# Patient Record
Sex: Female | Born: 1945
Health system: Southern US, Community
[De-identification: ages and names within clinical notes are randomized; demographics above are authoritative.]

## PROBLEM LIST (undated history)

## (undated) DIAGNOSIS — R350 Frequency of micturition: Secondary | ICD-10-CM

## (undated) DIAGNOSIS — R300 Dysuria: Secondary | ICD-10-CM

## (undated) DIAGNOSIS — R42 Dizziness and giddiness: Secondary | ICD-10-CM

## (undated) DIAGNOSIS — K219 Gastro-esophageal reflux disease without esophagitis: Secondary | ICD-10-CM

## (undated) DIAGNOSIS — K5909 Other constipation: Secondary | ICD-10-CM

## (undated) DIAGNOSIS — I889 Nonspecific lymphadenitis, unspecified: Principal | ICD-10-CM

## (undated) DIAGNOSIS — J069 Acute upper respiratory infection, unspecified: Secondary | ICD-10-CM

## (undated) DIAGNOSIS — M5136 Other intervertebral disc degeneration, lumbar region: Secondary | ICD-10-CM

## (undated) DIAGNOSIS — A318 Other mycobacterial infections: Secondary | ICD-10-CM

## (undated) DIAGNOSIS — Z973 Presence of spectacles and contact lenses: Secondary | ICD-10-CM

## (undated) DIAGNOSIS — M6748 Ganglion, other site: Secondary | ICD-10-CM

## (undated) DIAGNOSIS — R102 Pelvic and perineal pain unspecified side: Secondary | ICD-10-CM

## (undated) DIAGNOSIS — I712 Thoracic aortic aneurysm, without rupture, unspecified: Secondary | ICD-10-CM

## (undated) DIAGNOSIS — T8579XA Infection and inflammatory reaction due to other internal prosthetic devices, implants and grafts, initial encounter: Secondary | ICD-10-CM

## (undated) DIAGNOSIS — Z974 Presence of external hearing-aid: Secondary | ICD-10-CM

## (undated) DIAGNOSIS — F419 Anxiety disorder, unspecified: Secondary | ICD-10-CM

## (undated) DIAGNOSIS — M51369 Other intervertebral disc degeneration, lumbar region without mention of lumbar back pain or lower extremity pain: Secondary | ICD-10-CM

## (undated) DIAGNOSIS — J439 Emphysema, unspecified: Secondary | ICD-10-CM

## (undated) DIAGNOSIS — J410 Simple chronic bronchitis: Secondary | ICD-10-CM

## (undated) DIAGNOSIS — I10 Essential (primary) hypertension: Secondary | ICD-10-CM

## (undated) DIAGNOSIS — T792XXA Traumatic secondary and recurrent hemorrhage and seroma, initial encounter: Secondary | ICD-10-CM

## (undated) DIAGNOSIS — R3915 Urgency of urination: Secondary | ICD-10-CM

## (undated) DIAGNOSIS — T4145XA Adverse effect of unspecified anesthetic, initial encounter: Secondary | ICD-10-CM

## (undated) DIAGNOSIS — G43909 Migraine, unspecified, not intractable, without status migrainosus: Secondary | ICD-10-CM

## (undated) DIAGNOSIS — J449 Chronic obstructive pulmonary disease, unspecified: Secondary | ICD-10-CM

## (undated) DIAGNOSIS — I719 Aortic aneurysm of unspecified site, without rupture: Secondary | ICD-10-CM

## (undated) DIAGNOSIS — T8859XA Other complications of anesthesia, initial encounter: Secondary | ICD-10-CM

## (undated) DIAGNOSIS — M8580 Other specified disorders of bone density and structure, unspecified site: Secondary | ICD-10-CM

## (undated) DIAGNOSIS — M1990 Unspecified osteoarthritis, unspecified site: Secondary | ICD-10-CM

## (undated) HISTORY — DX: Nonspecific lymphadenitis, unspecified: I88.9

## (undated) HISTORY — DX: Acute upper respiratory infection, unspecified: J06.9

## (undated) HISTORY — DX: Other specified disorders of bone density and structure, unspecified site: M85.80

## (undated) HISTORY — PX: HERNIA REPAIR: SHX51

## (undated) HISTORY — DX: Chronic obstructive pulmonary disease, unspecified: J44.9

## (undated) HISTORY — DX: Anxiety disorder, unspecified: F41.9

## (undated) HISTORY — DX: Other intervertebral disc degeneration, lumbar region without mention of lumbar back pain or lower extremity pain: M51.369

## (undated) HISTORY — DX: Other intervertebral disc degeneration, lumbar region: M51.36

## (undated) HISTORY — PX: BREAST BIOPSY: SHX20

## (undated) HISTORY — PX: EYE SURGERY: SHX253

## (undated) HISTORY — PX: COLONOSCOPY: SHX174

## (undated) HISTORY — PX: OTHER SURGICAL HISTORY: SHX169

## (undated) HISTORY — DX: Ganglion, other site: M67.48

## (undated) HISTORY — DX: Unspecified osteoarthritis, unspecified site: M19.90

## (undated) HISTORY — DX: Gastro-esophageal reflux disease without esophagitis: K21.9

## (undated) HISTORY — DX: Essential (primary) hypertension: I10

## (undated) HISTORY — DX: Aortic aneurysm of unspecified site, without rupture: I71.9

## (undated) HISTORY — DX: Other mycobacterial infections: A31.8

---
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
## 1980-08-04 HISTORY — PX: ABDOMINAL HYSTERECTOMY: SHX81

## 1982-08-04 HISTORY — PX: OTHER SURGICAL HISTORY: SHX169

## 1985-08-04 HISTORY — PX: CHOLECYSTECTOMY OPEN: SUR202

## 1991-02-18 DIAGNOSIS — L570 Actinic keratosis: Secondary | ICD-10-CM

## 1991-02-18 HISTORY — DX: Actinic keratosis: L57.0

## 1995-04-07 DIAGNOSIS — C4491 Basal cell carcinoma of skin, unspecified: Secondary | ICD-10-CM

## 1995-04-07 HISTORY — DX: Basal cell carcinoma of skin, unspecified: C44.91

## 2000-05-26 ENCOUNTER — Encounter: Payer: Self-pay | Admitting: Family Medicine

## 2000-05-26 ENCOUNTER — Encounter: Admission: RE | Admit: 2000-05-26 | Discharge: 2000-05-26 | Payer: Self-pay | Admitting: Family Medicine

## 2000-10-07 ENCOUNTER — Encounter: Admission: RE | Admit: 2000-10-07 | Discharge: 2000-10-28 | Payer: Self-pay | Admitting: Internal Medicine

## 2000-10-16 ENCOUNTER — Ambulatory Visit (HOSPITAL_COMMUNITY): Admission: RE | Admit: 2000-10-16 | Discharge: 2000-10-16 | Payer: Self-pay | Admitting: Gastroenterology

## 2001-05-28 ENCOUNTER — Encounter: Payer: Self-pay | Admitting: Internal Medicine

## 2001-05-28 ENCOUNTER — Encounter: Admission: RE | Admit: 2001-05-28 | Discharge: 2001-05-28 | Payer: Self-pay | Admitting: Internal Medicine

## 2001-08-04 HISTORY — PX: ROTATOR CUFF REPAIR: SHX139

## 2002-05-13 ENCOUNTER — Encounter: Admission: RE | Admit: 2002-05-13 | Discharge: 2002-05-13 | Payer: Self-pay | Admitting: Internal Medicine

## 2002-05-13 ENCOUNTER — Encounter: Payer: Self-pay | Admitting: Internal Medicine

## 2002-06-16 ENCOUNTER — Encounter: Payer: Self-pay | Admitting: Internal Medicine

## 2002-06-16 ENCOUNTER — Encounter: Admission: RE | Admit: 2002-06-16 | Discharge: 2002-06-16 | Payer: Self-pay | Admitting: Internal Medicine

## 2003-04-24 ENCOUNTER — Encounter: Payer: Self-pay | Admitting: Internal Medicine

## 2003-04-24 ENCOUNTER — Encounter: Admission: RE | Admit: 2003-04-24 | Discharge: 2003-04-24 | Payer: Self-pay | Admitting: Internal Medicine

## 2003-08-29 ENCOUNTER — Encounter: Admission: RE | Admit: 2003-08-29 | Discharge: 2003-08-29 | Payer: Self-pay | Admitting: Internal Medicine

## 2003-12-14 ENCOUNTER — Encounter: Admission: RE | Admit: 2003-12-14 | Discharge: 2003-12-14 | Payer: Self-pay | Admitting: Gastroenterology

## 2004-09-26 ENCOUNTER — Encounter: Admission: RE | Admit: 2004-09-26 | Discharge: 2004-09-26 | Payer: Self-pay | Admitting: Internal Medicine

## 2005-10-30 ENCOUNTER — Encounter: Admission: RE | Admit: 2005-10-30 | Discharge: 2005-10-30 | Payer: Self-pay | Admitting: Emergency Medicine

## 2006-12-10 ENCOUNTER — Encounter: Admission: RE | Admit: 2006-12-10 | Discharge: 2006-12-10 | Payer: Self-pay | Admitting: Emergency Medicine

## 2007-12-13 ENCOUNTER — Encounter: Admission: RE | Admit: 2007-12-13 | Discharge: 2007-12-13 | Payer: Self-pay | Admitting: Emergency Medicine

## 2009-11-12 ENCOUNTER — Encounter: Admission: RE | Admit: 2009-11-12 | Discharge: 2009-11-12 | Payer: Self-pay | Admitting: Family Medicine

## 2009-12-04 DIAGNOSIS — D239 Other benign neoplasm of skin, unspecified: Secondary | ICD-10-CM

## 2009-12-04 HISTORY — DX: Other benign neoplasm of skin, unspecified: D23.9

## 2010-08-08 ENCOUNTER — Encounter
Admission: RE | Admit: 2010-08-08 | Discharge: 2010-08-08 | Payer: Self-pay | Source: Home / Self Care | Attending: Family Medicine | Admitting: Family Medicine

## 2010-08-26 ENCOUNTER — Encounter: Payer: Self-pay | Admitting: Family Medicine

## 2010-12-20 NOTE — Procedures (Signed)
[facility]  Patient:    CHIDERA, DEARCOS                       MRN: [id number] Proc. Date: 10/16/00 Adm. Date:  [id number] Attending:  Rich Brave CC:         Lilyan Punt. Sydnee Levans, M.D.   Procedure Report  PROCEDURE:  Colonoscopy.  INDICATIONS FOR PROCEDURE:  Screening for colon cancer in a 65 year old female.  FINDINGS:  Scattered left sided diverticulosis. Otherwise normal exam.  DESCRIPTION OF PROCEDURE:  The nature, purpose and risk of the procedure had been reviewed with the patient and she provided written consent. The Olympus adult video colonoscope was advanced with some difficulty to the cecum, primarily because of the formation of a loop about the time we were in the mid transverse colonic region. Initial attempts to get the loop out were unsuccessful but once we accomplished that, the advancement was quite easily accomplished to the cecum as identified by clear visualization of the appendiceal orifice. Pullback was then performed. There was a little bit of adherent stool film in the proximal colon but it is not felt that this would have obscured any major lesions.  There was no evidence of polyps, cancer, or colitis or vascular malformations on this exam but there was some left sided diverticulosis. Retroflexion of the rectum was normal. No biopsies were obtained. The patient tolerated the procedure well and there were no apparent complications.  IMPRESSION:  Left sided diverticulosis, otherwise normal screening colonoscopy.  PLAN:  Flexible sigmoidoscopy for screening purposes in five years, followed perhaps by a full colonoscopy five years thereafter. DD:  10/16/00 TD:  10/16/00 Job: [id number]

## 2011-01-16 ENCOUNTER — Other Ambulatory Visit: Payer: Self-pay | Admitting: Family Medicine

## 2011-01-16 DIAGNOSIS — Z1231 Encounter for screening mammogram for malignant neoplasm of breast: Secondary | ICD-10-CM

## 2011-01-23 ENCOUNTER — Ambulatory Visit
Admission: RE | Admit: 2011-01-23 | Discharge: 2011-01-23 | Disposition: A | Discharge status: Home / Self Care | Payer: Medicare Other | Source: Ambulatory Visit | Attending: Family Medicine | Admitting: Family Medicine

## 2011-01-23 DIAGNOSIS — Z1231 Encounter for screening mammogram for malignant neoplasm of breast: Secondary | ICD-10-CM

## 2011-02-17 ENCOUNTER — Other Ambulatory Visit: Payer: Self-pay | Admitting: Family Medicine

## 2011-02-17 DIAGNOSIS — R748 Abnormal levels of other serum enzymes: Secondary | ICD-10-CM

## 2011-02-21 ENCOUNTER — Ambulatory Visit
Admission: RE | Admit: 2011-02-21 | Discharge: 2011-02-21 | Disposition: A | Discharge status: Home / Self Care | Payer: Medicare Other | Source: Ambulatory Visit | Attending: Family Medicine | Admitting: Family Medicine

## 2011-02-21 DIAGNOSIS — R748 Abnormal levels of other serum enzymes: Secondary | ICD-10-CM

## 2011-03-25 ENCOUNTER — Ambulatory Visit: Payer: Medicare Other

## 2011-03-31 ENCOUNTER — Ambulatory Visit: Payer: Medicare Other | Attending: Family Medicine

## 2011-03-31 DIAGNOSIS — M6281 Muscle weakness (generalized): Secondary | ICD-10-CM | POA: Insufficient documentation

## 2011-03-31 DIAGNOSIS — R5381 Other malaise: Secondary | ICD-10-CM | POA: Insufficient documentation

## 2011-03-31 DIAGNOSIS — M545 Low back pain, unspecified: Secondary | ICD-10-CM | POA: Insufficient documentation

## 2011-03-31 DIAGNOSIS — IMO0001 Reserved for inherently not codable concepts without codable children: Secondary | ICD-10-CM | POA: Insufficient documentation

## 2011-04-02 ENCOUNTER — Encounter: Payer: Medicare Other | Admitting: Physical Therapy

## 2011-04-08 ENCOUNTER — Encounter: Payer: Medicare Other | Admitting: Physical Therapy

## 2011-04-10 ENCOUNTER — Ambulatory Visit: Payer: Medicare Other | Attending: Family Medicine

## 2011-04-10 DIAGNOSIS — M545 Low back pain, unspecified: Secondary | ICD-10-CM | POA: Insufficient documentation

## 2011-04-10 DIAGNOSIS — R5381 Other malaise: Secondary | ICD-10-CM | POA: Insufficient documentation

## 2011-04-10 DIAGNOSIS — M6281 Muscle weakness (generalized): Secondary | ICD-10-CM | POA: Insufficient documentation

## 2011-04-10 DIAGNOSIS — IMO0001 Reserved for inherently not codable concepts without codable children: Secondary | ICD-10-CM | POA: Insufficient documentation

## 2011-04-15 ENCOUNTER — Ambulatory Visit: Payer: Medicare Other

## 2011-04-17 ENCOUNTER — Ambulatory Visit: Payer: Medicare Other

## 2011-08-28 DIAGNOSIS — IMO0002 Reserved for concepts with insufficient information to code with codable children: Secondary | ICD-10-CM | POA: Diagnosis not present

## 2011-10-03 DIAGNOSIS — Z1211 Encounter for screening for malignant neoplasm of colon: Secondary | ICD-10-CM | POA: Diagnosis not present

## 2011-10-03 DIAGNOSIS — K219 Gastro-esophageal reflux disease without esophagitis: Secondary | ICD-10-CM | POA: Diagnosis not present

## 2011-10-03 DIAGNOSIS — R141 Gas pain: Secondary | ICD-10-CM | POA: Diagnosis not present

## 2011-10-03 DIAGNOSIS — K5901 Slow transit constipation: Secondary | ICD-10-CM | POA: Diagnosis not present

## 2011-10-03 DIAGNOSIS — K625 Hemorrhage of anus and rectum: Secondary | ICD-10-CM | POA: Diagnosis not present

## 2011-10-09 DIAGNOSIS — F411 Generalized anxiety disorder: Secondary | ICD-10-CM | POA: Diagnosis not present

## 2011-10-09 DIAGNOSIS — J209 Acute bronchitis, unspecified: Secondary | ICD-10-CM | POA: Diagnosis not present

## 2011-11-19 DIAGNOSIS — J029 Acute pharyngitis, unspecified: Secondary | ICD-10-CM | POA: Diagnosis not present

## 2011-12-10 DIAGNOSIS — K5730 Diverticulosis of large intestine without perforation or abscess without bleeding: Secondary | ICD-10-CM | POA: Diagnosis not present

## 2011-12-10 DIAGNOSIS — D126 Benign neoplasm of colon, unspecified: Secondary | ICD-10-CM | POA: Diagnosis not present

## 2011-12-10 DIAGNOSIS — K625 Hemorrhage of anus and rectum: Secondary | ICD-10-CM | POA: Diagnosis not present

## 2011-12-19 DIAGNOSIS — H6120 Impacted cerumen, unspecified ear: Secondary | ICD-10-CM | POA: Diagnosis not present

## 2011-12-19 DIAGNOSIS — R42 Dizziness and giddiness: Secondary | ICD-10-CM | POA: Diagnosis not present

## 2011-12-22 ENCOUNTER — Other Ambulatory Visit: Payer: Self-pay | Admitting: Family Medicine

## 2011-12-22 DIAGNOSIS — Z1231 Encounter for screening mammogram for malignant neoplasm of breast: Secondary | ICD-10-CM

## 2012-01-12 DIAGNOSIS — M549 Dorsalgia, unspecified: Secondary | ICD-10-CM | POA: Diagnosis not present

## 2012-01-12 DIAGNOSIS — M5137 Other intervertebral disc degeneration, lumbosacral region: Secondary | ICD-10-CM | POA: Diagnosis not present

## 2012-01-26 ENCOUNTER — Ambulatory Visit: Payer: Medicare Other

## 2012-01-29 ENCOUNTER — Other Ambulatory Visit: Payer: Self-pay | Admitting: Family Medicine

## 2012-01-29 ENCOUNTER — Ambulatory Visit
Admission: RE | Admit: 2012-01-29 | Discharge: 2012-01-29 | Disposition: A | Discharge status: Home / Self Care | Payer: Medicare Other | Source: Ambulatory Visit | Attending: Family Medicine | Admitting: Family Medicine

## 2012-01-29 DIAGNOSIS — Z1231 Encounter for screening mammogram for malignant neoplasm of breast: Secondary | ICD-10-CM

## 2012-01-29 DIAGNOSIS — N63 Unspecified lump in unspecified breast: Secondary | ICD-10-CM

## 2012-02-09 ENCOUNTER — Ambulatory Visit
Admission: RE | Admit: 2012-02-09 | Discharge: 2012-02-09 | Disposition: A | Discharge status: Home / Self Care | Payer: Medicare Other | Source: Ambulatory Visit | Attending: Family Medicine | Admitting: Family Medicine

## 2012-02-09 DIAGNOSIS — N63 Unspecified lump in unspecified breast: Secondary | ICD-10-CM

## 2012-02-09 DIAGNOSIS — N644 Mastodynia: Secondary | ICD-10-CM | POA: Diagnosis not present

## 2012-02-16 DIAGNOSIS — M25549 Pain in joints of unspecified hand: Secondary | ICD-10-CM | POA: Diagnosis not present

## 2012-02-16 DIAGNOSIS — M25579 Pain in unspecified ankle and joints of unspecified foot: Secondary | ICD-10-CM | POA: Diagnosis not present

## 2012-03-02 DIAGNOSIS — Z0000 Encounter for general adult medical examination without abnormal findings: Secondary | ICD-10-CM | POA: Diagnosis not present

## 2012-03-02 DIAGNOSIS — Z79899 Other long term (current) drug therapy: Secondary | ICD-10-CM | POA: Diagnosis not present

## 2012-03-02 DIAGNOSIS — J309 Allergic rhinitis, unspecified: Secondary | ICD-10-CM | POA: Diagnosis not present

## 2012-03-02 DIAGNOSIS — I10 Essential (primary) hypertension: Secondary | ICD-10-CM | POA: Diagnosis not present

## 2012-03-02 DIAGNOSIS — M949 Disorder of cartilage, unspecified: Secondary | ICD-10-CM | POA: Diagnosis not present

## 2012-03-02 DIAGNOSIS — M899 Disorder of bone, unspecified: Secondary | ICD-10-CM | POA: Diagnosis not present

## 2012-03-02 DIAGNOSIS — J45901 Unspecified asthma with (acute) exacerbation: Secondary | ICD-10-CM | POA: Diagnosis not present

## 2012-03-02 DIAGNOSIS — E559 Vitamin D deficiency, unspecified: Secondary | ICD-10-CM | POA: Diagnosis not present

## 2012-03-05 DIAGNOSIS — M12279 Villonodular synovitis (pigmented), unspecified ankle and foot: Secondary | ICD-10-CM | POA: Diagnosis not present

## 2012-03-05 DIAGNOSIS — M65979 Unspecified synovitis and tenosynovitis, unspecified ankle and foot: Secondary | ICD-10-CM | POA: Diagnosis not present

## 2012-03-05 DIAGNOSIS — M659 Synovitis and tenosynovitis, unspecified: Secondary | ICD-10-CM | POA: Diagnosis not present

## 2012-03-12 DIAGNOSIS — M12279 Villonodular synovitis (pigmented), unspecified ankle and foot: Secondary | ICD-10-CM | POA: Diagnosis not present

## 2012-03-12 DIAGNOSIS — M65979 Unspecified synovitis and tenosynovitis, unspecified ankle and foot: Secondary | ICD-10-CM | POA: Diagnosis not present

## 2012-03-12 DIAGNOSIS — M659 Synovitis and tenosynovitis, unspecified: Secondary | ICD-10-CM | POA: Diagnosis not present

## 2012-03-30 DIAGNOSIS — R079 Chest pain, unspecified: Secondary | ICD-10-CM | POA: Diagnosis not present

## 2012-04-20 DIAGNOSIS — Z23 Encounter for immunization: Secondary | ICD-10-CM | POA: Diagnosis not present

## 2012-05-04 DIAGNOSIS — K219 Gastro-esophageal reflux disease without esophagitis: Secondary | ICD-10-CM | POA: Diagnosis not present

## 2012-05-04 DIAGNOSIS — R072 Precordial pain: Secondary | ICD-10-CM | POA: Diagnosis not present

## 2012-05-04 DIAGNOSIS — Z86010 Personal history of colonic polyps: Secondary | ICD-10-CM | POA: Diagnosis not present

## 2012-06-17 DIAGNOSIS — K219 Gastro-esophageal reflux disease without esophagitis: Secondary | ICD-10-CM | POA: Diagnosis not present

## 2012-06-17 DIAGNOSIS — J329 Chronic sinusitis, unspecified: Secondary | ICD-10-CM | POA: Diagnosis not present

## 2012-06-17 DIAGNOSIS — G479 Sleep disorder, unspecified: Secondary | ICD-10-CM | POA: Diagnosis not present

## 2012-07-07 DIAGNOSIS — M25549 Pain in joints of unspecified hand: Secondary | ICD-10-CM | POA: Diagnosis not present

## 2012-07-07 DIAGNOSIS — M25519 Pain in unspecified shoulder: Secondary | ICD-10-CM | POA: Diagnosis not present

## 2012-07-10 DIAGNOSIS — J069 Acute upper respiratory infection, unspecified: Secondary | ICD-10-CM | POA: Diagnosis not present

## 2012-08-02 DIAGNOSIS — M545 Low back pain, unspecified: Secondary | ICD-10-CM | POA: Diagnosis not present

## 2012-08-13 ENCOUNTER — Other Ambulatory Visit: Payer: Self-pay | Admitting: Family Medicine

## 2012-08-13 DIAGNOSIS — M47817 Spondylosis without myelopathy or radiculopathy, lumbosacral region: Secondary | ICD-10-CM | POA: Diagnosis not present

## 2012-08-13 DIAGNOSIS — N951 Menopausal and female climacteric states: Secondary | ICD-10-CM

## 2012-08-20 ENCOUNTER — Ambulatory Visit
Admission: RE | Admit: 2012-08-20 | Discharge: 2012-08-20 | Disposition: A | Discharge status: Home / Self Care | Payer: Medicare Other | Source: Ambulatory Visit | Attending: Family Medicine | Admitting: Family Medicine

## 2012-08-20 DIAGNOSIS — M949 Disorder of cartilage, unspecified: Secondary | ICD-10-CM | POA: Diagnosis not present

## 2012-08-20 DIAGNOSIS — M899 Disorder of bone, unspecified: Secondary | ICD-10-CM | POA: Diagnosis not present

## 2012-08-20 DIAGNOSIS — N951 Menopausal and female climacteric states: Secondary | ICD-10-CM

## 2012-08-23 DIAGNOSIS — M545 Low back pain, unspecified: Secondary | ICD-10-CM | POA: Diagnosis not present

## 2012-08-30 DIAGNOSIS — H6120 Impacted cerumen, unspecified ear: Secondary | ICD-10-CM | POA: Diagnosis not present

## 2012-08-30 DIAGNOSIS — F17200 Nicotine dependence, unspecified, uncomplicated: Secondary | ICD-10-CM | POA: Diagnosis not present

## 2012-08-30 DIAGNOSIS — E559 Vitamin D deficiency, unspecified: Secondary | ICD-10-CM | POA: Diagnosis not present

## 2012-08-30 DIAGNOSIS — K219 Gastro-esophageal reflux disease without esophagitis: Secondary | ICD-10-CM | POA: Diagnosis not present

## 2012-08-30 DIAGNOSIS — J449 Chronic obstructive pulmonary disease, unspecified: Secondary | ICD-10-CM | POA: Diagnosis not present

## 2012-08-30 DIAGNOSIS — J309 Allergic rhinitis, unspecified: Secondary | ICD-10-CM | POA: Diagnosis not present

## 2012-09-22 DIAGNOSIS — R6889 Other general symptoms and signs: Secondary | ICD-10-CM | POA: Diagnosis not present

## 2012-10-06 DIAGNOSIS — J40 Bronchitis, not specified as acute or chronic: Secondary | ICD-10-CM | POA: Diagnosis not present

## 2012-10-27 DIAGNOSIS — G479 Sleep disorder, unspecified: Secondary | ICD-10-CM | POA: Diagnosis not present

## 2012-10-27 DIAGNOSIS — F411 Generalized anxiety disorder: Secondary | ICD-10-CM | POA: Diagnosis not present

## 2012-10-27 DIAGNOSIS — F17200 Nicotine dependence, unspecified, uncomplicated: Secondary | ICD-10-CM | POA: Diagnosis not present

## 2012-10-27 DIAGNOSIS — J209 Acute bronchitis, unspecified: Secondary | ICD-10-CM | POA: Diagnosis not present

## 2012-11-04 DIAGNOSIS — M7750 Other enthesopathy of unspecified foot: Secondary | ICD-10-CM | POA: Diagnosis not present

## 2012-11-04 DIAGNOSIS — M79609 Pain in unspecified limb: Secondary | ICD-10-CM | POA: Diagnosis not present

## 2012-11-20 DIAGNOSIS — L259 Unspecified contact dermatitis, unspecified cause: Secondary | ICD-10-CM | POA: Diagnosis not present

## 2012-12-03 DIAGNOSIS — L739 Follicular disorder, unspecified: Secondary | ICD-10-CM | POA: Diagnosis not present

## 2012-12-03 DIAGNOSIS — D485 Neoplasm of uncertain behavior of skin: Secondary | ICD-10-CM | POA: Diagnosis not present

## 2012-12-03 DIAGNOSIS — D239 Other benign neoplasm of skin, unspecified: Secondary | ICD-10-CM | POA: Diagnosis not present

## 2012-12-03 DIAGNOSIS — L820 Inflamed seborrheic keratosis: Secondary | ICD-10-CM | POA: Diagnosis not present

## 2012-12-06 DIAGNOSIS — M25579 Pain in unspecified ankle and joints of unspecified foot: Secondary | ICD-10-CM | POA: Diagnosis not present

## 2012-12-09 DIAGNOSIS — M25579 Pain in unspecified ankle and joints of unspecified foot: Secondary | ICD-10-CM | POA: Diagnosis not present

## 2012-12-16 DIAGNOSIS — M25579 Pain in unspecified ankle and joints of unspecified foot: Secondary | ICD-10-CM | POA: Diagnosis not present

## 2012-12-16 DIAGNOSIS — M545 Low back pain, unspecified: Secondary | ICD-10-CM | POA: Diagnosis not present

## 2012-12-23 DIAGNOSIS — M545 Low back pain, unspecified: Secondary | ICD-10-CM | POA: Diagnosis not present

## 2012-12-23 DIAGNOSIS — M25579 Pain in unspecified ankle and joints of unspecified foot: Secondary | ICD-10-CM | POA: Diagnosis not present

## 2012-12-30 DIAGNOSIS — J441 Chronic obstructive pulmonary disease with (acute) exacerbation: Secondary | ICD-10-CM | POA: Diagnosis not present

## 2012-12-30 DIAGNOSIS — J45909 Unspecified asthma, uncomplicated: Secondary | ICD-10-CM | POA: Diagnosis not present

## 2013-01-06 ENCOUNTER — Other Ambulatory Visit: Payer: Self-pay

## 2013-01-06 DIAGNOSIS — Z1231 Encounter for screening mammogram for malignant neoplasm of breast: Secondary | ICD-10-CM

## 2013-01-06 DIAGNOSIS — M25579 Pain in unspecified ankle and joints of unspecified foot: Secondary | ICD-10-CM | POA: Diagnosis not present

## 2013-01-07 DIAGNOSIS — G479 Sleep disorder, unspecified: Secondary | ICD-10-CM | POA: Diagnosis not present

## 2013-01-07 DIAGNOSIS — F17200 Nicotine dependence, unspecified, uncomplicated: Secondary | ICD-10-CM | POA: Diagnosis not present

## 2013-01-07 DIAGNOSIS — J449 Chronic obstructive pulmonary disease, unspecified: Secondary | ICD-10-CM | POA: Diagnosis not present

## 2013-01-07 DIAGNOSIS — F411 Generalized anxiety disorder: Secondary | ICD-10-CM | POA: Diagnosis not present

## 2013-02-10 ENCOUNTER — Ambulatory Visit: Payer: Medicare Other

## 2013-02-11 DIAGNOSIS — N390 Urinary tract infection, site not specified: Secondary | ICD-10-CM | POA: Diagnosis not present

## 2013-02-11 DIAGNOSIS — R350 Frequency of micturition: Secondary | ICD-10-CM | POA: Diagnosis not present

## 2013-02-18 DIAGNOSIS — R300 Dysuria: Secondary | ICD-10-CM | POA: Diagnosis not present

## 2013-02-23 DIAGNOSIS — N3020 Other chronic cystitis without hematuria: Secondary | ICD-10-CM | POA: Diagnosis not present

## 2013-02-23 DIAGNOSIS — R300 Dysuria: Secondary | ICD-10-CM | POA: Diagnosis not present

## 2013-02-23 DIAGNOSIS — N949 Unspecified condition associated with female genital organs and menstrual cycle: Secondary | ICD-10-CM | POA: Diagnosis not present

## 2013-02-24 ENCOUNTER — Ambulatory Visit
Admission: RE | Admit: 2013-02-24 | Discharge: 2013-02-24 | Disposition: A | Discharge status: Home / Self Care | Payer: Medicare Other | Source: Ambulatory Visit

## 2013-02-24 DIAGNOSIS — Z1231 Encounter for screening mammogram for malignant neoplasm of breast: Secondary | ICD-10-CM | POA: Diagnosis not present

## 2013-03-06 DIAGNOSIS — R05 Cough: Secondary | ICD-10-CM | POA: Diagnosis not present

## 2013-03-06 DIAGNOSIS — R059 Cough, unspecified: Secondary | ICD-10-CM | POA: Diagnosis not present

## 2013-03-14 DIAGNOSIS — Z0000 Encounter for general adult medical examination without abnormal findings: Secondary | ICD-10-CM | POA: Diagnosis not present

## 2013-03-14 DIAGNOSIS — N760 Acute vaginitis: Secondary | ICD-10-CM | POA: Diagnosis not present

## 2013-03-14 DIAGNOSIS — E559 Vitamin D deficiency, unspecified: Secondary | ICD-10-CM | POA: Diagnosis not present

## 2013-03-14 DIAGNOSIS — I10 Essential (primary) hypertension: Secondary | ICD-10-CM | POA: Diagnosis not present

## 2013-03-14 DIAGNOSIS — Z79899 Other long term (current) drug therapy: Secondary | ICD-10-CM | POA: Diagnosis not present

## 2013-04-13 DIAGNOSIS — M5030 Other cervical disc degeneration, unspecified cervical region: Secondary | ICD-10-CM | POA: Diagnosis not present

## 2013-04-13 DIAGNOSIS — M542 Cervicalgia: Secondary | ICD-10-CM | POA: Diagnosis not present

## 2013-04-13 DIAGNOSIS — M9981 Other biomechanical lesions of cervical region: Secondary | ICD-10-CM | POA: Diagnosis not present

## 2013-04-13 DIAGNOSIS — M5020 Other cervical disc displacement, unspecified cervical region: Secondary | ICD-10-CM | POA: Diagnosis not present

## 2013-04-14 DIAGNOSIS — M9981 Other biomechanical lesions of cervical region: Secondary | ICD-10-CM | POA: Diagnosis not present

## 2013-04-14 DIAGNOSIS — M542 Cervicalgia: Secondary | ICD-10-CM | POA: Diagnosis not present

## 2013-04-14 DIAGNOSIS — M5030 Other cervical disc degeneration, unspecified cervical region: Secondary | ICD-10-CM | POA: Diagnosis not present

## 2013-04-14 DIAGNOSIS — M5020 Other cervical disc displacement, unspecified cervical region: Secondary | ICD-10-CM | POA: Diagnosis not present

## 2013-04-15 DIAGNOSIS — M542 Cervicalgia: Secondary | ICD-10-CM | POA: Diagnosis not present

## 2013-04-15 DIAGNOSIS — M5020 Other cervical disc displacement, unspecified cervical region: Secondary | ICD-10-CM | POA: Diagnosis not present

## 2013-04-15 DIAGNOSIS — M5030 Other cervical disc degeneration, unspecified cervical region: Secondary | ICD-10-CM | POA: Diagnosis not present

## 2013-04-15 DIAGNOSIS — M9981 Other biomechanical lesions of cervical region: Secondary | ICD-10-CM | POA: Diagnosis not present

## 2013-04-18 DIAGNOSIS — M5020 Other cervical disc displacement, unspecified cervical region: Secondary | ICD-10-CM | POA: Diagnosis not present

## 2013-04-18 DIAGNOSIS — M9981 Other biomechanical lesions of cervical region: Secondary | ICD-10-CM | POA: Diagnosis not present

## 2013-04-18 DIAGNOSIS — M542 Cervicalgia: Secondary | ICD-10-CM | POA: Diagnosis not present

## 2013-04-18 DIAGNOSIS — M5030 Other cervical disc degeneration, unspecified cervical region: Secondary | ICD-10-CM | POA: Diagnosis not present

## 2013-04-25 DIAGNOSIS — M542 Cervicalgia: Secondary | ICD-10-CM | POA: Diagnosis not present

## 2013-04-25 DIAGNOSIS — M5030 Other cervical disc degeneration, unspecified cervical region: Secondary | ICD-10-CM | POA: Diagnosis not present

## 2013-04-25 DIAGNOSIS — M9981 Other biomechanical lesions of cervical region: Secondary | ICD-10-CM | POA: Diagnosis not present

## 2013-04-25 DIAGNOSIS — M5020 Other cervical disc displacement, unspecified cervical region: Secondary | ICD-10-CM | POA: Diagnosis not present

## 2013-04-28 DIAGNOSIS — M5030 Other cervical disc degeneration, unspecified cervical region: Secondary | ICD-10-CM | POA: Diagnosis not present

## 2013-04-28 DIAGNOSIS — M5020 Other cervical disc displacement, unspecified cervical region: Secondary | ICD-10-CM | POA: Diagnosis not present

## 2013-04-28 DIAGNOSIS — M542 Cervicalgia: Secondary | ICD-10-CM | POA: Diagnosis not present

## 2013-04-28 DIAGNOSIS — M9981 Other biomechanical lesions of cervical region: Secondary | ICD-10-CM | POA: Diagnosis not present

## 2013-05-11 DIAGNOSIS — J309 Allergic rhinitis, unspecified: Secondary | ICD-10-CM | POA: Diagnosis not present

## 2013-05-11 DIAGNOSIS — J45901 Unspecified asthma with (acute) exacerbation: Secondary | ICD-10-CM | POA: Diagnosis not present

## 2013-05-13 DIAGNOSIS — Z23 Encounter for immunization: Secondary | ICD-10-CM | POA: Diagnosis not present

## 2013-05-19 DIAGNOSIS — C4441 Basal cell carcinoma of skin of scalp and neck: Secondary | ICD-10-CM | POA: Diagnosis not present

## 2013-06-01 ENCOUNTER — Ambulatory Visit: Payer: Self-pay | Admitting: Podiatry

## 2013-06-13 DIAGNOSIS — M999 Biomechanical lesion, unspecified: Secondary | ICD-10-CM | POA: Diagnosis not present

## 2013-06-13 DIAGNOSIS — M9981 Other biomechanical lesions of cervical region: Secondary | ICD-10-CM | POA: Diagnosis not present

## 2013-06-13 DIAGNOSIS — M5030 Other cervical disc degeneration, unspecified cervical region: Secondary | ICD-10-CM | POA: Diagnosis not present

## 2013-06-13 DIAGNOSIS — M47814 Spondylosis without myelopathy or radiculopathy, thoracic region: Secondary | ICD-10-CM | POA: Diagnosis not present

## 2013-06-13 DIAGNOSIS — M5126 Other intervertebral disc displacement, lumbar region: Secondary | ICD-10-CM | POA: Diagnosis not present

## 2013-06-14 DIAGNOSIS — K5901 Slow transit constipation: Secondary | ICD-10-CM | POA: Diagnosis not present

## 2013-06-14 DIAGNOSIS — R198 Other specified symptoms and signs involving the digestive system and abdomen: Secondary | ICD-10-CM | POA: Diagnosis not present

## 2013-06-15 DIAGNOSIS — M5030 Other cervical disc degeneration, unspecified cervical region: Secondary | ICD-10-CM | POA: Diagnosis not present

## 2013-06-15 DIAGNOSIS — M9981 Other biomechanical lesions of cervical region: Secondary | ICD-10-CM | POA: Diagnosis not present

## 2013-06-15 DIAGNOSIS — M999 Biomechanical lesion, unspecified: Secondary | ICD-10-CM | POA: Diagnosis not present

## 2013-06-15 DIAGNOSIS — M47814 Spondylosis without myelopathy or radiculopathy, thoracic region: Secondary | ICD-10-CM | POA: Diagnosis not present

## 2013-06-15 DIAGNOSIS — M5126 Other intervertebral disc displacement, lumbar region: Secondary | ICD-10-CM | POA: Diagnosis not present

## 2013-06-17 DIAGNOSIS — M47814 Spondylosis without myelopathy or radiculopathy, thoracic region: Secondary | ICD-10-CM | POA: Diagnosis not present

## 2013-06-17 DIAGNOSIS — M5030 Other cervical disc degeneration, unspecified cervical region: Secondary | ICD-10-CM | POA: Diagnosis not present

## 2013-06-17 DIAGNOSIS — M5126 Other intervertebral disc displacement, lumbar region: Secondary | ICD-10-CM | POA: Diagnosis not present

## 2013-06-17 DIAGNOSIS — M9981 Other biomechanical lesions of cervical region: Secondary | ICD-10-CM | POA: Diagnosis not present

## 2013-06-17 DIAGNOSIS — I10 Essential (primary) hypertension: Secondary | ICD-10-CM | POA: Diagnosis not present

## 2013-06-17 DIAGNOSIS — M999 Biomechanical lesion, unspecified: Secondary | ICD-10-CM | POA: Diagnosis not present

## 2013-06-20 DIAGNOSIS — M999 Biomechanical lesion, unspecified: Secondary | ICD-10-CM | POA: Diagnosis not present

## 2013-06-20 DIAGNOSIS — M5126 Other intervertebral disc displacement, lumbar region: Secondary | ICD-10-CM | POA: Diagnosis not present

## 2013-06-20 DIAGNOSIS — M9981 Other biomechanical lesions of cervical region: Secondary | ICD-10-CM | POA: Diagnosis not present

## 2013-06-20 DIAGNOSIS — M5030 Other cervical disc degeneration, unspecified cervical region: Secondary | ICD-10-CM | POA: Diagnosis not present

## 2013-06-20 DIAGNOSIS — M47814 Spondylosis without myelopathy or radiculopathy, thoracic region: Secondary | ICD-10-CM | POA: Diagnosis not present

## 2013-06-23 DIAGNOSIS — M9981 Other biomechanical lesions of cervical region: Secondary | ICD-10-CM | POA: Diagnosis not present

## 2013-06-23 DIAGNOSIS — M47814 Spondylosis without myelopathy or radiculopathy, thoracic region: Secondary | ICD-10-CM | POA: Diagnosis not present

## 2013-06-23 DIAGNOSIS — M5126 Other intervertebral disc displacement, lumbar region: Secondary | ICD-10-CM | POA: Diagnosis not present

## 2013-06-23 DIAGNOSIS — M999 Biomechanical lesion, unspecified: Secondary | ICD-10-CM | POA: Diagnosis not present

## 2013-06-23 DIAGNOSIS — M5030 Other cervical disc degeneration, unspecified cervical region: Secondary | ICD-10-CM | POA: Diagnosis not present

## 2013-06-27 DIAGNOSIS — M999 Biomechanical lesion, unspecified: Secondary | ICD-10-CM | POA: Diagnosis not present

## 2013-06-27 DIAGNOSIS — M9981 Other biomechanical lesions of cervical region: Secondary | ICD-10-CM | POA: Diagnosis not present

## 2013-06-27 DIAGNOSIS — M5030 Other cervical disc degeneration, unspecified cervical region: Secondary | ICD-10-CM | POA: Diagnosis not present

## 2013-06-27 DIAGNOSIS — M5126 Other intervertebral disc displacement, lumbar region: Secondary | ICD-10-CM | POA: Diagnosis not present

## 2013-06-27 DIAGNOSIS — M47814 Spondylosis without myelopathy or radiculopathy, thoracic region: Secondary | ICD-10-CM | POA: Diagnosis not present

## 2013-07-01 DIAGNOSIS — M9981 Other biomechanical lesions of cervical region: Secondary | ICD-10-CM | POA: Diagnosis not present

## 2013-07-01 DIAGNOSIS — M5126 Other intervertebral disc displacement, lumbar region: Secondary | ICD-10-CM | POA: Diagnosis not present

## 2013-07-01 DIAGNOSIS — M47814 Spondylosis without myelopathy or radiculopathy, thoracic region: Secondary | ICD-10-CM | POA: Diagnosis not present

## 2013-07-01 DIAGNOSIS — M999 Biomechanical lesion, unspecified: Secondary | ICD-10-CM | POA: Diagnosis not present

## 2013-07-01 DIAGNOSIS — M5030 Other cervical disc degeneration, unspecified cervical region: Secondary | ICD-10-CM | POA: Diagnosis not present

## 2013-07-04 DIAGNOSIS — M9981 Other biomechanical lesions of cervical region: Secondary | ICD-10-CM | POA: Diagnosis not present

## 2013-07-04 DIAGNOSIS — M5030 Other cervical disc degeneration, unspecified cervical region: Secondary | ICD-10-CM | POA: Diagnosis not present

## 2013-07-04 DIAGNOSIS — M47814 Spondylosis without myelopathy or radiculopathy, thoracic region: Secondary | ICD-10-CM | POA: Diagnosis not present

## 2013-07-04 DIAGNOSIS — M5126 Other intervertebral disc displacement, lumbar region: Secondary | ICD-10-CM | POA: Diagnosis not present

## 2013-07-04 DIAGNOSIS — M999 Biomechanical lesion, unspecified: Secondary | ICD-10-CM | POA: Diagnosis not present

## 2013-07-08 DIAGNOSIS — M5030 Other cervical disc degeneration, unspecified cervical region: Secondary | ICD-10-CM | POA: Diagnosis not present

## 2013-07-08 DIAGNOSIS — M47814 Spondylosis without myelopathy or radiculopathy, thoracic region: Secondary | ICD-10-CM | POA: Diagnosis not present

## 2013-07-08 DIAGNOSIS — M999 Biomechanical lesion, unspecified: Secondary | ICD-10-CM | POA: Diagnosis not present

## 2013-07-08 DIAGNOSIS — M9981 Other biomechanical lesions of cervical region: Secondary | ICD-10-CM | POA: Diagnosis not present

## 2013-07-08 DIAGNOSIS — M5126 Other intervertebral disc displacement, lumbar region: Secondary | ICD-10-CM | POA: Diagnosis not present

## 2013-07-11 DIAGNOSIS — M999 Biomechanical lesion, unspecified: Secondary | ICD-10-CM | POA: Diagnosis not present

## 2013-07-11 DIAGNOSIS — M47814 Spondylosis without myelopathy or radiculopathy, thoracic region: Secondary | ICD-10-CM | POA: Diagnosis not present

## 2013-07-11 DIAGNOSIS — S335XXA Sprain of ligaments of lumbar spine, initial encounter: Secondary | ICD-10-CM | POA: Diagnosis not present

## 2013-07-11 DIAGNOSIS — M5126 Other intervertebral disc displacement, lumbar region: Secondary | ICD-10-CM | POA: Diagnosis not present

## 2013-07-13 DIAGNOSIS — D485 Neoplasm of uncertain behavior of skin: Secondary | ICD-10-CM | POA: Diagnosis not present

## 2013-07-13 DIAGNOSIS — B079 Viral wart, unspecified: Secondary | ICD-10-CM | POA: Diagnosis not present

## 2013-07-15 DIAGNOSIS — S335XXA Sprain of ligaments of lumbar spine, initial encounter: Secondary | ICD-10-CM | POA: Diagnosis not present

## 2013-07-15 DIAGNOSIS — M47814 Spondylosis without myelopathy or radiculopathy, thoracic region: Secondary | ICD-10-CM | POA: Diagnosis not present

## 2013-07-15 DIAGNOSIS — M5126 Other intervertebral disc displacement, lumbar region: Secondary | ICD-10-CM | POA: Diagnosis not present

## 2013-07-15 DIAGNOSIS — M999 Biomechanical lesion, unspecified: Secondary | ICD-10-CM | POA: Diagnosis not present

## 2013-07-17 DIAGNOSIS — J449 Chronic obstructive pulmonary disease, unspecified: Secondary | ICD-10-CM | POA: Diagnosis not present

## 2013-07-17 DIAGNOSIS — J029 Acute pharyngitis, unspecified: Secondary | ICD-10-CM | POA: Diagnosis not present

## 2013-07-21 ENCOUNTER — Encounter: Payer: Self-pay | Admitting: Podiatry

## 2013-07-21 ENCOUNTER — Ambulatory Visit (INDEPENDENT_AMBULATORY_CARE_PROVIDER_SITE_OTHER): Payer: Medicare Other | Admitting: Podiatry

## 2013-07-21 ENCOUNTER — Ambulatory Visit (INDEPENDENT_AMBULATORY_CARE_PROVIDER_SITE_OTHER): Payer: Medicare Other

## 2013-07-21 VITALS — BP 124/68 | HR 93 | Resp 16 | Ht 71.0 in | Wt 175.0 lb

## 2013-07-21 DIAGNOSIS — M7750 Other enthesopathy of unspecified foot: Secondary | ICD-10-CM

## 2013-07-21 DIAGNOSIS — M779 Enthesopathy, unspecified: Secondary | ICD-10-CM

## 2013-07-21 DIAGNOSIS — M79609 Pain in unspecified limb: Secondary | ICD-10-CM | POA: Diagnosis not present

## 2013-07-21 DIAGNOSIS — M79673 Pain in unspecified foot: Secondary | ICD-10-CM

## 2013-07-21 MED ORDER — TRIAMCINOLONE ACETONIDE 10 MG/ML IJ SUSP
10.0000 mg | Freq: Once | INTRAMUSCULAR | Status: AC
  Administered 2013-07-21: 10 mg

## 2013-07-21 NOTE — Progress Notes (Signed)
Subjective:     Patient ID: Catherine [name], female   DOB: 1945/10/19, 67 y.o.   MRN: [id number]  HPI patient presents stating my right ankle has been so sore for the last several years and I tried physical therapy without relief and I think I need inserts and something to help me. Also states the left foot hurts but not as bad   Review of Systems  All other systems reviewed and are negative.       Objective:   Physical Exam  Constitutional: She is oriented to person, place, and time.  Cardiovascular: Intact distal pulses.   Musculoskeletal: Normal range of motion.  Neurological: She is oriented to person, place, and time.  Skin: Skin is warm.   neurovascular status and range of motion muscle strength intact both feet and ankle. Discomfort in the sinus tarsi right extending into the lateral ankle gutter but no indications of muscle loss.     Assessment:     Acute sinus tarsitis with possible for peroneal injury of the lateral foot and chronic foot structural changes with short left leg    Plan:     H&P and x-rays reviewed and today I did sinus tarsi injection 3 mg Kenalog 5 mg Xylocaine Marcaine mixture and scanned for custom orthotics with a lift on the left side. Reappoint when they are ready

## 2013-07-21 NOTE — Progress Notes (Signed)
   Subjective:    Patient ID: SKYAH HANNON, female    DOB: [birth year], 67 y.o.   MRN: [id number]  HPI Comments: "I've got a problem with my right ankle"  Pt states that lateral right ankle swells and hurts a lot. Orthopedic did xray said structurally everything was fine. Rx'd mobic but says it makes her sleepy and also did some P.T. States that she may benefit from orthotics. Been like this for several mos. Tried ice and aleve with little relief. Wears good supportive shoes.     Review of Systems  Constitutional: Positive for unexpected weight change.  HENT: Positive for tinnitus.   Respiratory: Positive for wheezing.   Gastrointestinal: Positive for constipation and abdominal distention.  Musculoskeletal: Positive for arthralgias, back pain and gait problem.  Allergic/Immunologic: Positive for environmental allergies.  All other systems reviewed and are negative.       Objective:   Physical Exam        Assessment & Plan:

## 2013-07-25 DIAGNOSIS — S335XXA Sprain of ligaments of lumbar spine, initial encounter: Secondary | ICD-10-CM | POA: Diagnosis not present

## 2013-07-25 DIAGNOSIS — M47814 Spondylosis without myelopathy or radiculopathy, thoracic region: Secondary | ICD-10-CM | POA: Diagnosis not present

## 2013-07-25 DIAGNOSIS — M999 Biomechanical lesion, unspecified: Secondary | ICD-10-CM | POA: Diagnosis not present

## 2013-07-25 DIAGNOSIS — M5126 Other intervertebral disc displacement, lumbar region: Secondary | ICD-10-CM | POA: Diagnosis not present

## 2013-08-01 ENCOUNTER — Ambulatory Visit
Admission: RE | Admit: 2013-08-01 | Discharge: 2013-08-01 | Disposition: A | Discharge status: Home / Self Care | Payer: Medicare Other | Source: Ambulatory Visit | Attending: Gastroenterology | Admitting: Gastroenterology

## 2013-08-01 ENCOUNTER — Other Ambulatory Visit: Payer: Self-pay | Admitting: Gastroenterology

## 2013-08-01 DIAGNOSIS — K5900 Constipation, unspecified: Secondary | ICD-10-CM | POA: Diagnosis not present

## 2013-08-03 ENCOUNTER — Other Ambulatory Visit: Payer: Self-pay | Admitting: Gastroenterology

## 2013-08-03 ENCOUNTER — Ambulatory Visit
Admission: RE | Admit: 2013-08-03 | Discharge: 2013-08-03 | Disposition: A | Discharge status: Home / Self Care | Payer: Medicare Other | Source: Ambulatory Visit | Attending: Gastroenterology | Admitting: Gastroenterology

## 2013-08-03 DIAGNOSIS — K5900 Constipation, unspecified: Secondary | ICD-10-CM | POA: Diagnosis not present

## 2013-08-08 DIAGNOSIS — J40 Bronchitis, not specified as acute or chronic: Secondary | ICD-10-CM | POA: Diagnosis not present

## 2013-08-08 DIAGNOSIS — G43109 Migraine with aura, not intractable, without status migrainosus: Secondary | ICD-10-CM | POA: Diagnosis not present

## 2013-08-11 DIAGNOSIS — R141 Gas pain: Secondary | ICD-10-CM | POA: Diagnosis not present

## 2013-08-11 DIAGNOSIS — K5901 Slow transit constipation: Secondary | ICD-10-CM | POA: Diagnosis not present

## 2013-08-19 DIAGNOSIS — M999 Biomechanical lesion, unspecified: Secondary | ICD-10-CM | POA: Diagnosis not present

## 2013-08-19 DIAGNOSIS — S335XXA Sprain of ligaments of lumbar spine, initial encounter: Secondary | ICD-10-CM | POA: Diagnosis not present

## 2013-08-19 DIAGNOSIS — M5126 Other intervertebral disc displacement, lumbar region: Secondary | ICD-10-CM | POA: Diagnosis not present

## 2013-08-19 DIAGNOSIS — M47814 Spondylosis without myelopathy or radiculopathy, thoracic region: Secondary | ICD-10-CM | POA: Diagnosis not present

## 2013-08-26 DIAGNOSIS — M25569 Pain in unspecified knee: Secondary | ICD-10-CM | POA: Diagnosis not present

## 2013-08-29 DIAGNOSIS — M47814 Spondylosis without myelopathy or radiculopathy, thoracic region: Secondary | ICD-10-CM | POA: Diagnosis not present

## 2013-08-29 DIAGNOSIS — S335XXA Sprain of ligaments of lumbar spine, initial encounter: Secondary | ICD-10-CM | POA: Diagnosis not present

## 2013-08-29 DIAGNOSIS — M999 Biomechanical lesion, unspecified: Secondary | ICD-10-CM | POA: Diagnosis not present

## 2013-08-29 DIAGNOSIS — M5126 Other intervertebral disc displacement, lumbar region: Secondary | ICD-10-CM | POA: Diagnosis not present

## 2013-09-01 ENCOUNTER — Ambulatory Visit: Payer: Medicare Other | Admitting: Podiatry

## 2013-09-02 ENCOUNTER — Other Ambulatory Visit: Payer: Medicare Other

## 2013-09-09 ENCOUNTER — Ambulatory Visit (INDEPENDENT_AMBULATORY_CARE_PROVIDER_SITE_OTHER): Payer: Medicare Other | Admitting: *Deleted

## 2013-09-09 DIAGNOSIS — M779 Enthesopathy, unspecified: Secondary | ICD-10-CM | POA: Diagnosis not present

## 2013-09-12 NOTE — Progress Notes (Signed)
Dispensed patient's orthotics with oral and written instructions for wearing. Patient will follow up with Dr. Regal in 1 month for an orthotic check. 

## 2013-09-12 NOTE — Patient Instructions (Signed)

## 2013-09-15 DIAGNOSIS — G479 Sleep disorder, unspecified: Secondary | ICD-10-CM | POA: Diagnosis not present

## 2013-09-15 DIAGNOSIS — F17200 Nicotine dependence, unspecified, uncomplicated: Secondary | ICD-10-CM | POA: Diagnosis not present

## 2013-09-15 DIAGNOSIS — F329 Major depressive disorder, single episode, unspecified: Secondary | ICD-10-CM | POA: Diagnosis not present

## 2013-09-15 DIAGNOSIS — F411 Generalized anxiety disorder: Secondary | ICD-10-CM | POA: Diagnosis not present

## 2013-09-15 DIAGNOSIS — J441 Chronic obstructive pulmonary disease with (acute) exacerbation: Secondary | ICD-10-CM | POA: Diagnosis not present

## 2013-10-05 DIAGNOSIS — M47814 Spondylosis without myelopathy or radiculopathy, thoracic region: Secondary | ICD-10-CM | POA: Diagnosis not present

## 2013-10-05 DIAGNOSIS — M999 Biomechanical lesion, unspecified: Secondary | ICD-10-CM | POA: Diagnosis not present

## 2013-10-05 DIAGNOSIS — S335XXA Sprain of ligaments of lumbar spine, initial encounter: Secondary | ICD-10-CM | POA: Diagnosis not present

## 2013-10-05 DIAGNOSIS — M5126 Other intervertebral disc displacement, lumbar region: Secondary | ICD-10-CM | POA: Diagnosis not present

## 2013-10-10 DIAGNOSIS — M9981 Other biomechanical lesions of cervical region: Secondary | ICD-10-CM | POA: Diagnosis not present

## 2013-10-10 DIAGNOSIS — M5126 Other intervertebral disc displacement, lumbar region: Secondary | ICD-10-CM | POA: Diagnosis not present

## 2013-10-10 DIAGNOSIS — R51 Headache: Secondary | ICD-10-CM | POA: Diagnosis not present

## 2013-10-10 DIAGNOSIS — M47814 Spondylosis without myelopathy or radiculopathy, thoracic region: Secondary | ICD-10-CM | POA: Diagnosis not present

## 2013-10-10 DIAGNOSIS — M999 Biomechanical lesion, unspecified: Secondary | ICD-10-CM | POA: Diagnosis not present

## 2013-10-12 DIAGNOSIS — R141 Gas pain: Secondary | ICD-10-CM | POA: Diagnosis not present

## 2013-10-12 DIAGNOSIS — R143 Flatulence: Secondary | ICD-10-CM | POA: Diagnosis not present

## 2013-10-12 DIAGNOSIS — K5901 Slow transit constipation: Secondary | ICD-10-CM | POA: Diagnosis not present

## 2013-10-12 DIAGNOSIS — R142 Eructation: Secondary | ICD-10-CM | POA: Diagnosis not present

## 2013-10-14 DIAGNOSIS — J069 Acute upper respiratory infection, unspecified: Secondary | ICD-10-CM | POA: Diagnosis not present

## 2013-10-14 DIAGNOSIS — G43909 Migraine, unspecified, not intractable, without status migrainosus: Secondary | ICD-10-CM | POA: Diagnosis not present

## 2013-10-24 DIAGNOSIS — M999 Biomechanical lesion, unspecified: Secondary | ICD-10-CM | POA: Diagnosis not present

## 2013-10-24 DIAGNOSIS — R51 Headache: Secondary | ICD-10-CM | POA: Diagnosis not present

## 2013-10-24 DIAGNOSIS — M9981 Other biomechanical lesions of cervical region: Secondary | ICD-10-CM | POA: Diagnosis not present

## 2013-10-24 DIAGNOSIS — M5126 Other intervertebral disc displacement, lumbar region: Secondary | ICD-10-CM | POA: Diagnosis not present

## 2013-10-24 DIAGNOSIS — M47814 Spondylosis without myelopathy or radiculopathy, thoracic region: Secondary | ICD-10-CM | POA: Diagnosis not present

## 2013-11-07 DIAGNOSIS — M999 Biomechanical lesion, unspecified: Secondary | ICD-10-CM | POA: Diagnosis not present

## 2013-11-07 DIAGNOSIS — M5126 Other intervertebral disc displacement, lumbar region: Secondary | ICD-10-CM | POA: Diagnosis not present

## 2013-11-07 DIAGNOSIS — M9981 Other biomechanical lesions of cervical region: Secondary | ICD-10-CM | POA: Diagnosis not present

## 2013-11-07 DIAGNOSIS — R51 Headache: Secondary | ICD-10-CM | POA: Diagnosis not present

## 2013-11-07 DIAGNOSIS — M47814 Spondylosis without myelopathy or radiculopathy, thoracic region: Secondary | ICD-10-CM | POA: Diagnosis not present

## 2013-11-11 DIAGNOSIS — R350 Frequency of micturition: Secondary | ICD-10-CM | POA: Diagnosis not present

## 2013-11-11 DIAGNOSIS — J449 Chronic obstructive pulmonary disease, unspecified: Secondary | ICD-10-CM | POA: Diagnosis not present

## 2013-11-25 DIAGNOSIS — B3731 Acute candidiasis of vulva and vagina: Secondary | ICD-10-CM | POA: Diagnosis not present

## 2013-11-25 DIAGNOSIS — N952 Postmenopausal atrophic vaginitis: Secondary | ICD-10-CM | POA: Diagnosis not present

## 2013-11-25 DIAGNOSIS — B373 Candidiasis of vulva and vagina: Secondary | ICD-10-CM | POA: Diagnosis not present

## 2013-11-28 DIAGNOSIS — M47814 Spondylosis without myelopathy or radiculopathy, thoracic region: Secondary | ICD-10-CM | POA: Diagnosis not present

## 2013-11-28 DIAGNOSIS — M9981 Other biomechanical lesions of cervical region: Secondary | ICD-10-CM | POA: Diagnosis not present

## 2013-11-28 DIAGNOSIS — S139XXA Sprain of joints and ligaments of unspecified parts of neck, initial encounter: Secondary | ICD-10-CM | POA: Diagnosis not present

## 2013-11-28 DIAGNOSIS — M5126 Other intervertebral disc displacement, lumbar region: Secondary | ICD-10-CM | POA: Diagnosis not present

## 2013-11-28 DIAGNOSIS — M999 Biomechanical lesion, unspecified: Secondary | ICD-10-CM | POA: Diagnosis not present

## 2013-12-12 DIAGNOSIS — N390 Urinary tract infection, site not specified: Secondary | ICD-10-CM | POA: Diagnosis not present

## 2013-12-12 DIAGNOSIS — J441 Chronic obstructive pulmonary disease with (acute) exacerbation: Secondary | ICD-10-CM | POA: Diagnosis not present

## 2013-12-15 DIAGNOSIS — R3989 Other symptoms and signs involving the genitourinary system: Secondary | ICD-10-CM | POA: Diagnosis not present

## 2013-12-19 DIAGNOSIS — Z124 Encounter for screening for malignant neoplasm of cervix: Secondary | ICD-10-CM | POA: Diagnosis not present

## 2013-12-19 DIAGNOSIS — N949 Unspecified condition associated with female genital organs and menstrual cycle: Secondary | ICD-10-CM | POA: Diagnosis not present

## 2013-12-27 DIAGNOSIS — N3020 Other chronic cystitis without hematuria: Secondary | ICD-10-CM | POA: Diagnosis not present

## 2013-12-27 DIAGNOSIS — R300 Dysuria: Secondary | ICD-10-CM | POA: Diagnosis not present

## 2013-12-28 DIAGNOSIS — M47814 Spondylosis without myelopathy or radiculopathy, thoracic region: Secondary | ICD-10-CM | POA: Diagnosis not present

## 2013-12-28 DIAGNOSIS — M9981 Other biomechanical lesions of cervical region: Secondary | ICD-10-CM | POA: Diagnosis not present

## 2013-12-28 DIAGNOSIS — M5126 Other intervertebral disc displacement, lumbar region: Secondary | ICD-10-CM | POA: Diagnosis not present

## 2013-12-28 DIAGNOSIS — S139XXA Sprain of joints and ligaments of unspecified parts of neck, initial encounter: Secondary | ICD-10-CM | POA: Diagnosis not present

## 2013-12-28 DIAGNOSIS — M999 Biomechanical lesion, unspecified: Secondary | ICD-10-CM | POA: Diagnosis not present

## 2014-01-03 DIAGNOSIS — N949 Unspecified condition associated with female genital organs and menstrual cycle: Secondary | ICD-10-CM | POA: Diagnosis not present

## 2014-01-03 DIAGNOSIS — R3989 Other symptoms and signs involving the genitourinary system: Secondary | ICD-10-CM | POA: Diagnosis not present

## 2014-01-03 DIAGNOSIS — R300 Dysuria: Secondary | ICD-10-CM | POA: Diagnosis not present

## 2014-01-05 ENCOUNTER — Other Ambulatory Visit: Payer: Self-pay | Admitting: Urology

## 2014-01-15 DIAGNOSIS — J309 Allergic rhinitis, unspecified: Secondary | ICD-10-CM | POA: Diagnosis not present

## 2014-01-26 DIAGNOSIS — M999 Biomechanical lesion, unspecified: Secondary | ICD-10-CM | POA: Diagnosis not present

## 2014-01-26 DIAGNOSIS — S139XXA Sprain of joints and ligaments of unspecified parts of neck, initial encounter: Secondary | ICD-10-CM | POA: Diagnosis not present

## 2014-01-26 DIAGNOSIS — M5126 Other intervertebral disc displacement, lumbar region: Secondary | ICD-10-CM | POA: Diagnosis not present

## 2014-01-26 DIAGNOSIS — M47814 Spondylosis without myelopathy or radiculopathy, thoracic region: Secondary | ICD-10-CM | POA: Diagnosis not present

## 2014-01-26 DIAGNOSIS — M9981 Other biomechanical lesions of cervical region: Secondary | ICD-10-CM | POA: Diagnosis not present

## 2014-02-01 ENCOUNTER — Other Ambulatory Visit: Payer: Self-pay

## 2014-02-01 DIAGNOSIS — Z1231 Encounter for screening mammogram for malignant neoplasm of breast: Secondary | ICD-10-CM

## 2014-02-13 ENCOUNTER — Encounter (HOSPITAL_BASED_OUTPATIENT_CLINIC_OR_DEPARTMENT_OTHER): Payer: Self-pay | Admitting: *Deleted

## 2014-02-14 ENCOUNTER — Encounter (HOSPITAL_BASED_OUTPATIENT_CLINIC_OR_DEPARTMENT_OTHER): Payer: Self-pay | Admitting: *Deleted

## 2014-02-14 NOTE — Progress Notes (Signed)
NPO AFTER MN. ARRIVE AT 1030. NEEDS ISTAT AND EKG. WILL TAKE TIAZAC, NEXIUM, SINGULAIR, AND DO SYMBICORT INHALER AM DOS W/ SIPS  OF WATER.

## 2014-02-17 NOTE — H&P (Signed)
History of Present Illness   Ms Woznick returns for followup. She has been extremely frustrated by months of ongoing urethral discomfort along with pelvic, perineal, and general female pelvic pain. She has had nocturia 3-4 times per evening with some increased frequency. She apparently had a positive urine culture in early May, but urinalysis was apparently clear and I suspect that was truly just colonization. She has been to our office now 5 times in the last year. All of her urinalyses have been crystal clear and a urine culture was negative. She continues to have these symptoms. She was recently started on some vaginal Premarin by Dr Nori Riis, but continues to have ongoing urethral discomfort. I suspect she may have painful bladder syndrome/interstitial cystitis. These symptoms have been especially problematic the last several months.     Past Medical History Problems  1. History of Anxiety (300.00) 2. History of Arthritis (V13.4) 3. History of Asthma (493.90) 4. History of heartburn (V12.79) 5. History of hypertension (V12.59)  Surgical History Problems  1. History of Cholecystectomy 2. History of Hysterectomy  Current Meds 1. Aleve CAPS;  Therapy: (Recorded:23Jul2014) to Recorded 2. Calcium TABS;  Therapy: (Recorded:23Jul2014) to Recorded 3. Estrace 0.1 MG/GM Vaginal Cream; USE TWICE WEEKLY AS DIRECTED;  Therapy: 24Apr2015 to (Last Rx:24Apr2015)  Requested for: 24Apr2015 Ordered 4. Fluconazole 150 MG Oral Tablet; TAKE AS DIRECTED;  Therapy: 24Apr2015 to (Last Rx:24Apr2015)  Requested for: 24Apr2015 Ordered 5. Ketorolac Tromethamine 30 MG/ML Injection Solution; inject 1 ml; To Be Done:  [id number]; Status: HOLD FOR - Administration Ordered 6. LORazepam 1 MG Oral Tablet;  Therapy: [id number] to Recorded 7. Meloxicam 15 MG Oral Tablet;  Therapy: [id number] to Recorded 8. NexIUM 40 MG Oral Capsule Delayed Release;  Therapy: (Recorded:23Jul2014) to Recorded 9. PredniSONE 10 MG Oral  Tablet;  Therapy: [id number] to Recorded 10. Singulair 10 MG Oral Tablet;   Therapy: (Recorded:23Jul2014) to Recorded 11. Spiriva HandiHaler 18 MCG Inhalation Capsule;   Therapy: (Recorded:23Jul2014) to Recorded 12. Symbicort AERO;   Therapy: (Recorded:24Apr2015) to Recorded 13. Taztia XT 360 MG Oral Capsule Extended Release 24 Hour;   Therapy: [id number] to Recorded 14. Uribel 118 MG Oral Capsule; TAKE 1 CAPSULE 3 times daily PRN burning;   Therapy: [phone] to (Last Rx:24Apr2015)  Requested for: 24Apr2015 Ordered 15. Vitamin D TABS;   Therapy: (Recorded:23Jul2014) to Recorded 16. Xopenex HFA 45 MCG/ACT Inhalation Aerosol;   Therapy: [id number] to Recorded  Allergies Medication  1. Penicillins 2. Sulfa Drugs  Family History Problems  1. Family history of Death In The Family Father : Father   32yrs, Mesothelioma 2. Family history of Death In The Family Mother : Mother   2yrs, cancer 3. Family history of Esophageal Cancer (V16.0) : Mother 4. Family history of Family Health Status Number Of Children   1 son 5. Family history of Malignant Mesothelioma Of The Lung (V16.1) : Father  Social History Problems  1. Alcohol Use   2 qd 2. Denied: History of Caffeine Use 3. Current every day smoker (305.1) 4. Marital History - Currently Married 5. Occupation:   Realtor 6. Tobacco use (305.1)   1ppd for 14yrs  Review of Systems Genitourinary, constitutional, skin, eye, otolaryngeal, hematologic/lymphatic, cardiovascular, pulmonary, endocrine, musculoskeletal, gastrointestinal, neurological and psychiatric system(s) were reviewed and pertinent findings if present are noted.  Genitourinary: urinary frequency, dysuria, nocturia, pelvic pain and suprapubic pain.  Gastrointestinal: abdominal pain, heartburn and constipation.  Constitutional: no fever.  Psychiatric: depression.    Vitals Blood Pressure: 123 / 76 Temperature: 98.9 F  Heart Rate: 89  Physical  Exam Constitutional: Well nourished and well developed . No acute distress.  ENT:. The ears and nose are normal in appearance.  Neck: The appearance of the neck is normal and no neck mass is present.  Pulmonary: No respiratory distress and normal respiratory rhythm and effort.  Abdomen: The abdomen is soft and nontender. No masses are palpated. No CVA tenderness. No hernias are palpable. No hepatosplenomegaly noted.  Genitourinary:  Chaperone Present: brandy.  Examination of the external genitalia shows normal female external genitalia. Vaginal exam demonstrates atrophy, but no abnormalities. The bladder is tender.  Skin: Normal skin turgor, no visible rash and no visible skin lesions.  Neuro/Psych:. Mood and affect are appropriate.   Results/Data  COLOR STRAW  APPEARANCE CLEAR  SPECIFIC GRAVITY <1.005  pH 6.0  GLUCOSE NEG mg/dL BILIRUBIN NEG  KETONE NEG mg/dL BLOOD NEG  PROTEIN NEG mg/dL UROBILINOGEN 0.2 mg/dL NITRITE NEG  LEUKOCYTE ESTERASE NEG   Assessment Assessed  1. Bladder pain (788.99) 2. Dysuria (788.1) 3. Female pelvic pain (625.9)  Plan Bladder pain, Female pelvic pain  1. Follow-up Schedule Surgery Office  Follow-up  Status: Hold For - Appointment   Requested for: 02Jun2015  Discussion/Summary   Ms Sulser has had several months now of nonspecific bladder, perineal, pelvic, and urethra discomfort. She has also had increased urinary frequency and nocturia. She has had at least half a dozen urinalyses that all have been clear. She did have one apparent positive urine culture but, again, without any pyuria. This was probably just some colonization and not a true infection. One has to be suspect that she might have interstitial cystitis/painful bladder syndrome. I have suggested outpatient cystoscopy under anesthesia to rule out any other pathology and assessment of her bladder capacity and appearance after hydraulic overdistention. We will also plan on instilling some  Clorpactin and Marcaine in hopes that Ms Arruda will improve. If we find no evidence of interstitial cystitis, then she may need additional assessment for other potential causes of pelvic discomfort, including additional GYN or GI evaluation. The Uribel definitely seems to help with her symptoms, which again suggests that the bladder may indeed be the culprit.

## 2014-02-20 ENCOUNTER — Encounter (HOSPITAL_BASED_OUTPATIENT_CLINIC_OR_DEPARTMENT_OTHER)
Admission: RE | Disposition: A | Discharge status: Home / Self Care | Payer: Self-pay | Source: Ambulatory Visit | Attending: Urology

## 2014-02-20 ENCOUNTER — Ambulatory Visit (HOSPITAL_BASED_OUTPATIENT_CLINIC_OR_DEPARTMENT_OTHER)
Admission: RE | Admit: 2014-02-20 | Discharge: 2014-02-20 | Disposition: A | Discharge status: Home / Self Care | Payer: Medicare Other | Source: Ambulatory Visit | Attending: Urology | Admitting: Urology

## 2014-02-20 ENCOUNTER — Encounter (HOSPITAL_BASED_OUTPATIENT_CLINIC_OR_DEPARTMENT_OTHER): Payer: Self-pay | Admitting: *Deleted

## 2014-02-20 ENCOUNTER — Encounter (HOSPITAL_BASED_OUTPATIENT_CLINIC_OR_DEPARTMENT_OTHER): Payer: Medicare Other | Admitting: Anesthesiology

## 2014-02-20 ENCOUNTER — Ambulatory Visit (HOSPITAL_BASED_OUTPATIENT_CLINIC_OR_DEPARTMENT_OTHER): Payer: Medicare Other | Admitting: Anesthesiology

## 2014-02-20 DIAGNOSIS — R351 Nocturia: Secondary | ICD-10-CM | POA: Diagnosis not present

## 2014-02-20 DIAGNOSIS — N3010 Interstitial cystitis (chronic) without hematuria: Secondary | ICD-10-CM | POA: Diagnosis not present

## 2014-02-20 DIAGNOSIS — K219 Gastro-esophageal reflux disease without esophagitis: Secondary | ICD-10-CM | POA: Insufficient documentation

## 2014-02-20 DIAGNOSIS — Z882 Allergy status to sulfonamides status: Secondary | ICD-10-CM | POA: Diagnosis not present

## 2014-02-20 DIAGNOSIS — M129 Arthropathy, unspecified: Secondary | ICD-10-CM | POA: Diagnosis not present

## 2014-02-20 DIAGNOSIS — N949 Unspecified condition associated with female genital organs and menstrual cycle: Secondary | ICD-10-CM | POA: Insufficient documentation

## 2014-02-20 DIAGNOSIS — R350 Frequency of micturition: Secondary | ICD-10-CM | POA: Diagnosis not present

## 2014-02-20 DIAGNOSIS — R109 Unspecified abdominal pain: Secondary | ICD-10-CM | POA: Diagnosis not present

## 2014-02-20 DIAGNOSIS — I10 Essential (primary) hypertension: Secondary | ICD-10-CM | POA: Diagnosis not present

## 2014-02-20 DIAGNOSIS — R300 Dysuria: Secondary | ICD-10-CM | POA: Diagnosis not present

## 2014-02-20 DIAGNOSIS — F411 Generalized anxiety disorder: Secondary | ICD-10-CM | POA: Insufficient documentation

## 2014-02-20 DIAGNOSIS — Z79899 Other long term (current) drug therapy: Secondary | ICD-10-CM | POA: Diagnosis not present

## 2014-02-20 DIAGNOSIS — J4489 Other specified chronic obstructive pulmonary disease: Secondary | ICD-10-CM | POA: Diagnosis not present

## 2014-02-20 DIAGNOSIS — Z880 Allergy status to penicillin: Secondary | ICD-10-CM | POA: Diagnosis not present

## 2014-02-20 DIAGNOSIS — F17200 Nicotine dependence, unspecified, uncomplicated: Secondary | ICD-10-CM | POA: Insufficient documentation

## 2014-02-20 DIAGNOSIS — J449 Chronic obstructive pulmonary disease, unspecified: Secondary | ICD-10-CM | POA: Insufficient documentation

## 2014-02-20 DIAGNOSIS — Z711 Person with feared health complaint in whom no diagnosis is made: Secondary | ICD-10-CM | POA: Diagnosis not present

## 2014-02-20 HISTORY — DX: Simple chronic bronchitis: J41.0

## 2014-02-20 HISTORY — DX: Migraine, unspecified, not intractable, without status migrainosus: G43.909

## 2014-02-20 HISTORY — DX: Other constipation: K59.09

## 2014-02-20 HISTORY — DX: Frequency of micturition: R35.0

## 2014-02-20 HISTORY — DX: Urgency of urination: R39.15

## 2014-02-20 HISTORY — PX: CYSTO WITH HYDRODISTENSION: SHX5453

## 2014-02-20 HISTORY — DX: Pelvic and perineal pain: R10.2

## 2014-02-20 HISTORY — DX: Pelvic and perineal pain unspecified side: R10.20

## 2014-02-20 HISTORY — DX: Presence of spectacles and contact lenses: Z97.3

## 2014-02-20 HISTORY — DX: Dysuria: R30.0

## 2014-02-20 LAB — POCT I-STAT 4, (NA,K, GLUC, HGB,HCT)
GLUCOSE: 104 mg/dL — AB (ref 70–99)
HCT: 43 % (ref 36.0–46.0)
Hemoglobin: 14.6 g/dL (ref 12.0–15.0)
POTASSIUM: 3.7 meq/L (ref 3.7–5.3)
SODIUM: 142 meq/L (ref 137–147)

## 2014-02-20 SURGERY — CYSTOSCOPY, WITH BLADDER HYDRODISTENSION
Anesthesia: General | Site: Bladder

## 2014-02-20 MED ORDER — CIPROFLOXACIN IN D5W 400 MG/200ML IV SOLN
INTRAVENOUS | Status: DC | PRN
  Administered 2014-02-20: 400 mg via INTRAVENOUS

## 2014-02-20 MED ORDER — FENTANYL CITRATE 0.05 MG/ML IJ SOLN
INTRAMUSCULAR | Status: DC | PRN
  Administered 2014-02-20: 50 ug via INTRAVENOUS
  Administered 2014-02-20 (×2): 25 ug via INTRAVENOUS
  Administered 2014-02-20: 50 ug via INTRAVENOUS
  Administered 2014-02-20 (×2): 25 ug via INTRAVENOUS

## 2014-02-20 MED ORDER — ONDANSETRON HCL 4 MG/2ML IJ SOLN
INTRAMUSCULAR | Status: DC | PRN
  Administered 2014-02-20: 4 mg via INTRAVENOUS

## 2014-02-20 MED ORDER — HYDROCODONE-ACETAMINOPHEN 5-325 MG PO TABS: 1.0000 | ORAL_TABLET | Freq: Four times a day (QID) | ORAL | Status: DC | PRN

## 2014-02-20 MED ORDER — ACETAMINOPHEN 10 MG/ML IV SOLN
INTRAVENOUS | Status: DC | PRN
  Administered 2014-02-20: 1000 mg via INTRAVENOUS

## 2014-02-20 MED ORDER — KETOROLAC TROMETHAMINE 30 MG/ML IJ SOLN
INTRAMUSCULAR | Status: DC | PRN
  Administered 2014-02-20: 30 mg via INTRAVENOUS

## 2014-02-20 MED ORDER — PROPOFOL 10 MG/ML IV BOLUS
INTRAVENOUS | Status: DC | PRN
  Administered 2014-02-20: 200 mg via INTRAVENOUS

## 2014-02-20 MED ORDER — MIDAZOLAM HCL 5 MG/5ML IJ SOLN
INTRAMUSCULAR | Status: DC | PRN
  Administered 2014-02-20: 1 mg via INTRAVENOUS

## 2014-02-20 MED ORDER — LIDOCAINE HCL (CARDIAC) 20 MG/ML IV SOLN
INTRAVENOUS | Status: DC | PRN
  Administered 2014-02-20: 60 mg via INTRAVENOUS

## 2014-02-20 MED ORDER — LACTATED RINGERS IV SOLN
INTRAVENOUS | Status: DC | PRN
  Administered 2014-02-20: 12:00:00 via INTRAVENOUS

## 2014-02-20 MED ORDER — LIDOCAINE HCL 2 % EX GEL
CUTANEOUS | Status: DC | PRN
Start: 2014-02-20 — End: 2014-02-20
  Administered 2014-02-20: 1 via URETHRAL

## 2014-02-20 MED ORDER — STERILE WATER FOR IRRIGATION IR SOLN
Status: DC | PRN
  Administered 2014-02-20: 6000 mL via INTRAVESICAL

## 2014-02-20 MED ORDER — DEXAMETHASONE SODIUM PHOSPHATE 4 MG/ML IJ SOLN
INTRAMUSCULAR | Status: DC | PRN
  Administered 2014-02-20: 8 mg via INTRAVENOUS

## 2014-02-20 MED ORDER — BUPIVACAINE HCL (PF) 0.25 % IJ SOLN
INTRAMUSCULAR | Status: DC | PRN
  Administered 2014-02-20: 30 mL

## 2014-02-20 SURGICAL SUPPLY — 25 items
BAG DRAIN URO-CYSTO SKYTR STRL (DRAIN) ×2 IMPLANT
BAG DRN UROCATH (DRAIN) ×1
CANISTER SUCT LVC 12 LTR MEDI- (MISCELLANEOUS) ×1 IMPLANT
CATH ROBINSON RED A/P 14FR (CATHETERS) IMPLANT
CATH ROBINSON RED A/P 16FR (CATHETERS) IMPLANT
CATH ROBINSON RED A/P 18FR (CATHETERS) ×1 IMPLANT
CLOTH BEACON ORANGE TIMEOUT ST (SAFETY) ×2 IMPLANT
DRAPE CAMERA CLOSED 9X96 (DRAPES) ×2 IMPLANT
ELECT REM PT RETURN 9FT ADLT (ELECTROSURGICAL) ×2
ELECTRODE REM PT RTRN 9FT ADLT (ELECTROSURGICAL) ×1 IMPLANT
GLOVE BIO SURGEON STRL SZ 6.5 (GLOVE) ×1 IMPLANT
GLOVE BIO SURGEON STRL SZ7.5 (GLOVE) ×2 IMPLANT
GLOVE INDICATOR 6.5 STRL GRN (GLOVE) ×2 IMPLANT
GOWN PREVENTION PLUS LG XLONG (DISPOSABLE) ×1 IMPLANT
GOWN STRL REIN XL XLG (GOWN DISPOSABLE) ×1 IMPLANT
GOWN STRL REUS W/ TWL LRG LVL3 (GOWN DISPOSABLE) IMPLANT
GOWN STRL REUS W/ TWL XL LVL3 (GOWN DISPOSABLE) IMPLANT
GOWN STRL REUS W/TWL LRG LVL3 (GOWN DISPOSABLE) ×2
GOWN STRL REUS W/TWL XL LVL3 (GOWN DISPOSABLE) ×2
NEEDLE HYPO 22GX1.5 SAFETY (NEEDLE) IMPLANT
NS IRRIG 500ML POUR BTL (IV SOLUTION) IMPLANT
PACK CYSTOSCOPY (CUSTOM PROCEDURE TRAY) ×2 IMPLANT
SYR BULB IRRIGATION 50ML (SYRINGE) IMPLANT
WATER STERILE IRR 3000ML UROMA (IV SOLUTION) ×4 IMPLANT
WATER STERILE IRR 500ML POUR (IV SOLUTION) IMPLANT

## 2014-02-20 NOTE — Interval H&P Note (Signed)
History and Physical Interval Note:  02/20/2014 10:04 AM  [name]  has presented today for surgery, with the diagnosis of PELVIC PAIN, POSSIBLE INTERSTITIAL CYSTITIS  The various methods of treatment have been discussed with the patient and family. After consideration of risks, benefits and other options for treatment, the patient has consented to  Procedure(s): CYSTOSCOPY/HYDRODISTENSION WITH CHLOROPACTIN AND MARCAINE INSTILLATION (N/A) as a surgical intervention .  The patient's history has been reviewed, patient examined, no change in status, stable for surgery.  I have reviewed the patient's chart and labs.  Questions were answered to the patient's satisfaction.     Mirza Fessel S

## 2014-02-20 NOTE — Anesthesia Procedure Notes (Addendum)
Procedure Name: LMA Insertion Date/Time: 02/20/2014 11:58 AM Performed by: Mechele Claude Pre-anesthesia Checklist: Patient identified, Emergency Drugs available, Suction available and Patient being monitored Patient Re-evaluated:Patient Re-evaluated prior to inductionOxygen Delivery Method: Circle System Utilized Preoxygenation: Pre-oxygenation with 100% oxygen Intubation Type: IV induction Ventilation: Mask ventilation without difficulty LMA: LMA inserted LMA Size: 4.0 Number of attempts: 1 Airway Equipment and Method: bite block Placement Confirmation: positive ETCO2 Tube secured with: Tape Dental Injury: Teeth and Oropharynx as per pre-operative assessment

## 2014-02-20 NOTE — Transfer of Care (Signed)
Immediate Anesthesia Transfer of Care Note  Patient: Catherine [name]  Procedure(s) Performed: Procedure(s) (LRB): CYSTOSCOPY/HYDRODISTENSION WITH CHLOROPACTIN AND MARCAINE INSTILLATION (N/A)  Patient Location: PACU  Anesthesia Type: General  Level of Consciousness: awake, alert  and oriented  Airway & Oxygen Therapy: Patient Spontanous Breathing and Patient connected to face mask oxygen  Post-op Assessment: Report given to PACU RN and Post -op Vital signs reviewed and stable  Post vital signs: Reviewed and stable  Complications: No apparent anesthesia complications

## 2014-02-20 NOTE — Anesthesia Postprocedure Evaluation (Signed)
  Anesthesia Post-op Note  Patient: Catherine [name]  Procedure(s) Performed: Procedure(s) (LRB): CYSTOSCOPY/HYDRODISTENSION WITH CHLOROPACTIN AND MARCAINE INSTILLATION (N/A)  Patient Location: PACU  Anesthesia Type: General  Level of Consciousness: awake and alert   Airway and Oxygen Therapy: Patient Spontanous Breathing  Post-op Pain: mild  Post-op Assessment: Post-op Vital signs reviewed, Patient's Cardiovascular Status Stable, Respiratory Function Stable, Patent Airway and No signs of Nausea or vomiting  Last Vitals:  Filed Vitals:   02/20/14 1300  BP: 133/86  Pulse: 74  Temp:   Resp: 14    Post-op Vital Signs: stable   Complications: No apparent anesthesia complications

## 2014-02-20 NOTE — Op Note (Signed)
Preoperative diagnosis: Female pelvic pain Postoperative diagnosis: Same  Procedure: Cystoscopy with hydraulic overdistention of the bladder   Surgeon: Bernestine Amass M.D.  Anesthesia: Gen.  Indications: Patient is 68 years of age. She is complaining of months of bladder pelvic and urethral discomfort. She is typically had significant nocturia and frequency. Urinalyses have been clear. Despite these ongoing symptoms a specific etiology has not been determined. She presents now for cystoscopy to rule out other bladder pathology and to evaluate her for the potential of interstitial cystitis/painful bladder syndrome.     Technique and findings: Patient was brought the operating room where she had successful induction general anesthesia. She was placed in lithotomy position prepped and draped in usual manner. Appropriate surgical timeout was performed. Bladder was carefully panendoscoped. No significant urethral or bladder pathology was appreciated. She underwent hydraulic overdistention of the bladder to 80 cm water pressure. Capacity was close to 2500 cc. There is no evidence of any glomerular hemorrhaging. Nothing to suggest interstitial cystitis. Marcaine was instilled in her bladder at the end of the procedure. She was brought to recovery room stable condition having had no obvious complications or problems.

## 2014-02-20 NOTE — Discharge Instructions (Signed)

## 2014-02-20 NOTE — Anesthesia Preprocedure Evaluation (Signed)
Anesthesia Evaluation  Patient identified by MRN, date of birth, ID band Patient awake    Reviewed: Allergy & Precautions, H&P , NPO status , Patient's Chart, lab work & pertinent test results  Airway Mallampati: II TM Distance: >3 FB Neck ROM: Full    Dental no notable dental hx.    Pulmonary COPD COPD inhaler, Current Smoker,  breath sounds clear to auscultation  Pulmonary exam normal       Cardiovascular hypertension, Pt. on medications Rhythm:Regular Rate:Normal     Neuro/Psych negative neurological ROS  negative psych ROS   GI/Hepatic Neg liver ROS, GERD-  Medicated,  Endo/Other  negative endocrine ROS  Renal/GU negative Renal ROS  negative genitourinary   Musculoskeletal negative musculoskeletal ROS (+)   Abdominal   Peds negative pediatric ROS (+)  Hematology negative hematology ROS (+)   Anesthesia Other Findings   Reproductive/Obstetrics negative OB ROS                           Anesthesia Physical Anesthesia Plan  ASA: III  Anesthesia Plan: General   Post-op Pain Management:    Induction: Intravenous  Airway Management Planned: LMA  Additional Equipment:   Intra-op Plan:   Post-operative Plan: Extubation in OR  Informed Consent: I have reviewed the patients History and Physical, chart, labs and discussed the procedure including the risks, benefits and alternatives for the proposed anesthesia with the patient or authorized representative who has indicated his/her understanding and acceptance.   Dental advisory given  Plan Discussed with: CRNA and Surgeon  Anesthesia Plan Comments:         Anesthesia Quick Evaluation

## 2014-02-21 ENCOUNTER — Encounter (HOSPITAL_BASED_OUTPATIENT_CLINIC_OR_DEPARTMENT_OTHER): Payer: Self-pay | Admitting: Urology

## 2014-03-01 DIAGNOSIS — M47814 Spondylosis without myelopathy or radiculopathy, thoracic region: Secondary | ICD-10-CM | POA: Diagnosis not present

## 2014-03-01 DIAGNOSIS — M9981 Other biomechanical lesions of cervical region: Secondary | ICD-10-CM | POA: Diagnosis not present

## 2014-03-01 DIAGNOSIS — M999 Biomechanical lesion, unspecified: Secondary | ICD-10-CM | POA: Diagnosis not present

## 2014-03-01 DIAGNOSIS — M5126 Other intervertebral disc displacement, lumbar region: Secondary | ICD-10-CM | POA: Diagnosis not present

## 2014-03-01 DIAGNOSIS — S139XXA Sprain of joints and ligaments of unspecified parts of neck, initial encounter: Secondary | ICD-10-CM | POA: Diagnosis not present

## 2014-03-08 DIAGNOSIS — M9981 Other biomechanical lesions of cervical region: Secondary | ICD-10-CM | POA: Diagnosis not present

## 2014-03-08 DIAGNOSIS — M47814 Spondylosis without myelopathy or radiculopathy, thoracic region: Secondary | ICD-10-CM | POA: Diagnosis not present

## 2014-03-08 DIAGNOSIS — M999 Biomechanical lesion, unspecified: Secondary | ICD-10-CM | POA: Diagnosis not present

## 2014-03-08 DIAGNOSIS — M5126 Other intervertebral disc displacement, lumbar region: Secondary | ICD-10-CM | POA: Diagnosis not present

## 2014-03-08 DIAGNOSIS — S139XXA Sprain of joints and ligaments of unspecified parts of neck, initial encounter: Secondary | ICD-10-CM | POA: Diagnosis not present

## 2014-03-14 DIAGNOSIS — N3020 Other chronic cystitis without hematuria: Secondary | ICD-10-CM | POA: Diagnosis not present

## 2014-03-14 DIAGNOSIS — R3989 Other symptoms and signs involving the genitourinary system: Secondary | ICD-10-CM | POA: Diagnosis not present

## 2014-03-14 DIAGNOSIS — N949 Unspecified condition associated with female genital organs and menstrual cycle: Secondary | ICD-10-CM | POA: Diagnosis not present

## 2014-03-15 ENCOUNTER — Ambulatory Visit: Payer: Medicare Other

## 2014-03-15 DIAGNOSIS — J45901 Unspecified asthma with (acute) exacerbation: Secondary | ICD-10-CM | POA: Diagnosis not present

## 2014-03-15 DIAGNOSIS — J329 Chronic sinusitis, unspecified: Secondary | ICD-10-CM | POA: Diagnosis not present

## 2014-03-24 DIAGNOSIS — M5126 Other intervertebral disc displacement, lumbar region: Secondary | ICD-10-CM | POA: Diagnosis not present

## 2014-03-24 DIAGNOSIS — M999 Biomechanical lesion, unspecified: Secondary | ICD-10-CM | POA: Diagnosis not present

## 2014-03-24 DIAGNOSIS — S139XXA Sprain of joints and ligaments of unspecified parts of neck, initial encounter: Secondary | ICD-10-CM | POA: Diagnosis not present

## 2014-03-24 DIAGNOSIS — M47814 Spondylosis without myelopathy or radiculopathy, thoracic region: Secondary | ICD-10-CM | POA: Diagnosis not present

## 2014-03-24 DIAGNOSIS — M9981 Other biomechanical lesions of cervical region: Secondary | ICD-10-CM | POA: Diagnosis not present

## 2014-03-28 ENCOUNTER — Ambulatory Visit: Payer: Medicare Other

## 2014-03-29 DIAGNOSIS — M999 Biomechanical lesion, unspecified: Secondary | ICD-10-CM | POA: Diagnosis not present

## 2014-03-29 DIAGNOSIS — S139XXA Sprain of joints and ligaments of unspecified parts of neck, initial encounter: Secondary | ICD-10-CM | POA: Diagnosis not present

## 2014-03-29 DIAGNOSIS — M5126 Other intervertebral disc displacement, lumbar region: Secondary | ICD-10-CM | POA: Diagnosis not present

## 2014-03-29 DIAGNOSIS — M9981 Other biomechanical lesions of cervical region: Secondary | ICD-10-CM | POA: Diagnosis not present

## 2014-03-29 DIAGNOSIS — M47814 Spondylosis without myelopathy or radiculopathy, thoracic region: Secondary | ICD-10-CM | POA: Diagnosis not present

## 2014-03-31 ENCOUNTER — Encounter (INDEPENDENT_AMBULATORY_CARE_PROVIDER_SITE_OTHER): Payer: Self-pay

## 2014-03-31 ENCOUNTER — Ambulatory Visit
Admission: RE | Admit: 2014-03-31 | Discharge: 2014-03-31 | Disposition: A | Discharge status: Home / Self Care | Payer: Medicare Other | Source: Ambulatory Visit

## 2014-03-31 DIAGNOSIS — Z1231 Encounter for screening mammogram for malignant neoplasm of breast: Secondary | ICD-10-CM

## 2014-04-04 DIAGNOSIS — K219 Gastro-esophageal reflux disease without esophagitis: Secondary | ICD-10-CM | POA: Diagnosis not present

## 2014-04-04 DIAGNOSIS — M899 Disorder of bone, unspecified: Secondary | ICD-10-CM | POA: Diagnosis not present

## 2014-04-04 DIAGNOSIS — E559 Vitamin D deficiency, unspecified: Secondary | ICD-10-CM | POA: Diagnosis not present

## 2014-04-04 DIAGNOSIS — I10 Essential (primary) hypertension: Secondary | ICD-10-CM | POA: Diagnosis not present

## 2014-04-04 DIAGNOSIS — Z79899 Other long term (current) drug therapy: Secondary | ICD-10-CM | POA: Diagnosis not present

## 2014-04-04 DIAGNOSIS — M949 Disorder of cartilage, unspecified: Secondary | ICD-10-CM | POA: Diagnosis not present

## 2014-04-04 DIAGNOSIS — F411 Generalized anxiety disorder: Secondary | ICD-10-CM | POA: Diagnosis not present

## 2014-04-04 DIAGNOSIS — Z0000 Encounter for general adult medical examination without abnormal findings: Secondary | ICD-10-CM | POA: Diagnosis not present

## 2014-04-04 DIAGNOSIS — Z23 Encounter for immunization: Secondary | ICD-10-CM | POA: Diagnosis not present

## 2014-04-04 DIAGNOSIS — J449 Chronic obstructive pulmonary disease, unspecified: Secondary | ICD-10-CM | POA: Diagnosis not present

## 2014-04-04 DIAGNOSIS — Z86010 Personal history of colonic polyps: Secondary | ICD-10-CM | POA: Diagnosis not present

## 2014-04-14 DIAGNOSIS — R079 Chest pain, unspecified: Secondary | ICD-10-CM | POA: Diagnosis not present

## 2014-04-14 DIAGNOSIS — N3000 Acute cystitis without hematuria: Secondary | ICD-10-CM | POA: Diagnosis not present

## 2014-04-27 DIAGNOSIS — Z23 Encounter for immunization: Secondary | ICD-10-CM | POA: Diagnosis not present

## 2014-04-27 DIAGNOSIS — R072 Precordial pain: Secondary | ICD-10-CM | POA: Diagnosis not present

## 2014-04-27 DIAGNOSIS — K219 Gastro-esophageal reflux disease without esophagitis: Secondary | ICD-10-CM | POA: Diagnosis not present

## 2014-05-08 ENCOUNTER — Encounter: Payer: Self-pay | Admitting: Cardiovascular Disease

## 2014-05-08 ENCOUNTER — Ambulatory Visit (INDEPENDENT_AMBULATORY_CARE_PROVIDER_SITE_OTHER): Payer: Medicare Other | Admitting: Cardiovascular Disease

## 2014-05-08 VITALS — BP 120/74 | HR 73 | Wt 150.8 lb

## 2014-05-08 DIAGNOSIS — R0789 Other chest pain: Secondary | ICD-10-CM | POA: Diagnosis not present

## 2014-05-08 NOTE — Progress Notes (Signed)
Catherine [name] Date of Birth  04-Nov-1945       [facility]    [employer] [address], [place], [place]  [address]   [place], [place]  [address] [place]   Fax  [phone]     Fax [phone]  Problem List: 1. Chest pain 2. dizziness  History of Present Illness:  Catherine [name] is a 68 yo who is here today as a self referral for evaluation of CP and dizziness.  1 month ago - felt like she was having lots of CP - upper left  Chest.  Several days a week.  Would last all day - would send her to bed. No related to any exercise, not related to eating or drinking or taking a deep breath.    Would get anxious - would take Ativan. She now has noticed that she has these symptoms after lunch every day - now feels more like a GI issue.  Would resolve after taking TUMS .   Has lost 20 lbs since January.     She has seen Dr. Kalman Shan. - has an EGD scheduled for Oct. 23. Does not exercise regularly.  Active. Able to go up and down stairs without problems.       Current Outpatient Prescriptions on File Prior to Visit  Medication Sig Dispense Refill  . acetaminophen (TYLENOL) 500 MG tablet Take 1,000 mg by mouth every 6 (six) hours as needed.      . Budesonide-Formoterol Fumarate (SYMBICORT IN) Inhale 2 puffs into the lungs 2 (two) times daily.       Marland Kitchen levalbuterol (XOPENEX HFA) 45 MCG/ACT inhaler Inhale into the lungs every 4 (four) hours as needed for wheezing.      Marland Kitchen LORazepam (ATIVAN) 1 MG tablet Take 1 mg by mouth at bedtime as needed.       . montelukast (SINGULAIR) 10 MG tablet Take 10 mg by mouth every morning.      . Probiotic Product (ALIGN) 4 MG CAPS Take 1 capsule by mouth daily.      . SUMAtriptan (IMITREX) 25 MG tablet Take 25 mg by mouth every 2 (two) hours as needed for migraine or headache. May repeat in 2 hours if headache persists or recurs.       No current facility-administered medications on file  prior to visit.    Allergies  Allergen Reactions  . Penicillins Rash  . Sulfa Antibiotics Rash    Past Medical History  Diagnosis Date  . Hypertension   . GERD (gastroesophageal reflux disease)   . Pelvic pain in female   . COPD (chronic obstructive pulmonary disease)   . Smokers' cough   . Migraine   . Chronic constipation   . Frequency of urination   . Urgency of urination   . Dysuria   . Arthritis     HANDS  . Wears glasses     Past Surgical History  Procedure Laterality Date  . Cholecystectomy open  [place]  . Laparoscopic unilateral salpingoophorectomy  1984  . Colonoscopy  2002  &  12-2012  . Abdominal hysterectomy  1982    W/  UNILATERAL SALPINGOOPHORECTOMY  . Cysto with hydrodistension N/A 02/20/2014    Procedure: [place] AND MARCAINE INSTILLATION;  Surgeon: Bernestine Amass, MD;  Location: [facility];  Service: Urology;  Laterality: N/A;    History  Smoking status  . Current Every Day Smoker -- 0.75 packs/day for 48 years  . Types: Cigarettes  Smokeless tobacco  . Never Used    History  Alcohol Use No    No family history on file.  Reviw of Systems:  Reviewed in the HPI.  All other systems are negative.  Physical Exam: Blood pressure 120/74, pulse 73, weight 150 lb 12.8 oz (68.402 kg). Wt Readings from Last 3 Encounters:  05/08/14 150 lb 12.8 oz (68.402 kg)  02/20/14 151 lb 8 oz (68.72 kg)  02/20/14 151 lb 8 oz (68.72 kg)     General: Well developed, well nourished, in no acute distress.  Head: Normocephalic, atraumatic, sclera non-icteric, mucus membranes are moist,   Neck: Supple. Carotids are 2 + without bruits. No JVD   Lungs: Clear   Heart: RR , normal S1S2  Abdomen: Soft, non-tender, non-distended with normal bowel sounds.  Msk:  Strength and tone are normal   Extremities: No clubbing or cyanosis. No edema.  Distal pedal pulses are 2+ and equal    Neuro:  CN II - XII intact.  Alert and oriented X 3.   Psych:  Normal   ECG: 05/08/2014 :  NSR at 73.  No ST or T wave changes.   Assessment / Plan:

## 2014-05-08 NOTE — Patient Instructions (Signed)
Your physician recommends that you schedule a follow-up appointment in: as needed with Dr. Acie Fredrickson.

## 2014-05-08 NOTE — Assessment & Plan Note (Signed)
Catherine [name] presents today with atypical chest pain. Her symptoms that sound more like a gastric ulcer. The pains occur 2-3 times a week and last all day long.  The pains are exacerbated by eating or not brought on by exercise, or taking a deep breath.  Her EKG is normal.   At this point I think her likelihood of having significant coronary artery disease is very low. She will keep her appointment with Dr. Cristina Gong.    I  will see her on an as needed basis.

## 2014-05-10 DIAGNOSIS — M9903 Segmental and somatic dysfunction of lumbar region: Secondary | ICD-10-CM | POA: Diagnosis not present

## 2014-05-10 DIAGNOSIS — M9902 Segmental and somatic dysfunction of thoracic region: Secondary | ICD-10-CM | POA: Diagnosis not present

## 2014-05-10 DIAGNOSIS — S134XXA Sprain of ligaments of cervical spine, initial encounter: Secondary | ICD-10-CM | POA: Diagnosis not present

## 2014-05-10 DIAGNOSIS — M47814 Spondylosis without myelopathy or radiculopathy, thoracic region: Secondary | ICD-10-CM | POA: Diagnosis not present

## 2014-05-10 DIAGNOSIS — M5126 Other intervertebral disc displacement, lumbar region: Secondary | ICD-10-CM | POA: Diagnosis not present

## 2014-05-10 DIAGNOSIS — M9901 Segmental and somatic dysfunction of cervical region: Secondary | ICD-10-CM | POA: Diagnosis not present

## 2014-05-17 DIAGNOSIS — M5126 Other intervertebral disc displacement, lumbar region: Secondary | ICD-10-CM | POA: Diagnosis not present

## 2014-05-17 DIAGNOSIS — M9903 Segmental and somatic dysfunction of lumbar region: Secondary | ICD-10-CM | POA: Diagnosis not present

## 2014-05-17 DIAGNOSIS — S134XXA Sprain of ligaments of cervical spine, initial encounter: Secondary | ICD-10-CM | POA: Diagnosis not present

## 2014-05-17 DIAGNOSIS — M9901 Segmental and somatic dysfunction of cervical region: Secondary | ICD-10-CM | POA: Diagnosis not present

## 2014-05-17 DIAGNOSIS — M47814 Spondylosis without myelopathy or radiculopathy, thoracic region: Secondary | ICD-10-CM | POA: Diagnosis not present

## 2014-05-17 DIAGNOSIS — M9902 Segmental and somatic dysfunction of thoracic region: Secondary | ICD-10-CM | POA: Diagnosis not present

## 2014-05-24 DIAGNOSIS — K30 Functional dyspepsia: Secondary | ICD-10-CM | POA: Diagnosis not present

## 2014-05-24 DIAGNOSIS — K3189 Other diseases of stomach and duodenum: Secondary | ICD-10-CM | POA: Diagnosis not present

## 2014-05-24 DIAGNOSIS — K219 Gastro-esophageal reflux disease without esophagitis: Secondary | ICD-10-CM | POA: Diagnosis not present

## 2014-05-31 DIAGNOSIS — M9901 Segmental and somatic dysfunction of cervical region: Secondary | ICD-10-CM | POA: Diagnosis not present

## 2014-05-31 DIAGNOSIS — M5126 Other intervertebral disc displacement, lumbar region: Secondary | ICD-10-CM | POA: Diagnosis not present

## 2014-05-31 DIAGNOSIS — S134XXA Sprain of ligaments of cervical spine, initial encounter: Secondary | ICD-10-CM | POA: Diagnosis not present

## 2014-05-31 DIAGNOSIS — M9902 Segmental and somatic dysfunction of thoracic region: Secondary | ICD-10-CM | POA: Diagnosis not present

## 2014-05-31 DIAGNOSIS — M47814 Spondylosis without myelopathy or radiculopathy, thoracic region: Secondary | ICD-10-CM | POA: Diagnosis not present

## 2014-05-31 DIAGNOSIS — M9903 Segmental and somatic dysfunction of lumbar region: Secondary | ICD-10-CM | POA: Diagnosis not present

## 2014-06-20 DIAGNOSIS — J029 Acute pharyngitis, unspecified: Secondary | ICD-10-CM | POA: Diagnosis not present

## 2014-06-20 DIAGNOSIS — J309 Allergic rhinitis, unspecified: Secondary | ICD-10-CM | POA: Diagnosis not present

## 2014-06-21 DIAGNOSIS — M9903 Segmental and somatic dysfunction of lumbar region: Secondary | ICD-10-CM | POA: Diagnosis not present

## 2014-06-21 DIAGNOSIS — M5126 Other intervertebral disc displacement, lumbar region: Secondary | ICD-10-CM | POA: Diagnosis not present

## 2014-06-21 DIAGNOSIS — M9901 Segmental and somatic dysfunction of cervical region: Secondary | ICD-10-CM | POA: Diagnosis not present

## 2014-06-21 DIAGNOSIS — M9902 Segmental and somatic dysfunction of thoracic region: Secondary | ICD-10-CM | POA: Diagnosis not present

## 2014-06-21 DIAGNOSIS — S134XXA Sprain of ligaments of cervical spine, initial encounter: Secondary | ICD-10-CM | POA: Diagnosis not present

## 2014-06-21 DIAGNOSIS — M47814 Spondylosis without myelopathy or radiculopathy, thoracic region: Secondary | ICD-10-CM | POA: Diagnosis not present

## 2014-06-23 DIAGNOSIS — R143 Flatulence: Secondary | ICD-10-CM | POA: Diagnosis not present

## 2014-06-23 DIAGNOSIS — R1013 Epigastric pain: Secondary | ICD-10-CM | POA: Diagnosis not present

## 2014-06-23 DIAGNOSIS — R634 Abnormal weight loss: Secondary | ICD-10-CM | POA: Diagnosis not present

## 2014-06-23 DIAGNOSIS — K2960 Other gastritis without bleeding: Secondary | ICD-10-CM | POA: Diagnosis not present

## 2014-07-19 DIAGNOSIS — M9901 Segmental and somatic dysfunction of cervical region: Secondary | ICD-10-CM | POA: Diagnosis not present

## 2014-07-19 DIAGNOSIS — M5032 Other cervical disc degeneration, mid-cervical region: Secondary | ICD-10-CM | POA: Diagnosis not present

## 2014-07-19 DIAGNOSIS — M5126 Other intervertebral disc displacement, lumbar region: Secondary | ICD-10-CM | POA: Diagnosis not present

## 2014-07-19 DIAGNOSIS — M9902 Segmental and somatic dysfunction of thoracic region: Secondary | ICD-10-CM | POA: Diagnosis not present

## 2014-07-19 DIAGNOSIS — M9903 Segmental and somatic dysfunction of lumbar region: Secondary | ICD-10-CM | POA: Diagnosis not present

## 2014-07-19 DIAGNOSIS — M47814 Spondylosis without myelopathy or radiculopathy, thoracic region: Secondary | ICD-10-CM | POA: Diagnosis not present

## 2014-07-24 DIAGNOSIS — J441 Chronic obstructive pulmonary disease with (acute) exacerbation: Secondary | ICD-10-CM | POA: Diagnosis not present

## 2014-08-11 DIAGNOSIS — L821 Other seborrheic keratosis: Secondary | ICD-10-CM | POA: Diagnosis not present

## 2014-08-11 DIAGNOSIS — B079 Viral wart, unspecified: Secondary | ICD-10-CM | POA: Diagnosis not present

## 2015-01-26 ENCOUNTER — Other Ambulatory Visit: Payer: Self-pay | Admitting: Physician Assistant

## 2015-01-26 DIAGNOSIS — K5900 Constipation, unspecified: Secondary | ICD-10-CM

## 2015-01-26 DIAGNOSIS — R1032 Left lower quadrant pain: Secondary | ICD-10-CM

## 2015-01-31 ENCOUNTER — Ambulatory Visit
Admission: RE | Admit: 2015-01-31 | Discharge: 2015-01-31 | Disposition: A | Discharge status: Home / Self Care | Payer: PPO | Source: Ambulatory Visit | Attending: Physician Assistant | Admitting: Physician Assistant

## 2015-01-31 DIAGNOSIS — K5900 Constipation, unspecified: Secondary | ICD-10-CM

## 2015-01-31 DIAGNOSIS — R1032 Left lower quadrant pain: Secondary | ICD-10-CM

## 2015-01-31 MED ORDER — IOPAMIDOL (ISOVUE-300) INJECTION 61%
100.0000 mL | Freq: Once | INTRAVENOUS | Status: AC | PRN
  Administered 2015-01-31: 100 mL via INTRAVENOUS

## 2015-02-27 ENCOUNTER — Other Ambulatory Visit: Payer: Self-pay | Admitting: Family Medicine

## 2015-02-27 ENCOUNTER — Other Ambulatory Visit: Payer: Self-pay

## 2015-02-27 DIAGNOSIS — E2839 Other primary ovarian failure: Secondary | ICD-10-CM

## 2015-02-27 DIAGNOSIS — Z1231 Encounter for screening mammogram for malignant neoplasm of breast: Secondary | ICD-10-CM

## 2015-04-10 ENCOUNTER — Ambulatory Visit: Payer: PPO

## 2015-04-11 ENCOUNTER — Ambulatory Visit: Payer: PPO

## 2015-04-11 ENCOUNTER — Other Ambulatory Visit: Payer: PPO

## 2015-04-13 ENCOUNTER — Ambulatory Visit
Admission: RE | Admit: 2015-04-13 | Discharge: 2015-04-13 | Disposition: A | Discharge status: Home / Self Care | Payer: PPO | Source: Ambulatory Visit

## 2015-04-13 ENCOUNTER — Other Ambulatory Visit: Payer: PPO

## 2015-04-13 DIAGNOSIS — Z1231 Encounter for screening mammogram for malignant neoplasm of breast: Secondary | ICD-10-CM

## 2015-06-12 ENCOUNTER — Other Ambulatory Visit: Payer: PPO

## 2015-07-19 ENCOUNTER — Ambulatory Visit
Admission: RE | Admit: 2015-07-19 | Discharge: 2015-07-19 | Disposition: A | Discharge status: Home / Self Care | Payer: PPO | Source: Ambulatory Visit | Attending: Family Medicine | Admitting: Family Medicine

## 2015-07-19 DIAGNOSIS — E2839 Other primary ovarian failure: Secondary | ICD-10-CM

## 2015-08-09 DIAGNOSIS — J0100 Acute maxillary sinusitis, unspecified: Secondary | ICD-10-CM | POA: Diagnosis not present

## 2015-10-01 DIAGNOSIS — F322 Major depressive disorder, single episode, severe without psychotic features: Secondary | ICD-10-CM | POA: Diagnosis not present

## 2015-10-01 DIAGNOSIS — J449 Chronic obstructive pulmonary disease, unspecified: Secondary | ICD-10-CM | POA: Diagnosis not present

## 2015-10-01 DIAGNOSIS — M8580 Other specified disorders of bone density and structure, unspecified site: Secondary | ICD-10-CM | POA: Diagnosis not present

## 2015-10-01 DIAGNOSIS — R6889 Other general symptoms and signs: Secondary | ICD-10-CM | POA: Diagnosis not present

## 2015-10-17 DIAGNOSIS — D239 Other benign neoplasm of skin, unspecified: Secondary | ICD-10-CM | POA: Diagnosis not present

## 2015-10-17 DIAGNOSIS — L820 Inflamed seborrheic keratosis: Secondary | ICD-10-CM | POA: Diagnosis not present

## 2015-10-30 DIAGNOSIS — H9113 Presbycusis, bilateral: Secondary | ICD-10-CM | POA: Insufficient documentation

## 2015-10-30 DIAGNOSIS — H903 Sensorineural hearing loss, bilateral: Secondary | ICD-10-CM | POA: Insufficient documentation

## 2015-10-30 DIAGNOSIS — J449 Chronic obstructive pulmonary disease, unspecified: Secondary | ICD-10-CM | POA: Diagnosis not present

## 2015-10-30 DIAGNOSIS — J302 Other seasonal allergic rhinitis: Secondary | ICD-10-CM | POA: Diagnosis not present

## 2015-10-30 DIAGNOSIS — F17210 Nicotine dependence, cigarettes, uncomplicated: Secondary | ICD-10-CM | POA: Diagnosis not present

## 2015-10-30 DIAGNOSIS — H6981 Other specified disorders of Eustachian tube, right ear: Secondary | ICD-10-CM | POA: Insufficient documentation

## 2015-10-30 DIAGNOSIS — H9313 Tinnitus, bilateral: Secondary | ICD-10-CM | POA: Insufficient documentation

## 2015-11-05 DIAGNOSIS — R1909 Other intra-abdominal and pelvic swelling, mass and lump: Secondary | ICD-10-CM | POA: Diagnosis not present

## 2015-11-05 DIAGNOSIS — R1031 Right lower quadrant pain: Secondary | ICD-10-CM | POA: Diagnosis not present

## 2015-11-05 DIAGNOSIS — M542 Cervicalgia: Secondary | ICD-10-CM | POA: Diagnosis not present

## 2015-11-08 ENCOUNTER — Other Ambulatory Visit: Payer: Self-pay | Admitting: Family Medicine

## 2015-11-08 DIAGNOSIS — R1909 Other intra-abdominal and pelvic swelling, mass and lump: Secondary | ICD-10-CM

## 2015-11-12 ENCOUNTER — Ambulatory Visit
Admission: RE | Admit: 2015-11-12 | Discharge: 2015-11-12 | Disposition: A | Discharge status: Home / Self Care | Payer: PPO | Source: Ambulatory Visit | Attending: Family Medicine | Admitting: Family Medicine

## 2015-11-12 DIAGNOSIS — R1909 Other intra-abdominal and pelvic swelling, mass and lump: Secondary | ICD-10-CM

## 2015-11-20 DIAGNOSIS — J449 Chronic obstructive pulmonary disease, unspecified: Secondary | ICD-10-CM | POA: Diagnosis not present

## 2015-11-20 DIAGNOSIS — J309 Allergic rhinitis, unspecified: Secondary | ICD-10-CM | POA: Diagnosis not present

## 2015-11-20 DIAGNOSIS — R1909 Other intra-abdominal and pelvic swelling, mass and lump: Secondary | ICD-10-CM | POA: Diagnosis not present

## 2015-12-04 DIAGNOSIS — R599 Enlarged lymph nodes, unspecified: Secondary | ICD-10-CM | POA: Diagnosis not present

## 2015-12-04 DIAGNOSIS — K5909 Other constipation: Secondary | ICD-10-CM | POA: Diagnosis not present

## 2015-12-04 DIAGNOSIS — K644 Residual hemorrhoidal skin tags: Secondary | ICD-10-CM | POA: Diagnosis not present

## 2015-12-19 DIAGNOSIS — J45909 Unspecified asthma, uncomplicated: Secondary | ICD-10-CM | POA: Diagnosis not present

## 2016-01-07 ENCOUNTER — Ambulatory Visit: Payer: Self-pay | Admitting: Surgery

## 2016-01-07 DIAGNOSIS — Z720 Tobacco use: Secondary | ICD-10-CM | POA: Diagnosis not present

## 2016-01-07 DIAGNOSIS — R599 Enlarged lymph nodes, unspecified: Secondary | ICD-10-CM | POA: Diagnosis not present

## 2016-01-07 DIAGNOSIS — K5909 Other constipation: Secondary | ICD-10-CM | POA: Diagnosis not present

## 2016-01-07 NOTE — H&P (Signed)
Catherine [name]. [place] 01/07/2016 11:00 AM Location: [facility] Patient #: [id number] DOB: 20-Dec-1945 Married / Language: English / Race: White Female  History of Present Illness [name] MD; 01/07/2016 11:27 AM) The patient is a 70 year old female who presents with lymphadenopathy. Note for "Lymphadenopathy": The patient is a 70 year old female who presents with lymphadenopathy.  Note for "Lymphadenopathy": Patient sent for surgical consultation at the request of her primary care physician, Dr. Jonathon Jordan. Concern for right groin mass. Probable enlarged lymph node.  Pleasant active retired female. Notice some swelling in her right groin a few months ago. Was painful. Red. Uncomfortable. Does not recall any scratch or trauma. She does not guard. Pats. No history of swelling or problems in the past. It worsened. Concerning. Saw primary care physician. Placed on ciprofloxacin for 10 days. Initially about 10 days ago. The swelling has gone down. Its much less uncomfortable. Just mildly noticeable if he bends or sits for too long. Was concerned. Surgical consultation requested.   Patient has no fevers chills or sweats. No night sweats. Her activity levels been fine. Her weight has been stable. She does smoke about two thirds of a pack of cigarettes a day. She had hysterectomy and removal our ovaries with 2 surgeries in her 62s for endometriosis and assist. Open cholecystectomy a long time ago. No issues. No history of cancer. Had colonoscopy for colon polyps in the past. Last one last year by Dr. Cristina Gong showed no polyps. No problems with urination or defecation. Apparently has a slightly larger bladder with some urgency and frequency. Has seen a urologist. Dr. Risa Grill. Workup negative for interstitial cystitis or other problems. She wonders if she's had hemorrhoids or anal fissure issues in the past. Did have some discomfort with bowel movements. Nothing  too severe.  No personal nor family history of GI/colon cancer, inflammatory bowel disease, irritable bowel syndrome, allergy such as Celiac Sprue, dietary/dairy problems, colitis, ulcers nor gastritis. No recent sick contacts/gastroenteritis. No travel outside the country. No changes in diet. No dysphagia to solids or liquids. No significant heartburn or reflux. No hematochezia, hematemesis, coffee ground emesis. No evidence of prior gastric/peptic ulceration.  I saw her 4 weeks ago. It seemed to be getting smaller and she was feeling better. I recommended observation to make sure goes away. She notes since gotten larger. Not severely painful but bothersome. It concerns her. No new lumps or bumps anywhere else. She still smokes but is less than a 1/2 pack a day. No fevers or chills. No night sweats.   Problem List/Past Medical [name], MD; 01/07/2016 11:12 AM) ENLARGED LYMPH NODE (R59.9) CHRONIC CONSTIPATION (K59.09) EXTERNAL HEMORRHOIDS WITH COMPLICATION (0000000)  Allergies [name], MD; 01/07/2016 11:12 AM) Sulfabenzamide *CHEMICALS* Penicillin G Benzathine & Proc *PENICILLINS*  Medication History Lars Mage Spillers, CMA; 01/07/2016 11:01 AM) Medications Reconciled    Vitals (Alisha Spillers CMA; 01/07/2016 11:00 AM) 01/07/2016 11:00 AM Weight: 148 lb Height: 70.5in Body Surface Area: 1.85 m Body Mass Index: 20.94 kg/m  Pulse: 80 (Regular)  BP: 142/78 (Sitting, Left Arm, Standard)      Physical Exam [name] MD; 01/07/2016 11:24 AM)  General Mental Status-Alert. General Appearance-Not in acute distress, Not Sickly. Orientation-Oriented X3. Hydration-Well hydrated. Voice-Normal.  Integumentary Global Assessment Normal Exam - Axillae: non-tender, no inflammation or ulceration, no drainage. and Distribution of scalp and body hair is normal. General Characteristics Temperature - normal warmth is noted.  Head and  Neck Head-normocephalic, atraumatic with  no lesions or palpable masses. Face Global Assessment - atraumatic, no absence of expression. Neck Global Assessment - no abnormal movements, no bruit auscultated on the right, no bruit auscultated on the left, no decreased range of motion, non-tender. Trachea-midline. Thyroid Gland Characteristics - non-tender.  Eye Eyeball - Left-Extraocular movements intact, No Nystagmus. Eyeball - Right-Extraocular movements intact, No Nystagmus. Cornea - Left-No Hazy. Cornea - Right-No Hazy. Sclera/Conjunctiva - Left-No scleral icterus, No Discharge. Sclera/Conjunctiva - Right-No scleral icterus, No Discharge. Pupil - Left-Direct reaction to light normal. Pupil - Right-Direct reaction to light normal.  ENMT Ears Pinna - Left - no drainage observed, no generalized tenderness observed. Right - no drainage observed, no generalized tenderness observed. Nose and Sinuses Nose - no destructive lesion observed. Nares - Left - quiet respiration. Right - quiet respiration. Mouth and Throat Lips - Upper Lip - no fissures observed, no pallor noted. Lower Lip - no fissures observed, no pallor noted. Nasopharynx - no discharge present. Oral Cavity/Oropharynx - Tongue - no dryness observed. Oral Mucosa - no cyanosis observed. Hypopharynx - no evidence of airway distress observed.  Chest and Lung Exam Inspection Movements - Normal and Symmetrical. Accessory muscles - No use of accessory muscles in breathing. Palpation Normal exam - Non-tender. Auscultation Breath sounds - Normal and Clear.  Cardiovascular Auscultation Rhythm - Regular. Murmurs & Other Heart Sounds - Normal exam - No Murmurs and No Systolic Clicks.  Abdomen Inspection Normal Exam - No Visible peristalsis and No Abnormal pulsations. Umbilicus - No Bleeding, No Urine drainage. Palpation/Percussion Normal exam - Soft, Non Tender, No Rebound tenderness, No Rigidity (guarding)  and No Cutaneous hyperesthesia. Note: Abdomen soft. Nontender, nondistended. No gurding. No hernias. Well-healed right subcostal and lower abdominal incisions. No hernias. No diastases recti.  Female Genitourinary Sexual Maturity Tanner 5 - Adult hair pattern. Note: Ellipsoid less mobile mostly smooth mass consistent with right inguinal lymph node. 3x3x3 cm. Larger. Mildly sensitive. No fluctuance. No drainage.  No vaginal bleeding nor discharge. Normal external female genitalia.  Rectal Note: Deferred today  Peripheral Vascular Upper Extremity Inspection - Left - No Cyanotic nailbeds, Not Ischemic. Right - No Cyanotic nailbeds, Not Ischemic.  Neurologic Neurologic evaluation reveals -normal attention span and ability to concentrate, able to name objects and repeat phrases. Appropriate fund of knowledge , normal sensation and normal coordination. Mental Status Affect - not angry, not paranoid. Cranial Nerves-Normal Bilaterally. Gait-Normal.  Neuropsychiatric Mental status exam performed with findings of-able to articulate well with normal speech/language, rate, volume and coherence, thought content normal with ability to perform basic computations and apply abstract reasoning and no evidence of hallucinations, delusions, obsessions or homicidal/suicidal ideation.  Musculoskeletal Global Assessment Spine, Ribs and Pelvis - no instability, subluxation or laxity. Right Upper Extremity - no instability, subluxation or laxity.  Lymphatic Head & Neck General Head & Neck Lymphatics: Bilateral - Description - No Localized lymphadenopathy. Axillary General Axillary Region: Bilateral - Description - No Localized lymphadenopathy. Femoral & Inguinal Generalized Femoral & Inguinal Lymphatics: Left: Right - Description - No Localized lymphadenopathy. Description - No Localized lymphadenopathy.    Assessment & Plan [name] MD; 01/07/2016 11:28 AM)  ENLARGED  LYMPH NODE (R59.9) Impression: Persistent right groin mass for at least 3 months, now larger.  I think this requires removal. I would do this in the operating room for assurance of hemostasis and lymphatic control. Outpatient surgery. She would prefer [facility] if possible. She is hoping to do it next week. Don't know if I  can do that soon, but we'll try not to wait too long.  Current Plans You are being scheduled for surgery - Our schedulers will call you.  You should hear from our office's scheduling department within 5 working days about the location, date, and time of surgery. We try to make accommodations for patient's preferences in scheduling surgery, but sometimes the OR schedule or the surgeon's schedule prevents [place] from making those accommodations.  If you have not heard from our office [phone]) in 5 working days, call the office and ask for your surgeon's nurse.  If you have other questions about your diagnosis, plan, or surgery, call the office and ask for your surgeon's nurse.  The pathophysiology of skin & subcutaneous masses was discussed. Natural history risks without surgery were discussed. I recommended surgery to remove the mass. I explained the technique of removal with use of local anesthesia & possible need for more aggressive sedation/anesthesia for patient comfort.  Risks such as bleeding, infection, wound breakdown, heart attack, death, and other risks were discussed. I noted a good likelihood this will help address the problem. Possibility that this will not correct all symptoms was explained. Possibility of regrowth/recurrence of the mass was discussed. We will work to minimize complications. Questions were answered. The patient expresses understanding & wishes to proceed with surgery.  CHRONIC CONSTIPATION (K59.09) Impression: He struggle for most of her life despite her high fiber diet. Uses Senokot and stool softeners.  Recommended a different fiber  supplement such as MiraLAX. She notes it gives her gassiness. Try flaxseed instead. See if that helps.  Current Plans Pt Education - [facility] Good Bowel Health ([place]) TOBACCO ABUSE (Z72.0)  Current Plans Pt Education - [facility] STOP SMOKING!  [name], M.D., F.A.C.S. Gastrointestinal and Minimally Invasive Surgery [facility] [address] [place], [place] [address] [phone] Main / Paging

## 2016-01-17 DIAGNOSIS — R1909 Other intra-abdominal and pelvic swelling, mass and lump: Secondary | ICD-10-CM | POA: Diagnosis not present

## 2016-01-17 DIAGNOSIS — D181 Lymphangioma, any site: Secondary | ICD-10-CM | POA: Diagnosis not present

## 2016-01-17 DIAGNOSIS — M799 Soft tissue disorder, unspecified: Secondary | ICD-10-CM | POA: Diagnosis not present

## 2016-01-25 DIAGNOSIS — L7634 Postprocedural seroma of skin and subcutaneous tissue following other procedure: Secondary | ICD-10-CM | POA: Diagnosis not present

## 2016-01-29 DIAGNOSIS — L7634 Postprocedural seroma of skin and subcutaneous tissue following other procedure: Secondary | ICD-10-CM | POA: Diagnosis not present

## 2016-02-06 DIAGNOSIS — L7634 Postprocedural seroma of skin and subcutaneous tissue following other procedure: Secondary | ICD-10-CM | POA: Diagnosis not present

## 2016-02-20 DIAGNOSIS — R300 Dysuria: Secondary | ICD-10-CM | POA: Diagnosis not present

## 2016-02-27 DIAGNOSIS — L7634 Postprocedural seroma of skin and subcutaneous tissue following other procedure: Secondary | ICD-10-CM | POA: Diagnosis not present

## 2016-03-17 ENCOUNTER — Other Ambulatory Visit: Payer: Self-pay | Admitting: Family Medicine

## 2016-03-17 DIAGNOSIS — Z1231 Encounter for screening mammogram for malignant neoplasm of breast: Secondary | ICD-10-CM

## 2016-03-24 ENCOUNTER — Ambulatory Visit: Payer: BLUE CROSS/BLUE SHIELD | Admitting: Podiatry

## 2016-03-27 ENCOUNTER — Ambulatory Visit: Payer: PPO | Admitting: Podiatry

## 2016-03-31 DIAGNOSIS — J329 Chronic sinusitis, unspecified: Secondary | ICD-10-CM | POA: Diagnosis not present

## 2016-03-31 DIAGNOSIS — R062 Wheezing: Secondary | ICD-10-CM | POA: Diagnosis not present

## 2016-04-14 ENCOUNTER — Ambulatory Visit
Admission: RE | Admit: 2016-04-14 | Discharge: 2016-04-14 | Disposition: A | Discharge status: Home / Self Care | Payer: PPO | Source: Ambulatory Visit | Attending: Family Medicine | Admitting: Family Medicine

## 2016-04-14 DIAGNOSIS — Z1231 Encounter for screening mammogram for malignant neoplasm of breast: Secondary | ICD-10-CM

## 2016-04-15 DIAGNOSIS — Z23 Encounter for immunization: Secondary | ICD-10-CM | POA: Diagnosis not present

## 2016-04-25 DIAGNOSIS — H2513 Age-related nuclear cataract, bilateral: Secondary | ICD-10-CM | POA: Diagnosis not present

## 2016-05-12 DIAGNOSIS — E785 Hyperlipidemia, unspecified: Secondary | ICD-10-CM | POA: Diagnosis not present

## 2016-05-12 DIAGNOSIS — E559 Vitamin D deficiency, unspecified: Secondary | ICD-10-CM | POA: Diagnosis not present

## 2016-05-12 DIAGNOSIS — J449 Chronic obstructive pulmonary disease, unspecified: Secondary | ICD-10-CM | POA: Diagnosis not present

## 2016-05-12 DIAGNOSIS — Z79899 Other long term (current) drug therapy: Secondary | ICD-10-CM | POA: Diagnosis not present

## 2016-05-12 DIAGNOSIS — Z0000 Encounter for general adult medical examination without abnormal findings: Secondary | ICD-10-CM | POA: Diagnosis not present

## 2016-05-12 DIAGNOSIS — Z1159 Encounter for screening for other viral diseases: Secondary | ICD-10-CM | POA: Diagnosis not present

## 2016-05-12 DIAGNOSIS — I10 Essential (primary) hypertension: Secondary | ICD-10-CM | POA: Diagnosis not present

## 2016-05-28 DIAGNOSIS — H2512 Age-related nuclear cataract, left eye: Secondary | ICD-10-CM | POA: Diagnosis not present

## 2016-05-28 DIAGNOSIS — H25812 Combined forms of age-related cataract, left eye: Secondary | ICD-10-CM | POA: Diagnosis not present

## 2016-06-11 DIAGNOSIS — H2511 Age-related nuclear cataract, right eye: Secondary | ICD-10-CM | POA: Diagnosis not present

## 2016-06-11 DIAGNOSIS — H25811 Combined forms of age-related cataract, right eye: Secondary | ICD-10-CM | POA: Diagnosis not present

## 2016-07-14 DIAGNOSIS — R05 Cough: Secondary | ICD-10-CM | POA: Diagnosis not present

## 2016-07-14 DIAGNOSIS — J441 Chronic obstructive pulmonary disease with (acute) exacerbation: Secondary | ICD-10-CM | POA: Diagnosis not present

## 2016-08-14 DIAGNOSIS — J329 Chronic sinusitis, unspecified: Secondary | ICD-10-CM | POA: Diagnosis not present

## 2016-08-27 DIAGNOSIS — L821 Other seborrheic keratosis: Secondary | ICD-10-CM | POA: Diagnosis not present

## 2016-08-27 DIAGNOSIS — D492 Neoplasm of unspecified behavior of bone, soft tissue, and skin: Secondary | ICD-10-CM | POA: Diagnosis not present

## 2016-08-27 DIAGNOSIS — D229 Melanocytic nevi, unspecified: Secondary | ICD-10-CM | POA: Diagnosis not present

## 2016-08-27 DIAGNOSIS — L820 Inflamed seborrheic keratosis: Secondary | ICD-10-CM | POA: Diagnosis not present

## 2016-08-27 DIAGNOSIS — L570 Actinic keratosis: Secondary | ICD-10-CM | POA: Diagnosis not present

## 2016-09-01 DIAGNOSIS — G8929 Other chronic pain: Secondary | ICD-10-CM | POA: Diagnosis not present

## 2016-09-01 DIAGNOSIS — Z720 Tobacco use: Secondary | ICD-10-CM | POA: Diagnosis not present

## 2016-09-01 DIAGNOSIS — R1031 Right lower quadrant pain: Secondary | ICD-10-CM | POA: Diagnosis not present

## 2016-10-02 DIAGNOSIS — R1031 Right lower quadrant pain: Secondary | ICD-10-CM | POA: Diagnosis not present

## 2016-10-02 DIAGNOSIS — G8929 Other chronic pain: Secondary | ICD-10-CM | POA: Diagnosis not present

## 2016-10-07 DIAGNOSIS — J441 Chronic obstructive pulmonary disease with (acute) exacerbation: Secondary | ICD-10-CM | POA: Diagnosis not present

## 2016-10-08 ENCOUNTER — Other Ambulatory Visit: Payer: Self-pay | Admitting: General Surgery

## 2016-10-08 DIAGNOSIS — G8929 Other chronic pain: Secondary | ICD-10-CM

## 2016-10-08 DIAGNOSIS — R1031 Right lower quadrant pain: Principal | ICD-10-CM

## 2016-10-20 ENCOUNTER — Ambulatory Visit
Admission: RE | Admit: 2016-10-20 | Discharge: 2016-10-20 | Disposition: A | Discharge status: Home / Self Care | Payer: PPO | Source: Ambulatory Visit | Attending: General Surgery | Admitting: General Surgery

## 2016-10-20 DIAGNOSIS — G8929 Other chronic pain: Secondary | ICD-10-CM

## 2016-10-20 DIAGNOSIS — R1031 Right lower quadrant pain: Principal | ICD-10-CM

## 2016-10-20 DIAGNOSIS — R1030 Lower abdominal pain, unspecified: Secondary | ICD-10-CM | POA: Diagnosis not present

## 2016-10-20 MED ORDER — IOPAMIDOL (ISOVUE-300) INJECTION 61%: 100.0000 mL | Freq: Once | INTRAVENOUS | Status: DC | PRN

## 2016-10-22 ENCOUNTER — Ambulatory Visit: Payer: Self-pay | Admitting: General Surgery

## 2016-11-04 ENCOUNTER — Ambulatory Visit (INDEPENDENT_AMBULATORY_CARE_PROVIDER_SITE_OTHER): Payer: PPO | Admitting: Neurology

## 2016-11-04 ENCOUNTER — Encounter: Payer: Self-pay | Admitting: Neurology

## 2016-11-04 ENCOUNTER — Encounter (INDEPENDENT_AMBULATORY_CARE_PROVIDER_SITE_OTHER): Payer: Self-pay

## 2016-11-04 DIAGNOSIS — G629 Polyneuropathy, unspecified: Secondary | ICD-10-CM | POA: Diagnosis not present

## 2016-11-04 DIAGNOSIS — R1031 Right lower quadrant pain: Secondary | ICD-10-CM

## 2016-11-04 DIAGNOSIS — R200 Anesthesia of skin: Secondary | ICD-10-CM | POA: Diagnosis not present

## 2016-11-04 DIAGNOSIS — F418 Other specified anxiety disorders: Secondary | ICD-10-CM

## 2016-11-04 MED ORDER — ESCITALOPRAM OXALATE 10 MG PO TABS: 10.0000 mg | ORAL_TABLET | Freq: Every day | ORAL | 5 refills | Status: DC

## 2016-11-04 MED ORDER — ETODOLAC 300 MG PO CAPS: 300.0000 mg | ORAL_CAPSULE | Freq: Three times a day (TID) | ORAL | 5 refills | Status: DC

## 2016-11-04 NOTE — Progress Notes (Signed)
[facility]  PATIENT: Catherine [name] DOB: 1945-10-20  REFERRING DOCTOR OR PCP:   Dr. Zella Richer (Surgery).    Dr. Stephanie Acre is PCP SOURCE: patient  _________________________________   HISTORICAL  CHIEF COMPLAINT:  Chief Complaint  Patient presents with  . Pain    Sts. she had a lymph node/cyst removed from right inguinal region 9 mos. ago.  Had some compications--seroma, etc.  C/O chronic pain that sometimes radiates into right leg.  Prolonged sitting/standing makes pain worse/fim    HISTORY OF PRESENT ILLNESS:  I had the pleasure seeing you patient, Catherine [name], at [facility] for neurologic consultation regarding her right groin pain. She is a 71 year old woman who underwent a procedure to remove a lymph node cyst from the right groin 01/17/2016. She developed a seroma and had a catheter drain placed.    Swelling persisted and pain has worsened.         Pain is usually mild if sitting or laying down.   When she is on her feet longer (like cooking a large meal) she has more pain in the groin and will radiate further down the groin and into the hip.  Wearing a seat belt increases her pain.    Pain is burning and throbbing.   Pushing on the swollen region is tender.     She was prescribe naprosyn 500 mg bid but it upsets her stomach if she takes bid so she usually just takes at lunch with food.  It takes the edge off when pain is mild but is less effective when she stands a while.  She denies any weakness in her legs.   Sometimes she has mild numbness in the feet (R>L)  but it is not painful.   She denies any bladder changes.     Gait and station are fine.  She notes mild depression and moderate anxiety. This worsened over the past year. She has not been on any antidepressants recently.  REVIEW OF SYSTEMS: Constitutional: No fevers, chills, sweats, or change in appetite Eyes: No visual changes, double vision, eye pain Ear, nose and throat: No  hearing loss, ear pain, nasal congestion, sore throat Cardiovascular: No chest pain, palpitations Respiratory: No shortness of breath at rest or with exertion.   No wheezes GastrointestinaI: No nausea, vomiting, diarrhea, abdominal pain, fecal incontinence Genitourinary: No dysuria, urinary retention or frequency.  No nocturia. Musculoskeletal: No neck pain, back pain Integumentary: No rash, pruritus, skin lesions Neurological: as above Psychiatric: Notes anxiety and  depression Endocrine: No palpitations, diaphoresis, change in appetite, change in weigh or increased thirst Hematologic/Lymphatic: No anemia, purpura, petechiae. Allergic/Immunologic: No itchy/runny eyes, nasal congestion, recent allergic reactions, rashes  ALLERGIES: Allergies  Allergen Reactions  . Penicillins Rash  . Sulfa Antibiotics Rash    HOME MEDICATIONS:  Current Outpatient Prescriptions:  .  Budesonide-Formoterol Fumarate (SYMBICORT IN), Inhale 2 puffs into the lungs 2 (two) times daily. , Disp: , Rfl:  .  cetirizine (ZYRTEC) 5 MG chewable tablet, Chew 5 mg by mouth daily., Disp: , Rfl:  .  diltiazem (TAZTIA XT) 360 MG 24 hr capsule, Take 360 mg by mouth daily., Disp: , Rfl:  .  ergocalciferol (VITAMIN D2) 50000 UNITS capsule, Take 50,000 Units by mouth once a week., Disp: , Rfl:  .  esomeprazole (NEXIUM) 40 MG capsule, Take 40 mg by mouth., Disp: , Rfl:  .  fluticasone (FLONASE) 50 MCG/ACT nasal spray, Place into the nose., Disp: , Rfl:  .  LORazepam (  ATIVAN) 1 MG tablet, Take 1 mg by mouth at bedtime as needed. , Disp: , Rfl:  .  montelukast (SINGULAIR) 10 MG tablet, Take 10 mg by mouth every morning., Disp: , Rfl:  .  naproxen (NAPROSYN) 500 MG tablet, Take by mouth., Disp: , Rfl:  .  sucralfate (CARAFATE) 1 g tablet, Take 1 g by mouth., Disp: , Rfl:  .  SUMAtriptan (IMITREX) 25 MG tablet, Take 25 mg by mouth every 2 (two) hours as needed for migraine or headache. May repeat in 2 hours if headache  persists or recurs., Disp: , Rfl:  .  tiotropium (SPIRIVA HANDIHALER) 18 MCG inhalation capsule, Place into inhaler and inhale., Disp: , Rfl:  .  escitalopram (LEXAPRO) 10 MG tablet, Take 1 tablet (10 mg total) by mouth daily., Disp: 30 tablet, Rfl: 5 .  etodolac (LODINE) 300 MG capsule, Take 1 capsule (300 mg total) by mouth every 8 (eight) hours., Disp: 60 capsule, Rfl: 5  PAST MEDICAL HISTORY: Past Medical History:  Diagnosis Date  . Arthritis    HANDS  . Chronic constipation   . COPD (chronic obstructive pulmonary disease) ([place])   . Dysuria   . Frequency of urination   . GERD (gastroesophageal reflux disease)   . Hypertension   . Migraine   . Pelvic pain in female   . Smokers' cough ([place])   . Urgency of urination   . Wears glasses     PAST SURGICAL HISTORY: Past Surgical History:  Procedure Laterality Date  . ABDOMINAL HYSTERECTOMY  1982   W/  UNILATERAL SALPINGOOPHORECTOMY  . CHOLECYSTECTOMY OPEN  1987   W/  APPENDECTOMY  . COLONOSCOPY  2002  &  12-2012  . CYSTO WITH HYDRODISTENSION N/A 02/20/2014   Procedure: [place] AND MARCAINE INSTILLATION;  Surgeon: Bernestine Amass, MD;  Location: [facility];  Service: Urology;  Laterality: N/A;  . LAPAROSCOPIC UNILATERAL SALPINGOOPHORECTOMY  1984    FAMILY HISTORY: No family history on file.  SOCIAL HISTORY:  Social History   Social History  . Marital status: Married    Spouse name: N/A  . Number of children: N/A  . Years of education: N/A   Occupational History  . Not on file.   Social History Main Topics  . Smoking status: Current Every Day Smoker    Packs/day: 0.75    Years: 48.00    Types: Cigarettes  . Smokeless tobacco: Never Used  . Alcohol use No  . Drug use: No  . Sexual activity: Not on file   Other Topics Concern  . Not on file   Social History Narrative  . No narrative on file     PHYSICAL EXAM  Vitals:   11/04/16 1406  BP: 119/77    Pulse: 78  Resp: 16  Weight: 151 lb 8 oz (68.7 kg)  Height: _0  (1.778 m)    Body mass index is 21.74 kg/m.   General: The patient is well-developed and well-nourished and in no acute distress  Eyes:  Funduscopic exam shows normal optic discs and retinal vessels.  Neck: The neck is supple, no carotid bruits are noted.  The neck is nontender.  Cardiovascular: The heart has a regular rate and rhythm with a normal S1 and S2. There were no murmurs, gallops or rubs. Lungs are clear to auscultation.  Skin: Extremities are without significant edema.  Musculoskeletal/groin:  Back is nontender.   Right groin over the femoral artery is tender to deep palpation. There  is no erythema.     Neurologic Exam  Mental status: The patient is alert and oriented x 3 at the time of the examination. The patient has apparent normal recent and remote memory, with an apparently normal attention span and concentration ability.   Speech is normal.  Cranial nerves: Extraocular movements are full. Pupils are equal, round, and reactive to light and accomodation.  Visual fields are full.  Facial symmetry is present. There is good facial sensation to soft touch bilaterally.Facial strength is normal.  Trapezius and sternocleidomastoid strength is normal. No dysarthria is noted.  The tongue is midline, and the patient has symmetric elevation of the soft palate. No obvious hearing deficits are noted.  Motor:  Muscle bulk is normal.   Tone is normal. Strength is  5 / 5 in all 4 extremities.   Sensory: Sensory testing is intact to pinprick, soft touch and vibration sensation in the arms.   She has a sensation in both feet, about 30% at the ankles and 20% at the toes compared to 100% sensation at the knees to vibration.  She also reported mild reduced sensation to pain.    Coordination: Cerebellar testing reveals good finger-nose-finger and heel-to-shin bilaterally.  Gait and station: Station is normal.   Gait is  normal. Tandem gait is normal. Romberg is negative.   Reflexes: Deep tendon reflexes are symmetric and normal in arms an knees and 1+ at ankles   Plantar responses are flexor.    DIAGNOSTIC DATA (LABS, IMAGING, TESTING) - I reviewed patient records, labs, notes, testing and imaging myself where available.  Lab Results  Component Value Date   HGB 14.6 02/20/2014   HCT 43.0 02/20/2014      Component Value Date/Time   NA 142 02/20/2014 1039   K 3.7 02/20/2014 1039   GLUCOSE 104 (H) 02/20/2014 1039       ASSESSMENT AND PLAN  Right groin pain  Neuropathy (HCC) - Plan: Vitamin B12, ANA w/Reflex, Sedimentation rate, Multiple Myeloma Panel (SPEP&IFE w/QIG)  Numbness - Plan: Vitamin B12, ANA w/Reflex, Sedimentation rate, Multiple Myeloma Panel (SPEP&IFE w/QIG)  Depression with anxiety   1.   Her right groin pain has responded somewhat to NSAIDs. However, higher dosages stomach upset made her sleepy. I will switch her to etodolac 300 mg by mouth twice a day to see if she is able to get in 2 doses every day. She has some tramadol left over from her surgery not taken any recently. On days that her pain is very severe, she may do so. 2.   She has numbness in her feet and might have a mild polyneuropathy. We will check some laboratory tests if symptoms worsen, we can do and NCV/EMG to better characterize the process. 3.    For her mild anxiety and depression, I will start Lexapro 10 mg daily. 4.    We will call her with the results of the laboratory tests. She can return as needed if she has significant worsening of her symptoms.,     Thank you for asking me to see Mrs. Petropoulos for neurologic consultation.  Please let me know if I can be of further assistance with her or other patients in the future.   Richard A. Felecia Shelling, MD, PhD 02/04/5328, 9:24 PM Certified in Neurology, Clinical Neurophysiology, Sleep Medicine, Pain Medicine and Neuroimaging  [facility] [address] [place], [place] [address] [phone]

## 2016-11-06 LAB — MULTIPLE MYELOMA PANEL, SERUM
ALBUMIN/GLOB SERPL: 1.7 (ref 0.7–1.7)
Albumin SerPl Elph-Mcnc: 3.9 g/dL (ref 2.9–4.4)
Alpha 1: 0.2 g/dL (ref 0.0–0.4)
Alpha2 Glob SerPl Elph-Mcnc: 0.6 g/dL (ref 0.4–1.0)
B-Globulin SerPl Elph-Mcnc: 0.9 g/dL (ref 0.7–1.3)
GAMMA GLOB SERPL ELPH-MCNC: 0.5 g/dL (ref 0.4–1.8)
GLOBULIN, TOTAL: 2.3 g/dL (ref 2.2–3.9)
IGA/IMMUNOGLOBULIN A, SERUM: 213 mg/dL (ref 87–352)
IGM (IMMUNOGLOBULIN M), SRM: 51 mg/dL (ref 26–217)
IgG (Immunoglobin G), Serum: 556 mg/dL — ABNORMAL LOW (ref 700–1600)
Total Protein: 6.2 g/dL (ref 6.0–8.5)

## 2016-11-06 LAB — SEDIMENTATION RATE: SED RATE: 2 mm/h (ref 0–40)

## 2016-11-06 LAB — ANA W/REFLEX: ANA: NEGATIVE

## 2016-11-06 LAB — VITAMIN B12: Vitamin B-12: 210 pg/mL — ABNORMAL LOW (ref 232–1245)

## 2016-11-07 ENCOUNTER — Telehealth: Payer: Self-pay | Admitting: *Deleted

## 2016-11-07 NOTE — Telephone Encounter (Signed)
-----   Message from Britt Bottom, MD sent at 11/06/2016  5:26 PM EDT ----- Vitamin B12 was low.   She should get 1000 micrograms subcutaneous weekly 4 weeks then monthly. If she does not think that she can give herself shots (or someone else can't), that she'll need to come in for the shots.

## 2016-11-07 NOTE — Telephone Encounter (Signed)
I have spoken with Pam today and per RAS, reviewed below lab results. She verbalized understanding of same, will see pcp for Bit. B12 inj/fim

## 2016-11-10 NOTE — Telephone Encounter (Signed)
I have spoken with Belenda Cruise at Dr. Myer Peer office and given dosing instructions for Vit. B12--1,017mcg SQ once a week for 4 weeks, then 1,060mcg SQ once per month/fim

## 2016-11-10 NOTE — Telephone Encounter (Signed)
Patient calling back stating she spoke to PCP office Dr. Jonathon Jordan regarding giving her B-12 shots but they need something stating when to give and dosage.

## 2016-11-12 DIAGNOSIS — M530 Cervicocranial syndrome: Secondary | ICD-10-CM | POA: Diagnosis not present

## 2016-11-12 DIAGNOSIS — M9901 Segmental and somatic dysfunction of cervical region: Secondary | ICD-10-CM | POA: Diagnosis not present

## 2016-11-12 DIAGNOSIS — M9903 Segmental and somatic dysfunction of lumbar region: Secondary | ICD-10-CM | POA: Diagnosis not present

## 2016-11-12 DIAGNOSIS — M4726 Other spondylosis with radiculopathy, lumbar region: Secondary | ICD-10-CM | POA: Diagnosis not present

## 2016-11-14 DIAGNOSIS — E538 Deficiency of other specified B group vitamins: Secondary | ICD-10-CM | POA: Diagnosis not present

## 2016-11-21 DIAGNOSIS — E538 Deficiency of other specified B group vitamins: Secondary | ICD-10-CM | POA: Diagnosis not present

## 2016-11-24 DIAGNOSIS — M9901 Segmental and somatic dysfunction of cervical region: Secondary | ICD-10-CM | POA: Diagnosis not present

## 2016-11-24 DIAGNOSIS — M9903 Segmental and somatic dysfunction of lumbar region: Secondary | ICD-10-CM | POA: Diagnosis not present

## 2016-11-24 DIAGNOSIS — M530 Cervicocranial syndrome: Secondary | ICD-10-CM | POA: Diagnosis not present

## 2016-11-24 DIAGNOSIS — M4726 Other spondylosis with radiculopathy, lumbar region: Secondary | ICD-10-CM | POA: Diagnosis not present

## 2016-12-10 DIAGNOSIS — M545 Low back pain: Secondary | ICD-10-CM | POA: Diagnosis not present

## 2016-12-23 DIAGNOSIS — M545 Low back pain: Secondary | ICD-10-CM | POA: Diagnosis not present

## 2016-12-23 DIAGNOSIS — M25551 Pain in right hip: Secondary | ICD-10-CM | POA: Diagnosis not present

## 2016-12-24 DIAGNOSIS — E538 Deficiency of other specified B group vitamins: Secondary | ICD-10-CM | POA: Diagnosis not present

## 2016-12-31 ENCOUNTER — Telehealth: Payer: Self-pay | Admitting: *Deleted

## 2016-12-31 DIAGNOSIS — M25551 Pain in right hip: Secondary | ICD-10-CM | POA: Diagnosis not present

## 2016-12-31 DIAGNOSIS — M545 Low back pain: Secondary | ICD-10-CM | POA: Diagnosis not present

## 2016-12-31 NOTE — Telephone Encounter (Signed)
[place].  She has an appt. with RAS on 01/30/17.  He is going to be out of the office that day.  I need to r/s appt. for her/fim

## 2017-01-01 DIAGNOSIS — H43812 Vitreous degeneration, left eye: Secondary | ICD-10-CM | POA: Diagnosis not present

## 2017-01-05 DIAGNOSIS — M25551 Pain in right hip: Secondary | ICD-10-CM | POA: Diagnosis not present

## 2017-01-05 DIAGNOSIS — M545 Low back pain: Secondary | ICD-10-CM | POA: Diagnosis not present

## 2017-01-12 DIAGNOSIS — M25551 Pain in right hip: Secondary | ICD-10-CM | POA: Diagnosis not present

## 2017-01-12 DIAGNOSIS — M545 Low back pain: Secondary | ICD-10-CM | POA: Diagnosis not present

## 2017-01-14 DIAGNOSIS — J069 Acute upper respiratory infection, unspecified: Secondary | ICD-10-CM | POA: Diagnosis not present

## 2017-01-15 ENCOUNTER — Ambulatory Visit: Payer: PPO | Admitting: Neurology

## 2017-01-16 DIAGNOSIS — H26493 Other secondary cataract, bilateral: Secondary | ICD-10-CM | POA: Diagnosis not present

## 2017-01-21 DIAGNOSIS — M25551 Pain in right hip: Secondary | ICD-10-CM | POA: Diagnosis not present

## 2017-01-21 DIAGNOSIS — M545 Low back pain: Secondary | ICD-10-CM | POA: Diagnosis not present

## 2017-01-23 DIAGNOSIS — M25551 Pain in right hip: Secondary | ICD-10-CM | POA: Diagnosis not present

## 2017-01-28 DIAGNOSIS — M545 Low back pain: Secondary | ICD-10-CM | POA: Diagnosis not present

## 2017-01-28 DIAGNOSIS — M25551 Pain in right hip: Secondary | ICD-10-CM | POA: Diagnosis not present

## 2017-01-30 ENCOUNTER — Ambulatory Visit: Payer: PPO | Admitting: Neurology

## 2017-02-17 ENCOUNTER — Telehealth: Payer: Self-pay | Admitting: Neurology

## 2017-02-17 NOTE — Telephone Encounter (Signed)
I have spoken with Pam this afternoon.  She c/o continued right groin pain, also now c/o new right hip pain radiating down right leg, with worsening numbness right foot.  Appt. given tomorrow with RAS/fim

## 2017-02-17 NOTE — Telephone Encounter (Signed)
Patient called office in reference to having hip pain radiating down back of right leg while sitting and right foot is numb all the time.  Symptoms have been going on since May has been going to PT with no help.  Continues to have groin pain.  Please call

## 2017-02-18 ENCOUNTER — Ambulatory Visit (INDEPENDENT_AMBULATORY_CARE_PROVIDER_SITE_OTHER): Payer: PPO | Admitting: Neurology

## 2017-02-18 ENCOUNTER — Encounter: Payer: Self-pay | Admitting: Neurology

## 2017-02-18 VITALS — BP 132/79 | HR 90 | Resp 16 | Ht 70.0 in | Wt 152.5 lb

## 2017-02-18 DIAGNOSIS — R200 Anesthesia of skin: Secondary | ICD-10-CM | POA: Diagnosis not present

## 2017-02-18 DIAGNOSIS — M791 Myalgia: Secondary | ICD-10-CM | POA: Diagnosis not present

## 2017-02-18 DIAGNOSIS — M7918 Myalgia, other site: Secondary | ICD-10-CM | POA: Insufficient documentation

## 2017-02-18 DIAGNOSIS — M5431 Sciatica, right side: Secondary | ICD-10-CM | POA: Diagnosis not present

## 2017-02-18 DIAGNOSIS — R1031 Right lower quadrant pain: Secondary | ICD-10-CM | POA: Diagnosis not present

## 2017-02-18 MED ORDER — TRAMADOL HCL 50 MG PO TABS: 50.0000 mg | ORAL_TABLET | Freq: Two times a day (BID) | ORAL | 3 refills | Status: DC

## 2017-02-18 MED ORDER — LAMOTRIGINE 50 MG PO TBDP: ORAL_TABLET | ORAL | 5 refills | Status: DC

## 2017-02-18 NOTE — Progress Notes (Signed)
[facility]  PATIENT: Catherine [name] DOB: July 12, 1946  REFERRING DOCTOR OR PCP:   Dr. Zella Richer (Surgery).    Dr. Stephanie Acre is PCP SOURCE: patient  _________________________________   HISTORICAL  CHIEF COMPLAINT:  Chief Complaint  Patient presents with  . Right Groin Pain    Pt. sts. she continues to have right groin pain.  Now also having new pain, right hip and right buttock, radiating down back of right leg to the knee.  Right foot is still numb, but left foot numbness resolved with getting Vit. B12 level back up to normal. /fim  . Right Hip/Leg Pain    HISTORY OF PRESENT ILLNESS:  Catherine [name] is a 71 year old woman With right groin pain since undergoing a procedure to remove a lymph node cyst from the right groin 01/17/2016. She developed a seroma and had a catheter drain placed.    Swelling persisted and pain worsened.    A CT scan shows surgical clips associated with the common fenmoral artery and vein.    Current groin pain:   She continues to have a lot of groin pain on the right    Lodine made her sleepy so she is back to Ibuprofen/Aleve and Tylenol.    She never took the tramadol.    Pain intensified more after PT.   Pain is usually mild if sitting or laying down.   When she is on her feet longer (like cooking a large meal) she has more pain in the groin and will radiate further down the groin and into the hip.  Wearing a seat belt increases her pain.    Pain is burning and throbbing.   Pushing on the swollen region is tender.     Hip pain:   She also has pain in the right hip and buttock  region that radiates into the right posterior thigh bit not below the knee.  She also has right foot numbness.    She denies any weakness in her legs.      She denies any bladder changes.     Gait and station are fine.  She notes mild depression and moderate anxiety. This worsened over the past year. She has not been on any antidepressants recently.  She chose not to  start Lexapro.  REVIEW OF SYSTEMS: Constitutional: No fevers, chills, sweats, or change in appetite Eyes: No visual changes, double vision, eye pain Ear, nose and throat: No hearing loss, ear pain, nasal congestion, sore throat Cardiovascular: No chest pain, palpitations Respiratory: No shortness of breath at rest or with exertion.   No wheezes GastrointestinaI: No nausea, vomiting, diarrhea, abdominal pain, fecal incontinence Genitourinary: No dysuria, urinary retention or frequency.  No nocturia. Musculoskeletal: No neck pain, back pain Integumentary: No rash, pruritus, skin lesions Neurological: as above Psychiatric: Notes anxiety and  depression Endocrine: No palpitations, diaphoresis, change in appetite, change in weigh or increased thirst Hematologic/Lymphatic: No anemia, purpura, petechiae. Allergic/Immunologic: No itchy/runny eyes, nasal congestion, recent allergic reactions, rashes  ALLERGIES: Allergies  Allergen Reactions  . Penicillins Rash  . Sulfa Antibiotics Rash    HOME MEDICATIONS:  Current Outpatient Prescriptions:  .  Budesonide-Formoterol Fumarate (SYMBICORT IN), Inhale 2 puffs into the lungs 2 (two) times daily. , Disp: , Rfl:  .  cetirizine (ZYRTEC) 5 MG chewable tablet, Chew 5 mg by mouth daily., Disp: , Rfl:  .  diltiazem (TAZTIA XT) 360 MG 24 hr capsule, Take 360 mg by mouth daily., Disp: , Rfl:  .  ergocalciferol (VITAMIN D2) 50000 UNITS capsule, Take 50,000 Units by mouth once a week., Disp: , Rfl:  .  esomeprazole (NEXIUM) 40 MG capsule, Take 40 mg by mouth., Disp: , Rfl:  .  fluticasone (FLONASE) 50 MCG/ACT nasal spray, Place into the nose., Disp: , Rfl:  .  LORazepam (ATIVAN) 1 MG tablet, Take 1 mg by mouth at bedtime as needed. , Disp: , Rfl:  .  montelukast (SINGULAIR) 10 MG tablet, Take 10 mg by mouth every morning., Disp: , Rfl:  .  SUMAtriptan (IMITREX) 25 MG tablet, Take 25 mg by mouth every 2 (two) hours as needed for migraine or headache.  May repeat in 2 hours if headache persists or recurs., Disp: , Rfl:  .  tiotropium (SPIRIVA HANDIHALER) 18 MCG inhalation capsule, Place into inhaler and inhale., Disp: , Rfl:  .  vitamin B-12 (CYANOCOBALAMIN) 1000 MCG tablet, Take 5,000 mcg by mouth daily., Disp: , Rfl:  .  LamoTRIgine 50 MG TBDP, Take 1/2 pill a day x 5 days, then 1/2 pill bid x 5 days, then 1/2 pill po tid x 5 days, then one pill po bid, Disp: 60 tablet, Rfl: 5 .  traMADol (ULTRAM) 50 MG tablet, Take 1 tablet (50 mg total) by mouth 2 (two) times daily., Disp: 60 tablet, Rfl: 3  PAST MEDICAL HISTORY: Past Medical History:  Diagnosis Date  . Arthritis    HANDS  . Chronic constipation   . COPD (chronic obstructive pulmonary disease) ([place])   . Dysuria   . Frequency of urination   . GERD (gastroesophageal reflux disease)   . Hypertension   . Migraine   . Pelvic pain in female   . Smokers' cough ([place])   . Urgency of urination   . Wears glasses     PAST SURGICAL HISTORY: Past Surgical History:  Procedure Laterality Date  . ABDOMINAL HYSTERECTOMY  1982   W/  UNILATERAL SALPINGOOPHORECTOMY  . CHOLECYSTECTOMY OPEN  1987   W/  APPENDECTOMY  . COLONOSCOPY  2002  &  12-2012  . CYSTO WITH HYDRODISTENSION N/A 02/20/2014   Procedure: [place] AND MARCAINE INSTILLATION;  Surgeon: Bernestine Amass, MD;  Location: [facility];  Service: Urology;  Laterality: N/A;  . LAPAROSCOPIC UNILATERAL SALPINGOOPHORECTOMY  1984    FAMILY HISTORY: No family history on file.  SOCIAL HISTORY:  Social History   Social History  . Marital status: Married    Spouse name: N/A  . Number of children: N/A  . Years of education: N/A   Occupational History  . Not on file.   Social History Main Topics  . Smoking status: Current Every Day Smoker    Packs/day: 0.75    Years: 48.00    Types: Cigarettes  . Smokeless tobacco: Never Used  . Alcohol use No  . Drug use: No  . Sexual activity:  Not on file   Other Topics Concern  . Not on file   Social History Narrative  . No narrative on file     PHYSICAL EXAM  Vitals:   02/18/17 1418  BP: 132/79  Pulse: 90  Resp: 16  Weight: 152 lb 8 oz (69.2 kg)  Height: 5\' 10"  (1.778 m)    Body mass index is 21.88 kg/m.   General: The patient is well-developed and well-nourished and in no acute distress  Eyes:  Funduscopic exam shows normal optic discs and retinal vessels.  Neck: The neck is supple, no carotid bruits are noted.  The  neck is nontender.  Cardiovascular: The heart has a regular rate and rhythm with a normal S1 and S2. There were no murmurs, gallops or rubs. Lungs are clear to auscultation.  Skin: Extremities are without significant edema.  Musculoskeletal/groin:  Back is nontender.   Right groin over the femoral artery is tender to deep palpation. There is no erythema.     Neurologic Exam  Mental status: The patient is alert and oriented x 3 at the time of the examination. The patient has apparent normal recent and remote memory, with an apparently normal attention span and concentration ability.   Speech is normal.  Cranial nerves: Extraocular movements are full. Pupils are equal, round, and reactive to light and accomodation.  Visual fields are full.  Facial symmetry is present. There is good facial sensation to soft touch bilaterally.Facial strength is normal.  Trapezius and sternocleidomastoid strength is normal. No dysarthria is noted.  The tongue is midline, and the patient has symmetric elevation of the soft palate. No obvious hearing deficits are noted.  Motor:  Muscle bulk is normal.   Tone is normal. Strength is  5 / 5 in all 4 extremities.   Sensory: Sensory testing is intact to pinprick, soft touch and vibration sensation in the arms.   She has a sensation in both feet, about 30% at the ankles and 20% at the toes compared to 100% sensation at the knees to vibration.  She also reported mild reduced  sensation to pain.    Coordination: Cerebellar testing reveals good finger-nose-finger and heel-to-shin bilaterally.  Gait and station: Station is normal.   Gait is normal. Tandem gait is normal. Romberg is negative.   Reflexes: Deep tendon reflexes are symmetric and normal in arms an knees and 1+ at ankles   Plantar responses are flexor.    DIAGNOSTIC DATA (LABS, IMAGING, TESTING) - I reviewed patient records, labs, notes, testing and imaging myself where available.  Lab Results  Component Value Date   HGB 14.6 02/20/2014   HCT 43.0 02/20/2014      Component Value Date/Time   NA 142 02/20/2014 1039   K 3.7 02/20/2014 1039   GLUCOSE 104 (H) 02/20/2014 1039   PROT 6.2 11/04/2016 1500       ASSESSMENT AND PLAN  Right groin pain  Numbness  Right sided sciatica  Piriformis muscle pain     1.   For pain that is likely neuropathic in the groin I will have her start lamotrigine and titrated up to 50 mg by mouth twice a day. She will continue the NSAIDs. Additionally I wrote her a prescription for tramadol to take 50 mg as needed up to twice a day.   2.   The foot numbness improved making neuropathy less likely. 3.   Trigger point injection of the right piriformis muscle with 80 mg Depo-Medrol in 3 mL Marcaine using sterile technique. She tolerated the procedure well with no complications. 4.   rtc 4-5 months or sooner if she has significant worsening of her symptoms.,       Nadelyn Enriques A. Felecia Shelling, MD, PhD 6/73/4193, 7:90 PM Certified in Neurology, Clinical Neurophysiology, Sleep Medicine, Pain Medicine and Neuroimaging  [facility] [address], [place], [place] [address] [phone]

## 2017-02-18 NOTE — Patient Instructions (Signed)
The pharmacy has the prescription for lamotrigine 50 mg tablets. For 5 days, just take one half pill a day. For the next 5 days, take one half pill twice a day. For the next 5 days, take one half pill 3 times a day Then start taking one pill twice a day from this point on.    In the future, we may increase the dose further.  If you get a rash, need to stop the medication and not take it again.

## 2017-02-24 DIAGNOSIS — M9902 Segmental and somatic dysfunction of thoracic region: Secondary | ICD-10-CM | POA: Diagnosis not present

## 2017-02-24 DIAGNOSIS — M9903 Segmental and somatic dysfunction of lumbar region: Secondary | ICD-10-CM | POA: Diagnosis not present

## 2017-02-24 DIAGNOSIS — M47814 Spondylosis without myelopathy or radiculopathy, thoracic region: Secondary | ICD-10-CM | POA: Diagnosis not present

## 2017-02-24 DIAGNOSIS — S39012A Strain of muscle, fascia and tendon of lower back, initial encounter: Secondary | ICD-10-CM | POA: Diagnosis not present

## 2017-02-24 DIAGNOSIS — M9905 Segmental and somatic dysfunction of pelvic region: Secondary | ICD-10-CM | POA: Diagnosis not present

## 2017-02-24 DIAGNOSIS — M5126 Other intervertebral disc displacement, lumbar region: Secondary | ICD-10-CM | POA: Diagnosis not present

## 2017-02-26 DIAGNOSIS — M5126 Other intervertebral disc displacement, lumbar region: Secondary | ICD-10-CM | POA: Diagnosis not present

## 2017-02-26 DIAGNOSIS — M47814 Spondylosis without myelopathy or radiculopathy, thoracic region: Secondary | ICD-10-CM | POA: Diagnosis not present

## 2017-02-26 DIAGNOSIS — S39012A Strain of muscle, fascia and tendon of lower back, initial encounter: Secondary | ICD-10-CM | POA: Diagnosis not present

## 2017-02-26 DIAGNOSIS — M9903 Segmental and somatic dysfunction of lumbar region: Secondary | ICD-10-CM | POA: Diagnosis not present

## 2017-02-26 DIAGNOSIS — M9902 Segmental and somatic dysfunction of thoracic region: Secondary | ICD-10-CM | POA: Diagnosis not present

## 2017-02-26 DIAGNOSIS — M9905 Segmental and somatic dysfunction of pelvic region: Secondary | ICD-10-CM | POA: Diagnosis not present

## 2017-03-02 DIAGNOSIS — S39012A Strain of muscle, fascia and tendon of lower back, initial encounter: Secondary | ICD-10-CM | POA: Diagnosis not present

## 2017-03-02 DIAGNOSIS — M47814 Spondylosis without myelopathy or radiculopathy, thoracic region: Secondary | ICD-10-CM | POA: Diagnosis not present

## 2017-03-02 DIAGNOSIS — M5126 Other intervertebral disc displacement, lumbar region: Secondary | ICD-10-CM | POA: Diagnosis not present

## 2017-03-02 DIAGNOSIS — M9903 Segmental and somatic dysfunction of lumbar region: Secondary | ICD-10-CM | POA: Diagnosis not present

## 2017-03-02 DIAGNOSIS — M9905 Segmental and somatic dysfunction of pelvic region: Secondary | ICD-10-CM | POA: Diagnosis not present

## 2017-03-02 DIAGNOSIS — M9902 Segmental and somatic dysfunction of thoracic region: Secondary | ICD-10-CM | POA: Diagnosis not present

## 2017-03-05 DIAGNOSIS — M9905 Segmental and somatic dysfunction of pelvic region: Secondary | ICD-10-CM | POA: Diagnosis not present

## 2017-03-05 DIAGNOSIS — M9903 Segmental and somatic dysfunction of lumbar region: Secondary | ICD-10-CM | POA: Diagnosis not present

## 2017-03-05 DIAGNOSIS — M5126 Other intervertebral disc displacement, lumbar region: Secondary | ICD-10-CM | POA: Diagnosis not present

## 2017-03-05 DIAGNOSIS — M9902 Segmental and somatic dysfunction of thoracic region: Secondary | ICD-10-CM | POA: Diagnosis not present

## 2017-03-05 DIAGNOSIS — M47814 Spondylosis without myelopathy or radiculopathy, thoracic region: Secondary | ICD-10-CM | POA: Diagnosis not present

## 2017-03-05 DIAGNOSIS — S39012A Strain of muscle, fascia and tendon of lower back, initial encounter: Secondary | ICD-10-CM | POA: Diagnosis not present

## 2017-03-10 DIAGNOSIS — M9905 Segmental and somatic dysfunction of pelvic region: Secondary | ICD-10-CM | POA: Diagnosis not present

## 2017-03-10 DIAGNOSIS — S39012A Strain of muscle, fascia and tendon of lower back, initial encounter: Secondary | ICD-10-CM | POA: Diagnosis not present

## 2017-03-10 DIAGNOSIS — M47814 Spondylosis without myelopathy or radiculopathy, thoracic region: Secondary | ICD-10-CM | POA: Diagnosis not present

## 2017-03-10 DIAGNOSIS — M5126 Other intervertebral disc displacement, lumbar region: Secondary | ICD-10-CM | POA: Diagnosis not present

## 2017-03-10 DIAGNOSIS — M9903 Segmental and somatic dysfunction of lumbar region: Secondary | ICD-10-CM | POA: Diagnosis not present

## 2017-03-10 DIAGNOSIS — M9902 Segmental and somatic dysfunction of thoracic region: Secondary | ICD-10-CM | POA: Diagnosis not present

## 2017-03-13 DIAGNOSIS — M9903 Segmental and somatic dysfunction of lumbar region: Secondary | ICD-10-CM | POA: Diagnosis not present

## 2017-03-13 DIAGNOSIS — M9902 Segmental and somatic dysfunction of thoracic region: Secondary | ICD-10-CM | POA: Diagnosis not present

## 2017-03-13 DIAGNOSIS — M5126 Other intervertebral disc displacement, lumbar region: Secondary | ICD-10-CM | POA: Diagnosis not present

## 2017-03-13 DIAGNOSIS — M9905 Segmental and somatic dysfunction of pelvic region: Secondary | ICD-10-CM | POA: Diagnosis not present

## 2017-03-13 DIAGNOSIS — M47814 Spondylosis without myelopathy or radiculopathy, thoracic region: Secondary | ICD-10-CM | POA: Diagnosis not present

## 2017-03-13 DIAGNOSIS — S39012A Strain of muscle, fascia and tendon of lower back, initial encounter: Secondary | ICD-10-CM | POA: Diagnosis not present

## 2017-03-17 DIAGNOSIS — M9903 Segmental and somatic dysfunction of lumbar region: Secondary | ICD-10-CM | POA: Diagnosis not present

## 2017-03-17 DIAGNOSIS — M9905 Segmental and somatic dysfunction of pelvic region: Secondary | ICD-10-CM | POA: Diagnosis not present

## 2017-03-17 DIAGNOSIS — M9902 Segmental and somatic dysfunction of thoracic region: Secondary | ICD-10-CM | POA: Diagnosis not present

## 2017-03-17 DIAGNOSIS — M5126 Other intervertebral disc displacement, lumbar region: Secondary | ICD-10-CM | POA: Diagnosis not present

## 2017-03-17 DIAGNOSIS — S39012A Strain of muscle, fascia and tendon of lower back, initial encounter: Secondary | ICD-10-CM | POA: Diagnosis not present

## 2017-03-17 DIAGNOSIS — M47814 Spondylosis without myelopathy or radiculopathy, thoracic region: Secondary | ICD-10-CM | POA: Diagnosis not present

## 2017-03-25 ENCOUNTER — Ambulatory Visit: Payer: PPO | Admitting: Neurology

## 2017-03-25 DIAGNOSIS — M9902 Segmental and somatic dysfunction of thoracic region: Secondary | ICD-10-CM | POA: Diagnosis not present

## 2017-03-25 DIAGNOSIS — M9903 Segmental and somatic dysfunction of lumbar region: Secondary | ICD-10-CM | POA: Diagnosis not present

## 2017-03-25 DIAGNOSIS — S39012A Strain of muscle, fascia and tendon of lower back, initial encounter: Secondary | ICD-10-CM | POA: Diagnosis not present

## 2017-03-25 DIAGNOSIS — M5126 Other intervertebral disc displacement, lumbar region: Secondary | ICD-10-CM | POA: Diagnosis not present

## 2017-03-25 DIAGNOSIS — M47814 Spondylosis without myelopathy or radiculopathy, thoracic region: Secondary | ICD-10-CM | POA: Diagnosis not present

## 2017-03-25 DIAGNOSIS — M9905 Segmental and somatic dysfunction of pelvic region: Secondary | ICD-10-CM | POA: Diagnosis not present

## 2017-03-27 DIAGNOSIS — Z23 Encounter for immunization: Secondary | ICD-10-CM | POA: Diagnosis not present

## 2017-03-27 DIAGNOSIS — R1030 Lower abdominal pain, unspecified: Secondary | ICD-10-CM | POA: Diagnosis not present

## 2017-04-01 DIAGNOSIS — H26492 Other secondary cataract, left eye: Secondary | ICD-10-CM | POA: Diagnosis not present

## 2017-04-03 DIAGNOSIS — M9902 Segmental and somatic dysfunction of thoracic region: Secondary | ICD-10-CM | POA: Diagnosis not present

## 2017-04-03 DIAGNOSIS — S39012A Strain of muscle, fascia and tendon of lower back, initial encounter: Secondary | ICD-10-CM | POA: Diagnosis not present

## 2017-04-03 DIAGNOSIS — M47814 Spondylosis without myelopathy or radiculopathy, thoracic region: Secondary | ICD-10-CM | POA: Diagnosis not present

## 2017-04-03 DIAGNOSIS — M9905 Segmental and somatic dysfunction of pelvic region: Secondary | ICD-10-CM | POA: Diagnosis not present

## 2017-04-03 DIAGNOSIS — M5126 Other intervertebral disc displacement, lumbar region: Secondary | ICD-10-CM | POA: Diagnosis not present

## 2017-04-03 DIAGNOSIS — M9903 Segmental and somatic dysfunction of lumbar region: Secondary | ICD-10-CM | POA: Diagnosis not present

## 2017-04-08 DIAGNOSIS — M9902 Segmental and somatic dysfunction of thoracic region: Secondary | ICD-10-CM | POA: Diagnosis not present

## 2017-04-08 DIAGNOSIS — M9905 Segmental and somatic dysfunction of pelvic region: Secondary | ICD-10-CM | POA: Diagnosis not present

## 2017-04-08 DIAGNOSIS — S39012A Strain of muscle, fascia and tendon of lower back, initial encounter: Secondary | ICD-10-CM | POA: Diagnosis not present

## 2017-04-08 DIAGNOSIS — M47814 Spondylosis without myelopathy or radiculopathy, thoracic region: Secondary | ICD-10-CM | POA: Diagnosis not present

## 2017-04-08 DIAGNOSIS — M5126 Other intervertebral disc displacement, lumbar region: Secondary | ICD-10-CM | POA: Diagnosis not present

## 2017-04-08 DIAGNOSIS — M9903 Segmental and somatic dysfunction of lumbar region: Secondary | ICD-10-CM | POA: Diagnosis not present

## 2017-04-15 DIAGNOSIS — S39012A Strain of muscle, fascia and tendon of lower back, initial encounter: Secondary | ICD-10-CM | POA: Diagnosis not present

## 2017-04-15 DIAGNOSIS — M9902 Segmental and somatic dysfunction of thoracic region: Secondary | ICD-10-CM | POA: Diagnosis not present

## 2017-04-15 DIAGNOSIS — M47814 Spondylosis without myelopathy or radiculopathy, thoracic region: Secondary | ICD-10-CM | POA: Diagnosis not present

## 2017-04-15 DIAGNOSIS — M5126 Other intervertebral disc displacement, lumbar region: Secondary | ICD-10-CM | POA: Diagnosis not present

## 2017-04-15 DIAGNOSIS — M9903 Segmental and somatic dysfunction of lumbar region: Secondary | ICD-10-CM | POA: Diagnosis not present

## 2017-04-15 DIAGNOSIS — M9905 Segmental and somatic dysfunction of pelvic region: Secondary | ICD-10-CM | POA: Diagnosis not present

## 2017-04-22 DIAGNOSIS — M9905 Segmental and somatic dysfunction of pelvic region: Secondary | ICD-10-CM | POA: Diagnosis not present

## 2017-04-22 DIAGNOSIS — M9902 Segmental and somatic dysfunction of thoracic region: Secondary | ICD-10-CM | POA: Diagnosis not present

## 2017-04-22 DIAGNOSIS — M9903 Segmental and somatic dysfunction of lumbar region: Secondary | ICD-10-CM | POA: Diagnosis not present

## 2017-04-22 DIAGNOSIS — M5126 Other intervertebral disc displacement, lumbar region: Secondary | ICD-10-CM | POA: Diagnosis not present

## 2017-04-22 DIAGNOSIS — M47814 Spondylosis without myelopathy or radiculopathy, thoracic region: Secondary | ICD-10-CM | POA: Diagnosis not present

## 2017-04-22 DIAGNOSIS — S39012A Strain of muscle, fascia and tendon of lower back, initial encounter: Secondary | ICD-10-CM | POA: Diagnosis not present

## 2017-05-01 ENCOUNTER — Other Ambulatory Visit: Payer: Self-pay | Admitting: Family Medicine

## 2017-05-01 DIAGNOSIS — E2839 Other primary ovarian failure: Secondary | ICD-10-CM

## 2017-05-01 DIAGNOSIS — Z1231 Encounter for screening mammogram for malignant neoplasm of breast: Secondary | ICD-10-CM

## 2017-05-08 ENCOUNTER — Ambulatory Visit
Admission: RE | Admit: 2017-05-08 | Discharge: 2017-05-08 | Disposition: A | Discharge status: Home / Self Care | Payer: PPO | Source: Ambulatory Visit | Attending: Family Medicine | Admitting: Family Medicine

## 2017-05-08 DIAGNOSIS — Z1231 Encounter for screening mammogram for malignant neoplasm of breast: Secondary | ICD-10-CM

## 2017-05-12 DIAGNOSIS — J0100 Acute maxillary sinusitis, unspecified: Secondary | ICD-10-CM | POA: Diagnosis not present

## 2017-05-13 DIAGNOSIS — M9902 Segmental and somatic dysfunction of thoracic region: Secondary | ICD-10-CM | POA: Diagnosis not present

## 2017-05-13 DIAGNOSIS — M47814 Spondylosis without myelopathy or radiculopathy, thoracic region: Secondary | ICD-10-CM | POA: Diagnosis not present

## 2017-05-13 DIAGNOSIS — M5126 Other intervertebral disc displacement, lumbar region: Secondary | ICD-10-CM | POA: Diagnosis not present

## 2017-05-13 DIAGNOSIS — M9905 Segmental and somatic dysfunction of pelvic region: Secondary | ICD-10-CM | POA: Diagnosis not present

## 2017-05-13 DIAGNOSIS — S39012A Strain of muscle, fascia and tendon of lower back, initial encounter: Secondary | ICD-10-CM | POA: Diagnosis not present

## 2017-05-13 DIAGNOSIS — M9903 Segmental and somatic dysfunction of lumbar region: Secondary | ICD-10-CM | POA: Diagnosis not present

## 2017-05-15 DIAGNOSIS — M899 Disorder of bone, unspecified: Secondary | ICD-10-CM | POA: Diagnosis not present

## 2017-05-15 DIAGNOSIS — F419 Anxiety disorder, unspecified: Secondary | ICD-10-CM | POA: Diagnosis not present

## 2017-05-15 DIAGNOSIS — J449 Chronic obstructive pulmonary disease, unspecified: Secondary | ICD-10-CM | POA: Diagnosis not present

## 2017-05-15 DIAGNOSIS — F322 Major depressive disorder, single episode, severe without psychotic features: Secondary | ICD-10-CM | POA: Diagnosis not present

## 2017-05-27 DIAGNOSIS — G8929 Other chronic pain: Secondary | ICD-10-CM | POA: Diagnosis not present

## 2017-05-27 DIAGNOSIS — Z720 Tobacco use: Secondary | ICD-10-CM | POA: Diagnosis not present

## 2017-05-27 DIAGNOSIS — R1031 Right lower quadrant pain: Secondary | ICD-10-CM | POA: Diagnosis not present

## 2017-06-01 ENCOUNTER — Other Ambulatory Visit: Payer: Self-pay | Admitting: Surgery

## 2017-06-01 DIAGNOSIS — M47814 Spondylosis without myelopathy or radiculopathy, thoracic region: Secondary | ICD-10-CM | POA: Diagnosis not present

## 2017-06-01 DIAGNOSIS — M9905 Segmental and somatic dysfunction of pelvic region: Secondary | ICD-10-CM | POA: Diagnosis not present

## 2017-06-01 DIAGNOSIS — S39012A Strain of muscle, fascia and tendon of lower back, initial encounter: Secondary | ICD-10-CM | POA: Diagnosis not present

## 2017-06-01 DIAGNOSIS — M9903 Segmental and somatic dysfunction of lumbar region: Secondary | ICD-10-CM | POA: Diagnosis not present

## 2017-06-01 DIAGNOSIS — M5126 Other intervertebral disc displacement, lumbar region: Secondary | ICD-10-CM | POA: Diagnosis not present

## 2017-06-01 DIAGNOSIS — R1031 Right lower quadrant pain: Principal | ICD-10-CM

## 2017-06-01 DIAGNOSIS — M9902 Segmental and somatic dysfunction of thoracic region: Secondary | ICD-10-CM | POA: Diagnosis not present

## 2017-06-01 DIAGNOSIS — G8929 Other chronic pain: Secondary | ICD-10-CM

## 2017-06-02 ENCOUNTER — Ambulatory Visit
Admission: RE | Admit: 2017-06-02 | Discharge: 2017-06-02 | Disposition: A | Discharge status: Home / Self Care | Payer: PPO | Source: Ambulatory Visit | Attending: Surgery | Admitting: Surgery

## 2017-06-02 DIAGNOSIS — G8929 Other chronic pain: Secondary | ICD-10-CM

## 2017-06-02 DIAGNOSIS — R1030 Lower abdominal pain, unspecified: Secondary | ICD-10-CM | POA: Diagnosis not present

## 2017-06-02 DIAGNOSIS — R1031 Right lower quadrant pain: Principal | ICD-10-CM

## 2017-06-09 ENCOUNTER — Other Ambulatory Visit: Payer: Self-pay | Admitting: Surgery

## 2017-06-09 DIAGNOSIS — G8929 Other chronic pain: Secondary | ICD-10-CM

## 2017-06-09 DIAGNOSIS — R1031 Right lower quadrant pain: Principal | ICD-10-CM

## 2017-06-12 ENCOUNTER — Ambulatory Visit
Admission: RE | Admit: 2017-06-12 | Discharge: 2017-06-12 | Disposition: A | Discharge status: Home / Self Care | Payer: PPO | Source: Ambulatory Visit | Attending: Surgery | Admitting: Surgery

## 2017-06-12 DIAGNOSIS — R1031 Right lower quadrant pain: Principal | ICD-10-CM

## 2017-06-12 DIAGNOSIS — K5730 Diverticulosis of large intestine without perforation or abscess without bleeding: Secondary | ICD-10-CM | POA: Diagnosis not present

## 2017-06-12 DIAGNOSIS — G8929 Other chronic pain: Secondary | ICD-10-CM

## 2017-06-12 MED ORDER — IOPAMIDOL (ISOVUE-300) INJECTION 61%
100.0000 mL | Freq: Once | INTRAVENOUS | Status: AC | PRN
  Administered 2017-06-12: 100 mL via INTRAVENOUS

## 2017-06-19 DIAGNOSIS — M9902 Segmental and somatic dysfunction of thoracic region: Secondary | ICD-10-CM | POA: Diagnosis not present

## 2017-06-19 DIAGNOSIS — M9905 Segmental and somatic dysfunction of pelvic region: Secondary | ICD-10-CM | POA: Diagnosis not present

## 2017-06-19 DIAGNOSIS — M47814 Spondylosis without myelopathy or radiculopathy, thoracic region: Secondary | ICD-10-CM | POA: Diagnosis not present

## 2017-06-19 DIAGNOSIS — S39012A Strain of muscle, fascia and tendon of lower back, initial encounter: Secondary | ICD-10-CM | POA: Diagnosis not present

## 2017-06-19 DIAGNOSIS — M5126 Other intervertebral disc displacement, lumbar region: Secondary | ICD-10-CM | POA: Diagnosis not present

## 2017-06-19 DIAGNOSIS — M9903 Segmental and somatic dysfunction of lumbar region: Secondary | ICD-10-CM | POA: Diagnosis not present

## 2017-07-06 DIAGNOSIS — R1032 Left lower quadrant pain: Secondary | ICD-10-CM | POA: Diagnosis not present

## 2017-07-06 DIAGNOSIS — K5901 Slow transit constipation: Secondary | ICD-10-CM | POA: Diagnosis not present

## 2017-07-06 DIAGNOSIS — R143 Flatulence: Secondary | ICD-10-CM | POA: Diagnosis not present

## 2017-07-08 DIAGNOSIS — M47814 Spondylosis without myelopathy or radiculopathy, thoracic region: Secondary | ICD-10-CM | POA: Diagnosis not present

## 2017-07-08 DIAGNOSIS — S39012A Strain of muscle, fascia and tendon of lower back, initial encounter: Secondary | ICD-10-CM | POA: Diagnosis not present

## 2017-07-08 DIAGNOSIS — M9902 Segmental and somatic dysfunction of thoracic region: Secondary | ICD-10-CM | POA: Diagnosis not present

## 2017-07-08 DIAGNOSIS — M9903 Segmental and somatic dysfunction of lumbar region: Secondary | ICD-10-CM | POA: Diagnosis not present

## 2017-07-08 DIAGNOSIS — M9905 Segmental and somatic dysfunction of pelvic region: Secondary | ICD-10-CM | POA: Diagnosis not present

## 2017-07-08 DIAGNOSIS — M5126 Other intervertebral disc displacement, lumbar region: Secondary | ICD-10-CM | POA: Diagnosis not present

## 2017-07-20 ENCOUNTER — Other Ambulatory Visit: Payer: PPO

## 2017-07-20 ENCOUNTER — Inpatient Hospital Stay: Admission: RE | Admit: 2017-07-20 | Payer: PPO | Source: Ambulatory Visit

## 2017-07-21 ENCOUNTER — Ambulatory Visit: Payer: PPO | Admitting: Neurology

## 2017-07-31 DIAGNOSIS — M9902 Segmental and somatic dysfunction of thoracic region: Secondary | ICD-10-CM | POA: Diagnosis not present

## 2017-07-31 DIAGNOSIS — M5126 Other intervertebral disc displacement, lumbar region: Secondary | ICD-10-CM | POA: Diagnosis not present

## 2017-07-31 DIAGNOSIS — S39012A Strain of muscle, fascia and tendon of lower back, initial encounter: Secondary | ICD-10-CM | POA: Diagnosis not present

## 2017-07-31 DIAGNOSIS — M9905 Segmental and somatic dysfunction of pelvic region: Secondary | ICD-10-CM | POA: Diagnosis not present

## 2017-07-31 DIAGNOSIS — M47814 Spondylosis without myelopathy or radiculopathy, thoracic region: Secondary | ICD-10-CM | POA: Diagnosis not present

## 2017-07-31 DIAGNOSIS — M9903 Segmental and somatic dysfunction of lumbar region: Secondary | ICD-10-CM | POA: Diagnosis not present

## 2017-08-04 DIAGNOSIS — M8580 Other specified disorders of bone density and structure, unspecified site: Secondary | ICD-10-CM

## 2017-08-04 HISTORY — DX: Other specified disorders of bone density and structure, unspecified site: M85.80

## 2017-08-06 ENCOUNTER — Ambulatory Visit: Payer: Self-pay | Admitting: Surgery

## 2017-08-18 ENCOUNTER — Ambulatory Visit
Admission: RE | Admit: 2017-08-18 | Discharge: 2017-08-18 | Disposition: A | Discharge status: Home / Self Care | Payer: PPO | Source: Ambulatory Visit | Attending: Family Medicine | Admitting: Family Medicine

## 2017-08-18 DIAGNOSIS — M810 Age-related osteoporosis without current pathological fracture: Secondary | ICD-10-CM | POA: Diagnosis not present

## 2017-08-18 DIAGNOSIS — Z780 Asymptomatic menopausal state: Secondary | ICD-10-CM | POA: Diagnosis not present

## 2017-08-18 DIAGNOSIS — E2839 Other primary ovarian failure: Secondary | ICD-10-CM

## 2017-08-21 DIAGNOSIS — M9902 Segmental and somatic dysfunction of thoracic region: Secondary | ICD-10-CM | POA: Diagnosis not present

## 2017-08-21 DIAGNOSIS — M5126 Other intervertebral disc displacement, lumbar region: Secondary | ICD-10-CM | POA: Diagnosis not present

## 2017-08-21 DIAGNOSIS — M9905 Segmental and somatic dysfunction of pelvic region: Secondary | ICD-10-CM | POA: Diagnosis not present

## 2017-08-21 DIAGNOSIS — M47814 Spondylosis without myelopathy or radiculopathy, thoracic region: Secondary | ICD-10-CM | POA: Diagnosis not present

## 2017-08-21 DIAGNOSIS — S39012A Strain of muscle, fascia and tendon of lower back, initial encounter: Secondary | ICD-10-CM | POA: Diagnosis not present

## 2017-08-21 DIAGNOSIS — M9903 Segmental and somatic dysfunction of lumbar region: Secondary | ICD-10-CM | POA: Diagnosis not present

## 2017-08-25 DIAGNOSIS — J029 Acute pharyngitis, unspecified: Secondary | ICD-10-CM | POA: Diagnosis not present

## 2017-08-25 DIAGNOSIS — R0989 Other specified symptoms and signs involving the circulatory and respiratory systems: Secondary | ICD-10-CM | POA: Diagnosis not present

## 2017-09-03 DIAGNOSIS — G43109 Migraine with aura, not intractable, without status migrainosus: Secondary | ICD-10-CM | POA: Diagnosis not present

## 2017-09-03 DIAGNOSIS — Z0000 Encounter for general adult medical examination without abnormal findings: Secondary | ICD-10-CM | POA: Diagnosis not present

## 2017-09-03 DIAGNOSIS — G479 Sleep disorder, unspecified: Secondary | ICD-10-CM | POA: Diagnosis not present

## 2017-09-03 DIAGNOSIS — E559 Vitamin D deficiency, unspecified: Secondary | ICD-10-CM | POA: Diagnosis not present

## 2017-09-03 DIAGNOSIS — E538 Deficiency of other specified B group vitamins: Secondary | ICD-10-CM | POA: Diagnosis not present

## 2017-09-03 DIAGNOSIS — J029 Acute pharyngitis, unspecified: Secondary | ICD-10-CM | POA: Diagnosis not present

## 2017-09-03 DIAGNOSIS — Z79899 Other long term (current) drug therapy: Secondary | ICD-10-CM | POA: Diagnosis not present

## 2017-09-03 DIAGNOSIS — F17210 Nicotine dependence, cigarettes, uncomplicated: Secondary | ICD-10-CM | POA: Diagnosis not present

## 2017-09-03 DIAGNOSIS — F322 Major depressive disorder, single episode, severe without psychotic features: Secondary | ICD-10-CM | POA: Diagnosis not present

## 2017-09-03 DIAGNOSIS — E785 Hyperlipidemia, unspecified: Secondary | ICD-10-CM | POA: Diagnosis not present

## 2017-09-03 DIAGNOSIS — D692 Other nonthrombocytopenic purpura: Secondary | ICD-10-CM | POA: Diagnosis not present

## 2017-09-14 DIAGNOSIS — M5126 Other intervertebral disc displacement, lumbar region: Secondary | ICD-10-CM | POA: Diagnosis not present

## 2017-09-14 DIAGNOSIS — M47814 Spondylosis without myelopathy or radiculopathy, thoracic region: Secondary | ICD-10-CM | POA: Diagnosis not present

## 2017-09-14 DIAGNOSIS — M9903 Segmental and somatic dysfunction of lumbar region: Secondary | ICD-10-CM | POA: Diagnosis not present

## 2017-09-14 DIAGNOSIS — S39012A Strain of muscle, fascia and tendon of lower back, initial encounter: Secondary | ICD-10-CM | POA: Diagnosis not present

## 2017-09-14 DIAGNOSIS — M9905 Segmental and somatic dysfunction of pelvic region: Secondary | ICD-10-CM | POA: Diagnosis not present

## 2017-09-14 DIAGNOSIS — M9902 Segmental and somatic dysfunction of thoracic region: Secondary | ICD-10-CM | POA: Diagnosis not present

## 2017-09-15 ENCOUNTER — Other Ambulatory Visit: Payer: Self-pay | Admitting: Family Medicine

## 2017-09-15 DIAGNOSIS — F17219 Nicotine dependence, cigarettes, with unspecified nicotine-induced disorders: Secondary | ICD-10-CM

## 2017-10-05 ENCOUNTER — Other Ambulatory Visit: Payer: PPO

## 2017-10-09 DIAGNOSIS — R05 Cough: Secondary | ICD-10-CM | POA: Diagnosis not present

## 2017-10-09 DIAGNOSIS — J209 Acute bronchitis, unspecified: Secondary | ICD-10-CM | POA: Diagnosis not present

## 2017-10-09 DIAGNOSIS — J441 Chronic obstructive pulmonary disease with (acute) exacerbation: Secondary | ICD-10-CM | POA: Diagnosis not present

## 2017-10-28 DIAGNOSIS — M9902 Segmental and somatic dysfunction of thoracic region: Secondary | ICD-10-CM | POA: Diagnosis not present

## 2017-10-28 DIAGNOSIS — M47814 Spondylosis without myelopathy or radiculopathy, thoracic region: Secondary | ICD-10-CM | POA: Diagnosis not present

## 2017-10-28 DIAGNOSIS — M9905 Segmental and somatic dysfunction of pelvic region: Secondary | ICD-10-CM | POA: Diagnosis not present

## 2017-10-28 DIAGNOSIS — S39012A Strain of muscle, fascia and tendon of lower back, initial encounter: Secondary | ICD-10-CM | POA: Diagnosis not present

## 2017-10-28 DIAGNOSIS — M9903 Segmental and somatic dysfunction of lumbar region: Secondary | ICD-10-CM | POA: Diagnosis not present

## 2017-10-28 DIAGNOSIS — M5126 Other intervertebral disc displacement, lumbar region: Secondary | ICD-10-CM | POA: Diagnosis not present

## 2017-10-29 ENCOUNTER — Ambulatory Visit
Admission: RE | Admit: 2017-10-29 | Discharge: 2017-10-29 | Disposition: A | Discharge status: Home / Self Care | Payer: PPO | Source: Ambulatory Visit | Attending: Family Medicine | Admitting: Family Medicine

## 2017-10-29 DIAGNOSIS — F17219 Nicotine dependence, cigarettes, with unspecified nicotine-induced disorders: Secondary | ICD-10-CM

## 2017-10-29 DIAGNOSIS — R918 Other nonspecific abnormal finding of lung field: Secondary | ICD-10-CM | POA: Diagnosis not present

## 2017-11-05 ENCOUNTER — Other Ambulatory Visit: Payer: Self-pay | Admitting: Family Medicine

## 2017-11-05 DIAGNOSIS — D492 Neoplasm of unspecified behavior of bone, soft tissue, and skin: Secondary | ICD-10-CM

## 2017-11-10 ENCOUNTER — Other Ambulatory Visit: Payer: Self-pay | Admitting: Family Medicine

## 2017-11-10 DIAGNOSIS — D492 Neoplasm of unspecified behavior of bone, soft tissue, and skin: Secondary | ICD-10-CM

## 2017-11-13 DIAGNOSIS — S39012A Strain of muscle, fascia and tendon of lower back, initial encounter: Secondary | ICD-10-CM | POA: Diagnosis not present

## 2017-11-13 DIAGNOSIS — M47814 Spondylosis without myelopathy or radiculopathy, thoracic region: Secondary | ICD-10-CM | POA: Diagnosis not present

## 2017-11-13 DIAGNOSIS — M9905 Segmental and somatic dysfunction of pelvic region: Secondary | ICD-10-CM | POA: Diagnosis not present

## 2017-11-13 DIAGNOSIS — M5126 Other intervertebral disc displacement, lumbar region: Secondary | ICD-10-CM | POA: Diagnosis not present

## 2017-11-13 DIAGNOSIS — M9903 Segmental and somatic dysfunction of lumbar region: Secondary | ICD-10-CM | POA: Diagnosis not present

## 2017-11-13 DIAGNOSIS — M9902 Segmental and somatic dysfunction of thoracic region: Secondary | ICD-10-CM | POA: Diagnosis not present

## 2017-11-14 ENCOUNTER — Other Ambulatory Visit: Payer: PPO

## 2017-11-14 ENCOUNTER — Ambulatory Visit
Admission: RE | Admit: 2017-11-14 | Discharge: 2017-11-14 | Disposition: A | Discharge status: Home / Self Care | Payer: PPO | Source: Ambulatory Visit | Attending: Family Medicine | Admitting: Family Medicine

## 2017-11-14 DIAGNOSIS — M5124 Other intervertebral disc displacement, thoracic region: Secondary | ICD-10-CM | POA: Diagnosis not present

## 2017-11-14 DIAGNOSIS — D492 Neoplasm of unspecified behavior of bone, soft tissue, and skin: Secondary | ICD-10-CM

## 2017-11-14 DIAGNOSIS — M48061 Spinal stenosis, lumbar region without neurogenic claudication: Secondary | ICD-10-CM | POA: Diagnosis not present

## 2017-11-14 MED ORDER — GADOBENATE DIMEGLUMINE 529 MG/ML IV SOLN
14.0000 mL | Freq: Once | INTRAVENOUS | Status: AC | PRN
  Administered 2017-11-14: 14 mL via INTRAVENOUS

## 2017-11-18 ENCOUNTER — Other Ambulatory Visit: Payer: PPO

## 2017-11-30 ENCOUNTER — Other Ambulatory Visit: Payer: Self-pay | Admitting: Family Medicine

## 2017-11-30 DIAGNOSIS — N289 Disorder of kidney and ureter, unspecified: Secondary | ICD-10-CM

## 2017-12-02 DIAGNOSIS — M9902 Segmental and somatic dysfunction of thoracic region: Secondary | ICD-10-CM | POA: Diagnosis not present

## 2017-12-02 DIAGNOSIS — M4804 Spinal stenosis, thoracic region: Secondary | ICD-10-CM | POA: Diagnosis not present

## 2017-12-02 DIAGNOSIS — M9905 Segmental and somatic dysfunction of pelvic region: Secondary | ICD-10-CM | POA: Diagnosis not present

## 2017-12-02 DIAGNOSIS — M5126 Other intervertebral disc displacement, lumbar region: Secondary | ICD-10-CM | POA: Diagnosis not present

## 2017-12-02 DIAGNOSIS — M47814 Spondylosis without myelopathy or radiculopathy, thoracic region: Secondary | ICD-10-CM | POA: Diagnosis not present

## 2017-12-02 DIAGNOSIS — M9903 Segmental and somatic dysfunction of lumbar region: Secondary | ICD-10-CM | POA: Diagnosis not present

## 2017-12-02 DIAGNOSIS — S39012A Strain of muscle, fascia and tendon of lower back, initial encounter: Secondary | ICD-10-CM | POA: Diagnosis not present

## 2017-12-04 ENCOUNTER — Ambulatory Visit
Admission: RE | Admit: 2017-12-04 | Discharge: 2017-12-04 | Disposition: A | Discharge status: Home / Self Care | Payer: PPO | Source: Ambulatory Visit | Attending: Family Medicine | Admitting: Family Medicine

## 2017-12-04 DIAGNOSIS — N289 Disorder of kidney and ureter, unspecified: Secondary | ICD-10-CM | POA: Diagnosis not present

## 2017-12-07 ENCOUNTER — Ambulatory Visit: Payer: Self-pay | Admitting: Surgery

## 2017-12-07 DIAGNOSIS — G8929 Other chronic pain: Secondary | ICD-10-CM | POA: Diagnosis not present

## 2017-12-07 DIAGNOSIS — R1031 Right lower quadrant pain: Secondary | ICD-10-CM | POA: Diagnosis not present

## 2017-12-07 DIAGNOSIS — K4090 Unilateral inguinal hernia, without obstruction or gangrene, not specified as recurrent: Secondary | ICD-10-CM | POA: Diagnosis not present

## 2017-12-07 DIAGNOSIS — Z01818 Encounter for other preprocedural examination: Secondary | ICD-10-CM | POA: Diagnosis not present

## 2017-12-07 NOTE — H&P (Signed)
Catherine [name] Documented: 12/07/2017 11:49 AM Location: [facility] Patient #: [id number] DOB: 1946/05/14 Married / Language: Catherine [name] / Race: White Female  History of Present Illness Catherine Hector MD; 12/07/2017 1:12 PM) The patient is a 72 year old female who presents with inguinal pain. Note for "Inguinal pain": ` ` `  The patient is a 72 year old female who presents with inguinal pain.  Please see history below. Patient returns. CT scan done several months ago with suspicious for inguinal or possible femoral hernia. Persistent small cystic lesion such as a lymph node versus recurrent lymphocele. I offered surgical repair. She was interested in it. I postdilated January. She comes in 3 months later. She is now ready to consider surgery. She has quit smoking! "Don't I get any credit for that ?!" She's been having some back and other issues. In seeing other specialists including workup for MRI of the spine. Some paraspinal masses looked benign and underwhelming. Concern of a left kidney mass seen on a noncontrasted CT but not on prior contrasted CT. Ultrasound notice its persistence. MRI recommended. She is waiting to decide on that. She does note that the right groin does still bother her. Worse after a coughing episode that convinced her to finally quit smoking. She has been trying to do some exercises and stretching that it is too uncomfortable to persist. She is hoping to get the surgery done to address the hernia and possible seal so she can focus on exercising & TaiChi   Allergies Catherine [name], [name]; 12/07/2017 11:49 AM) Penicillin G Benzathine & Proc *PENICILLINS* Rash Sulfabenzamide *CHEMICALS* rash Allergies Reconciled  Medication History (Catherine [name], [name]; 12/07/2017 11:50 AM) Calcium Carbonate (Oral) Specific strength unknown - Active. Ibuprofen (Oral) Specific strength unknown - Active. TraMADol HCl (50MG  Tablet, Oral as needed)  Active. LORazepam (1MG  Tablet, Oral) Active. DiltiaZEM HCl ER Beads (360MG  Capsule ER 24HR, Oral) Active. Symbicort (160-4.5MCG/ACT Aerosol, Inhalation) Active. Albuterol Sulfate ((2.5 MG/3ML)0.083% Nebulized Soln, Inhalation) Active. Sucralfate (1GM Tablet, Oral) Active. NexIUM (40MG  Capsule DR, Oral) Active. Vitamin D (Cholecalciferol) (1000UNIT Capsule, Oral) Active. Vitamin B-12 (5000MCG Tablet Disint, Oral) Active. Cetirizine HCl (5MG  Tablet, Oral) Active. Flonase (50MCG/ACT Suspension, Nasal) Active. Medications Reconciled    Vitals (Catherine [name]; 12/07/2017 11:50 AM) 12/07/2017 11:50 AM Weight: 157.38 lb Height: 70in Body Surface Area: 1.89 m Body Mass Index: 22.58 kg/m  Temp.: 99.9F(Oral)  Pulse: 106 (Regular)  BP: 130/82 (Sitting, Right Arm, Standard)      Physical Exam Catherine Hector MD; 12/07/2017 1:12 PM)  General Mental Status-Alert. General Appearance-Not in acute distress. Voice-Normal. Note: Restless. Frustrated. Not tearful. Interrupts often  Integumentary Global Assessment Normal Exam - Distribution of scalp and body hair is normal. General Characteristics Overall Skin Surface - no rashes and no suspicious lesions.  Head and Neck Head-normocephalic, atraumatic with no lesions or palpable masses. Face Global Assessment - atraumatic, no absence of expression. Neck Global Assessment - no abnormal movements, no decreased range of motion. Trachea-midline. Thyroid Gland Characteristics - non-tender.  Eye Eyeball - Left-Extraocular movements intact, No Nystagmus. Eyeball - Right-Extraocular movements intact, No Nystagmus. Upper Eyelid - Left-No Cyanotic. Upper Eyelid - Right-No Cyanotic. Note: Wears glasses. Vision corrected  Chest and Lung Exam Inspection Accessory muscles - No use of accessory muscles in breathing.  Female Genitourinary Note: Right groin incision with normal healing  ridge. Areas soft and flat. No lump at old drain site. Mildly enlarged right groin lymph nodes but certainly no recurrent seroma or lymphangioma.  Subtle impulse on right consistent with small inguinal hernia. No major guarding. Nothing obvious on the left side.  Peripheral Vascular Upper Extremity Inspection - Left - Not Gangrenous, No Petechiae. Right - Not Gangrenous, No Petechiae.  Neurologic Neurologic evaluation reveals -normal attention span and ability to concentrate, able to name objects and repeat phrases. Appropriate fund of knowledge and normal coordination.  Neuropsychiatric Mental status exam performed with findings of-able to articulate well with normal speech/language, rate, volume and coherence and no evidence of hallucinations, delusions, obsessions or homicidal/suicidal ideation. Orientation-oriented X3.  Musculoskeletal Global Assessment Gait and Station - normal gait and station. Note: She was concerned about some swelling in her right lower extremity. No definite clinically significant anemia to my mind.  Lymphatic General Lymphatics Description - No Generalized lymphadenopathy.    Assessment & Plan Catherine Hector MD; 12/07/2017 1:14 PM)  RIGHT INGUINAL HERNIA (K40.90) Impression: History and physical and CT scan suspicious for right inguinal hernia and woman who had had a lymphocele excised by me. Small little lymph node versus cyst nearby as well.  She is ready to consider surgery now. Laparoscopic preperitoneal exploration. Repair of any hernias found. If there is a cystic mass or lymph node on the iliacs would remove that as well. This allowed to rule out femoral hernia or contralateral hernia. I would consider doing a triple neurectomy as well given her chronic nerve pain & other issues.  She quit smoking. I am glad that that has happened.  She is getting workup for a left kidney mass. Hopefully benign. CT scan and an ultrasound done. Recommend  MRI. I strongly recommend she complete the workup to rule out a kidney tumor or at least reassured that this is mass. I did explain that MRIs of kidney masses are sometimes needed unequivocal lesions. She is frustrated with getting another MRI, but most likely will go ahead and proceed. There are a few cuts of the kidney on her MRI spine films, but I don't know if they can really see that lesion and usually radiology tries before recommending a separate MRI   GROIN PAIN, CHRONIC, RIGHT (R10.31) Impression: Resolution of groin pain. Still very anxious about the clips. Explained to her that this commonly used for lymph node dissections. She is worried about them causing injury if she needed an MRI. I noted that is not likely. They are well incorporated. She wanted to show the pronounced of the pelvic films and wondered if one and removed. I explained to her that that was highly unlikely. I offered to go over the CAT scan with her in person and review the pictures to show the positions of the clips. She declined since she discussed with her hips surgeon, Dr. Rhona Raider.  I do worry that she could have a legitimate femoral hernia. Perhaps some inguinal laxity as well. Her pain seems to be very positional activity related. The challenge in her as I think there is component of depression and anxiety that [place] ability to cope with all this. Her tobacco addiction also makes pain more likely as well. However, if she does have a true hernia, it will only worsen.  I recommended an ultrasound of the region to make sure she has no evidence of any vascular or lymphatic abnormalities. I doubt a pseudoaneurysm. I really don't feel any new groin mass. Perhaps they can reproduce a possible hernia.  Should that be negative for any arterial/lymphatic/venous problem and/or show a hernia, offer laparoscopic preperitoneal exploration with hernia repair. Perhaps  treated like a sports repair. Look on the other left lateral  side to make sure that she does not have a contralateral hernia. Try to leave it alone since she is not having any left groin symptoms. Yet. I cautioned her that her pain may worsen with another operation down there. However, her story and exam are more suspicious for a new hernia problem and surgery gives a good chance to help resolve a hernia problem. I worry with her risk factors that she will struggle with pain and coping no matter what is done. The fact that her old groin pain eventually went away in a year does give me guarded hope t  Current Plans Pt Education - [facility] Pelvic Floor Exercises (Kegels) and Dysfunction HCI (Soyla Bainter)  PREOP - ING HERNIA - ENCOUNTER FOR PREOPERATIVE EXAMINATION FOR GENERAL SURGICAL PROCEDURE (Z01.818)  Current Plans You are being scheduled for surgery- Our schedulers will call you.  You should hear from our office's scheduling department within 5 working days about the location, date, and time of surgery. We try to make accommodations for patient's preferences in scheduling surgery, but sometimes the OR schedule or the surgeon's schedule prevents [place] from making those accommodations.  If you have not heard from our office [phone]) in 5 working days, call the office and ask for your surgeon's nurse.  If you have other questions about your diagnosis, plan, or surgery, call the office and ask for your surgeon's nurse.  Written instructions provided The anatomy & physiology of the abdominal wall and pelvic floor was discussed. The pathophysiology of hernias in the inguinal and pelvic region was discussed. Natural history risks such as progressive enlargement, pain, incarceration, and strangulation was discussed. Contributors to complications such as smoking, obesity, diabetes, prior surgery, etc were discussed.  I feel the risks of no intervention will lead to serious problems that outweigh the operative risks; therefore, I recommended surgery to reduce and  repair the hernia. I explained laparoscopic techniques with possible need for an open approach. I noted usual use of mesh to patch and/or buttress hernia repair  Risks such as bleeding, infection, abscess, need for further treatment, heart attack, death, and other risks were discussed. I noted a good likelihood this will help address the problem. Goals of post-operative recovery were discussed as well. Possibility that this will not correct all symptoms was explained. I stressed the importance of low-impact activity, aggressive pain control, avoiding constipation, & not pushing through pain to minimize risk of post-operative chronic pain or injury. Possibility of reherniation was discussed. We will work to minimize complications.  An educational handout further explaining the pathology & treatment options was given as well. Questions were answered. The patient expresses understanding & wishes to proceed with surgery.  Pt Education - Pamphlet Given - Laparoscopic Hernia Repair: discussed with patient and provided information. Pt Education - [facility] Pain Control (Jacon Whetzel) Pt Education - [facility] Hernia Post-Op HCI (Willine Schwalbe): discussed with patient and provided information. Pt Education - [facility] Mesh education: discussed with patient and provided information.  Catherine [name], M.D., F.A.C.S. Gastrointestinal and Minimally Invasive Surgery [facility] [address] [place], [place] [id number] [phone] Main / Paging

## 2017-12-09 ENCOUNTER — Other Ambulatory Visit: Payer: Self-pay | Admitting: Family Medicine

## 2017-12-09 DIAGNOSIS — N289 Disorder of kidney and ureter, unspecified: Secondary | ICD-10-CM

## 2017-12-14 DIAGNOSIS — H26491 Other secondary cataract, right eye: Secondary | ICD-10-CM | POA: Diagnosis not present

## 2017-12-15 ENCOUNTER — Encounter: Payer: Self-pay | Admitting: *Deleted

## 2017-12-15 DIAGNOSIS — M5136 Other intervertebral disc degeneration, lumbar region: Secondary | ICD-10-CM

## 2017-12-15 DIAGNOSIS — M51369 Other intervertebral disc degeneration, lumbar region without mention of lumbar back pain or lower extremity pain: Secondary | ICD-10-CM | POA: Insufficient documentation

## 2017-12-15 DIAGNOSIS — R3915 Urgency of urination: Secondary | ICD-10-CM | POA: Insufficient documentation

## 2017-12-15 DIAGNOSIS — F419 Anxiety disorder, unspecified: Secondary | ICD-10-CM

## 2017-12-15 DIAGNOSIS — M8580 Other specified disorders of bone density and structure, unspecified site: Secondary | ICD-10-CM

## 2017-12-16 ENCOUNTER — Encounter: Payer: Self-pay | Admitting: Surgery

## 2017-12-16 ENCOUNTER — Other Ambulatory Visit: Payer: Self-pay

## 2017-12-16 ENCOUNTER — Institutional Professional Consult (permissible substitution): Payer: PPO | Admitting: Surgery

## 2017-12-16 VITALS — BP 127/83 | HR 84 | Resp 16 | Ht 70.0 in | Wt 157.0 lb

## 2017-12-16 DIAGNOSIS — I712 Thoracic aortic aneurysm, without rupture, unspecified: Secondary | ICD-10-CM

## 2017-12-16 NOTE — Progress Notes (Signed)
Cardiothoracic Surgery Consultation   PCP is Jonathon Jordan, MD Referring Provider is Jonathon Jordan, MD  Chief Complaint  Patient presents with  . Thoracic Aortic Aneurysm    NEW REFERRAL with CT CHEST 10/30/17    HPI:  The patient is a 72 year old woman with a long history of 1 pack/day smoking until February 2019 who underwent a lung cancer screening CT on 10/29/2017.  This showed a few abnormalities which prompted further work-up.  The CT scan showed a 3 mm nodular density in the left lower lobe of unclear significance.  There was also a 4.0 cm fusiform ascending aortic aneurysm.  The descending aorta measured 2.7 cm.  There also multiple bilateral fluid attenuation paraspinal masses that were evaluated further with MRI of the thoracic and lumbar spine and are felt to be benign cystic nerve root diverticula.  There is also a hyperdense lesion in the upper pole of left kidney measuring 16 x 12 x 13 mm.  This was evaluated by renal ultrasound showing a 1.2 cm hypoechoic mass that could be a complex cyst versus solid nodule and further evaluation with MRI was recommended.  The MRI of her abdomen is scheduled for tomorrow.  She is also seeing Dr. Michael Boston concerning a right inguinal hernia and plans are being made for surgical treatment of that problem.   Past Medical History:  Diagnosis Date  . Anxiety   . Arthritis    HANDS  . Chronic constipation   . COPD (chronic obstructive pulmonary disease) ([place])   . DDD (degenerative disc disease), lumbar    L2-3/3-4/4-5  . Dysuria   . Frequency of urination   . GERD (gastroesophageal reflux disease)   . Hypertension   . Migraine   . Osteopenia 2019   TO OSTEOPOROSIS   . Pelvic pain in female   . Smokers' cough ([place])   . Urgency of urination    h/o INTERSTIAL CYSTITIS...DR. HUMPHRIES/GRAPEY  . Wears glasses     Past Surgical History:  Procedure Laterality Date  . ABDOMINAL HYSTERECTOMY  1982   W/  UNILATERAL  SALPINGOOPHORECTOMY  . CHOLECYSTECTOMY OPEN  1987   W/  APPENDECTOMY  . COLONOSCOPY  2002  &  12-2012 & 03/19/15  . CYSTO WITH HYDRODISTENSION N/A 02/20/2014   Procedure: [place] AND MARCAINE INSTILLATION;  Surgeon: Bernestine Amass, MD;  [facility];  Service: Urology;  Laterality: N/A;  . LAPAROSCOPIC UNILATERAL SALPINGOOPHORECTOMY  1984  . ROTATOR CUFF REPAIR Right 2003   and BONE SPUR    Family History  Problem Relation Age of Onset  . Esophageal cancer Mother 9  . Lung cancer Father 19  There is no family history of aortic aneurysm or dissection.  There is no family history of aortic valve disease.  There is no family history of connective tissue disorder.  Social History Social History   Tobacco Use  . Smoking status: Current Every Day Smoker    Packs/day: 0.75    Years: 48.00    Pack years: 36.00    Types: Cigarettes  . Smokeless tobacco: Never Used  Substance Use Topics  . Alcohol use: No  . Drug use: No    Current Outpatient Medications  Medication Sig Dispense Refill  . albuterol (ACCUNEB) 0.63 MG/3ML nebulizer solution Take 1 ampule by nebulization 3 (three) times daily as needed for wheezing.    Marland Kitchen albuterol (PROVENTIL HFA;VENTOLIN HFA) 108 (90 Base) MCG/ACT inhaler Inhale 2 puffs into the lungs  every 4 (four) hours as needed for wheezing or shortness of breath.    . baclofen (LIORESAL) 10 MG tablet Take 10 mg by mouth 3 (three) times daily as needed for muscle spasms (with food or milk).    . budesonide-formoterol (SYMBICORT) 160-4.5 MCG/ACT inhaler Inhale 2 puffs into the lungs 2 (two) times daily.    . calcium carbonate (TUMS - DOSED IN MG ELEMENTAL CALCIUM) 500 MG chewable tablet Chew 3 tablets by mouth daily.    . cetirizine (ZYRTEC) 5 MG chewable tablet Chew 5 mg by mouth daily.    . Cholecalciferol (VITAMIN D3) 5000 units CAPS Take 1 capsule by mouth daily.    Marland Kitchen diltiazem (TAZTIA XT) 360 MG 24 hr  capsule Take 360 mg by mouth daily.    Marland Kitchen docusate sodium (COLACE) 100 MG capsule Take 200 mg by mouth 2 (two) times daily.    Marland Kitchen esomeprazole (NEXIUM) 20 MG capsule Take 20 mg by mouth daily.    . fluticasone (FLONASE) 50 MCG/ACT nasal spray Place 1 spray into both nostrils daily.     . montelukast (SINGULAIR) 10 MG tablet Take 10 mg by mouth at bedtime.     . SUMAtriptan (IMITREX) 25 MG tablet Take 25 mg by mouth every 2 (two) hours as needed for migraine or headache. May repeat in 2 hours if headache persists or recurs.    Marland Kitchen tiotropium (SPIRIVA HANDIHALER) 18 MCG inhalation capsule Place 18 mcg into inhaler and inhale daily.     . vitamin B-12 (CYANOCOBALAMIN) 1000 MCG tablet Take 5,000 mcg by mouth daily.     No current facility-administered medications for this visit.     Allergies  Allergen Reactions  . Metoprolol Cough    and wheezing  . Advair Diskus [Fluticasone-Salmeterol] Anxiety  . Penicillins Rash  . Sulfa Antibiotics Rash    Review of Systems  Constitutional: Negative.   HENT: Positive for hearing loss.   Eyes: Negative.   Respiratory: Positive for shortness of breath.        With exertion  Cardiovascular: Negative for chest pain and leg swelling.  Gastrointestinal: Positive for constipation.  Endocrine: Negative.   Genitourinary: Negative.   Musculoskeletal: Positive for arthralgias.  Skin: Negative.   Neurological: Positive for headaches.        neuropathy  Hematological: Bruises/bleeds easily.  Psychiatric/Behavioral: The patient is nervous/anxious.     BP 127/83 (BP Location: Right Arm, Patient Position: Sitting, Cuff Size: Normal)   Pulse 84   Resp 16   Ht 5\' 10"  (1.778 m)   Wt 157 lb (71.2 kg)   SpO2 98% Comment: ON RA  BMI 22.53 kg/m  Physical Exam  Constitutional: She is oriented to person, place, and time. She appears well-developed and well-nourished. No distress.  HENT:  Head: Normocephalic and atraumatic.  Mouth/Throat: Oropharynx is clear  and moist.  Eyes: Pupils are equal, round, and reactive to light. Conjunctivae and EOM are normal.  Neck: Normal range of motion. Neck supple. No JVD present. No thyromegaly present.  Cardiovascular: Normal rate, regular rhythm and normal heart sounds.  No murmur heard. Pulmonary/Chest: Effort normal and breath sounds normal. No respiratory distress.  Abdominal: Soft. Bowel sounds are normal. She exhibits no distension. There is no tenderness.  Musculoskeletal: Normal range of motion. She exhibits no edema.  Lymphadenopathy:    She has no cervical adenopathy.  Neurological: She is alert and oriented to person, place, and time. She has normal strength. No sensory deficit.  Skin: Skin is  warm and dry.  Psychiatric: She has a normal mood and affect.     Diagnostic Tests:  ADDENDUM REPORT: 11/25/2017 11:07  ADDENDUM: Requested addendum concerning hyperdense lesion at upper pole of the LEFT kidney.  When compared to an earlier CT abdomen/pelvis exam of 06/12/2017, the hyperdense lesion which now measures 16 x 12 x 13 mm in size has increased since the previous study when it measured 13 x 9 x 11 mm. Characterization by renal ultrasound recommended.   Electronically Signed   By: Lavonia Dana M.D.   On: 11/25/2017 11:07   Addended by Lavonia Dana, MD on 11/25/2017 11:09 AM    ADDENDUM REPORT: 10/30/2017 15:36  ADDENDUM: Addendum to the recommendations in the impression of the report:  Hyperdense lesion at the upper pole of the LEFT kidney, 16 x 12 x 13 mm in size, potentially a hyperdense cyst though solid lesion not excluded; characterization by renal sonography recommended.   Electronically Signed   By: Lavonia Dana M.D.   On: 10/30/2017 15:36   Addended by Lavonia Dana, MD on 10/30/2017 3:38 PM    Study Result   CLINICAL DATA:  COPD, quit smoking 1 month ago, had the flu last week, history hypertension  EXAM: CT CHEST WITHOUT  CONTRAST  TECHNIQUE: Multidetector CT imaging of the chest was performed following the standard protocol without IV contrast. Sagittal and coronal MPR images reconstructed from axial data set.  COMPARISON:  Chest radiographs 10/09/2017  FINDINGS: Cardiovascular: Minimal atherosclerotic calcification aorta. Aneurysmal dilatation of the ascending thoracic aorta 4.0 cm transverse. Heart unremarkable. No pericardial effusion.  Mediastinum/Nodes: Base of cervical region unremarkable. Esophagus normal appearance. No thoracic adenopathy. Multiple fluid attenuation paraspinal masses centered at neural foramina throughout thoracic spine, with prominent lesions at RIGHT T1-T2 and bilaterally in the lower thoracic spine at RIGHT T11-T12 and LEFT T12-L1.  Lungs/Pleura: Emphysematous changes. Central peribronchial thickening. Subsegmental atelectasis in RIGHT middle lobe. Minimal atelectasis at LEFT base. 3 mm nodular density LEFT lower lobe image 71. Remaining lungs clear. No acute infiltrate, pleural effusion or pneumothorax. Suspect minimal mucous plugging in RIGHT lower lobe.  Upper Abdomen: Post cholecystectomy. Hyperdense lesion at upper pole LEFT kidney 16 x 12 mm diameter question hyperdense cyst versus solid lesion. Remaining visualized upper abdomen unremarkable.  Musculoskeletal: Bones demineralized. No additional osseous findings.  IMPRESSION: Multiple BILATERAL fluid attenuation paraspinal masses; differential diagnosis would include lateral meningoceles, perineural cysts, nerve sheath tumors (neurofibromas, schwannomas); further evaluation by MR imaging of the thoracic spine with and without contrast recommended.  Emphysematous and bronchitic changes with a single nonspecific 3 mm LEFT lower lobe nodule, recommendation below.  No follow-up needed if patient is low-risk. Non-contrast chest CT can be considered in 12 months if patient is high-risk.  This recommendation follows the consensus statement: Guidelines for Management of Incidental Pulmonary Nodules Detected on CT Images: From the Fleischner Society 2017; Radiology 2017; 284:228-243.  Aneurysmal dilatation of the ascending thoracic aorta 4.0 cm in size; recommendation below. Recommend annual imaging followup by CTA or MRA. This recommendation follows 2010 ACCF/AHA/AATS/ACR/ASA/SCA/SCAI/SIR/STS/SVM Guidelines for the Diagnosis and Management of Patients with Thoracic Aortic Disease. Circulation. 2010; 121: N027-O536  Aortic Atherosclerosis (ICD10-I70.0).  Aortic aneurysm NOS (ICD10-I71.9).  Emphysema (ICD10-J43.9).  Electronically Signed:   By: Lavonia Dana M.D. On: 10/29/2017 16:31       Impression:  This 72 year old woman has an incidental 4.0 cm fusiform ascending aortic aneurysm noted on chest CT scan done for lung cancer screening.  It is unclear how  long this is been present.  This is well below the 5.5 cm threshold for surgical treatment.  I reviewed the CT scan of the chest images with her and answered her questions.  Her blood pressure is under good control when I stressed the importance of maintaining good blood pressure control.  I advised her against doing heavy lifting of more than 35 pounds.  Also advised her against taking quinolone antibiotics which have been associated with enlargement of aortic aneurysms.  She also has a tiny nodule in the left lower lobe of lung of unclear significance.  She will require a CT scan of the chest in 1 year to follow-up on this lung nodule as well as the aortic aneurysm.   Plan:   I will see her back in 1 year with a CT scan of the chest without contrast.  I spent 45 minutes performing this consultation and > 50% of this time was spent face to face counseling and coordinating the care of this patient's ascending aortic aneurysm and left lower lobe lung nodule.  Gaye Pollack, MD [facility] [phone]

## 2017-12-17 ENCOUNTER — Ambulatory Visit
Admission: RE | Admit: 2017-12-17 | Discharge: 2017-12-17 | Disposition: A | Discharge status: Home / Self Care | Payer: PPO | Source: Ambulatory Visit | Attending: Family Medicine | Admitting: Family Medicine

## 2017-12-17 DIAGNOSIS — N289 Disorder of kidney and ureter, unspecified: Secondary | ICD-10-CM

## 2017-12-17 DIAGNOSIS — N281 Cyst of kidney, acquired: Secondary | ICD-10-CM | POA: Diagnosis not present

## 2017-12-17 MED ORDER — GADOBENATE DIMEGLUMINE 529 MG/ML IV SOLN
15.0000 mL | Freq: Once | INTRAVENOUS | Status: AC | PRN
  Administered 2017-12-17: 15 mL via INTRAVENOUS

## 2017-12-21 DIAGNOSIS — M5126 Other intervertebral disc displacement, lumbar region: Secondary | ICD-10-CM | POA: Diagnosis not present

## 2017-12-21 DIAGNOSIS — M47814 Spondylosis without myelopathy or radiculopathy, thoracic region: Secondary | ICD-10-CM | POA: Diagnosis not present

## 2017-12-21 DIAGNOSIS — M9905 Segmental and somatic dysfunction of pelvic region: Secondary | ICD-10-CM | POA: Diagnosis not present

## 2017-12-21 DIAGNOSIS — M9903 Segmental and somatic dysfunction of lumbar region: Secondary | ICD-10-CM | POA: Diagnosis not present

## 2017-12-21 DIAGNOSIS — M4804 Spinal stenosis, thoracic region: Secondary | ICD-10-CM | POA: Diagnosis not present

## 2017-12-21 DIAGNOSIS — S39012A Strain of muscle, fascia and tendon of lower back, initial encounter: Secondary | ICD-10-CM | POA: Diagnosis not present

## 2017-12-21 DIAGNOSIS — M9902 Segmental and somatic dysfunction of thoracic region: Secondary | ICD-10-CM | POA: Diagnosis not present

## 2017-12-31 DIAGNOSIS — M9905 Segmental and somatic dysfunction of pelvic region: Secondary | ICD-10-CM | POA: Diagnosis not present

## 2017-12-31 DIAGNOSIS — M47814 Spondylosis without myelopathy or radiculopathy, thoracic region: Secondary | ICD-10-CM | POA: Diagnosis not present

## 2017-12-31 DIAGNOSIS — M5126 Other intervertebral disc displacement, lumbar region: Secondary | ICD-10-CM | POA: Diagnosis not present

## 2017-12-31 DIAGNOSIS — S39012A Strain of muscle, fascia and tendon of lower back, initial encounter: Secondary | ICD-10-CM | POA: Diagnosis not present

## 2017-12-31 DIAGNOSIS — M4804 Spinal stenosis, thoracic region: Secondary | ICD-10-CM | POA: Diagnosis not present

## 2017-12-31 DIAGNOSIS — M9902 Segmental and somatic dysfunction of thoracic region: Secondary | ICD-10-CM | POA: Diagnosis not present

## 2017-12-31 DIAGNOSIS — M9903 Segmental and somatic dysfunction of lumbar region: Secondary | ICD-10-CM | POA: Diagnosis not present

## 2018-01-05 ENCOUNTER — Encounter: Payer: Self-pay | Admitting: Family Medicine

## 2018-01-12 DIAGNOSIS — M9902 Segmental and somatic dysfunction of thoracic region: Secondary | ICD-10-CM | POA: Diagnosis not present

## 2018-01-12 DIAGNOSIS — M9905 Segmental and somatic dysfunction of pelvic region: Secondary | ICD-10-CM | POA: Diagnosis not present

## 2018-01-12 DIAGNOSIS — M47814 Spondylosis without myelopathy or radiculopathy, thoracic region: Secondary | ICD-10-CM | POA: Diagnosis not present

## 2018-01-12 DIAGNOSIS — M9903 Segmental and somatic dysfunction of lumbar region: Secondary | ICD-10-CM | POA: Diagnosis not present

## 2018-01-12 DIAGNOSIS — S39012A Strain of muscle, fascia and tendon of lower back, initial encounter: Secondary | ICD-10-CM | POA: Diagnosis not present

## 2018-01-12 DIAGNOSIS — M5126 Other intervertebral disc displacement, lumbar region: Secondary | ICD-10-CM | POA: Diagnosis not present

## 2018-01-12 DIAGNOSIS — M4804 Spinal stenosis, thoracic region: Secondary | ICD-10-CM | POA: Diagnosis not present

## 2018-01-15 DIAGNOSIS — R05 Cough: Secondary | ICD-10-CM | POA: Diagnosis not present

## 2018-01-15 DIAGNOSIS — J329 Chronic sinusitis, unspecified: Secondary | ICD-10-CM | POA: Diagnosis not present

## 2018-01-29 DIAGNOSIS — K429 Umbilical hernia without obstruction or gangrene: Secondary | ICD-10-CM | POA: Diagnosis not present

## 2018-01-29 DIAGNOSIS — K4090 Unilateral inguinal hernia, without obstruction or gangrene, not specified as recurrent: Secondary | ICD-10-CM | POA: Diagnosis not present

## 2018-01-29 DIAGNOSIS — K4190 Unilateral femoral hernia, without obstruction or gangrene, not specified as recurrent: Secondary | ICD-10-CM | POA: Diagnosis not present

## 2018-01-29 DIAGNOSIS — I898 Other specified noninfective disorders of lymphatic vessels and lymph nodes: Secondary | ICD-10-CM | POA: Diagnosis not present

## 2018-01-29 DIAGNOSIS — K4120 Bilateral femoral hernia, without obstruction or gangrene, not specified as recurrent: Secondary | ICD-10-CM | POA: Diagnosis not present

## 2018-01-29 DIAGNOSIS — M67451 Ganglion, right hip: Secondary | ICD-10-CM | POA: Diagnosis not present

## 2018-03-04 DIAGNOSIS — J449 Chronic obstructive pulmonary disease, unspecified: Secondary | ICD-10-CM | POA: Diagnosis not present

## 2018-03-04 DIAGNOSIS — F322 Major depressive disorder, single episode, severe without psychotic features: Secondary | ICD-10-CM | POA: Diagnosis not present

## 2018-03-04 DIAGNOSIS — M810 Age-related osteoporosis without current pathological fracture: Secondary | ICD-10-CM | POA: Diagnosis not present

## 2018-03-04 DIAGNOSIS — F419 Anxiety disorder, unspecified: Secondary | ICD-10-CM | POA: Diagnosis not present

## 2018-03-08 DIAGNOSIS — M7542 Impingement syndrome of left shoulder: Secondary | ICD-10-CM | POA: Diagnosis not present

## 2018-03-31 ENCOUNTER — Ambulatory Visit: Payer: PPO | Attending: Family Medicine | Admitting: Physical Therapy

## 2018-03-31 ENCOUNTER — Encounter: Payer: Self-pay | Admitting: Physical Therapy

## 2018-03-31 ENCOUNTER — Other Ambulatory Visit: Payer: Self-pay

## 2018-03-31 DIAGNOSIS — M6281 Muscle weakness (generalized): Secondary | ICD-10-CM | POA: Diagnosis not present

## 2018-03-31 DIAGNOSIS — M62838 Other muscle spasm: Secondary | ICD-10-CM | POA: Diagnosis not present

## 2018-03-31 DIAGNOSIS — R293 Abnormal posture: Secondary | ICD-10-CM | POA: Diagnosis not present

## 2018-03-31 NOTE — Patient Instructions (Signed)
Toileting Techniques for Bowel Movements (Defecation) Using your belly (abdomen) and pelvic floor muscles to have a bowel movement is usually instinctive.  Sometimes people can have problems with these muscles and have to relearn proper defecation (emptying) techniques.  If you have weakness in your muscles, organs that are falling out, decreased sensation in your pelvis, or ignore your urge to go, you may find yourself straining to have a bowel movement.  You are straining if you are: . holding your breath or taking in a huge gulp of air and holding it  . keeping your lips and jaw tensed and closed tightly . turning red in the face because of excessive pushing or forcing . developing or worsening your  hemorrhoids . getting faint while pushing . not emptying completely and have to defecate many times a day  If you are straining, you are actually making it harder for yourself to have a bowel movement.  Many people find they are pulling up with the pelvic floor muscles and closing off instead of opening the anus. Due to lack pelvic floor relaxation and coordination the abdominal muscles, one has to work harder to push the feces out.  Many people have never been taught how to defecate efficiently and effectively.  Notice what happens to your body when you are having a bowel movement.  While you are sitting on the toilet pay attention to the following areas: . Jaw and mouth position . Angle of your hips   . Whether your feet touch the ground or not . Arm placement  . Spine position . Waist . Belly tension . Anus (opening of the anal canal)  An Evacuation/Defecation Plan   Here are the 4 basic points:  1. Lean forward enough for your elbows to rest on your knees 2. Support your feet on the floor or use a low stool if your feet don't touch the floor  3. Push out your belly as if you have swallowed a beach ball-you should feel a widening of your waist 4. Open and relax your pelvic floor muscles,  rather than tightening around the anus      The following conditions my require modifications to your toileting posture:  . If you have had surgery in the past that limits your back, hip, pelvic, knee or ankle flexibility . Constipation   Your healthcare practitioner may make the following additional suggestions and adjustments:  1) Sit on the toilet  a) Make sure your feet are supported. b) Notice your hip angle and spine position-most people find it effective to lean forward or raise their knees, which can help the muscles around the anus to relax  c) When you lean forward, place your forearms on your thighs for support  2) Relax suggestions a) Breath deeply in through your nose and out slowly through your mouth as if you are smelling the flowers and blowing out the candles. b) To become aware of how to relax your muscles, contracting and releasing muscles can be helpful.  Pull your pelvic floor muscles in tightly by using the image of holding back gas, or closing around the anus (visualize making a circle smaller) and lifting the anus up and in.  Then release the muscles and your anus should drop down and feel open. Repeat 5 times ending with the feeling of relaxation. c) Keep your pelvic floor muscles relaxed; let your belly bulge out. d) The digestive tract starts at the mouth and ends at the anal opening, so be   sure to relax both ends of the tube.  Place your tongue on the roof of your mouth with your teeth separated.  This helps relax your mouth and will help to relax the anus at the same time.  3) Empty (defecation) a) Keep your pelvic floor and sphincter relaxed, then bulge your anal muscles.  Make the anal opening wide.  b) Stick your belly out as if you have swallowed a beach ball. c) Make your belly wall hard using your belly muscles while continuing to breathe. Doing this makes it easier to open your anus. d) Breath out and give a grunt (or try using other sounds such as  ahhhh, shhhhh, ohhhh or grrrrrrr).  4) Finish a) As you finish your bowel movement, pull the pelvic floor muscles up and in.  This will leave your anus in the proper place rather than remaining pushed out and down. If you leave your anus pushed out and down, it will start to feel as though that is normal and give you incorrect signals about needing to have a bowel movement.   [facility] [address] [place], [place] [address]  

## 2018-03-31 NOTE — Therapy (Signed)
[facility] [address] [place], [place], [address] Phone: [phone]   Fax:  [phone]  Physical Therapy Evaluation  Patient Details  Name: Catherine [name] MRN: [id number] Date of Birth: 1946/01/15 Referring Provider: Jonathon Jordan, MD   Encounter Date: 03/31/2018  PT End of Session - 03/31/18 1551    Visit Number  1    Date for PT Re-Evaluation  05/26/18    Authorization Type  medicare    PT Start Time  1230    PT Stop Time  1313    PT Time Calculation (min)  43 min    Activity Tolerance  Patient tolerated treatment well    Behavior During Therapy  [facility] for tasks assessed/performed       Past Medical History:  Diagnosis Date  . Anxiety   . Arthritis    HANDS  . Chronic constipation   . COPD (chronic obstructive pulmonary disease) ([place])   . DDD (degenerative disc disease), lumbar    L2-3/3-4/4-5  . Dysuria   . Frequency of urination   . GERD (gastroesophageal reflux disease)   . Hypertension   . Migraine   . Osteopenia 2019   TO OSTEOPOROSIS   . Pelvic pain in female   . Smokers' cough ([place])   . Urgency of urination    h/o INTERSTIAL CYSTITIS...DR. HUMPHRIES/GRAPEY  . Wears glasses     Past Surgical History:  Procedure Laterality Date  . ABDOMINAL HYSTERECTOMY  1982   W/  UNILATERAL SALPINGOOPHORECTOMY  . CHOLECYSTECTOMY OPEN  1987   W/  APPENDECTOMY  . COLONOSCOPY  2002  &  12-2012 & 03/19/15  . CYSTO WITH HYDRODISTENSION N/A 02/20/2014   Procedure: [place] AND MARCAINE INSTILLATION;  Surgeon: Bernestine Amass, MD;  Location: [facility];  Service: Urology;  Laterality: N/A;  . LAPAROSCOPIC UNILATERAL SALPINGOOPHORECTOMY  1984  . ROTATOR CUFF REPAIR Right 2003   and BONE SPUR    There were no vitals filed for this visit.   Subjective Assessment - 03/31/18 1231    Subjective  Pt had inguinal hernia repair on right and ganglion cyst  removal on right on June 28th this year.  She had a lymph cyst 2017 and had therapy after that.  Now she is having some pain in grion and having difficutly doing the exercises the doctor gave her due to difficulty balancing. Left shoulder has been painful since the surgery also since having to sleep on that side.    Pertinent History  osteoporosis    Limitations  Sitting    How long can you sit comfortably?  30 minutes    Patient Stated Goals  be able to do the exercises    Currently in Pain?  Yes    Pain Score  6     Pain Location  Groin    Pain Orientation  Right    Pain Descriptors / Indicators  Burning;Sharp    Pain Type  Acute pain    Pain Radiating Towards  inguinal canal on right side and a little above that feels like a tight muscle    Pain Onset  More than a month ago    Pain Frequency  Intermittent    Aggravating Factors   sitting    Pain Relieving Factors  standing up, pillow under the seat belt    Effect of Pain on Daily Activities  driving    Multiple Pain Sites  No  [facility] PT Assessment - 03/31/18 0001      Assessment   Medical Diagnosis  R10.30 (ICD-10-CM) - Lower abdominal pain, unspecified    Referring Provider  Jonathon Jordan, MD    Onset Date/Surgical Date  01/29/18    Prior Therapy  Not for this recent surgery      Precautions   Precautions  None      Restrictions   Weight Bearing Restrictions  No      Balance Screen   Has the patient fallen in the past 6 months  No      [place] residence    Living Arrangements  Spouse/significant other      Prior Function   Level of [place]  Retired    Leisure  would like to get to a silver sneakers      Cognition   Overall Cognitive Status  Within Functional Limits for tasks assessed      Observation/Other Assessments   Focus on Therapeutic Outcomes (FOTO)   44% limited      Posture/Postural Control   Posture/Postural Control   Postural limitations    Postural Limitations  Rounded Shoulders;Forward head;Increased thoracic kyphosis;Increased lumbar lordosis;Flexed trunk      ROM / Strength   AROM / PROM / Strength  AROM;Strength;PROM      AROM   Overall AROM Comments  lumbar flexion 50% and painful      PROM   Overall PROM Comments  hip flexion and IR limited 25% and pain on Rt side      Strength   Overall Strength Comments  Rt hip abduction 4/5; ER 3+/5 with pain; flexion 3+/5 with pain      Flexibility   Soft Tissue Assessment /Muscle Length  yes    Hamstrings  50% limited Rt; 40% limited Lt      Palpation   Palpation comment  tender and tight TFL, glutes, quads on right side; right inguinal region tight and inflamed      Special Tests    Special Tests  Sacrolliac Tests    Sacroiliac Tests   Pelvic Distraction      Pelvic Dictraction   Findings  Positive      Pelvic Compression   Findings  Negative      Transfers   Five time sit to stand comments   medial knee collapse; 17 seconds      Ambulation/Gait   Gait Pattern  Trunk flexed                Objective measurements completed on examination: See above findings.      [place] Adult PT Treatment/Exercise - 03/31/18 0001      Self-Care   Self-Care  Other Self-Care Comments    Other Self-Care Comments   toilet techniques and HEP             PT Education - 03/31/18 1555    Education Details  toilet techniqe;  Access Code: [email or web address]     Person(s) Educated  Patient    Methods  Explanation;Demonstration;Handout;Verbal cues    Comprehension  Verbalized understanding;Returned demonstration       PT Short Term Goals - 03/31/18 1718      PT SHORT TERM GOAL #1   Title  pt will be independent with initial HEP    Time  3    Period  Weeks    Target Date  04/21/18  PT SHORT TERM GOAL #2   Title  Pt will report 25% less pain    Time  3    Period  Weeks    Status  New    Target Date  04/21/18        PT Long Term  Goals - 03/31/18 1718      PT LONG TERM GOAL #1   Title  Pt will demonstrate improved MMT of right hip flexion and ER to at least 4/5 without pain for improved gait and functional mechanics    Time  6    Period  Weeks    Status  New    Target Date  05/12/18      PT LONG TERM GOAL #2   Title  Pt will be ind with advanced HEP    Time  6    Period  Weeks    Status  New    Target Date  05/12/18      PT LONG TERM GOAL #3   Title  Pt will report 50% less pain    Time  6    Period  Weeks    Status  New    Target Date  05/12/18      PT LONG TERM GOAL #4   Title  pt will demonstrate 5 x sit to stand in 14 sec or less    Baseline  17 sec    Time  6    Period  Weeks    Status  New    Target Date  05/12/18             Plan - 03/31/18 1555    Clinical Impression Statement  Pt presents to skilled PT due to right groin pain.  She recently had surgery in that area to remove ganglion cyst.  She is still having significant pain and weakness.  Pt has tender hip flexor, TFL, and glutes on Rt.  She has inflamed and restricted tissue around inguinal ligament on right side.  Pt also has posture abnormalities and osteoporosis.  Pt has flexed gait and posture. Pt has LE and core weakness as mentioned above.  Pt will benefit from skilled PT to address impairments and return to maximum function.    History and Personal Factors relevant to plan of care:  multiple abdominal surgeries, osteoporosis    Clinical Presentation  Stable    Clinical Presentation due to:  pt is stable    Clinical Decision Making  Moderate    Rehab Potential  Good    PT Frequency  2x / week    PT Duration  8 weeks    PT Treatment/Interventions  ADLs/Self Care Home Management;Biofeedback;Cryotherapy;Electrical Stimulation;Iontophoresis 4mg /ml Dexamethasone;Moist Heat;Therapeutic activities;Therapeutic exercise;Neuromuscular re-education;Patient/family education;Manual techniques;Passive range of motion;Dry needling;Taping     PT Next Visit Plan  release abdominal fascial restrictions, core and hip strengthening, thoracic extension and mobility, posture, STM quads, glutes, and hip flexors    PT Home Exercise Plan   Access Code: [email or web address]     Recommended Other Services  eval 03/31/18    Consulted and Agree with Plan of Care  Patient       Patient will benefit from skilled therapeutic intervention in order to improve the following deficits and impairments:  Pain, Increased muscle spasms, Postural dysfunction, Decreased strength, Decreased range of motion, Impaired flexibility  Visit Diagnosis: Muscle weakness (generalized)  Other muscle spasm  Abnormal posture     Problem List Patient Active Problem List   Diagnosis Date  Noted  . Anxiety   . Urgency of urination   . DDD (degenerative disc disease), lumbar   . Osteopenia 08/04/2017  . Right sided sciatica 02/18/2017  . Piriformis muscle pain 02/18/2017  . Right groin pain 11/04/2016  . Neuropathy 11/04/2016  . Numbness 11/04/2016  . Depression with anxiety 11/04/2016  . Atypical chest pain 05/08/2014    Zannie Cove, PT 03/31/2018, 5:35 PM  [facility] [address] [place], [place], [address] Phone: [phone]   Fax:  [phone]  Name: Catherine [name] MRN: [id number] Date of Birth: 1946/04/25

## 2018-04-02 ENCOUNTER — Ambulatory Visit: Payer: PPO | Admitting: Physical Therapy

## 2018-04-02 ENCOUNTER — Encounter: Payer: Self-pay | Admitting: Physical Therapy

## 2018-04-02 DIAGNOSIS — R293 Abnormal posture: Secondary | ICD-10-CM

## 2018-04-02 DIAGNOSIS — M6281 Muscle weakness (generalized): Secondary | ICD-10-CM

## 2018-04-02 DIAGNOSIS — M62838 Other muscle spasm: Secondary | ICD-10-CM

## 2018-04-02 NOTE — Therapy (Signed)
[facility] [address] [place], [place], [address] Phone: [phone]   Fax:  [phone]  Physical Therapy Treatment  Patient Details  Name: Catherine [name] MRN: [id number] Date of Birth: 1946/04/19 Referring Provider: Jonathon Jordan, MD   Encounter Date: 04/02/2018  PT End of Session - 04/02/18 1058    Visit Number  2    Date for PT Re-Evaluation  05/26/18    Authorization Type  medicare    PT Start Time  1058    PT Stop Time  1145    PT Time Calculation (min)  47 min    Activity Tolerance  Patient tolerated treatment well    Behavior During Therapy  [facility] for tasks assessed/performed       Past Medical History:  Diagnosis Date  . Anxiety   . Arthritis    HANDS  . Chronic constipation   . COPD (chronic obstructive pulmonary disease) ([place])   . DDD (degenerative disc disease), lumbar    L2-3/3-4/4-5  . Dysuria   . Frequency of urination   . GERD (gastroesophageal reflux disease)   . Hypertension   . Migraine   . Osteopenia 2019   TO OSTEOPOROSIS   . Pelvic pain in female   . Smokers' cough ([place])   . Urgency of urination    h/o INTERSTIAL CYSTITIS...DR. HUMPHRIES/GRAPEY  . Wears glasses     Past Surgical History:  Procedure Laterality Date  . ABDOMINAL HYSTERECTOMY  1982   W/  UNILATERAL SALPINGOOPHORECTOMY  . CHOLECYSTECTOMY OPEN  1987   W/  APPENDECTOMY  . COLONOSCOPY  2002  &  12-2012 & 03/19/15  . CYSTO WITH HYDRODISTENSION N/A 02/20/2014   Procedure: [place] AND MARCAINE INSTILLATION;  Surgeon: Bernestine Amass, MD;  Location: [facility];  Service: Urology;  Laterality: N/A;  . LAPAROSCOPIC UNILATERAL SALPINGOOPHORECTOMY  1984  . ROTATOR CUFF REPAIR Right 2003   and BONE SPUR    There were no vitals filed for this visit.  Subjective Assessment - 04/02/18 1059    Subjective  A little pain this morning and constipation.    Currently in  Pain?  Yes    Pain Score  7     Pain Location  Groin    Pain Orientation  Right    Pain Descriptors / Indicators  Sore    Multiple Pain Sites  No                       [facility] Adult PT Treatment/Exercise - 04/02/18 0001      Self-Care   Other Self-Care Comments   Abdominal massage for constipation, Rt anterior hip muscle soft tissue work, RTLe MLD      Lumbar Exercises: Supine   Bridge  5 reps;3 seconds      Lumbar Exercises: Sidelying   Clam  Right;10 reps   TC, instructional cues for proper technique     Manual Therapy   Manual therapy comments  RTLE MLD    Soft tissue mobilization  Anterior RT hip and quad    Myofascial Release  abdominals             PT Education - 04/02/18 1133    Education Details  Massage for constipation, RTLE Manual lymph drainage, RT anterior hip massage, added bridges and SL clamshell exercise to HEP    Person(s) Educated  Patient    Methods  Explanation;Demonstration;Verbal cues;Tactile cues;Handout    Comprehension  Returned demonstration;Verbalized understanding       PT Short Term Goals - 04/02/18 1149      PT SHORT TERM GOAL #1   Title  pt will be independent with initial HEP    Period  Weeks    Status  Achieved        PT Long Term Goals - 03/31/18 1718      PT LONG TERM GOAL #1   Title  Pt will demonstrate improved MMT of right hip flexion and ER to at least 4/5 without pain for improved gait and functional mechanics    Time  6    Period  Weeks    Status  New    Target Date  05/12/18      PT LONG TERM GOAL #2   Title  Pt will be ind with advanced HEP    Time  6    Period  Weeks    Status  New    Target Date  05/12/18      PT LONG TERM GOAL #3   Title  Pt will report 50% less pain    Time  6    Period  Weeks    Status  New    Target Date  05/12/18      PT LONG TERM GOAL #4   Title  pt will demonstrate 5 x sit to stand in 14 sec or less    Baseline  17 sec    Time  6    Period  Weeks    Status   New    Target Date  05/12/18            Plan - 04/02/18 1058    Clinical Impression Statement  Pt reports having some moderate pain this morning that she thinks is exacerbated by her constipation. Pt was educated in abdominal massage for constipation. She also reports swelling in her RT groin area she thinks is from her lymph nodes. Taught pt basic MLD for RTLE swelling as  she has had one lymph node removed. PTa also worked on her anteriolateral hip muscles as they are tight. Pt felt less pain at the end of session. She is compliant in her initial HEP, meeeting STG#1.     Rehab Potential  Good    PT Frequency  2x / week    PT Duration  8 weeks    PT Treatment/Interventions  ADLs/Self Care Home Management;Biofeedback;Cryotherapy;Electrical Stimulation;Iontophoresis 4mg /ml Dexamethasone;Moist Heat;Therapeutic activities;Therapeutic exercise;Neuromuscular re-education;Patient/family education;Manual techniques;Passive range of motion;Dry needling;Taping    PT Next Visit Plan  release abdominal fascial restrictions, core and hip strengthening, thoracic extension and mobility, posture, STM quads, glutes, and hip flexors    PT Home Exercise Plan   Access Code: [email or web address]     Consulted and Agree with Plan of Care  Patient       Patient will benefit from skilled therapeutic intervention in order to improve the following deficits and impairments:  Pain, Increased muscle spasms, Postural dysfunction, Decreased strength, Decreased range of motion, Impaired flexibility  Visit Diagnosis: Muscle weakness (generalized)  Other muscle spasm  Abnormal posture     Problem List Patient Active Problem List   Diagnosis Date Noted  . Anxiety   . Urgency of urination   . DDD (degenerative disc disease), lumbar   . Osteopenia 08/04/2017  . Right sided sciatica 02/18/2017  . Piriformis muscle pain 02/18/2017  . Right groin pain 11/04/2016  . Neuropathy 11/04/2016  . Numbness 11/04/2016  .  Depression with anxiety 11/04/2016  . Atypical chest pain 05/08/2014    Samra Pesch, PTA 04/02/2018, 11:52 AM  [facility] [address], [place], [place], [address] Phone: [phone]   Fax:  [phone]  Name: JAYMES REVELS MRN: [id number] Date of Birth: Dec 11, 1945  Access Code: [email or web address]  URL: [email or web address]  Date: 04/02/2018  Prepared by: Myrene Galas   Exercises  Seated Hamstring Stretch - 3 reps - 20 hold - 2x daily - 7x weekly  Supine Bridge - 10 reps - 2 sets - 3 hold - 2x daily - 7x weekly  Clamshell - 10 reps - 2 sets - 7x weekly

## 2018-04-07 ENCOUNTER — Ambulatory Visit: Payer: PPO | Attending: Family Medicine | Admitting: Physical Therapy

## 2018-04-07 ENCOUNTER — Encounter: Payer: Self-pay | Admitting: Physical Therapy

## 2018-04-07 DIAGNOSIS — R293 Abnormal posture: Secondary | ICD-10-CM

## 2018-04-07 DIAGNOSIS — M62838 Other muscle spasm: Secondary | ICD-10-CM

## 2018-04-07 DIAGNOSIS — M6281 Muscle weakness (generalized): Secondary | ICD-10-CM | POA: Diagnosis not present

## 2018-04-07 DIAGNOSIS — M7542 Impingement syndrome of left shoulder: Secondary | ICD-10-CM | POA: Diagnosis not present

## 2018-04-07 NOTE — Therapy (Signed)
[facility] [address] [place], [place], [address] Phone: [phone]   Fax:  [phone]  Physical Therapy Treatment  Patient Details  Name: BEVERLYANN BROXTERMAN MRN: [id number] Date of Birth: 11/30/1945 Referring Provider: Jonathon Jordan, MD   Encounter Date: 04/07/2018  PT End of Session - 04/07/18 1029    Visit Number  3    Date for PT Re-Evaluation  05/26/18    Authorization Type  medicare    PT Start Time  1015    PT Stop Time  1055    PT Time Calculation (min)  40 min    Activity Tolerance  Patient tolerated treatment well    Behavior During Therapy  [facility] for tasks assessed/performed       Past Medical History:  Diagnosis Date  . Anxiety   . Arthritis    HANDS  . Chronic constipation   . COPD (chronic obstructive pulmonary disease) ([place])   . DDD (degenerative disc disease), lumbar    L2-3/3-4/4-5  . Dysuria   . Frequency of urination   . GERD (gastroesophageal reflux disease)   . Hypertension   . Migraine   . Osteopenia 2019   TO OSTEOPOROSIS   . Pelvic pain in female   . Smokers' cough ([place])   . Urgency of urination    h/o INTERSTIAL CYSTITIS...DR. HUMPHRIES/GRAPEY  . Wears glasses     Past Surgical History:  Procedure Laterality Date  . ABDOMINAL HYSTERECTOMY  1982   W/  UNILATERAL SALPINGOOPHORECTOMY  . CHOLECYSTECTOMY OPEN  1987   W/  APPENDECTOMY  . COLONOSCOPY  2002  &  12-2012 & 03/19/15  . CYSTO WITH HYDRODISTENSION N/A 02/20/2014   Procedure: [place] AND MARCAINE INSTILLATION;  Surgeon: Bernestine Amass, MD;  Location: [facility];  Service: Urology;  Laterality: N/A;  . LAPAROSCOPIC UNILATERAL SALPINGOOPHORECTOMY  1984  . ROTATOR CUFF REPAIR Right 2003   and BONE SPUR    There were no vitals filed for this visit.  Subjective Assessment - 04/07/18 1028    Subjective  I have been doing the exercises consistently and feeling better     Pertinent History  osteoporosis    Patient Stated Goals  be able to do the exercises    Currently in Pain?  Yes    Pain Score  5                        [facility] Adult PT Treatment/Exercise - 04/07/18 0001      Exercises   Exercises  Lumbar      Lumbar Exercises: Aerobic   Nustep  L1 x 5 min ; seat 12      Lumbar Exercises: Standing   Other Standing Lumbar Exercises  standing with foam roll against wall for posture - 10 x 5 sec    Other Standing Lumbar Exercises  hip extension 10 x each side      Lumbar Exercises: Supine   Ab Set  10 reps    Clam  15 reps;Limitations    Clam Limitations  single leg red band    Other Supine Lumbar Exercises  thoracic extension in supine with UE flexion to 90 - 10x      Manual Therapy   Soft tissue mobilization  Rt anterior hip and quads    Myofascial Release  abdominal             PT Education - 04/07/18 1351  Education Details  --    Person(s) Educated  --    Methods  --    Comprehension  --       PT Short Term Goals - 04/02/18 1149      PT SHORT TERM GOAL #1   Title  pt will be independent with initial HEP    Period  Weeks    Status  Achieved        PT Long Term Goals - 03/31/18 1718      PT LONG TERM GOAL #1   Title  Pt will demonstrate improved MMT of right hip flexion and ER to at least 4/5 without pain for improved gait and functional mechanics    Time  6    Period  Weeks    Status  New    Target Date  05/12/18      PT LONG TERM GOAL #2   Title  Pt will be ind with advanced HEP    Time  6    Period  Weeks    Status  New    Target Date  05/12/18      PT LONG TERM GOAL #3   Title  Pt will report 50% less pain    Time  6    Period  Weeks    Status  New    Target Date  05/12/18      PT LONG TERM GOAL #4   Title  pt will demonstrate 5 x sit to stand in 14 sec or less    Baseline  17 sec    Time  6    Period  Weeks    Status  New    Target Date  05/12/18            Plan -  04/07/18 1352    Clinical Impression Statement  Pt has been having less pain and has been doing the exercises.  Pt was able to progress strength with addition of standing exercises.  Pt continues to have restrictions on abdomen and anterior right thigh.  She needed cues for posture during exercises today.  Pt will benefit from skilled PT to progress posture and strength.    PT Treatment/Interventions  ADLs/Self Care Home Management;Biofeedback;Cryotherapy;Electrical Stimulation;Iontophoresis 4mg /ml Dexamethasone;Moist Heat;Therapeutic activities;Therapeutic exercise;Neuromuscular re-education;Patient/family education;Manual techniques;Passive range of motion;Dry needling;Taping    PT Home Exercise Plan   Access Code: [email or web address]     Consulted and Agree with Plan of Care  Patient       Patient will benefit from skilled therapeutic intervention in order to improve the following deficits and impairments:  Pain, Increased muscle spasms, Postural dysfunction, Decreased strength, Decreased range of motion, Impaired flexibility  Visit Diagnosis: Muscle weakness (generalized)  Other muscle spasm  Abnormal posture     Problem List Patient Active Problem List   Diagnosis Date Noted  . Anxiety   . Urgency of urination   . DDD (degenerative disc disease), lumbar   . Osteopenia 08/04/2017  . Right sided sciatica 02/18/2017  . Piriformis muscle pain 02/18/2017  . Right groin pain 11/04/2016  . Neuropathy 11/04/2016  . Numbness 11/04/2016  . Depression with anxiety 11/04/2016  . Atypical chest pain 05/08/2014    Zannie Cove, PT 04/07/2018, 1:57 PM  [facility] [address], [place], [place], [address] Phone: [phone]   Fax:  [phone]  Name: KLEIGH HOELZER MRN: [id number] Date of Birth: 1946-05-17

## 2018-04-14 ENCOUNTER — Ambulatory Visit: Payer: PPO | Admitting: Physical Therapy

## 2018-04-14 ENCOUNTER — Encounter: Payer: Self-pay | Admitting: Physical Therapy

## 2018-04-14 DIAGNOSIS — M6281 Muscle weakness (generalized): Secondary | ICD-10-CM

## 2018-04-14 DIAGNOSIS — R293 Abnormal posture: Secondary | ICD-10-CM

## 2018-04-14 DIAGNOSIS — M62838 Other muscle spasm: Secondary | ICD-10-CM

## 2018-04-14 NOTE — Patient Instructions (Signed)
Access Code: [email or web address]  URL: [email or web address]  Date: 04/14/2018  Prepared by: Lovett Calender   Exercises  Seated Hamstring Stretch - 3 reps - 1 sets - 30 sec hold - 1x daily - 7x weekly  Supine Figure 4 Piriformis Stretch - 3 reps - 1 sets - 30 sec hold - 1x daily - 7x weekly  Bent Knee Fallouts - 3 reps - 1 sets - 30 sec hold - 1x daily - 7x weekly  Supine Butterfly Groin Stretch - 3 reps - 1 sets - 30 sec hold - 1x daily - 7x weekly  Hooklying Single Knee to Chest Stretch - 5 reps - 1 sets - 20 sec hold - 1x daily - 7x weekly  Hooklying Isometric Clamshell - 10 reps - 2 sets - 1x daily - 7x weekly

## 2018-04-14 NOTE — Therapy (Signed)
[facility] [address], [place], [place], [address] Phone: [phone]   Fax:  [phone]  Physical Therapy Treatment  Patient Details  Name: DORENDA PFANNENSTIEL MRN: [id number] Date of Birth: [birth year] Referring Provider: Jonathon Jordan, MD   Encounter Date: 04/14/2018  PT End of Session - 04/14/18 1531    Visit Number  4    Date for PT Re-Evaluation  05/26/18    Authorization Type  medicare    PT Start Time  1529    PT Stop Time  1611    PT Time Calculation (min)  42 min    Activity Tolerance  Patient tolerated treatment well    Behavior During Therapy  [facility] for tasks assessed/performed       Past Medical History:  Diagnosis Date  . Anxiety   . Arthritis    HANDS  . Chronic constipation   . COPD (chronic obstructive pulmonary disease) ([place])   . DDD (degenerative disc disease), lumbar    L2-3/3-4/4-5  . Dysuria   . Frequency of urination   . GERD (gastroesophageal reflux disease)   . Hypertension   . Migraine   . Osteopenia 2019   TO OSTEOPOROSIS   . Pelvic pain in female   . Smokers' cough ([place])   . Urgency of urination    h/o INTERSTIAL CYSTITIS...DR. HUMPHRIES/GRAPEY  . Wears glasses     Past Surgical History:  Procedure Laterality Date  . ABDOMINAL HYSTERECTOMY  1982   W/  UNILATERAL SALPINGOOPHORECTOMY  . CHOLECYSTECTOMY OPEN  1987   W/  APPENDECTOMY  . COLONOSCOPY  2002  &  12-2012 & 03/19/15  . CYSTO WITH HYDRODISTENSION N/A 02/20/2014   Procedure: [place] AND MARCAINE INSTILLATION;  Surgeon: Bernestine Amass, MD;  Location: [facility];  Service: Urology;  Laterality: N/A;  . LAPAROSCOPIC UNILATERAL SALPINGOOPHORECTOMY  1984  . ROTATOR CUFF REPAIR Right 2003   and BONE SPUR    There were no vitals filed for this visit.  Subjective Assessment - 04/14/18 1532    Subjective  My Rt hip is stiff and I am improving a lot and is no longer  constant, but still pain when I sit    Pertinent History  osteoporosis    Limitations  Sitting    How long can you sit comfortably?  30 minutes    Patient Stated Goals  be able to do the exercises    Currently in Pain?  Yes    Pain Score  4     Pain Location  Hip    Pain Orientation  Right    Pain Descriptors / Indicators  Sore    Pain Type  Acute pain    Pain Onset  More than a month ago    Pain Frequency  Intermittent    Aggravating Factors   sitting    Multiple Pain Sites  No                       [facility] Adult PT Treatment/Exercise - 04/14/18 0001      Exercises   Exercises  Lumbar;Knee/Hip      Lumbar Exercises: Stretches   Single Knee to Chest Stretch  3 reps;20 seconds    Figure 4 Stretch  2 reps;30 seconds    Other Lumbar Stretch Exercise  butterfly      Lumbar Exercises: Aerobic   Nustep  L1 x 8 min ; seat 14  PT present to assess status     Lumbar Exercises: Supine   Clam  15 reps;Limitations    Clam Limitations  single leg red band    Bridge  5 reps;3 seconds      Manual Therapy   Manual Therapy  Joint mobilization    Joint Mobilization  A/P and lateral hip glides grade III 4 bouts x 30 sec             PT Education - 04/14/18 1611    Education Details   Access Code: [email or web address]     Person(s) Educated  Patient    Methods  Explanation;Demonstration;Handout;Verbal cues;Tactile cues    Comprehension  Verbalized understanding;Returned demonstration       PT Short Term Goals - 04/02/18 1149      PT SHORT TERM GOAL #1   Title  pt will be independent with initial HEP    Period  Weeks    Status  Achieved        PT Long Term Goals - 04/14/18 1605      PT LONG TERM GOAL #1   Title  Pt will demonstrate improved MMT of right hip flexion and ER to at least 4/5 without pain for improved gait and functional mechanics    Status  On-going      PT LONG TERM GOAL #2   Title  Pt will be ind with advanced HEP    Status  On-going      PT  LONG TERM GOAL #3   Title  Pt will report 50% less pain    Baseline  30% improved    Status  On-going      PT LONG TERM GOAL #4   Title  pt will demonstrate 5 x sit to stand in 14 sec or less    Status  On-going            Plan - 04/14/18 1705    Clinical Impression Statement  Pt is having 30% less pain overall.  She was able to progress with additions to HEP.  Pt had no increased pain and responded well to hip mobs.  Pt will continue to benefit from skilled PT to address ROM and strength deficits.    PT Treatment/Interventions  ADLs/Self Care Home Management;Biofeedback;Cryotherapy;Electrical Stimulation;Iontophoresis 4mg /ml Dexamethasone;Moist Heat;Therapeutic activities;Therapeutic exercise;Neuromuscular re-education;Patient/family education;Manual techniques;Passive range of motion;Dry needling;Taping    PT Next Visit Plan  hip ROM and strength, posture, hip mobs and STM as needed    PT Home Exercise Plan   Access Code: [email or web address]     Consulted and Agree with Plan of Care  Patient       Patient will benefit from skilled therapeutic intervention in order to improve the following deficits and impairments:  Pain, Increased muscle spasms, Postural dysfunction, Decreased strength, Decreased range of motion, Impaired flexibility  Visit Diagnosis: Muscle weakness (generalized)  Other muscle spasm  Abnormal posture     Problem List Patient Active Problem List   Diagnosis Date Noted  . Anxiety   . Urgency of urination   . DDD (degenerative disc disease), lumbar   . Osteopenia 08/04/2017  . Right sided sciatica 02/18/2017  . Piriformis muscle pain 02/18/2017  . Right groin pain 11/04/2016  . Neuropathy 11/04/2016  . Numbness 11/04/2016  . Depression with anxiety 11/04/2016  . Atypical chest pain 05/08/2014    Zannie Cove, PT 04/14/2018, 5:10 PM  [facility] [address], [place], [place], [address] Phone:  [phone]   Fax:  [phone]  Name: SINEAD HOCKMAN MRN: [id number] Date of Birth: 1946/03/09

## 2018-04-16 ENCOUNTER — Encounter: Payer: PPO | Admitting: Physical Therapy

## 2018-04-19 ENCOUNTER — Ambulatory Visit: Payer: PPO | Admitting: Physical Therapy

## 2018-04-19 ENCOUNTER — Encounter: Payer: Self-pay | Admitting: Physical Therapy

## 2018-04-19 DIAGNOSIS — R293 Abnormal posture: Secondary | ICD-10-CM

## 2018-04-19 DIAGNOSIS — M62838 Other muscle spasm: Secondary | ICD-10-CM

## 2018-04-19 DIAGNOSIS — M6281 Muscle weakness (generalized): Secondary | ICD-10-CM | POA: Diagnosis not present

## 2018-04-19 NOTE — Therapy (Signed)
[facility] [address] [place], [place], [address] Phone: [phone]   Fax:  [phone]  Physical Therapy Treatment  Patient Details  Name: Catherine [name] MRN: [id number] Date of Birth: Oct 04, 1945 Referring Provider: Jonathon Jordan, MD   Encounter Date: 04/19/2018  PT End of Session - 04/19/18 1230    Visit Number  5    Date for PT Re-Evaluation  05/26/18    Authorization Type  medicare    PT Start Time  1228    PT Stop Time  1308    PT Time Calculation (min)  40 min    Activity Tolerance  Patient tolerated treatment well    Behavior During Therapy  [facility] for tasks assessed/performed       Past Medical History:  Diagnosis Date  . Anxiety   . Arthritis    HANDS  . Chronic constipation   . COPD (chronic obstructive pulmonary disease) ([place])   . DDD (degenerative disc disease), lumbar    L2-3/3-4/4-5  . Dysuria   . Frequency of urination   . GERD (gastroesophageal reflux disease)   . Hypertension   . Migraine   . Osteopenia 2019   TO OSTEOPOROSIS   . Pelvic pain in female   . Smokers' cough ([place])   . Urgency of urination    h/o INTERSTIAL CYSTITIS...DR. HUMPHRIES/GRAPEY  . Wears glasses     Past Surgical History:  Procedure Laterality Date  . ABDOMINAL HYSTERECTOMY  1982   W/  UNILATERAL SALPINGOOPHORECTOMY  . CHOLECYSTECTOMY OPEN  1987   W/  APPENDECTOMY  . COLONOSCOPY  2002  &  12-2012 & 03/19/15  . CYSTO WITH HYDRODISTENSION N/A 02/20/2014   Procedure: [place] AND MARCAINE INSTILLATION;  Surgeon: Bernestine Amass, MD;  Location: [facility];  Service: Urology;  Laterality: N/A;  . LAPAROSCOPIC UNILATERAL SALPINGOOPHORECTOMY  1984  . ROTATOR CUFF REPAIR Right 2003   and BONE SPUR    There were no vitals filed for this visit.  Subjective Assessment - 04/19/18 1231    Subjective  My legs are stronger, I am working on my exercises.     Currently in Pain?  --   My RT "lump" is tender when I touch, bend or sit.    Multiple Pain Sites  No                       [facility] Adult PT Treatment/Exercise - 04/19/18 0001      Lumbar Exercises: Aerobic   Nustep  L2 x 10 min   PTA present for status      Lumbar Exercises: Supine   Clam  --   TA focus 10x, red band hip focus 15x   Bridge  10 reps;3 seconds    Other Supine Lumbar Exercises  Baby Fish yoga ex ( thoracic ext with bent elbows    2x5     Knee/Hip Exercises: Stretches   Hip Flexor Stretch  Right;2 reps;20 seconds    Other Knee/Hip Stretches  Knee to opposite shoulder 5 sec 3x, followed by hip ER hold at end range 10 sec, ossilations at end range 20sec              PT Education - 04/19/18 1528    Education Details  HEP for RT piriformis and self joint mobs for hip ER    Person(s) Educated  Patient    Methods  Explanation;Demonstration;Tactile cues;Verbal cues;Handout  Comprehension  Verbalized understanding;Returned demonstration       PT Short Term Goals - 04/19/18 1240      PT SHORT TERM GOAL #1   Title  pt will be independent with initial HEP    Time  3    Period  Weeks    Status  Achieved      PT SHORT TERM GOAL #2   Title  Pt will report 25% less pain    Time  3    Period  Weeks    Status  Achieved   30%       PT Long Term Goals - 04/19/18 1526      PT LONG TERM GOAL #2   Title  Pt will be ind with advanced HEP    Time  6    Period  Weeks    Status  On-going   currently working towards.     PT LONG TERM GOAL #3   Title  Pt will report 50% less pain    Baseline  30% improved    Period  Weeks    Status  On-going            Plan - 04/19/18 1231    Clinical Impression Statement  Reports again 30% improvement so she has met STG for pain. All STGs are now met. She continues to be independent and compliant in her HEP. She feels like the last round of exercises have been very beneficial. Today we challneged the  RT hip by stretching more into external rotation.  We will see if she can tolerate this as it may aggrevate the anterior "lump" as she calls it or it might aggrevate the hip joint. She tolerated it well in the clinic as she demonstrated it. She was additionally given a thoracic extension exercise for additional HEP that was weighbearing into her arms and upper back and well as beneficial for her posture.     Rehab Potential  Good    PT Frequency  2x / week    PT Duration  8 weeks    PT Treatment/Interventions  ADLs/Self Care Home Management;Biofeedback;Cryotherapy;Electrical Stimulation;Iontophoresis 67m/ml Dexamethasone;Moist Heat;Therapeutic activities;Therapeutic exercise;Neuromuscular re-education;Patient/family education;Manual techniques;Passive range of motion;Dry needling;Taping    PT Next Visit Plan  hip ROM and strength, posture, hip mobs and STM as needed    PT Home Exercise Plan   Access Code: [email or web address]    Consulted and Agree with Plan of Care  Patient       Patient will benefit from skilled therapeutic intervention in order to improve the following deficits and impairments:  Pain, Increased muscle spasms, Postural dysfunction, Decreased strength, Decreased range of motion, Impaired flexibility  Visit Diagnosis: Muscle weakness (generalized)  Other muscle spasm  Abnormal posture     Problem List Patient Active Problem List   Diagnosis Date Noted  . Anxiety   . Urgency of urination   . DDD (degenerative disc disease), lumbar   . Osteopenia 08/04/2017  . Right sided sciatica 02/18/2017  . Piriformis muscle pain 02/18/2017  . Right groin pain 11/04/2016  . Neuropathy 11/04/2016  . Numbness 11/04/2016  . Depression with anxiety 11/04/2016  . Atypical chest pain 05/08/2014    Forrest Jaroszewski, PTA 04/19/2018, 3:29 PM  [facility] [address] [place] [address]: [phone]  Fax:   [phone] Name: Catherine DUCEMRN: [id number] of Birth: [birth year]

## 2018-04-23 ENCOUNTER — Encounter: Payer: PPO | Admitting: Physical Therapy

## 2018-04-27 ENCOUNTER — Encounter: Payer: Self-pay | Admitting: Physical Therapy

## 2018-04-27 ENCOUNTER — Ambulatory Visit: Payer: PPO | Admitting: Physical Therapy

## 2018-04-27 DIAGNOSIS — R293 Abnormal posture: Secondary | ICD-10-CM

## 2018-04-27 DIAGNOSIS — M6281 Muscle weakness (generalized): Secondary | ICD-10-CM | POA: Diagnosis not present

## 2018-04-27 DIAGNOSIS — M62838 Other muscle spasm: Secondary | ICD-10-CM

## 2018-04-27 NOTE — Therapy (Signed)
[facility] [address] [place], [place], [address] Phone: [phone]   Fax:  [phone]  Physical Therapy Treatment  Patient Details  Name: Catherine [name] MRN: [id number] Date of Birth: 07-21-1946 Referring Provider: Jonathon Jordan, MD   Encounter Date: 04/27/2018  PT End of Session - 04/27/18 1155    Visit Number  6    Date for PT Re-Evaluation  05/26/18    Authorization Type  medicare    PT Start Time  1147    PT Stop Time  1228    PT Time Calculation (min)  41 min    Activity Tolerance  Patient tolerated treatment well    Behavior During Therapy  [facility] for tasks assessed/performed       Past Medical History:  Diagnosis Date  . Anxiety   . Arthritis    HANDS  . Chronic constipation   . COPD (chronic obstructive pulmonary disease) ([place])   . DDD (degenerative disc disease), lumbar    L2-3/3-4/4-5  . Dysuria   . Frequency of urination   . GERD (gastroesophageal reflux disease)   . Hypertension   . Migraine   . Osteopenia 2019   TO OSTEOPOROSIS   . Pelvic pain in female   . Smokers' cough ([place])   . Urgency of urination    h/o INTERSTIAL CYSTITIS...DR. HUMPHRIES/GRAPEY  . Wears glasses     Past Surgical History:  Procedure Laterality Date  . ABDOMINAL HYSTERECTOMY  1982   W/  UNILATERAL SALPINGOOPHORECTOMY  . CHOLECYSTECTOMY OPEN  1987   W/  APPENDECTOMY  . COLONOSCOPY  2002  &  12-2012 & 03/19/15  . CYSTO WITH HYDRODISTENSION N/A 02/20/2014   Procedure: [place] AND MARCAINE INSTILLATION;  Surgeon: Bernestine Amass, MD;  Location: [facility];  Service: Urology;  Laterality: N/A;  . LAPAROSCOPIC UNILATERAL SALPINGOOPHORECTOMY  1984  . ROTATOR CUFF REPAIR Right 2003   and BONE SPUR    There were no vitals filed for this visit.  Subjective Assessment - 04/27/18 1153    Subjective  Just a little tenderness in the groin.  Worse in the morning  until I get going and stretching.  States there is no pain unless sitting or driving.      Patient Stated Goals  be able to do the exercises    Currently in Pain?  No/denies                       [facility] Adult PT Treatment/Exercise - 04/27/18 0001      Exercises   Exercises  Lumbar;Knee/Hip      Lumbar Exercises: Stretches   Single Knee to Chest Stretch  3 reps;20 seconds    Hip Flexor Stretch  Right;Left;3 reps;10 seconds    Other Lumbar Stretch Exercise  hip IR stretch      Lumbar Exercises: Aerobic   Nustep  L2 x 8 min   PT present for status      Lumbar Exercises: Seated   Hip Flexion on Ball  Strengthening;Right;Left;20 reps   on mat with 2 sec hold; engage TrA     Lumbar Exercises: Supine   Heel Slides  20 reps;Limitations    Heel Slides Limitations  cues to engage core and press down with opposite LE    Bridge  10 reps;3 seconds   yellow band around knees   Straight Leg Raise  10 reps   began to feel it in  low back   Large Ball Oblique Isometric  20 reps;3 seconds   LE roll out red ball   Other Supine Lumbar Exercises  bent knee drop outs               PT Short Term Goals - 04/19/18 1240      PT SHORT TERM GOAL #1   Title  pt will be independent with initial HEP    Time  3    Period  Weeks    Status  Achieved      PT SHORT TERM GOAL #2   Title  Pt will report 25% less pain    Time  3    Period  Weeks    Status  Achieved   30%       PT Long Term Goals - 04/19/18 1526      PT LONG TERM GOAL #2   Title  Pt will be ind with advanced HEP    Time  6    Period  Weeks    Status  On-going   currently working towards.     PT LONG TERM GOAL #3   Title  Pt will report 50% less pain    Baseline  30% improved    Period  Weeks    Status  On-going            Plan - 04/27/18 1228    Clinical Impression Statement  Pt is doing well with exercises and only needing minimal cues to perform with correct technique.  She is mostly pain  free as of today.  Pt will benefit from skilled PT for most likely one more visit to ensure good transition to HEP.    PT Treatment/Interventions  ADLs/Self Care Home Management;Biofeedback;Cryotherapy;Electrical Stimulation;Iontophoresis 4mg /ml Dexamethasone;Moist Heat;Therapeutic activities;Therapeutic exercise;Neuromuscular re-education;Patient/family education;Manual techniques;Passive range of motion;Dry needling;Taping    PT Next Visit Plan  re-eval    PT Home Exercise Plan   Access Code: [email or web address]     Consulted and Agree with Plan of Care  Patient       Patient will benefit from skilled therapeutic intervention in order to improve the following deficits and impairments:  Pain, Increased muscle spasms, Postural dysfunction, Decreased strength, Decreased range of motion, Impaired flexibility  Visit Diagnosis: Muscle weakness (generalized)  Other muscle spasm  Abnormal posture     Problem List Patient Active Problem List   Diagnosis Date Noted  . Anxiety   . Urgency of urination   . DDD (degenerative disc disease), lumbar   . Osteopenia 08/04/2017  . Right sided sciatica 02/18/2017  . Piriformis muscle pain 02/18/2017  . Right groin pain 11/04/2016  . Neuropathy 11/04/2016  . Numbness 11/04/2016  . Depression with anxiety 11/04/2016  . Atypical chest pain 05/08/2014    Zannie Cove, PT 04/27/2018, 12:30 PM  [facility] [address] [place], [place], [address] Phone: [phone]   Fax:  [phone]  Name: Catherine [name] MRN: [id number] Date of Birth: Jan 14, 1946

## 2018-04-29 ENCOUNTER — Encounter: Payer: PPO | Admitting: Physical Therapy

## 2018-05-03 ENCOUNTER — Ambulatory Visit: Payer: PPO | Admitting: Physical Therapy

## 2018-05-03 DIAGNOSIS — M6281 Muscle weakness (generalized): Secondary | ICD-10-CM

## 2018-05-03 DIAGNOSIS — M62838 Other muscle spasm: Secondary | ICD-10-CM

## 2018-05-03 DIAGNOSIS — R293 Abnormal posture: Secondary | ICD-10-CM

## 2018-05-03 NOTE — Therapy (Addendum)
[facility] [address] [place], [place], [address] Phone: [phone]   Fax:  [phone]  Physical Therapy Treatment  Patient Details  Name: Catherine [name] MRN: [id number] Date of Birth: 17-May-1946 Referring Provider (PT): Jonathon Jordan, MD   Encounter Date: 05/03/2018  PT End of Session - 05/03/18 1236    Visit Number  7    Date for PT Re-Evaluation  05/26/18    Authorization Type  medicare    PT Start Time  1233    PT Stop Time  1311    PT Time Calculation (min)  38 min    Activity Tolerance  Patient tolerated treatment well    Behavior During Therapy  [facility] for tasks assessed/performed       Past Medical History:  Diagnosis Date  . Anxiety   . Arthritis    HANDS  . Chronic constipation   . COPD (chronic obstructive pulmonary disease) ([employer])   . DDD (degenerative disc disease), lumbar    L2-3/3-4/4-5  . Dysuria   . Frequency of urination   . GERD (gastroesophageal reflux disease)   . Hypertension   . Migraine   . Osteopenia 2019   TO OSTEOPOROSIS   . Pelvic pain in female   . Smokers' cough ([place])   . Urgency of urination    h/o INTERSTIAL CYSTITIS...DR. HUMPHRIES/GRAPEY  . Wears glasses     Past Surgical History:  Procedure Laterality Date  . ABDOMINAL HYSTERECTOMY  1982   W/  UNILATERAL SALPINGOOPHORECTOMY  . CHOLECYSTECTOMY OPEN  1987   W/  APPENDECTOMY  . COLONOSCOPY  2002  &  12-2012 & 03/19/15  . CYSTO WITH HYDRODISTENSION N/A 02/20/2014   Procedure: [place] AND MARCAINE INSTILLATION;  Surgeon: Bernestine Amass, MD;  Location[facility];  Service: Urology;  Laterality: N/A;  . LAPAROSCOPIC UNILATERAL SALPINGOOPHORECTOMY  1984  . ROTATOR CUFF REPAIR Right 2003   and BONE SPUR    There were no vitals filed for this visit.  Subjective Assessment - 05/03/18 1244    Subjective  I still have just that one little spot.  I still can't sit  or drive very long.  I have overall 80% less pain    Patient Stated Goals  be able to do the exercises    Currently in Pain?  No/denies         [facility] PT Assessment - 05/03/18 0001      Observation/Other Assessments   Focus on Therapeutic Outcomes (FOTO)   30% limited      Strength   Overall Strength Comments  Rt hip ER 5/5; Rt hip flexion 4/5 (no pain, feel it in the muscle)                   [facility] Adult PT Treatment/Exercise - 05/03/18 0001      Exercises   Exercises  Lumbar;Knee/Hip      Lumbar Exercises: Stretches   Figure 4 Stretch  2 reps;30 seconds    Other Lumbar Stretch Exercise  hip IR stretch      Lumbar Exercises: Aerobic   Nustep  L2 x 8 min   PT present for status      Lumbar Exercises: Supine   Heel Slides  20 reps;Limitations    Heel Slides Limitations  cues to engage core and press down with opposite LE    Bridge  10 reps;3 seconds   yellow band around knees   Large  Ball Oblique Isometric  20 reps;3 seconds   LE roll out red ball   Other Supine Lumbar Exercises  lower trunk rotation yellow band             PT Education - 05/03/18 1314    Education Details   Access Code: [email or web address]     Person(s) Educated  Patient    Methods  Explanation;Demonstration;Handout;Verbal cues    Comprehension  Verbalized understanding;Returned demonstration       PT Short Term Goals - 04/19/18 1240      PT SHORT TERM GOAL #1   Title  pt will be independent with initial HEP    Time  3    Period  Weeks    Status  Achieved      PT SHORT TERM GOAL #2   Title  Pt will report 25% less pain    Time  3    Period  Weeks    Status  Achieved   30%       PT Long Term Goals - 05/03/18 1240      PT LONG TERM GOAL #1   Title  Pt will demonstrate improved MMT of right hip flexion and ER to at least 4/5 without pain for improved gait and functional mechanics    Status  Achieved      PT LONG TERM GOAL #2   Title  Pt will be ind with advanced HEP     Status  Achieved      PT LONG TERM GOAL #3   Title  Pt will report 50% less pain    Baseline  80% less pain, just one little lump in the thigh    Time  6    Period  Weeks    Status  Achieved      PT LONG TERM GOAL #4   Title  pt will demonstrate 5 x sit to stand in 14 sec or less    Baseline  14 sec with min UE support    Status  Achieved            Plan - 05/03/18 1308    Clinical Impression Statement  Pt is doing much better and feels 80% improvement.  She has met goals as stated above.  She will discharge with HEP today    PT Treatment/Interventions  ADLs/Self Care Home Management;Biofeedback;Cryotherapy;Electrical Stimulation;Iontophoresis 50m/ml Dexamethasone;Moist Heat;Therapeutic activities;Therapeutic exercise;Neuromuscular re-education;Patient/family education;Manual techniques;Passive range of motion;Dry needling;Taping    PT Next Visit Plan  d/c    PT Home Exercise Plan   Access Code: [email or web address]    Consulted and Agree with Plan of Care  Patient       Patient will benefit from skilled therapeutic intervention in order to improve the following deficits and impairments:  Pain, Increased muscle spasms, Postural dysfunction, Decreased strength, Decreased range of motion, Impaired flexibility  Visit Diagnosis: Muscle weakness (generalized)  Other muscle spasm  Abnormal posture     Problem List Patient Active Problem List   Diagnosis Date Noted  . Anxiety   . Urgency of urination   . DDD (degenerative disc disease), lumbar   . Osteopenia 08/04/2017  . Right sided sciatica 02/18/2017  . Piriformis muscle pain 02/18/2017  . Right groin pain 11/04/2016  . Neuropathy 11/04/2016  . Numbness 11/04/2016  . Depression with anxiety 11/04/2016  . Atypical chest pain 05/08/2014    JZannie Cove PT 05/03/2018, 1:15 PM  [facility] [address]  [place], [place], [address] Phone: [phone]   Fax:   [phone]  Name: NYASHA RAHILLY MRN: [id number] Date of Birth: September 27, 1945  PHYSICAL THERAPY DISCHARGE SUMMARY  Visits from Start of Care: 7  Current functional level related to goals / functional outcomes: See above goals   Remaining deficits: See above   Education / Equipment: HEP  Plan: Patient agrees to discharge.  Patient goals were met. Patient is being discharged due to meeting the stated rehab goals.  ?????     [employer], PT 05/03/18 1:58 PM

## 2018-05-03 NOTE — Patient Instructions (Signed)
Access Code: [email or web address]  URL: [email or web address]  Date: 05/03/2018  Prepared by: Lovett Calender   Exercises  Seated Hamstring Stretch - 3 reps - 1 sets - 30 sec hold - 1x daily - 7x weekly  Supine Figure 4 Piriformis Stretch - 3 reps - 1 sets - 30 sec hold - 1x daily - 7x weekly  Bent Knee Fallouts - 3 reps - 1 sets - 30 sec hold - 1x daily - 7x weekly  Supine Butterfly Groin Stretch - 3 reps - 1 sets - 30 sec hold - 1x daily - 7x weekly  Hooklying Single Knee to Chest Stretch - 5 reps - 1 sets - 20 sec hold - 1x daily - 7x weekly  Hooklying Isometric Clamshell - 10 reps - 2 sets - 1x daily - 7x weekly  Supine Piriformis Stretch with Leg Straight - 3 reps - 10 hold - 2x daily - 7x weekly  Supine Bilateral Hip Internal Rotation Stretch - 3 reps - 1 sets - 30 sec hold - 1x daily - 7x weekly  Supine Bridge with Resistance Band - 15 reps - 1 sets - 3 sec hold - 1x daily - 7x weekly  Supine Lower Trunk Rotation with Swiss Ball - 10 reps - 3 sets - 1x daily - 7x weekly  Supine Knees to Chest with Swiss Ball - 10 reps - 3 sets - 1x daily - 7x weekly

## 2018-05-06 DIAGNOSIS — H60531 Acute contact otitis externa, right ear: Secondary | ICD-10-CM | POA: Diagnosis not present

## 2018-05-06 DIAGNOSIS — N898 Other specified noninflammatory disorders of vagina: Secondary | ICD-10-CM | POA: Diagnosis not present

## 2018-05-06 DIAGNOSIS — M810 Age-related osteoporosis without current pathological fracture: Secondary | ICD-10-CM | POA: Diagnosis not present

## 2018-05-06 DIAGNOSIS — J019 Acute sinusitis, unspecified: Secondary | ICD-10-CM | POA: Diagnosis not present

## 2018-05-06 DIAGNOSIS — Z23 Encounter for immunization: Secondary | ICD-10-CM | POA: Diagnosis not present

## 2018-05-06 DIAGNOSIS — F322 Major depressive disorder, single episode, severe without psychotic features: Secondary | ICD-10-CM | POA: Diagnosis not present

## 2018-05-21 DIAGNOSIS — J0100 Acute maxillary sinusitis, unspecified: Secondary | ICD-10-CM | POA: Diagnosis not present

## 2018-05-31 ENCOUNTER — Other Ambulatory Visit: Payer: Self-pay | Admitting: Surgery

## 2018-05-31 ENCOUNTER — Ambulatory Visit
Admission: RE | Admit: 2018-05-31 | Discharge: 2018-05-31 | Disposition: A | Discharge status: Home / Self Care | Payer: PPO | Source: Ambulatory Visit | Attending: Surgery | Admitting: Surgery

## 2018-05-31 DIAGNOSIS — R1031 Right lower quadrant pain: Principal | ICD-10-CM

## 2018-05-31 DIAGNOSIS — G8929 Other chronic pain: Secondary | ICD-10-CM

## 2018-05-31 DIAGNOSIS — R1030 Lower abdominal pain, unspecified: Secondary | ICD-10-CM | POA: Diagnosis not present

## 2018-05-31 MED ORDER — IOPAMIDOL (ISOVUE-300) INJECTION 61%
100.0000 mL | Freq: Once | INTRAVENOUS | Status: AC | PRN
Start: 2018-05-31 — End: 2018-05-31
  Administered 2018-05-31: 100 mL via INTRAVENOUS

## 2018-06-01 DIAGNOSIS — G8929 Other chronic pain: Secondary | ICD-10-CM | POA: Diagnosis not present

## 2018-06-01 DIAGNOSIS — M7139 Other bursal cyst, multiple sites: Secondary | ICD-10-CM | POA: Diagnosis not present

## 2018-06-01 DIAGNOSIS — M6749 Ganglion, multiple sites: Secondary | ICD-10-CM | POA: Diagnosis not present

## 2018-06-01 DIAGNOSIS — M6789 Other specified disorders of synovium and tendon, multiple sites: Secondary | ICD-10-CM | POA: Diagnosis not present

## 2018-06-01 DIAGNOSIS — R1031 Right lower quadrant pain: Secondary | ICD-10-CM | POA: Diagnosis not present

## 2018-06-14 ENCOUNTER — Ambulatory Visit: Payer: Self-pay | Admitting: Surgery

## 2018-06-14 DIAGNOSIS — G8929 Other chronic pain: Secondary | ICD-10-CM | POA: Diagnosis not present

## 2018-06-14 DIAGNOSIS — M6789 Other specified disorders of synovium and tendon, multiple sites: Secondary | ICD-10-CM | POA: Diagnosis not present

## 2018-06-14 DIAGNOSIS — M6749 Ganglion, multiple sites: Secondary | ICD-10-CM | POA: Diagnosis not present

## 2018-06-14 DIAGNOSIS — R1031 Right lower quadrant pain: Secondary | ICD-10-CM | POA: Diagnosis not present

## 2018-06-14 DIAGNOSIS — M7139 Other bursal cyst, multiple sites: Secondary | ICD-10-CM | POA: Diagnosis not present

## 2018-06-14 NOTE — H&P (View-Only) (Signed)
[name] Documented: 06/14/2018 2:21 PM Location: [facility] Patient #: [id number] DOB: Jul 07, 1946 Married / Language: [name] / Race: White Female  History of Present Illness [name] MD; 06/14/2018 2:43 PM) The patient is a 72 year old female who presents for a hernia surgery post-op. Note for "Hernia surgery post-op": ` ` ` The patient returns s/p laparoscopic repair of left inguinal and bilateral femoral hernias with mesh. Right iliac lymph node dissection with excision of iliofemoral canal cystic mass. 01/29/2018  Pathology of lymph nodes consistent with reactive lymph nodes.  Pathology of iliofemoral canal cystic mass consistent with ganglion cyst   The returns to clinic after surgery, disheartened.  She notes that her groin pain and swelling went away. She had three great months where she was back to normal. Being active. Exercising in the gym. Doing well. However she felt a new lump has formed in her inner right groin close to her inner thigh. She began quickly to alternate between Aleve and Tylenol to control the pain. Trying to avoid narcotics. no fevers or chills or sweats. No nausea or vomiting. Moving her bowels weekly. Remains abstinent on smoking. Evaluated and aspirated the fluid. It is come back. She is concerned that the cyst will not go away without surgery. `   ` `   Problem List/Past Medical [name], MD; 06/14/2018 2:27 PM) ENLARGED LYMPH NODE (R59.9) CHRONIC CONSTIPATION (K59.09) EXTERNAL HEMORRHOIDS WITH COMPLICATION (Q59.5) TOBACCO ABUSE (Z72.0) POSTOPERATIVE SEROMA OF SKIN AFTER NON-DERMATOLOGIC PROCEDURE (L76.34) LYMPHANGIOMYOMA (D18.1) VISIT FOR WOUND CHECK (Z51.89) CHANGE OR REMOVAL OF DRAINS (Z48.03) GROIN PAIN, CHRONIC, RIGHT (R10.31) RIGHT INGUINAL HERNIA (K40.90) PREOP - ING HERNIA - ENCOUNTER FOR PREOPERATIVE EXAMINATION FOR GENERAL SURGICAL PROCEDURE (Z01.818) OTHER GANGLION AND CYST  OF SYNOVIUM, TENDON, AND BURSA (M67.49, M67.89,M71.39) BILATERAL FEMORAL HERNIA WITHOUT OBSTRUCTION OR GANGRENE, RECURRENCE NOT SPECIFIED (K41.20)  Allergies (Tanisha A. Owens Shark, RMA; 06/14/2018 2:22 PM) Penicillin G Benzathine & Proc *PENICILLINS* Rash Sulfabenzamide *CHEMICALS* rash Allergies Reconciled  Medication History (Tanisha A. Owens Shark, [place]; 06/14/2018 2:22 PM) Calcium Carbonate (Oral) Specific strength unknown - Active. Vitamin B-12 (5000MCG Tablet Disint, Oral) Active. Cetirizine HCl (5MG  Tablet, Oral) Active. Flonase (50MCG/ACT Suspension, Nasal) Active. Ibuprofen (Oral) Specific strength unknown - Active. LORazepam (1MG  Tablet, Oral) Active. DiltiaZEM HCl ER Beads (360MG  Capsule ER 24HR, Oral) Active. Symbicort (160-4.5MCG/ACT Aerosol, Inhalation) Active. Albuterol Sulfate ((2.5 MG/3ML)0.083% Nebulized Soln, Inhalation) Active. Sucralfate (1GM Tablet, Oral) Active. NexIUM (40MG  Capsule DR, Oral) Active. Vitamin D (Cholecalciferol) (1000UNIT Capsule, Oral) Active. Medications Reconciled    Vitals (Tanisha A. Brown RMA; 06/14/2018 2:22 PM) 06/14/2018 2:21 PM Weight: 160.8 lb Height: 70in Body Surface Area: 1.9 m Body Mass Index: 23.07 kg/m  Temp.: 98.22F  Pulse: 106 (Regular)  BP: 128/78 (Sitting, Left Arm, Standard)      Physical Exam [name] MD; 06/14/2018 2:42 PM)  General Mental Status-Alert. General Appearance-Not in acute distress. Voice-Normal. Note: Relaxed. Nontoxic.  Integumentary Global Assessment Normal Exam - Distribution of scalp and body hair is normal. General Characteristics Overall Skin Surface - no rashes and no suspicious lesions.  Head and Neck Head-normocephalic, atraumatic with no lesions or palpable masses. Face Global Assessment - atraumatic, no absence of expression. Neck Global Assessment - no abnormal movements, no decreased range of motion. Trachea-midline. Thyroid Gland  Characteristics - non-tender.  Eye Eyeball - Left-Extraocular movements intact, No Nystagmus. Eyeball - Right-Extraocular movements intact, No Nystagmus. Upper Eyelid - Left-No Cyanotic. Upper Eyelid - Right-No Cyanotic.  Chest and Lung Exam  Inspection Accessory muscles - No use of accessory muscles in breathing.  Abdomen Note: Incisions with normal healing ridges. No active bleeding. No cellulitis. No guarding/rebound tenderness  Female Genitourinary Note: ` ` 25 x 15 mm ellipsoid mostly mobile mass on right lateral mons pubis along inguinal crease towards the inner thigh. Sensitive.  No pain along inguinal or femoral canals.No abscess. No recurrent hernia. No recurrent swelling in mid our lateral inguinal region. This is a new location. Left groin without hernia mass or tenderness. No lymphadenopathy felt.  Peripheral Vascular Upper Extremity Inspection - Left - Not Gangrenous, No Petechiae. Right - Not Gangrenous, No Petechiae.  Neurologic Neurologic evaluation reveals -normal attention span and ability to concentrate, able to name objects and repeat phrases. Appropriate fund of knowledge and normal coordination.  Neuropsychiatric Mental status exam performed with findings of-able to articulate well with normal speech/language, rate, volume and coherence and no evidence of hallucinations, delusions, obsessions or homicidal/suicidal ideation. Orientation-oriented X3.  Musculoskeletal Global Assessment Gait and Station - normal gait and station.  Lymphatic General Lymphatics Description - No Generalized lymphadenopathy.    Assessment & Plan [name] MD; 06/14/2018 2:42 PM)  OTHER GANGLION AND CYST OF SYNOVIUM, TENDON, AND BURSA (M67.49) Impression: Status post excision of persistent versus recurrent cystic mass densely adherent to the pelvic side of the iliofemoral canal. Pathology consistent with ganglion cyst and not lymphocele like  the thigh excision was. .  Recurrent subcutaneous mass and in the right groin between mons pubis and inner thigh. This correlates with the CAT scan. Aspiration done last visit of syrupy consistency, concerning for recurrent ganglion cyst. Mass has recurred again. This time mobile and superficial.  She is frustrated to get a new mass but does feel like the other 2 areas healed up fine. She is hopeful that if we can excise this, we will be done.  Current Plans You are being scheduled for surgery- Our schedulers will call you.  You should hear from our office's scheduling department within 5 working days about the location, date, and time of surgery. We try to make accommodations for patient's preferences in scheduling surgery, but sometimes the OR schedule or the surgeon's schedule prevents [place] from making those accommodations.  If you have not heard from our office [phone]) in 5 working days, call the office and ask for your surgeon's nurse.  If you have other questions about your diagnosis, plan, or surgery, call the office and ask for your surgeon's nurse.  The pathophysiology of skin & subcutaneous masses was discussed. Natural history risks without surgery were discussed. I recommended surgery to remove the mass. I explained the technique of removal with use of local anesthesia & possible need for more aggressive sedation/anesthesia for patient comfort.  Risks such as bleeding, infection, wound breakdown, heart attack, death, and other risks were discussed. I noted a good likelihood this will help address the problem. Possibility that this will not correct all symptoms was explained. Possibility of regrowth/recurrence of the mass was discussed. We will work to minimize complications. Questions were answered. The patient expresses understanding & wishes to proceed with surgery.   GROIN PAIN, CHRONIC, RIGHT (R10.31) Impression: I'm glad that this is markedly improved for the  past several months. Disappointed that its come back this past month. Seems to correlate with the seroma, possible recurrent synovial cyst. Thereafter hopefully this will not come back after aspiration. We will see.  Continue Aleve/Tylenol combination that seems to work for her.  Warm soaks and  stretching.  Continue physical therapy to help with groin stretches. We had given her a handout to see if that helps. She would like to run a by her primary care physician. He is feeling already much better in the past few weeks, so hopefully in the need for this is less. Backup plan.  Current Plans Pt Education - [facility] ([place])  [name], MD, FACS, MASCRS Gastrointestinal and Minimally Invasive Surgery    [address] [place], [place] [address] [phone] Main / Paging [phone] Fax

## 2018-06-14 NOTE — H&P (Signed)
[name] Documented: 06/14/2018 2:21 PM Location: [facility] Patient #: [id number] DOB: 02/01/1946 Married / Language: [name] / Race: White Female  History of Present Illness [name] MD; 06/14/2018 2:43 PM) The patient is a 72 year old female who presents for a hernia surgery post-op. Note for "Hernia surgery post-op": ` ` ` The patient returns s/p laparoscopic repair of left inguinal and bilateral femoral hernias with mesh. Right iliac lymph node dissection with excision of iliofemoral canal cystic mass. 01/29/2018  Pathology of lymph nodes consistent with reactive lymph nodes.  Pathology of iliofemoral canal cystic mass consistent with ganglion cyst   The returns to clinic after surgery, disheartened.  She notes that her groin pain and swelling went away. She had three great months where she was back to normal. Being active. Exercising in the gym. Doing well. However she felt a new lump has formed in her inner right groin close to her inner thigh. She began quickly to alternate between Aleve and Tylenol to control the pain. Trying to avoid narcotics. no fevers or chills or sweats. No nausea or vomiting. Moving her bowels weekly. Remains abstinent on smoking. Evaluated and aspirated the fluid. It is come back. She is concerned that the cyst will not go away without surgery. `   ` `   Problem List/Past Medical [name], MD; 06/14/2018 2:27 PM) ENLARGED LYMPH NODE (R59.9) CHRONIC CONSTIPATION (K59.09) EXTERNAL HEMORRHOIDS WITH COMPLICATION (J18.8) TOBACCO ABUSE (Z72.0) POSTOPERATIVE SEROMA OF SKIN AFTER NON-DERMATOLOGIC PROCEDURE (L76.34) LYMPHANGIOMYOMA (D18.1) VISIT FOR WOUND CHECK (Z51.89) CHANGE OR REMOVAL OF DRAINS (Z48.03) GROIN PAIN, CHRONIC, RIGHT (R10.31) RIGHT INGUINAL HERNIA (K40.90) PREOP - ING HERNIA - ENCOUNTER FOR PREOPERATIVE EXAMINATION FOR GENERAL SURGICAL PROCEDURE (Z01.818) OTHER GANGLION AND CYST  OF SYNOVIUM, TENDON, AND BURSA (M67.49, M67.89,M71.39) BILATERAL FEMORAL HERNIA WITHOUT OBSTRUCTION OR GANGRENE, RECURRENCE NOT SPECIFIED (K41.20)  Allergies (Tanisha A. Owens Shark, RMA; 06/14/2018 2:22 PM) Penicillin G Benzathine & Proc *PENICILLINS* Rash Sulfabenzamide *CHEMICALS* rash Allergies Reconciled  Medication History (Tanisha A. Owens Shark, [place]; 06/14/2018 2:22 PM) Calcium Carbonate (Oral) Specific strength unknown - Active. Vitamin B-12 (5000MCG Tablet Disint, Oral) Active. Cetirizine HCl (5MG  Tablet, Oral) Active. Flonase (50MCG/ACT Suspension, Nasal) Active. Ibuprofen (Oral) Specific strength unknown - Active. LORazepam (1MG  Tablet, Oral) Active. DiltiaZEM HCl ER Beads (360MG  Capsule ER 24HR, Oral) Active. Symbicort (160-4.5MCG/ACT Aerosol, Inhalation) Active. Albuterol Sulfate ((2.5 MG/3ML)0.083% Nebulized Soln, Inhalation) Active. Sucralfate (1GM Tablet, Oral) Active. NexIUM (40MG  Capsule DR, Oral) Active. Vitamin D (Cholecalciferol) (1000UNIT Capsule, Oral) Active. Medications Reconciled    Vitals (Tanisha A. Brown RMA; 06/14/2018 2:22 PM) 06/14/2018 2:21 PM Weight: 160.8 lb Height: 70in Body Surface Area: 1.9 m Body Mass Index: 23.07 kg/m  Temp.: 98.69F  Pulse: 106 (Regular)  BP: 128/78 (Sitting, Left Arm, Standard)      Physical Exam [name] MD; 06/14/2018 2:42 PM)  General Mental Status-Alert. General Appearance-Not in acute distress. Voice-Normal. Note: Relaxed. Nontoxic.  Integumentary Global Assessment Normal Exam - Distribution of scalp and body hair is normal. General Characteristics Overall Skin Surface - no rashes and no suspicious lesions.  Head and Neck Head-normocephalic, atraumatic with no lesions or palpable masses. Face Global Assessment - atraumatic, no absence of expression. Neck Global Assessment - no abnormal movements, no decreased range of motion. Trachea-midline. Thyroid Gland  Characteristics - non-tender.  Eye Eyeball - Left-Extraocular movements intact, No Nystagmus. Eyeball - Right-Extraocular movements intact, No Nystagmus. Upper Eyelid - Left-No Cyanotic. Upper Eyelid - Right-No Cyanotic.  Chest and Lung Exam  Inspection Accessory muscles - No use of accessory muscles in breathing.  Abdomen Note: Incisions with normal healing ridges. No active bleeding. No cellulitis. No guarding/rebound tenderness  Female Genitourinary Note: ` ` 25 x 15 mm ellipsoid mostly mobile mass on right lateral mons pubis along inguinal crease towards the inner thigh. Sensitive.  No pain along inguinal or femoral canals.No abscess. No recurrent hernia. No recurrent swelling in mid our lateral inguinal region. This is a new location. Left groin without hernia mass or tenderness. No lymphadenopathy felt.  Peripheral Vascular Upper Extremity Inspection - Left - Not Gangrenous, No Petechiae. Right - Not Gangrenous, No Petechiae.  Neurologic Neurologic evaluation reveals -normal attention span and ability to concentrate, able to name objects and repeat phrases. Appropriate fund of knowledge and normal coordination.  Neuropsychiatric Mental status exam performed with findings of-able to articulate well with normal speech/language, rate, volume and coherence and no evidence of hallucinations, delusions, obsessions or homicidal/suicidal ideation. Orientation-oriented X3.  Musculoskeletal Global Assessment Gait and Station - normal gait and station.  Lymphatic General Lymphatics Description - No Generalized lymphadenopathy.    Assessment & Plan [name] MD; 06/14/2018 2:42 PM)  OTHER GANGLION AND CYST OF SYNOVIUM, TENDON, AND BURSA (M67.49) Impression: Status post excision of persistent versus recurrent cystic mass densely adherent to the pelvic side of the iliofemoral canal. Pathology consistent with ganglion cyst and not lymphocele like  the thigh excision was. .  Recurrent subcutaneous mass and in the right groin between mons pubis and inner thigh. This correlates with the CAT scan. Aspiration done last visit of syrupy consistency, concerning for recurrent ganglion cyst. Mass has recurred again. This time mobile and superficial.  She is frustrated to get a new mass but does feel like the other 2 areas healed up fine. She is hopeful that if we can excise this, we will be done.  Current Plans You are being scheduled for surgery- Our schedulers will call you.  You should hear from our office's scheduling department within 5 working days about the location, date, and time of surgery. We try to make accommodations for patient's preferences in scheduling surgery, but sometimes the OR schedule or the surgeon's schedule prevents [place] from making those accommodations.  If you have not heard from our office [phone]) in 5 working days, call the office and ask for your surgeon's nurse.  If you have other questions about your diagnosis, plan, or surgery, call the office and ask for your surgeon's nurse.  The pathophysiology of skin & subcutaneous masses was discussed. Natural history risks without surgery were discussed. I recommended surgery to remove the mass. I explained the technique of removal with use of local anesthesia & possible need for more aggressive sedation/anesthesia for patient comfort.  Risks such as bleeding, infection, wound breakdown, heart attack, death, and other risks were discussed. I noted a good likelihood this will help address the problem. Possibility that this will not correct all symptoms was explained. Possibility of regrowth/recurrence of the mass was discussed. We will work to minimize complications. Questions were answered. The patient expresses understanding & wishes to proceed with surgery.   GROIN PAIN, CHRONIC, RIGHT (R10.31) Impression: I'm glad that this is markedly improved for the  past several months. Disappointed that its come back this past month. Seems to correlate with the seroma, possible recurrent synovial cyst. Thereafter hopefully this will not come back after aspiration. We will see.  Continue Aleve/Tylenol combination that seems to work for her.  Warm soaks and  stretching.  Continue physical therapy to help with groin stretches. We had given her a handout to see if that helps. She would like to run a by her primary care physician. He is feeling already much better in the past few weeks, so hopefully in the need for this is less. Backup plan.  Current Plans Pt Education - [facility] ([facility])  [name], MD, FACS, MASCRS Gastrointestinal and Minimally Invasive Surgery    [address], [place], [place] [address] [phone] Main / Paging [phone] Fax

## 2018-06-15 ENCOUNTER — Encounter (HOSPITAL_COMMUNITY): Payer: Self-pay | Admitting: *Deleted

## 2018-06-15 ENCOUNTER — Other Ambulatory Visit: Payer: Self-pay

## 2018-06-16 ENCOUNTER — Ambulatory Visit (HOSPITAL_COMMUNITY): Payer: PPO | Admitting: Anesthesiology

## 2018-06-16 ENCOUNTER — Encounter (HOSPITAL_COMMUNITY): Payer: Self-pay

## 2018-06-16 ENCOUNTER — Encounter (HOSPITAL_COMMUNITY)
Admission: RE | Disposition: A | Discharge status: Home / Self Care | Payer: Self-pay | Source: Ambulatory Visit | Attending: Surgery

## 2018-06-16 ENCOUNTER — Ambulatory Visit (HOSPITAL_COMMUNITY)
Admission: RE | Admit: 2018-06-16 | Discharge: 2018-06-16 | Disposition: A | Discharge status: Home / Self Care | Payer: PPO | Source: Ambulatory Visit | Attending: Surgery | Admitting: Surgery

## 2018-06-16 DIAGNOSIS — Z87891 Personal history of nicotine dependence: Secondary | ICD-10-CM | POA: Insufficient documentation

## 2018-06-16 DIAGNOSIS — M79651 Pain in right thigh: Secondary | ICD-10-CM | POA: Insufficient documentation

## 2018-06-16 DIAGNOSIS — I712 Thoracic aortic aneurysm, without rupture: Secondary | ICD-10-CM | POA: Diagnosis not present

## 2018-06-16 DIAGNOSIS — L729 Follicular cyst of the skin and subcutaneous tissue, unspecified: Secondary | ICD-10-CM | POA: Diagnosis not present

## 2018-06-16 DIAGNOSIS — M7138 Other bursal cyst, other site: Secondary | ICD-10-CM | POA: Diagnosis not present

## 2018-06-16 DIAGNOSIS — F419 Anxiety disorder, unspecified: Secondary | ICD-10-CM | POA: Diagnosis not present

## 2018-06-16 DIAGNOSIS — M5136 Other intervertebral disc degeneration, lumbar region: Secondary | ICD-10-CM | POA: Insufficient documentation

## 2018-06-16 DIAGNOSIS — K5909 Other constipation: Secondary | ICD-10-CM | POA: Diagnosis not present

## 2018-06-16 DIAGNOSIS — I10 Essential (primary) hypertension: Secondary | ICD-10-CM | POA: Diagnosis not present

## 2018-06-16 DIAGNOSIS — F329 Major depressive disorder, single episode, unspecified: Secondary | ICD-10-CM | POA: Insufficient documentation

## 2018-06-16 DIAGNOSIS — Z79899 Other long term (current) drug therapy: Secondary | ICD-10-CM | POA: Insufficient documentation

## 2018-06-16 DIAGNOSIS — Z882 Allergy status to sulfonamides status: Secondary | ICD-10-CM | POA: Insufficient documentation

## 2018-06-16 DIAGNOSIS — J449 Chronic obstructive pulmonary disease, unspecified: Secondary | ICD-10-CM | POA: Insufficient documentation

## 2018-06-16 DIAGNOSIS — R1909 Other intra-abdominal and pelvic swelling, mass and lump: Secondary | ICD-10-CM | POA: Diagnosis not present

## 2018-06-16 DIAGNOSIS — K219 Gastro-esophageal reflux disease without esophagitis: Secondary | ICD-10-CM | POA: Diagnosis not present

## 2018-06-16 DIAGNOSIS — R229 Localized swelling, mass and lump, unspecified: Secondary | ICD-10-CM | POA: Insufficient documentation

## 2018-06-16 DIAGNOSIS — L02214 Cutaneous abscess of groin: Secondary | ICD-10-CM

## 2018-06-16 HISTORY — PX: MASS EXCISION: SHX2000

## 2018-06-16 LAB — CBC
HCT: 40.5 % (ref 36.0–46.0)
Hemoglobin: 13.3 g/dL (ref 12.0–15.0)
MCH: 30.9 pg (ref 26.0–34.0)
MCHC: 32.8 g/dL (ref 30.0–36.0)
MCV: 94 fL (ref 80.0–100.0)
Platelets: 206 K/uL (ref 150–400)
RBC: 4.31 MIL/uL (ref 3.87–5.11)
RDW: 12.4 % (ref 11.5–15.5)
WBC: 8.4 K/uL (ref 4.0–10.5)
nRBC: 0 % (ref 0.0–0.2)

## 2018-06-16 LAB — BASIC METABOLIC PANEL WITH GFR
Anion gap: 10 (ref 5–15)
BUN: 17 mg/dL (ref 8–23)
CO2: 23 mmol/L (ref 22–32)
Calcium: 9.2 mg/dL (ref 8.9–10.3)
Chloride: 105 mmol/L (ref 98–111)
Creatinine, Ser: 0.92 mg/dL (ref 0.44–1.00)
GFR calc Af Amer: >60 mL/min (ref >60)
GFR calc non Af Amer: >60 mL/min (ref >60)
Glucose, Bld: 111 mg/dL — ABNORMAL HIGH (ref 70–99)
Potassium: 3.8 mmol/L (ref 3.5–5.1)
Sodium: 138 mmol/L (ref 135–145)

## 2018-06-16 SURGERY — EXCISION MASS
Anesthesia: General | Site: Groin | Laterality: Right

## 2018-06-16 MED ORDER — DEXAMETHASONE SODIUM PHOSPHATE 10 MG/ML IJ SOLN
INTRAMUSCULAR | Status: AC
  Filled 2018-06-16: qty 1

## 2018-06-16 MED ORDER — OXYCODONE HCL 5 MG PO TABS: 5.0000 mg | ORAL_TABLET | Freq: Once | ORAL | Status: DC | PRN

## 2018-06-16 MED ORDER — DEXAMETHASONE SODIUM PHOSPHATE 10 MG/ML IJ SOLN
INTRAMUSCULAR | Status: DC | PRN
  Administered 2018-06-16: 10 mg via INTRAVENOUS

## 2018-06-16 MED ORDER — EPHEDRINE SULFATE-NACL 50-0.9 MG/10ML-% IV SOSY
PREFILLED_SYRINGE | INTRAVENOUS | Status: DC | PRN
  Administered 2018-06-16 (×2): 5 mg via INTRAVENOUS

## 2018-06-16 MED ORDER — FENTANYL CITRATE (PF) 100 MCG/2ML IJ SOLN
INTRAMUSCULAR | Status: AC
  Filled 2018-06-16: qty 2

## 2018-06-16 MED ORDER — LACTATED RINGERS IV SOLN
INTRAVENOUS | Status: DC
  Administered 2018-06-16: 14:00:00 via INTRAVENOUS

## 2018-06-16 MED ORDER — MIDAZOLAM HCL 5 MG/5ML IJ SOLN
INTRAMUSCULAR | Status: DC | PRN
  Administered 2018-06-16: 1 mg via INTRAVENOUS

## 2018-06-16 MED ORDER — FENTANYL CITRATE (PF) 100 MCG/2ML IJ SOLN
25.0000 ug | INTRAMUSCULAR | Status: DC | PRN
  Administered 2018-06-16 (×3): 50 ug via INTRAVENOUS

## 2018-06-16 MED ORDER — LIDOCAINE 2% (20 MG/ML) 5 ML SYRINGE
INTRAMUSCULAR | Status: AC
  Filled 2018-06-16: qty 5

## 2018-06-16 MED ORDER — ACETAMINOPHEN 500 MG PO TABS
ORAL_TABLET | ORAL | Status: AC
  Filled 2018-06-16: qty 2

## 2018-06-16 MED ORDER — CHLORHEXIDINE GLUCONATE CLOTH 2 % EX PADS: 6.0000 | MEDICATED_PAD | Freq: Once | CUTANEOUS | Status: DC

## 2018-06-16 MED ORDER — MIDAZOLAM HCL 2 MG/2ML IJ SOLN
INTRAMUSCULAR | Status: AC
  Filled 2018-06-16: qty 2

## 2018-06-16 MED ORDER — EPHEDRINE 5 MG/ML INJ
INTRAVENOUS | Status: AC
  Filled 2018-06-16: qty 10

## 2018-06-16 MED ORDER — FENTANYL CITRATE (PF) 100 MCG/2ML IJ SOLN
INTRAMUSCULAR | Status: DC | PRN
  Administered 2018-06-16 (×2): 50 ug via INTRAVENOUS

## 2018-06-16 MED ORDER — PROMETHAZINE HCL 25 MG/ML IJ SOLN: 6.2500 mg | INTRAMUSCULAR | Status: DC | PRN

## 2018-06-16 MED ORDER — ONDANSETRON HCL 4 MG/2ML IJ SOLN
INTRAMUSCULAR | Status: DC | PRN
  Administered 2018-06-16: 4 mg via INTRAVENOUS

## 2018-06-16 MED ORDER — OXYCODONE HCL 5 MG/5ML PO SOLN
5.0000 mg | Freq: Once | ORAL | Status: DC | PRN
  Filled 2018-06-16: qty 5

## 2018-06-16 MED ORDER — CLINDAMYCIN PHOSPHATE 900 MG/50ML IV SOLN
INTRAVENOUS | Status: AC
  Filled 2018-06-16: qty 50

## 2018-06-16 MED ORDER — ONDANSETRON HCL 4 MG/2ML IJ SOLN
INTRAMUSCULAR | Status: AC
  Filled 2018-06-16: qty 2

## 2018-06-16 MED ORDER — PROPOFOL 10 MG/ML IV BOLUS
INTRAVENOUS | Status: AC
  Filled 2018-06-16: qty 20

## 2018-06-16 MED ORDER — BUPIVACAINE HCL (PF) 0.25 % IJ SOLN
INTRAMUSCULAR | Status: DC | PRN
  Administered 2018-06-16: 20 mL
  Administered 2018-06-16: 10 mL

## 2018-06-16 MED ORDER — CLINDAMYCIN PHOSPHATE 900 MG/50ML IV SOLN
900.0000 mg | INTRAVENOUS | Status: AC
  Administered 2018-06-16: 900 mg via INTRAVENOUS

## 2018-06-16 MED ORDER — ACETAMINOPHEN 500 MG PO TABS
1000.0000 mg | ORAL_TABLET | ORAL | Status: AC
  Administered 2018-06-16: 1000 mg via ORAL

## 2018-06-16 MED ORDER — GABAPENTIN 300 MG PO CAPS
300.0000 mg | ORAL_CAPSULE | ORAL | Status: AC
  Administered 2018-06-16: 300 mg via ORAL

## 2018-06-16 MED ORDER — HYDROCODONE-ACETAMINOPHEN 5-325 MG PO TABS: 1.0000 | ORAL_TABLET | Freq: Four times a day (QID) | ORAL | 0 refills | Status: DC | PRN

## 2018-06-16 MED ORDER — 0.9 % SODIUM CHLORIDE (POUR BTL) OPTIME
TOPICAL | Status: DC | PRN
  Administered 2018-06-16: 1000 mL

## 2018-06-16 MED ORDER — ACETAMINOPHEN 10 MG/ML IV SOLN
1000.0000 mg | Freq: Once | INTRAVENOUS | Status: DC | PRN
  Administered 2018-06-16: 1000 mg via INTRAVENOUS

## 2018-06-16 MED ORDER — ACETAMINOPHEN 10 MG/ML IV SOLN
INTRAVENOUS | Status: AC
  Filled 2018-06-16: qty 100

## 2018-06-16 MED ORDER — SOD CITRATE-CITRIC ACID 500-334 MG/5ML PO SOLN
30.0000 mL | Freq: Once | ORAL | Status: AC
  Administered 2018-06-16: 15 mL via ORAL

## 2018-06-16 MED ORDER — BUPIVACAINE HCL (PF) 0.25 % IJ SOLN
INTRAMUSCULAR | Status: AC
  Filled 2018-06-16: qty 30

## 2018-06-16 MED ORDER — PROPOFOL 10 MG/ML IV BOLUS
INTRAVENOUS | Status: DC | PRN
  Administered 2018-06-16: 150 mg via INTRAVENOUS

## 2018-06-16 MED ORDER — KETOROLAC TROMETHAMINE 15 MG/ML IJ SOLN
INTRAMUSCULAR | Status: AC
  Administered 2018-06-16: 15 mg via INTRAVENOUS
  Filled 2018-06-16: qty 1

## 2018-06-16 MED ORDER — LIDOCAINE 2% (20 MG/ML) 5 ML SYRINGE
INTRAMUSCULAR | Status: DC | PRN
  Administered 2018-06-16: 80 mg via INTRAVENOUS

## 2018-06-16 MED ORDER — TRAMADOL HCL 50 MG PO TABS: 50.0000 mg | ORAL_TABLET | Freq: Four times a day (QID) | ORAL | 0 refills | Status: DC | PRN

## 2018-06-16 MED ORDER — GABAPENTIN 300 MG PO CAPS
ORAL_CAPSULE | ORAL | Status: AC
  Filled 2018-06-16: qty 1

## 2018-06-16 MED ORDER — FENTANYL CITRATE (PF) 100 MCG/2ML IJ SOLN
INTRAMUSCULAR | Status: AC
  Administered 2018-06-16: 50 ug via INTRAVENOUS
  Filled 2018-06-16: qty 2

## 2018-06-16 MED ORDER — SOD CITRATE-CITRIC ACID 500-334 MG/5ML PO SOLN
ORAL | Status: AC
  Filled 2018-06-16: qty 15

## 2018-06-16 MED ORDER — KETOROLAC TROMETHAMINE 15 MG/ML IJ SOLN
15.0000 mg | Freq: Once | INTRAMUSCULAR | Status: AC
  Administered 2018-06-16: 15 mg via INTRAVENOUS

## 2018-06-16 SURGICAL SUPPLY — 30 items
ADH SKN CLS APL DERMABOND .7 (GAUZE/BANDAGES/DRESSINGS) ×1
BLADE SURG 15 STRL LF DISP TIS (BLADE) ×1 IMPLANT
BLADE SURG 15 STRL SS (BLADE) ×2
BRIEF STRETCH FOR OB PAD LRG (UNDERPADS AND DIAPERS) IMPLANT
COVER SURGICAL LIGHT HANDLE (MISCELLANEOUS) ×2 IMPLANT
COVER WAND RF STERILE (DRAPES) IMPLANT
DERMABOND ADVANCED (GAUZE/BANDAGES/DRESSINGS) ×1
DERMABOND ADVANCED .7 DNX12 (GAUZE/BANDAGES/DRESSINGS) IMPLANT
DRAPE LAPAROTOMY T 102X78X121 (DRAPES) ×2 IMPLANT
DRSG PAD ABDOMINAL 8X10 ST (GAUZE/BANDAGES/DRESSINGS) IMPLANT
DRSG TEGADERM 4X4.75 (GAUZE/BANDAGES/DRESSINGS) ×1 IMPLANT
ELECT PENCIL ROCKER SW 15FT (MISCELLANEOUS) ×2 IMPLANT
ELECT REM PT RETURN 15FT ADLT (MISCELLANEOUS) ×2 IMPLANT
GAUZE 4X4 16PLY RFD (DISPOSABLE) ×2 IMPLANT
GAUZE SPONGE 4X4 12PLY STRL (GAUZE/BANDAGES/DRESSINGS) IMPLANT
GAUZE XEROFORM 2X2 STRL (GAUZE/BANDAGES/DRESSINGS) ×1 IMPLANT
GLOVE ECLIPSE 8.0 STRL XLNG CF (GLOVE) ×2 IMPLANT
GLOVE INDICATOR 8.0 STRL GRN (GLOVE) ×2 IMPLANT
GOWN STRL REUS W/TWL XL LVL3 (GOWN DISPOSABLE) ×4 IMPLANT
KIT BASIN OR (CUSTOM PROCEDURE TRAY) ×2 IMPLANT
LUBRICANT JELLY K Y 4OZ (MISCELLANEOUS) ×2 IMPLANT
NEEDLE HYPO 22GX1.5 SAFETY (NEEDLE) ×2 IMPLANT
PACK BASIC VI WITH GOWN DISP (CUSTOM PROCEDURE TRAY) ×2 IMPLANT
SUT MNCRL AB 4-0 PS2 18 (SUTURE) ×1 IMPLANT
SUT VIC AB 3-0 SH 8-18 (SUTURE) ×1 IMPLANT
SYR 20CC LL (SYRINGE) ×2 IMPLANT
SYR BULB IRRIGATION 50ML (SYRINGE) ×2 IMPLANT
TOWEL OR 17X26 10 PK STRL BLUE (TOWEL DISPOSABLE) ×2 IMPLANT
TOWEL OR NON WOVEN STRL DISP B (DISPOSABLE) ×2 IMPLANT
YANKAUER SUCT BULB TIP 10FT TU (MISCELLANEOUS) ×2 IMPLANT

## 2018-06-16 NOTE — Discharge Instructions (Signed)
DRAIN CARE:   You have a closed bulb drain to help you heal.    A bulb drain is a small, plastic reservoir which creates a gentle suction. It is used to remove excess fluid from a surgical wound. The color and amount of fluid will vary. Immediately after surgery, the fluid is bright red. It may gradually change to a yellow color. When the amount decreases to about 1 or 2 tablespoons (15 to 30 cc) per 24 hours, your caregiver will usually remove it.  JP Care  The Jackson-Pratt drainage system has flexible tubing attached to a soft, plastic bulb with a stopper. The drainage end of the tubing, which is flat and white, goes into your body through a small opening near your incision (surgical cut). A stitch holds the drainage end in place. The rest of the tube is outside your body, attached to the bulb. When the bulb is compressed with the stopper in place, it creates a vacuum. This causes a constant gentle suction, which helps draw out fluid that collects under your incision. The bulb should be compressed at all times, except when you are emptying the drainage.  How long you will have your Jackson-Pratt depends on your surgery and the amount of fluid is draining. This is different for everyone. The Jackson-Pratt is usually removed when the drainage is 30 mL or less over 24 hours. To keep track of how much drainage youre having, you will record the amount in a drainage log. Its important to bring the log with you to your follow-up appointments.  Caring for Your Jackson-Pratt at Home In order to care for your Jackson-Pratt at home, you or your caregiver will do the following:  Empty the drain once a day and record the color and amount of drainage  Care for the area where the tubing enters your skin by washing with soap and water.  Milk the tubing to help move clots into the bulb.  Do this before you empty and measure your drainage. Look in the mirror at the tubing. This will help you see where your  hands need to be. Pinch the tubing close to where it goes into your skin between your thumb and forefinger. With the thumb and forefinger of your other hand, pinch the tubing right below your other fingers. Keep your fingers pinched and slide them down the tubing, pushing any clots down toward the bulb. You may want to use alcohol swabs to help you slide your fingers down the tubing. Repeat steps 3 and 4 as necessary to push clots from the tubing into the bulb. If you are not able to move a clot into the bulb, call your doctors office. The fluid may leak around the insertion site if a clot is blocking the drainage flow. If there is fluid in the bulb and no leakage at the insertion site, the drain is working.  How to Empty Your Jackson-Pratt and Record the Drainage You will need to empty your Jackson-Pratt every day  Gather the following supplies:  Measuring container your nurse gave you Jackson-Pratt Drainage Record  Pen or pencil  Instructions Clean an area to work on. Clean your hands thoroughly. Unplug the stopper on top of your Jackson-Pratt. This will cause the bulb to expand. Do not touch the inside of the stopper or the inner area of the opening on the bulb. Turn your Jackson-Pratt upside down, gently squeeze the bulb, and pour the drainage into the measuring container. Turn your Jackson-Pratt right  side up. Squeeze the bulb until your fingers feel the palm of your hand. Keep squeezing the bulb while you replug the stopper. Make sure the bulb stays fully compressed to ensure constant, gentle suction.    Check the amount and color of drainage in the measuring container. The first couple days after surgery the fluid may be dark red. This is normal. As you heal the fluid may look pink or pale yellow. Record this amount and the color of drainage on your Jackson-Pratt Drainage Record. Flush the drainage down the toilet and rinse the measuring container with water.  Caring for the  Insertion Site  Once you have emptied the drainage, clean your hands again. Check the area around the insertion site. Look for tenderness, swelling, or pus. If you have any of these, or if you have a temperature of 101 F (38.3 C) or higher, you may have an infection. Call your doctors office.  Sometimes, the drain causes redness the size of a dime at your insertion site. This is normal. Your healthcare provider will tell you if you should place a bandage over the insertion site.  Wash drain site with soap & water (dilute hydrogen peroxide PRN) daily & replace clean dressing / tape    DAILY CARE  Keep the bulb compressed at all times, except while emptying it. The compression creates suction.   Keep sites where the tubes enter the skin dry and covered with a light bandage (dressing).   Tape the tubes to your skin, 1 to 2 inches below the insertion sites, to keep from pulling on your stitches. Tubes are stitched in place and will not slip out.   Pin the bulb to your shirt (not to your pants) with a safety pin.   For the first few days after surgery, there usually is more fluid in the bulb. Empty the bulb whenever it becomes half full because the bulb does not create enough suction if it is too full. Include this amount in your 24 hour totals.   When the amount of drainage decreases, empty the bulb at the same time every day. Write down the amounts and the 24 hour totals. Your caregiver will want to know them. This helps your caregiver know when the tubes can be removed.   (We anticipate removing the drain in 1-3 weeks, depending on when the output is <12m a day for 2+ days)  If there is drainage around the tube sites, change dressings and keep the area dry. If you see a clot in the tube, leave it alone. However, if the tube does not appear to be draining, let your caregiver know.  TO EMPTY THE BULB  Open the stopper to release suction.   Holding the stopper out of the way, pour  drainage into the measuring cup that was sent home with you.   Measure and write down the amount. If there are 2 bulbs, note the amount of drainage from bulb 1 or bulb 2 and keep the totals separate. Your caregiver will want to know which tube is draining more.   Compress the bulb by folding it in half.   Replace the stopper.   Check the tape that holds the tube to your skin, and pin the bulb to your shirt.  SEEK MEDICAL CARE IF:  The drainage develops a bad odor.   You have an oral temperature above 102 F (38.9 C).   The amount of drainage from your wound suddenly increases or decreases.  You accidentally pull out your drain.  °· You have any other questions or concerns.  °MAKE SURE YOU:  °· Understand these instructions.  °· Will watch your condition.  °· Will get help right away if you are not doing well or get worse.  ° ° °· Call our office if you have any questions about your drain. [phone] ° ° °GENERAL SURGERY: POST OP INSTRUCTIONS ° °###################################################################### ° °EAT °Gradually transition to a high fiber diet with a fiber supplement over the next few weeks after discharge.  Start with a pureed / full liquid diet (see below) ° °WALK °Walk an hour a day.  Control your pain to do that.   ° °CONTROL PAIN °Control pain so that you can walk, sleep, tolerate sneezing/coughing, go up/down stairs. ° °HAVE A BOWEL MOVEMENT DAILY °Keep your bowels regular to avoid problems.  OK to try a laxative to override constipation.  OK to use an antidairrheal to slow down diarrhea.  Call if not better after 2 tries ° °CALL IF YOU HAVE PROBLEMS/CONCERNS °Call if you are still struggling despite following these instructions. °Call if you have concerns not answered by these instructions ° °###################################################################### ° ° ° °1. DIET: Follow a light bland diet the first 24 hours after arrival home, such as soup, liquids,  crackers, etc.  Be sure to include lots of fluids daily.  Avoid fast food or heavy meals as your are more likely to get nauseated.   °2. Take your usually prescribed home medications unless otherwise directed. °3. PAIN CONTROL: °a. Pain is best controlled by a usual combination of three different methods TOGETHER: °i. Ice/Heat °ii. Over the counter pain medication °iii. Prescription pain medication °b. Most patients will experience some swelling and bruising around the incisions.  Ice packs or heating pads (30-60 minutes up to 6 times a day) will help. Use ice for the first few days to help decrease swelling and bruising, then switch to heat to help relax tight/sore spots and speed recovery.  Some people prefer to use ice alone, heat alone, alternating between ice & heat.  Experiment to what works for you.  Swelling and bruising can take several weeks to resolve.   °c. It is helpful to take an over-the-counter pain medication regularly for the first few weeks.  Choose one of the following that works best for you: °i. Naproxen (Aleve, etc)  Two 220mg tabs twice a day °ii. Ibuprofen (Advil, etc) Three 200mg tabs four times a day (every meal & bedtime) °iii. Acetaminophen (Tylenol, etc) 500-650mg four times a day (every meal & bedtime) °d. A  prescription for pain medication (such as oxycodone, hydrocodone, etc) should be given to you upon discharge.  Take your pain medication as prescribed.  °i. If you are having problems/concerns with the prescription medicine (does not control pain, nausea, vomiting, rash, itching, etc), please call us [phone] to see if we need to switch you to a different pain medicine that will work better for you and/or control your side effect better. °ii. If you need a refill on your pain medication, please contact your pharmacy.  They will contact our office to request authorization. Prescriptions will not be filled after 5 pm or on week-ends. °4. Avoid getting constipated.  Between the  surgery and the pain medications, it is common to experience some constipation.  Increasing fluid intake and taking a fiber supplement (such as Metamucil, Citrucel, FiberCon, MiraLax, etc) 1-2 times a day regularly will usually help prevent this   problem from occurring.  A mild laxative (prune juice, Milk of Magnesia, MiraLax, etc) should be taken according to package directions if there are no bowel movements after 48 hours.   °5. Wash / shower every day.  You may shower over the dressings as they are waterproof.  Continue to shower over incision(s) after the dressing is off. °6. Remove your waterproof bandages 5 days after surgery.  You may leave the incision open to air.  You may have skin tapes (Steri Strips) covering the incision(s).  Leave them on until one week, then remove.  You may replace a dressing/Band-Aid to cover the incision for comfort if you wish.  ° ° ° ° °7. ACTIVITIES as tolerated:   °a. You may resume regular (light) daily activities beginning the next day--such as daily self-care, walking, climbing stairs--gradually increasing activities as tolerated.  If you can walk 30 minutes without difficulty, it is safe to try more intense activity such as jogging, treadmill, bicycling, low-impact aerobics, swimming, etc. °b. Save the most intensive and strenuous activity for last such as sit-ups, heavy lifting, contact sports, etc  Refrain from any heavy lifting or straining until you are off narcotics for pain control.   °c. DO NOT PUSH THROUGH PAIN.  Let pain be your guide: If it hurts to do something, don't do it.  Pain is your body warning you to avoid that activity for another week until the pain goes down. °d. You may drive when you are no longer taking prescription pain medication, you can comfortably wear a seatbelt, and you can safely maneuver your car and apply brakes. °e. You may have sexual intercourse when it is comfortable.  °8. FOLLOW UP in our office °a. Please call [facility] at [phone]  to set up an appointment to see your surgeon in the office for a follow-up appointment approximately 2-3 weeks after your surgery. °b. Make sure that you call for this appointment the day you arrive home to insure a convenient appointment time. °9. IF YOU HAVE DISABILITY OR FAMILY LEAVE FORMS, BRING THEM TO THE OFFICE FOR PROCESSING.  DO NOT GIVE THEM TO YOUR DOCTOR. ° ° °WHEN TO CALL US [phone]: °1. Poor pain control °2. Reactions / problems with new medications (rash/itching, nausea, etc)  °3. Fever over 101.5 F (38.5 C) °4. Worsening swelling or bruising °5. Continued bleeding from incision. °6. Increased pain, redness, or drainage from the incision °7. Difficulty breathing / swallowing ° ° The clinic staff is available to answer your questions during regular business hours (8:30am-5pm).  Please don’t hesitate to call and ask to speak to one of our nurses for clinical concerns.  ° If you have a medical emergency, go to the nearest emergency room or call 911. ° A surgeon from [facility] is always on call at the hospitals ° ° °[facility] °[address], [address], [place], [place]  [address] ? °MAIN: [phone] ? TOLL FREE: [phone] ?  °FAX [phone] °[email or web address] ° ° °

## 2018-06-16 NOTE — Op Note (Addendum)
06/16/2018  5:37 PM  PATIENT:  Catherine [name]  72 y.o. female  Patient Care Team: Jonathon Jordan, MD as PCP - General (Family Medicine) Felecia Shelling, Nanine Means, MD as Consulting Physician (Neurology) Michael Boston, MD as Consulting Physician (General Surgery) Ronald Lobo, MD as Consulting Physician (Gastroenterology)  PRE-OPERATIVE DIAGNOSIS:  Right groin subcutaneous mass, 3 cm x 2 cm  POST-OPERATIVE DIAGNOSIS:  Right groin subcutaneous masses, largest 4 cm x 3 x 3 cm  PROCEDURE:  REMOVAL OF RIGHT GROIN / THIGH MASSES  SURGEON:  [name], MD  ASSISTANT: RN   ANESTHESIA:   Local anesthetic and field block in the subcutaneous tissues.  No deep local anesthetic  General  I&O Total I/O In: 965 [I.V.:915; IV Piggyback:50] Out: 10 [Blood:10]  Delay start of Pharmacological VTE agent (>24hrs) due to surgical blood loss or risk of bleeding:  NO  DRAINS: none   SPECIMEN:  No Specimen  DISPOSITION OF SPECIMEN:  N/A  COUNTS:  YES  PLAN OF CARE: Discharge to home after PACU  PATIENT DISPOSITION:  PACU - hemodynamically stable.  INDICATION: Pleasant woman was right inguinal mass excised consistent with lymphocele.  Recurrent pain and swelling and found to have a femoral hernia and femoral canal cystic mass.  Excised.  Consistent with a synovial cyst.  Femoral hernia repaired.  Initially did well.  Then felt new swelling in her inner thigh away from her prior areas.  CT scan showed fluid collection.  Aspirated.  Thick fluid concerning for recurrent synovial cyst.  Mass recurred.  Patient requested for groin exploration with possible excision.  The pathophysiology of skin & subcutaneous masses was discussed.  Natural history risks without surgery were discussed.  I recommended surgery to remove the mass.  I explained the technique of removal with use of local anesthesia & possible need for more aggressive sedation/anesthesia for patient comfort.    Risks such as bleeding,  infection, wound breakdown, heart attack, death, and other risks were discussed.  I noted a good likelihood this will help address the problem.   Possibility that this will not correct all symptoms was explained. Possibility of regrowth/recurrence of the mass was discussed.  We will work to minimize complications. Questions were answered.  The patient expresses understanding & wishes to proceed with surgery.  OR FINDINGS: Simple cystic mass 4 x 3 x 3 cm on proximal inner right thigh consistent with recurrent synovial cyst.  Smaller firm nodules in a cluster heading towards the femoral foramen concerning for a small synovial cyst as well.  Excised.  Blake drain left in place  DESCRIPTION:   The patient was identified & brought into the operating room. The patient was positioned supine with arms tucked. SCDs were active during the entire case. The patient underwent general anesthesia without any difficulty.  The abdomen was prepped and draped in a sterile fashion. A Surgical Timeout confirmed our plan.  Made an oblique incision in the medial right groin just above the crease between the thigh region.  Came to the subcutaneous tissues and the deep subcutaneous tissues going towards the right inner thigh was a cystic mass.  Able to come around it.  Was decompressed and had thick tan syrupy brown fluid within it.  Concerning for recurrent synovial cyst.  Skeletonized the more distal aspects on the proximal thigh and placed medium-size clips on it.  Elevate the mass off the inner thigh muscular compartment and excised the larger mass.  I could feel some small PE and  small marble sized cystic masses with fibrosis heading towards the femoral ring underneath the inguinal ligament.  Skeletonized this region carefully excised it.  Placed clips just at the femoral foramen.  Took care to avoid any vascular or nerve injury.  Hemostasis was good.  I did copious irrigation over a liter and reinspection.  Remaining  tissues were soft with no evidence of any persistent cystic masses or other areas of concern.  Most compartments intact.  Hemostasis good.  No evidence of any neurovascular injury.  Resulting 10 x 5 x 4 cm wound.  Side to place a 15 [place] Blake drain from the right lower quadrant through the subcutaneous tissues into this inguinal/proximal thigh pocket.  The skin with 2-0 Prolene suture.  Closed with 3-0 Vicryl interrupted sutures at Scarpa's and 4 Monocryl suture at the deep dermal subcuticular running fashion.  Dermabond placed.  Patient extubated in recovery room.  Talkative.  Has some soreness but moving extremities normally.  I discussed operative findings, updated the patient's status, discussed probable steps to recovery, and gave postoperative recommendations to the patient's spouse.  Recommendations were made.  Questions were answered.  He expressed understanding & appreciation.  I had discussed postoperative care with the patient in the holding area.   [name], M.D., F.A.C.S. Gastrointestinal and Minimally Invasive Surgery [facility] [address] [place], [place] [address] [phone] Main / Paging

## 2018-06-16 NOTE — Anesthesia Preprocedure Evaluation (Addendum)
Anesthesia Evaluation  Patient identified by MRN, date of birth, ID band Patient awake    Reviewed: Allergy & Precautions, NPO status , Patient's Chart, lab work & pertinent test results  Airway Mallampati: II  TM Distance: >3 FB Neck ROM: Full    Dental  (+) Dental Advisory Given, Teeth Intact   Pulmonary COPD,  COPD inhaler, former smoker,    breath sounds clear to auscultation       Cardiovascular hypertension, Pt. on medications  Rhythm:Regular Rate:Normal   4cm ascending aortic aneurysm   Neuro/Psych  Headaches, PSYCHIATRIC DISORDERS Anxiety Depression    GI/Hepatic Neg liver ROS, GERD  Medicated and Controlled,  Endo/Other  negative endocrine ROS  Renal/GU negative Renal ROS     Musculoskeletal  (+) Arthritis , Osteoarthritis,    Abdominal   Peds  Hematology negative hematology ROS (+)   Anesthesia Other Findings Possible anaphylaxis to penicillin  Reproductive/Obstetrics                           Anesthesia Physical Anesthesia Plan  ASA: II  Anesthesia Plan: General   Post-op Pain Management:    Induction: Intravenous  PONV Risk Score and Plan: 3 and Treatment may vary due to age or medical condition, Ondansetron and Dexamethasone  Airway Management Planned: LMA  Additional Equipment: None  Intra-op Plan:   Post-operative Plan: Extubation in OR  Informed Consent: I have reviewed the patients History and Physical, chart, labs and discussed the procedure including the risks, benefits and alternatives for the proposed anesthesia with the patient or authorized representative who has indicated his/her understanding and acceptance.   Dental advisory given  Plan Discussed with: Anesthesiologist and CRNA  Anesthesia Plan Comments:      Anesthesia Quick Evaluation

## 2018-06-16 NOTE — Anesthesia Postprocedure Evaluation (Signed)
Anesthesia Post Note  Patient: Catherine [name]  Procedure(s) Performed: REMOVAL OF RIGHT GROIN SUBCUTANEOUS MASS ERAS PATHWAY (Right Groin)     Patient location during evaluation: PACU Anesthesia Type: General Level of consciousness: awake and alert Pain management: pain level controlled Vital Signs Assessment: post-procedure vital signs reviewed and stable Respiratory status: spontaneous breathing, nonlabored ventilation, respiratory function stable and patient connected to nasal cannula oxygen Cardiovascular status: blood pressure returned to baseline and stable Postop Assessment: no apparent nausea or vomiting Anesthetic complications: no    Last Vitals:  Vitals:   06/16/18 1915 06/16/18 1925  BP: 122/62 106/63  Pulse: 67 70  Resp: 10 15  Temp:  36.6 C  SpO2: 99% 100%    Last Pain:  Vitals:   06/16/18 1925  TempSrc:   PainSc: 0-No pain                 Barnet Glasgow

## 2018-06-16 NOTE — Interval H&P Note (Signed)
History and Physical Interval Note:  06/16/2018 1:18 PM  Catherine [name]  has presented today for surgery, with the diagnosis of Right groin subcutaneous mass, 3 cm x 2 cm  The various methods of treatment have been discussed with the patient and family. After consideration of risks, benefits and other options for treatment, the patient has consented to  Procedure(s): REMOVAL OF [place] (Right) as a surgical intervention .  The patient's history has been reviewed, patient examined, no change in status, stable for surgery.  I have reviewed the patient's chart and labs.  Questions were answered to the patient's satisfaction.    I have re-reviewed the the patient's records, history, medications, and allergies.  I have re-examined the patient.  I again discussed intraoperative plans and goals of post-operative recovery.  The patient agrees to proceed.  Catherine [name]  10/15/1945 [id number]  Patient Care Team: Jonathon Jordan, MD as PCP - General (Family Medicine) Felecia Shelling, Nanine Means, MD as Consulting Physician (Neurology) Michael Boston, MD as Consulting Physician (General Surgery) Ronald Lobo, MD as Consulting Physician (Gastroenterology)  Patient Active Problem List   Diagnosis Date Noted  . Depression with anxiety 11/04/2016    Priority: High  . Anxiety   . Urgency of urination   . DDD (degenerative disc disease), lumbar   . Osteopenia 08/04/2017  . Right sided sciatica 02/18/2017  . Piriformis muscle pain 02/18/2017  . Right groin pain 11/04/2016  . Neuropathy 11/04/2016  . Numbness 11/04/2016  . Atypical chest pain 05/08/2014    Past Medical History:  Diagnosis Date  . Anxiety   . Arthritis    HANDS  . Chronic constipation   . COPD (chronic obstructive pulmonary disease) ([place])   . DDD (degenerative disc disease), lumbar    L2-3/3-4/4-5  . Dysuria   . Frequency of urination   . GERD (gastroesophageal reflux disease)   . Hypertension   .  Migraine   . Osteopenia 2019   TO OSTEOPOROSIS   . Pelvic pain in female   . Smokers' cough ([place])   . Urgency of urination    h/o INTERSTIAL CYSTITIS...DR. HUMPHRIES/GRAPEY  . Wears glasses     Past Surgical History:  Procedure Laterality Date  . ABDOMINAL HYSTERECTOMY  1982   W/  UNILATERAL SALPINGOOPHORECTOMY  . bilateral hernia surgery      removed ganglion cyst and swollen lymph node   . CHOLECYSTECTOMY OPEN  1987   W/  APPENDECTOMY  . COLONOSCOPY  2002  &  12-2012 & 03/19/15  . CYSTO WITH HYDRODISTENSION N/A 02/20/2014   Procedure: Patterson AND MARCAINE INSTILLATION;  Surgeon: Bernestine Amass, MD;  Location: [facility];  Service: Urology;  Laterality: N/A;  . EYE SURGERY     bilateral cataract surgery   . LAPAROSCOPIC UNILATERAL SALPINGOOPHORECTOMY  1984  . removal of lymph node cyst in right groin     . ROTATOR CUFF REPAIR Right 2003   and BONE SPUR    Social History   Socioeconomic History  . Marital status: Married    Spouse name: Not on file  . Number of children: Not on file  . Years of education: Not on file  . Highest education level: Not on file  Occupational History  . Not on file  Social Needs  . Financial resource strain: Not on file  . Food insecurity:    Worry: Not on file    Inability: Not on file  .  Transportation needs:    Medical: Not on file    Non-medical: Not on file  Tobacco Use  . Smoking status: Former Smoker    Packs/day: 0.75    Years: 48.00    Pack years: 36.00    Types: Cigarettes    Last attempt to quit: 09/04/2017    Years since quitting: 0.7  . Smokeless tobacco: Never Used  Substance and Sexual Activity  . Alcohol use: No  . Drug use: No  . Sexual activity: Not on file  Lifestyle  . Physical activity:    Days per week: Not on file    Minutes per session: Not on file  . Stress: Not on file  Relationships  . Social connections:    Talks on phone: Not on file    Gets  together: Not on file    Attends religious service: Not on file    Active member of club or organization: Not on file    Attends meetings of clubs or organizations: Not on file    Relationship status: Not on file  . Intimate partner violence:    Fear of current or ex partner: Not on file    Emotionally abused: Not on file    Physically abused: Not on file    Forced sexual activity: Not on file  Other Topics Concern  . Not on file  Social History Narrative  . Not on file    Family History  Problem Relation Age of Onset  . Esophageal cancer Mother 43  . Lung cancer Father 35    Medications Prior to Admission  Medication Sig Dispense Refill Last Dose  . albuterol (ACCUNEB) 0.63 MG/3ML nebulizer solution Take 1 ampule by nebulization 3 (three) times daily as needed for wheezing.   Taking  . albuterol (PROVENTIL HFA;VENTOLIN HFA) 108 (90 Base) MCG/ACT inhaler Inhale 2 puffs into the lungs every 4 (four) hours as needed for wheezing or shortness of breath.   Taking  . budesonide-formoterol (SYMBICORT) 160-4.5 MCG/ACT inhaler Inhale 2 puffs into the lungs 2 (two) times daily.   Taking  . calcium elemental as carbonate (BARIATRIC TUMS ULTRA) 400 MG chewable tablet Chew 2 tablets by mouth daily.    Taking  . Cholecalciferol (VITAMIN D3) 5000 units CAPS Take 5,000 Units by mouth daily.    Taking  . diltiazem (TAZTIA XT) 360 MG 24 hr capsule Take 360 mg by mouth daily.   Taking  . docusate sodium (COLACE) 100 MG capsule Take 200 mg by mouth 2 (two) times daily.   Taking  . esomeprazole (NEXIUM) 20 MG capsule Take 20 mg by mouth daily.   Taking  . fluticasone (FLONASE) 50 MCG/ACT nasal spray Place 1 spray into both nostrils daily.    Taking  . LORazepam (ATIVAN) 1 MG tablet Take 0.5-1 mg by mouth See admin instructions. Take 0.5 mg by mouth in the morning and take 1 mg by mouth at bedtime     . montelukast (SINGULAIR) 10 MG tablet Take 10 mg by mouth at bedtime.    Taking  . Naphazoline HCl  (CLEAR EYES OP) Place 2 drops into both eyes daily.     . SUMAtriptan (IMITREX) 25 MG tablet Take 25 mg by mouth every 2 (two) hours as needed for migraine or headache. May repeat in 2 hours if headache persists or recurs.   Taking  . tiotropium (SPIRIVA HANDIHALER) 18 MCG inhalation capsule Place 18 mcg into inhaler and inhale daily.    Taking  .  vitamin B-12 (CYANOCOBALAMIN) 1000 MCG tablet Take 2,500 mcg by mouth daily.    Taking    Current Facility-Administered Medications  Medication Dose Route Frequency Provider Last Rate Last Dose  . acetaminophen (TYLENOL) tablet 1,000 mg  1,000 mg Oral On Call to OR Michael Boston, MD      . Chlorhexidine Gluconate Cloth 2 % PADS 6 each  6 each Topical Once Michael Boston, MD       And  . Chlorhexidine Gluconate Cloth 2 % PADS 6 each  6 each Topical Once Jameisha Stofko, Remo Lipps, MD      . clindamycin (CLEOCIN) IVPB 900 mg  900 mg Intravenous On Call to OR Michael Boston, MD      . gabapentin (NEURONTIN) capsule 300 mg  300 mg Oral On Call to OR Michael Boston, MD         Allergies  Allergen Reactions  . Metoprolol Other (See Comments) and Cough    and wheezing  . Penicillins Shortness Of Breath, Rash and Other (See Comments)    Has patient had a PCN reaction causing immediate rash, facial/tongue/throat swelling, SOB or lightheadedness with hypotension: Yes Has patient had a PCN reaction causing severe rash involving mucus membranes or skin necrosis: No Has patient had a PCN reaction that required hospitalization: Yes - MD office Has patient had a PCN reaction occurring within the last 10 years: No If all of the above answers are "NO", then may proceed with Cephalosporin use.   . Advair Diskus [Fluticasone-Salmeterol] Anxiety  . Sulfa Antibiotics Rash    There were no vitals taken for this visit.  Labs: No results found for this or any previous visit (from the past 48 hour(s)).  Imaging / Studies: Ct Pelvis W Contrast  Result Date:  05/31/2018 CLINICAL DATA:  Persistent right groin and thigh pain after bilateral inguinal hernia repair. EXAM: CT PELVIS WITH CONTRAST TECHNIQUE: Multidetector CT imaging of the pelvis was performed using the standard protocol following the bolus administration of intravenous contrast. CONTRAST:  164mL ISOVUE-300 IOPAMIDOL (ISOVUE-300) INJECTION 61% COMPARISON:  CT scan of June 12, 2017. FINDINGS: Urinary Tract:  No abnormality visualized. Bowel: There is no evidence of bowel obstruction or inflammation. Sigmoid diverticulosis is noted. Vascular/Lymphatic: No pathologically enlarged lymph nodes. No significant vascular abnormality seen. Reproductive: Status post hysterectomy. No adnexal abnormality is noted. Other: Surgical clips are again noted in the right inguinal region. Probable seroma measuring 30 x 15 mm is noted in the right inguinal region which is increased in size compared to prior exam. No definite adenopathy is noted in either inguinal region. Musculoskeletal: No suspicious bone lesions identified. IMPRESSION: Surgical clips are again noted in the right inguinal region, with 30 x 15 mm probable seroma or lymphocele noted in right inguinal region which is enlarged compared to prior exam. Electronically Signed   By: Marijo Conception, M.D.   On: 05/31/2018 14:07     .[name], M.D., F.A.C.S. Gastrointestinal and Minimally Invasive Surgery [facility] [address], [place],  [address] [phone] Main / Paging  06/16/2018 1:19 PM    [name]

## 2018-06-16 NOTE — Anesthesia Procedure Notes (Signed)
Procedure Name: LMA Insertion Date/Time: 06/16/2018 4:04 PM Performed by: Victoriano Lain, CRNA Pre-anesthesia Checklist: Patient identified, Emergency Drugs available, Suction available, Patient being monitored and Timeout performed Patient Re-evaluated:Patient Re-evaluated prior to induction Oxygen Delivery Method: Circle system utilized Preoxygenation: Pre-oxygenation with 100% oxygen Induction Type: IV induction LMA: LMA with gastric port inserted LMA Size: 4.0 Number of attempts: 1 Placement Confirmation: positive ETCO2 and breath sounds checked- equal and bilateral Tube secured with: Tape Dental Injury: Teeth and Oropharynx as per pre-operative assessment

## 2018-06-16 NOTE — Transfer of Care (Signed)
Immediate Anesthesia Transfer of Care Note  Patient: Catherine [name]  Procedure(s) Performed: REMOVAL OF RIGHT GROIN SUBCUTANEOUS MASS ERAS PATHWAY (Right Groin)  Patient Location: PACU  Anesthesia Type:General  Level of Consciousness: awake, alert , oriented and patient cooperative  Airway & Oxygen Therapy: Patient Spontanous Breathing and Patient connected to face mask oxygen  Post-op Assessment: Report given to RN, Post -op Vital signs reviewed and stable and Patient moving all extremities  Post vital signs: Reviewed and stable  Last Vitals:  Vitals Value Taken Time  BP 130/64 06/16/2018  5:28 PM  Temp    Pulse 75 06/16/2018  5:29 PM  Resp 15 06/16/2018  5:29 PM  SpO2 100 % 06/16/2018  5:29 PM  Vitals shown include unvalidated device data.  Last Pain:  Vitals:   06/16/18 1333  TempSrc:   PainSc: 4       Patients Stated Pain Goal: 3 (09/26/34 1224)  Complications: No apparent anesthesia complications

## 2018-06-17 ENCOUNTER — Encounter (HOSPITAL_COMMUNITY): Payer: Self-pay | Admitting: Surgery

## 2018-06-28 ENCOUNTER — Other Ambulatory Visit: Payer: Self-pay | Admitting: Family Medicine

## 2018-06-28 DIAGNOSIS — Z1231 Encounter for screening mammogram for malignant neoplasm of breast: Secondary | ICD-10-CM

## 2018-07-26 ENCOUNTER — Ambulatory Visit: Payer: PPO | Admitting: Neurology

## 2018-07-26 ENCOUNTER — Encounter

## 2018-08-11 DIAGNOSIS — Z6823 Body mass index (BMI) 23.0-23.9, adult: Secondary | ICD-10-CM | POA: Diagnosis not present

## 2018-08-11 DIAGNOSIS — M810 Age-related osteoporosis without current pathological fracture: Secondary | ICD-10-CM | POA: Diagnosis not present

## 2018-08-12 ENCOUNTER — Ambulatory Visit
Admission: RE | Admit: 2018-08-12 | Discharge: 2018-08-12 | Disposition: A | Discharge status: Home / Self Care | Payer: PPO | Source: Ambulatory Visit | Attending: Family Medicine | Admitting: Family Medicine

## 2018-08-12 DIAGNOSIS — Z1231 Encounter for screening mammogram for malignant neoplasm of breast: Secondary | ICD-10-CM

## 2018-08-23 DIAGNOSIS — M810 Age-related osteoporosis without current pathological fracture: Secondary | ICD-10-CM | POA: Diagnosis not present

## 2018-09-10 DIAGNOSIS — D692 Other nonthrombocytopenic purpura: Secondary | ICD-10-CM | POA: Diagnosis not present

## 2018-09-10 DIAGNOSIS — M810 Age-related osteoporosis without current pathological fracture: Secondary | ICD-10-CM | POA: Diagnosis not present

## 2018-09-10 DIAGNOSIS — J309 Allergic rhinitis, unspecified: Secondary | ICD-10-CM | POA: Diagnosis not present

## 2018-09-10 DIAGNOSIS — Z5181 Encounter for therapeutic drug level monitoring: Secondary | ICD-10-CM | POA: Diagnosis not present

## 2018-09-10 DIAGNOSIS — F322 Major depressive disorder, single episode, severe without psychotic features: Secondary | ICD-10-CM | POA: Diagnosis not present

## 2018-09-10 DIAGNOSIS — G43109 Migraine with aura, not intractable, without status migrainosus: Secondary | ICD-10-CM | POA: Diagnosis not present

## 2018-09-10 DIAGNOSIS — J449 Chronic obstructive pulmonary disease, unspecified: Secondary | ICD-10-CM | POA: Diagnosis not present

## 2018-09-10 DIAGNOSIS — Z0000 Encounter for general adult medical examination without abnormal findings: Secondary | ICD-10-CM | POA: Diagnosis not present

## 2018-09-10 DIAGNOSIS — F419 Anxiety disorder, unspecified: Secondary | ICD-10-CM | POA: Diagnosis not present

## 2018-09-10 DIAGNOSIS — K644 Residual hemorrhoidal skin tags: Secondary | ICD-10-CM | POA: Diagnosis not present

## 2018-09-10 DIAGNOSIS — I10 Essential (primary) hypertension: Secondary | ICD-10-CM | POA: Diagnosis not present

## 2018-09-20 DIAGNOSIS — M6789 Other specified disorders of synovium and tendon, multiple sites: Secondary | ICD-10-CM | POA: Diagnosis not present

## 2018-09-20 DIAGNOSIS — R1031 Right lower quadrant pain: Secondary | ICD-10-CM | POA: Diagnosis not present

## 2018-09-20 DIAGNOSIS — R2241 Localized swelling, mass and lump, right lower limb: Secondary | ICD-10-CM | POA: Diagnosis not present

## 2018-09-20 DIAGNOSIS — M6749 Ganglion, multiple sites: Secondary | ICD-10-CM | POA: Diagnosis not present

## 2018-09-20 DIAGNOSIS — M7139 Other bursal cyst, multiple sites: Secondary | ICD-10-CM | POA: Diagnosis not present

## 2018-09-20 DIAGNOSIS — L7634 Postprocedural seroma of skin and subcutaneous tissue following other procedure: Secondary | ICD-10-CM | POA: Diagnosis not present

## 2018-09-20 DIAGNOSIS — G8929 Other chronic pain: Secondary | ICD-10-CM | POA: Diagnosis not present

## 2018-10-11 ENCOUNTER — Ambulatory Visit: Payer: Self-pay | Admitting: Surgery

## 2018-10-11 DIAGNOSIS — R2241 Localized swelling, mass and lump, right lower limb: Secondary | ICD-10-CM | POA: Diagnosis not present

## 2018-10-11 DIAGNOSIS — R1031 Right lower quadrant pain: Secondary | ICD-10-CM | POA: Diagnosis not present

## 2018-10-11 DIAGNOSIS — L7634 Postprocedural seroma of skin and subcutaneous tissue following other procedure: Secondary | ICD-10-CM | POA: Diagnosis not present

## 2018-10-11 DIAGNOSIS — G8929 Other chronic pain: Secondary | ICD-10-CM | POA: Diagnosis not present

## 2018-10-25 NOTE — Patient Instructions (Signed)
Catherine [name]  10/25/2018   Your procedure is scheduled on: 11/04/2018   Report to [facility] Main  Entrance  Report to admitting at   0930 AM    Call this number if you have problems the morning of surgery [phone]   Remember: Do not eat food or drink liquids :After Midnight. BRUSH YOUR TEETH MORNING OF SURGERY AND RINSE YOUR MOUTH OUT, NO CHEWING GUM CANDY OR MINTS.     Take these medicines the morning of surgery with A SIP OF WATER: Nebulizer if needed, Inhalers as usual and bring, eye drops as usual, diltiazem ( Taztia) , Nexium, Flonase, Ativan                                 You may not have any metal on your body including hair pins and              piercings  Do not wear jewelry, make-up, lotions, powders or perfumes, deodorant             Do not wear nail polish.  Do not shave  48 hours prior to surgery.                Do not bring valuables to the hospital. [place].  Contacts, dentures or bridgework may not be worn into surgery.  Leave suitcase in the car. After surgery it may be brought to your room.     Patients discharged the day of surgery will not be allowed to drive home. IF YOU ARE HAVING SURGERY AND GOING HOME THE SAME DAY, YOU MUST HAVE AN ADULT TO DRIVE YOU HOME AND BE WITH YOU FOR 24 HOURS. YOU MAY GO HOME BY TAXI OR UBER OR ORTHERWISE, BUT AN ADULT MUST ACCOMPANY YOU HOME AND STAY WITH YOU FOR 24 HOURS.  Name and phone number of your driver:                Please read over the following fact sheets you were given: _____________________________________________________________________             [facility] - Preparing for Surgery Before surgery, you can play an important role.  Because skin is not sterile, your skin needs to be as free of germs as possible.  You can reduce the number of germs on your skin by washing with CHG (chlorahexidine gluconate) soap before surgery.  CHG is  an antiseptic cleaner which kills germs and bonds with the skin to continue killing germs even after washing. Please DO NOT use if you have an allergy to CHG or antibacterial soaps.  If your skin becomes reddened/irritated stop using the CHG and inform your nurse when you arrive at Short Stay. Do not shave (including legs and underarms) for at least 48 hours prior to the first CHG shower.  You may shave your face/neck. Please follow these instructions carefully:  1.  Shower with CHG Soap the night before surgery and the  morning of Surgery.  2.  If you choose to wash your hair, wash your hair first as usual with your  normal  shampoo.  3.  After you shampoo, rinse your hair and body thoroughly to remove the  shampoo.  4.  Use CHG as you would any other liquid soap.  You can apply chg directly  to the skin and wash                       Gently with a scrungie or clean washcloth.  5.  Apply the CHG Soap to your body ONLY FROM THE NECK DOWN.   Do not use on face/ open                           Wound or open sores. Avoid contact with eyes, ears mouth and genitals (private parts).                       Wash face,  Genitals (private parts) with your normal soap.             6.  Wash thoroughly, paying special attention to the area where your surgery  will be performed.  7.  Thoroughly rinse your body with warm water from the neck down.  8.  DO NOT shower/wash with your normal soap after using and rinsing off  the CHG Soap.                9.  Pat yourself dry with a clean towel.            10.  Wear clean pajamas.            11.  Place clean sheets on your bed the night of your first shower and do not  sleep with pets. Day of Surgery : Do not apply any lotions/deodorants the morning of surgery.  Please wear clean clothes to the hospital/surgery center.  FAILURE TO FOLLOW THESE INSTRUCTIONS MAY RESULT IN THE CANCELLATION OF YOUR SURGERY PATIENT  SIGNATURE_________________________________  NURSE SIGNATURE__________________________________  ________________________________________________________________________

## 2018-10-26 ENCOUNTER — Inpatient Hospital Stay (HOSPITAL_COMMUNITY)
Admission: RE | Admit: 2018-10-26 | Discharge: 2018-10-26 | Disposition: A | Discharge status: Home / Self Care | Payer: PPO | Source: Ambulatory Visit

## 2018-10-29 ENCOUNTER — Encounter (HOSPITAL_COMMUNITY): Payer: Self-pay

## 2018-10-29 ENCOUNTER — Encounter (HOSPITAL_COMMUNITY)
Admission: RE | Admit: 2018-10-29 | Discharge: 2018-10-29 | Disposition: A | Discharge status: Home / Self Care | Payer: PPO | Source: Ambulatory Visit | Attending: Surgery | Admitting: Surgery

## 2018-10-29 ENCOUNTER — Other Ambulatory Visit: Payer: Self-pay

## 2018-10-29 DIAGNOSIS — Z01812 Encounter for preprocedural laboratory examination: Secondary | ICD-10-CM | POA: Diagnosis not present

## 2018-10-29 HISTORY — DX: Adverse effect of unspecified anesthetic, initial encounter: T41.45XA

## 2018-10-29 HISTORY — DX: Other complications of anesthesia, initial encounter: T88.59XA

## 2018-10-29 HISTORY — DX: Thoracic aortic aneurysm, without rupture: I71.2

## 2018-10-29 HISTORY — DX: Thoracic aortic aneurysm, without rupture, unspecified: I71.20

## 2018-10-29 LAB — CBC
HCT: 42 % (ref 36.0–46.0)
Hemoglobin: 13.6 g/dL (ref 12.0–15.0)
MCH: 29.3 pg (ref 26.0–34.0)
MCHC: 32.4 g/dL (ref 30.0–36.0)
MCV: 90.5 fL (ref 80.0–100.0)
Platelets: 237 10*3/uL (ref 150–400)
RBC: 4.64 MIL/uL (ref 3.87–5.11)
RDW: 12.4 % (ref 11.5–15.5)
WBC: 9.2 10*3/uL (ref 4.0–10.5)
nRBC: 0 % (ref 0.0–0.2)

## 2018-10-29 LAB — BASIC METABOLIC PANEL
Anion gap: 10 (ref 5–15)
BUN: 22 mg/dL (ref 8–23)
CO2: 21 mmol/L — AB (ref 22–32)
Calcium: 9 mg/dL (ref 8.9–10.3)
Chloride: 106 mmol/L (ref 98–111)
Creatinine, Ser: 0.96 mg/dL (ref 0.44–1.00)
GFR calc Af Amer: >60 mL/min (ref >60)
GFR calc non Af Amer: 59 mL/min — ABNORMAL LOW (ref >60)
Glucose, Bld: 106 mg/dL — ABNORMAL HIGH (ref 70–99)
Potassium: 4.5 mmol/L (ref 3.5–5.1)
SODIUM: 137 mmol/L (ref 135–145)

## 2018-10-29 NOTE — Progress Notes (Signed)
PCP - Jonathon Jordan Cardiologist - Patient sees Dr. Cyndia Bent for a TAA  Chest x-ray - n/a EKG - 06/16/2018 Stress Test - patient denies ECHO - patient denies Cardiac Cath - patient denies  Sleep Study - patient denies CPAP -   Fasting Blood Sugar - n/a Checks Blood Sugar _____ times a day  Blood Thinner Instructions: patient denies Aspirin Instructions: patient denies  Anesthesia review: n/a  Patient denies shortness of breath, fever, and chest pain at PAT appointment.  Patient states she has a cough that just started when she arrived in the parking deck due to drainage, possible allergies.  Patient confirms that she has not been in contact with anyone who is suspected on confirmed [place].  She also states she nor her husband have traveled anywhere in the last 14 days.  Patient given instructions regarding ERAS meds and liquids up to 0430 the morning of her surgery.  Patient stated that she has gabapentin prn at home but can only tolerate 100mg  when she does take it and there would not be any way she could take the 300mg  does ordered.  She is to contact Dr. Clyda Greener office regarding this to make sure it is ok for her to take her home dose prior to coming in DOS.  Patient verbalized understanding of instructions that were given to them at the PAT appointment. Patient was also instructed that they will need to review over the PAT instructions again at home before surgery.  Patient is aware and verbalized understanding of visitor restrictions at this time.

## 2018-10-29 NOTE — Pre-Procedure Instructions (Signed)
Catherine [name]  10/29/2018      [id number] - , [place] [address] Phone: [phone] Fax: [phone]    Your procedure is scheduled on Tuesday 11/02/2018.  Report to [facility] Admitting Main Entrance "A" at 0530 A.M.  Call this number if you have problems the morning of surgery:  [phone]   Remember:  Do not eat or drink after midnight.  You may drink clear liquids until 0430 .  Clear liquids allowed MVE:HMCNO, Juice (non-citric and without pulp), Carbonated beverages, Clear Tea, Black Coffee only and Gatorade    Take these medicines the morning of surgery with A SIP OF WATER: Albuterol Nebulizer - if needed Albuterol inhaler - if needed Budesonide-formoterol (Symbicort) Diltiazem (Taztia XT) Docusate sodium (Colace) Esomeprazole (Nexium) Fluticasone (Flonase) Gabapentin (Neurontin) - if needed Lorazepam (Ativan) Sumatriptan (Imitrex) - if needed Tiotropium (Spiriva) inhaler Tramadol (Ultram)  Please bring all inhalers with you the day of surgery.   7 days prior to surgery STOP taking any Aspirin (unless otherwise instructed by your surgeon), Aleve, Naproxen, Ibuprofen, Motrin, Advil, Goody's, BC's, all herbal medications, fish oil, and all vitamins.     Do not wear jewelry, make-up or nail polish.  Do not wear lotions, powders, or perfumes, or deodorant.  Do not shave 48 hours prior to surgery.    Do not bring valuables to the hospital.  [facility] is not responsible for any belongings or valuables.  Contacts, eyeglasses, hearing aids, dentures or bridgework may not be worn into surgery.  Leave your suitcase in the car.  After surgery it may be brought to your room.  For patients admitted to the hospital, discharge time will be determined by your treatment team.  Patients discharged the day of surgery will not be allowed to drive home.   Name and phone number of your driver:    Special  instructions:   [place]- Preparing For Surgery  Before surgery, you can play an important role. Because skin is not sterile, your skin needs to be as free of germs as possible. You can reduce the number of germs on your skin by washing with CHG (chlorahexidine gluconate) Soap before surgery.  CHG is an antiseptic cleaner which kills germs and bonds with the skin to continue killing germs even after washing.    Oral Hygiene is also important to reduce your risk of infection.  Remember - BRUSH YOUR TEETH THE MORNING OF SURGERY WITH YOUR REGULAR TOOTHPASTE  Please do not use if you have an allergy to CHG or antibacterial soaps. If your skin becomes reddened/irritated stop using the CHG.  Do not shave (including legs and underarms) for at least 48 hours prior to first CHG shower. It is OK to shave your face.  Please follow these instructions carefully.   1. Shower the NIGHT BEFORE SURGERY and the MORNING OF SURGERY with CHG.   2. If you chose to wash your hair, wash your hair first as usual with your normal shampoo.  3. After you shampoo, rinse your hair and body thoroughly to remove the shampoo.  4. Use CHG as you would any other liquid soap. You can apply CHG directly to the skin and wash gently with a scrungie or a clean washcloth.   5. Apply the CHG Soap to your body ONLY FROM THE NECK DOWN.  Do not use on open wounds or open sores. Avoid contact with your eyes, ears, mouth and  genitals (private parts). Wash Face and genitals (private parts)  with your normal soap.  6. Wash thoroughly, paying special attention to the area where your surgery will be performed.  7. Thoroughly rinse your body with warm water from the neck down.  8. DO NOT shower/wash with your normal soap after using and rinsing off the CHG Soap.  9. Pat yourself dry with a CLEAN TOWEL.  10. Wear CLEAN PAJAMAS to bed the night before surgery, wear comfortable clothes the morning of surgery  11. Place CLEAN SHEETS on  your bed the night of your first shower and DO NOT SLEEP WITH PETS.    Day of Surgery: Shower as stated above. Do not apply any deodorants/lotions.  Please wear clean clothes to the hospital/surgery center.   Remember to brush your teeth WITH YOUR REGULAR TOOTHPASTE.    Please read over the following fact sheets that you were given. Pain Booklet, Coughing and Deep Breathing and Surgical Site Infection Prevention

## 2018-11-01 MED ORDER — GENTAMICIN SULFATE 40 MG/ML IJ SOLN
5.0000 mg/kg | INTRAVENOUS | Status: DC
  Filled 2018-11-01: qty 8.75

## 2018-11-01 MED ORDER — CLINDAMYCIN PHOSPHATE 900 MG/50ML IV SOLN
900.0000 mg | INTRAVENOUS | Status: AC
  Administered 2018-11-02: 900 mg via INTRAVENOUS
  Filled 2018-11-01 (×2): qty 50

## 2018-11-01 MED ORDER — BUPIVACAINE LIPOSOME 1.3 % IJ SUSP
20.0000 mL | INTRAMUSCULAR | Status: AC
  Administered 2018-11-02: 20 mL
  Filled 2018-11-01: qty 20

## 2018-11-02 ENCOUNTER — Ambulatory Visit (HOSPITAL_COMMUNITY)
Admission: RE | Admit: 2018-11-02 | Discharge: 2018-11-02 | Disposition: A | Discharge status: Home / Self Care | Payer: PPO | Attending: Surgery | Admitting: Surgery

## 2018-11-02 ENCOUNTER — Ambulatory Visit (HOSPITAL_COMMUNITY): Payer: PPO | Admitting: Anesthesiology

## 2018-11-02 ENCOUNTER — Encounter (HOSPITAL_COMMUNITY): Payer: Self-pay

## 2018-11-02 ENCOUNTER — Other Ambulatory Visit: Payer: Self-pay

## 2018-11-02 ENCOUNTER — Encounter (HOSPITAL_COMMUNITY)
Admission: RE | Disposition: A | Discharge status: Home / Self Care | Payer: Self-pay | Source: Home / Self Care | Attending: Surgery

## 2018-11-02 ENCOUNTER — Ambulatory Visit (HOSPITAL_COMMUNITY): Payer: PPO | Admitting: Vascular Surgery

## 2018-11-02 DIAGNOSIS — R222 Localized swelling, mass and lump, trunk: Secondary | ICD-10-CM | POA: Diagnosis present

## 2018-11-02 DIAGNOSIS — F419 Anxiety disorder, unspecified: Secondary | ICD-10-CM | POA: Insufficient documentation

## 2018-11-02 DIAGNOSIS — Z79899 Other long term (current) drug therapy: Secondary | ICD-10-CM | POA: Diagnosis not present

## 2018-11-02 DIAGNOSIS — X58XXXA Exposure to other specified factors, initial encounter: Secondary | ICD-10-CM | POA: Diagnosis not present

## 2018-11-02 DIAGNOSIS — L928 Other granulomatous disorders of the skin and subcutaneous tissue: Secondary | ICD-10-CM | POA: Insufficient documentation

## 2018-11-02 DIAGNOSIS — M5136 Other intervertebral disc degeneration, lumbar region: Secondary | ICD-10-CM | POA: Diagnosis not present

## 2018-11-02 DIAGNOSIS — Z881 Allergy status to other antibiotic agents status: Secondary | ICD-10-CM | POA: Insufficient documentation

## 2018-11-02 DIAGNOSIS — I881 Chronic lymphadenitis, except mesenteric: Secondary | ICD-10-CM | POA: Diagnosis not present

## 2018-11-02 DIAGNOSIS — J449 Chronic obstructive pulmonary disease, unspecified: Secondary | ICD-10-CM | POA: Insufficient documentation

## 2018-11-02 DIAGNOSIS — R599 Enlarged lymph nodes, unspecified: Secondary | ICD-10-CM | POA: Diagnosis not present

## 2018-11-02 DIAGNOSIS — I712 Thoracic aortic aneurysm, without rupture: Secondary | ICD-10-CM | POA: Diagnosis not present

## 2018-11-02 DIAGNOSIS — T8131XA Disruption of external operation (surgical) wound, not elsewhere classified, initial encounter: Secondary | ICD-10-CM | POA: Diagnosis not present

## 2018-11-02 DIAGNOSIS — G43909 Migraine, unspecified, not intractable, without status migrainosus: Secondary | ICD-10-CM | POA: Insufficient documentation

## 2018-11-02 DIAGNOSIS — M19042 Primary osteoarthritis, left hand: Secondary | ICD-10-CM | POA: Diagnosis not present

## 2018-11-02 DIAGNOSIS — I889 Nonspecific lymphadenitis, unspecified: Secondary | ICD-10-CM | POA: Diagnosis not present

## 2018-11-02 DIAGNOSIS — I96 Gangrene, not elsewhere classified: Secondary | ICD-10-CM | POA: Diagnosis not present

## 2018-11-02 DIAGNOSIS — K5909 Other constipation: Secondary | ICD-10-CM | POA: Diagnosis not present

## 2018-11-02 DIAGNOSIS — Z882 Allergy status to sulfonamides status: Secondary | ICD-10-CM | POA: Diagnosis not present

## 2018-11-02 DIAGNOSIS — Z7951 Long term (current) use of inhaled steroids: Secondary | ICD-10-CM | POA: Insufficient documentation

## 2018-11-02 DIAGNOSIS — K219 Gastro-esophageal reflux disease without esophagitis: Secondary | ICD-10-CM | POA: Insufficient documentation

## 2018-11-02 DIAGNOSIS — Z880 Allergy status to penicillin: Secondary | ICD-10-CM | POA: Insufficient documentation

## 2018-11-02 DIAGNOSIS — Z87891 Personal history of nicotine dependence: Secondary | ICD-10-CM | POA: Insufficient documentation

## 2018-11-02 DIAGNOSIS — I10 Essential (primary) hypertension: Secondary | ICD-10-CM | POA: Diagnosis not present

## 2018-11-02 DIAGNOSIS — M19041 Primary osteoarthritis, right hand: Secondary | ICD-10-CM | POA: Insufficient documentation

## 2018-11-02 HISTORY — PX: EXCISION MASS LOWER EXTREMETIES: SHX6705

## 2018-11-02 SURGERY — EXCISION MASS LOWER EXTREMITIES
Anesthesia: General | Site: Groin | Laterality: Right

## 2018-11-02 MED ORDER — OXYCODONE HCL 5 MG/5ML PO SOLN: 5.0000 mg | Freq: Once | ORAL | Status: DC | PRN

## 2018-11-02 MED ORDER — BUPIVACAINE-EPINEPHRINE 0.25% -1:200000 IJ SOLN
INTRAMUSCULAR | Status: AC
  Filled 2018-11-02: qty 1

## 2018-11-02 MED ORDER — METHYLENE BLUE 0.5 % INJ SOLN
INTRAVENOUS | Status: AC
  Filled 2018-11-02: qty 10

## 2018-11-02 MED ORDER — ONDANSETRON HCL 4 MG/2ML IJ SOLN
INTRAMUSCULAR | Status: AC
  Filled 2018-11-02: qty 2

## 2018-11-02 MED ORDER — CHLORHEXIDINE GLUCONATE CLOTH 2 % EX PADS: 6.0000 | MEDICATED_PAD | Freq: Once | CUTANEOUS | Status: DC

## 2018-11-02 MED ORDER — FENTANYL CITRATE (PF) 100 MCG/2ML IJ SOLN
INTRAMUSCULAR | Status: AC
  Filled 2018-11-02: qty 2

## 2018-11-02 MED ORDER — FENTANYL CITRATE (PF) 100 MCG/2ML IJ SOLN
25.0000 ug | INTRAMUSCULAR | Status: DC | PRN
  Administered 2018-11-02: 25 ug via INTRAVENOUS

## 2018-11-02 MED ORDER — MIDAZOLAM HCL 2 MG/2ML IJ SOLN
INTRAMUSCULAR | Status: AC
  Filled 2018-11-02: qty 2

## 2018-11-02 MED ORDER — MENTHOL 3 MG MT LOZG
LOZENGE | OROMUCOSAL | Status: AC
  Administered 2018-11-02: 1 via ORAL
  Filled 2018-11-02: qty 9

## 2018-11-02 MED ORDER — BUPIVACAINE-EPINEPHRINE (PF) 0.25% -1:200000 IJ SOLN
INTRAMUSCULAR | Status: DC | PRN
  Administered 2018-11-02: 50 mL

## 2018-11-02 MED ORDER — 0.9 % SODIUM CHLORIDE (POUR BTL) OPTIME
TOPICAL | Status: DC | PRN
  Administered 2018-11-02: 1000 mL

## 2018-11-02 MED ORDER — DEXAMETHASONE SODIUM PHOSPHATE 10 MG/ML IJ SOLN
INTRAMUSCULAR | Status: DC | PRN
  Administered 2018-11-02: 10 mg via INTRAVENOUS

## 2018-11-02 MED ORDER — FENTANYL CITRATE (PF) 250 MCG/5ML IJ SOLN
INTRAMUSCULAR | Status: DC | PRN
  Administered 2018-11-02 (×5): 50 ug via INTRAVENOUS

## 2018-11-02 MED ORDER — SUCCINYLCHOLINE CHLORIDE 200 MG/10ML IV SOSY
PREFILLED_SYRINGE | INTRAVENOUS | Status: DC | PRN
  Administered 2018-11-02: 120 mg via INTRAVENOUS

## 2018-11-02 MED ORDER — EPHEDRINE SULFATE-NACL 50-0.9 MG/10ML-% IV SOSY
PREFILLED_SYRINGE | INTRAVENOUS | Status: DC | PRN
  Administered 2018-11-02 (×3): 5 mg via INTRAVENOUS

## 2018-11-02 MED ORDER — OXYCODONE HCL 5 MG PO TABS: 5.0000 mg | ORAL_TABLET | Freq: Once | ORAL | Status: DC | PRN

## 2018-11-02 MED ORDER — ONDANSETRON HCL 4 MG/2ML IJ SOLN: 4.0000 mg | Freq: Once | INTRAMUSCULAR | Status: DC | PRN

## 2018-11-02 MED ORDER — HYDROCODONE-ACETAMINOPHEN 5-325 MG PO TABS: 1.0000 | ORAL_TABLET | Freq: Four times a day (QID) | ORAL | 0 refills | Status: DC | PRN

## 2018-11-02 MED ORDER — DEXAMETHASONE SODIUM PHOSPHATE 10 MG/ML IJ SOLN
INTRAMUSCULAR | Status: AC
  Filled 2018-11-02: qty 1

## 2018-11-02 MED ORDER — ONDANSETRON HCL 4 MG/2ML IJ SOLN
INTRAMUSCULAR | Status: DC | PRN
  Administered 2018-11-02: 4 mg via INTRAVENOUS

## 2018-11-02 MED ORDER — PROPOFOL 10 MG/ML IV BOLUS
INTRAVENOUS | Status: AC
  Filled 2018-11-02: qty 20

## 2018-11-02 MED ORDER — LIDOCAINE 2% (20 MG/ML) 5 ML SYRINGE
INTRAMUSCULAR | Status: DC | PRN
  Administered 2018-11-02: 50 mg via INTRAVENOUS

## 2018-11-02 MED ORDER — PHENYLEPHRINE 40 MCG/ML (10ML) SYRINGE FOR IV PUSH (FOR BLOOD PRESSURE SUPPORT)
PREFILLED_SYRINGE | INTRAVENOUS | Status: DC | PRN
  Administered 2018-11-02 (×5): 80 ug via INTRAVENOUS

## 2018-11-02 MED ORDER — PROPOFOL 10 MG/ML IV BOLUS
INTRAVENOUS | Status: DC | PRN
  Administered 2018-11-02: 130 mg via INTRAVENOUS

## 2018-11-02 MED ORDER — FENTANYL CITRATE (PF) 250 MCG/5ML IJ SOLN
INTRAMUSCULAR | Status: AC
  Filled 2018-11-02: qty 5

## 2018-11-02 MED ORDER — ACETAMINOPHEN 500 MG PO TABS
1000.0000 mg | ORAL_TABLET | ORAL | Status: DC
  Filled 2018-11-02: qty 2

## 2018-11-02 MED ORDER — ROCURONIUM BROMIDE 50 MG/5ML IV SOSY
PREFILLED_SYRINGE | INTRAVENOUS | Status: AC
  Filled 2018-11-02: qty 5

## 2018-11-02 MED ORDER — GABAPENTIN 300 MG PO CAPS
300.0000 mg | ORAL_CAPSULE | ORAL | Status: AC
  Administered 2018-11-02: 300 mg via ORAL
  Filled 2018-11-02: qty 1

## 2018-11-02 MED ORDER — SUCCINYLCHOLINE CHLORIDE 200 MG/10ML IV SOSY
PREFILLED_SYRINGE | INTRAVENOUS | Status: AC
  Filled 2018-11-02: qty 10

## 2018-11-02 MED ORDER — MENTHOL 3 MG MT LOZG
1.0000 | LOZENGE | OROMUCOSAL | Status: DC | PRN
  Administered 2018-11-02: 1 via ORAL

## 2018-11-02 MED ORDER — EPHEDRINE 5 MG/ML INJ
INTRAVENOUS | Status: AC
  Filled 2018-11-02: qty 10

## 2018-11-02 MED ORDER — LACTATED RINGERS IV SOLN
INTRAVENOUS | Status: DC | PRN
  Administered 2018-11-02 (×2): via INTRAVENOUS

## 2018-11-02 MED ORDER — MIDAZOLAM HCL 2 MG/2ML IJ SOLN
0.5000 mg | Freq: Once | INTRAMUSCULAR | Status: AC
  Administered 2018-11-02: 0.5 mg via INTRAVENOUS

## 2018-11-02 MED ORDER — PHENYLEPHRINE 40 MCG/ML (10ML) SYRINGE FOR IV PUSH (FOR BLOOD PRESSURE SUPPORT)
PREFILLED_SYRINGE | INTRAVENOUS | Status: AC
  Filled 2018-11-02: qty 10

## 2018-11-02 MED ORDER — LIDOCAINE 2% (20 MG/ML) 5 ML SYRINGE
INTRAMUSCULAR | Status: AC
  Filled 2018-11-02: qty 5

## 2018-11-02 SURGICAL SUPPLY — 47 items
ADH SKN CLS APL DERMABOND .7 (GAUZE/BANDAGES/DRESSINGS) ×1
APPLIER CLIP 9.375 MED OPEN (MISCELLANEOUS) ×6
APR CLP MED 9.3 20 MLT OPN (MISCELLANEOUS) ×3
BLADE SURG 10 STRL SS (BLADE) ×1 IMPLANT
BLADE SURG 15 STRL LF DISP TIS (BLADE) IMPLANT
BLADE SURG 15 STRL SS (BLADE) ×2
CLIP APPLIE 9.375 MED OPEN (MISCELLANEOUS) IMPLANT
CONT SPEC 4OZ CLIKSEAL STRL BL (MISCELLANEOUS) ×1 IMPLANT
DERMABOND ADVANCED (GAUZE/BANDAGES/DRESSINGS) ×1
DERMABOND ADVANCED .7 DNX12 (GAUZE/BANDAGES/DRESSINGS) IMPLANT
DRAIN CHANNEL 15F RND FF W/TCR (WOUND CARE) ×1 IMPLANT
DRAPE ORTHO SPLIT 77X108 STRL (DRAPES) ×4
DRAPE SURG ORHT 6 SPLT 77X108 (DRAPES) IMPLANT
DRAPE UTILITY W/TAPE 26X15 (DRAPES) ×1 IMPLANT
DRSG TEGADERM 4X4.75 (GAUZE/BANDAGES/DRESSINGS) ×1 IMPLANT
EVACUATOR SILICONE 100CC (DRAIN) ×1 IMPLANT
GAUZE SPONGE 4X4 12PLY STRL (GAUZE/BANDAGES/DRESSINGS) ×1 IMPLANT
GAUZE SPONGE 4X4 16PLY XRAY LF (GAUZE/BANDAGES/DRESSINGS) ×1 IMPLANT
GLOVE BIO SURGEON STRL SZ 6 (GLOVE) ×1 IMPLANT
GLOVE BIO SURGEON STRL SZ 6.5 (GLOVE) ×1 IMPLANT
GLOVE BIO SURGEON STRL SZ7 (GLOVE) ×1 IMPLANT
GLOVE BIOGEL PI IND STRL 7.0 (GLOVE) IMPLANT
GLOVE BIOGEL PI IND STRL 8 (GLOVE) IMPLANT
GLOVE BIOGEL PI INDICATOR 7.0 (GLOVE) ×2
GLOVE BIOGEL PI INDICATOR 8 (GLOVE) ×1
GLOVE ECLIPSE 8.0 STRL XLNG CF (GLOVE) ×2 IMPLANT
GLOVE INDICATOR 6.5 STRL GRN (GLOVE) ×1 IMPLANT
GOWN STRL REUS W/ TWL LRG LVL3 (GOWN DISPOSABLE) IMPLANT
GOWN STRL REUS W/ TWL XL LVL3 (GOWN DISPOSABLE) IMPLANT
GOWN STRL REUS W/TWL 2XL LVL3 (GOWN DISPOSABLE) ×1 IMPLANT
GOWN STRL REUS W/TWL LRG LVL3 (GOWN DISPOSABLE) ×4
GOWN STRL REUS W/TWL XL LVL3 (GOWN DISPOSABLE) ×2
NEEDLE 22X1 1/2 (OR ONLY) (NEEDLE) ×1 IMPLANT
NS IRRIG 1000ML POUR BTL (IV SOLUTION) ×1 IMPLANT
PACK SURGICAL SETUP 50X90 (CUSTOM PROCEDURE TRAY) ×1 IMPLANT
PENCIL SMOKE EVACUATOR COATED (MISCELLANEOUS) ×1 IMPLANT
SPONGE LAP 18X18 RF (DISPOSABLE) ×1 IMPLANT
SUT ETHILON 3 0 FSL (SUTURE) ×1 IMPLANT
SUT MNCRL AB 4-0 PS2 18 (SUTURE) ×1 IMPLANT
SUT PROLENE 2 0 CT2 30 (SUTURE) ×1 IMPLANT
SUT VIC AB 2-0 SH 18 (SUTURE) ×2 IMPLANT
SWAB COLLECTION DEVICE MRSA (MISCELLANEOUS) ×1 IMPLANT
SYR BULB 3OZ (MISCELLANEOUS) ×1 IMPLANT
SYR CONTROL 10ML LL (SYRINGE) ×1 IMPLANT
TOWEL GREEN STERILE (TOWEL DISPOSABLE) ×1 IMPLANT
TUBE CONNECTING 12X1/4 (SUCTIONS) ×1 IMPLANT
YANKAUER SUCT BULB TIP NO VENT (SUCTIONS) ×1 IMPLANT

## 2018-11-02 NOTE — Transfer of Care (Signed)
Immediate Anesthesia Transfer of Care Note  Patient: Catherine [name]  Procedure(s) Performed: REMOVAL OF RIGHT GROIN AND THIGH SUBCUTANEOUS MASSES. (Right Groin)  Patient Location: PACU  Anesthesia Type:General  Level of Consciousness: awake, alert  and oriented  Airway & Oxygen Therapy: Patient Spontanous Breathing  Post-op Assessment: Report given to RN, Post -op Vital signs reviewed and stable and Patient moving all extremities X 4  Post vital signs: Reviewed and stable  Last Vitals:  Vitals Value Taken Time  BP 124/62 11/02/2018  9:44 AM  Temp    Pulse 93 11/02/2018  9:45 AM  Resp 18 11/02/2018  9:45 AM  SpO2 98 % 11/02/2018  9:45 AM  Vitals shown include unvalidated device data.  Last Pain:  Vitals:   11/02/18 0605  TempSrc:   PainSc: 5       Patients Stated Pain Goal: 2 (65/53/74 8270)  Complications: No apparent anesthesia complications

## 2018-11-02 NOTE — Anesthesia Procedure Notes (Signed)
Procedure Name: Intubation Date/Time: 11/02/2018 7:42 AM Performed by: Harden Mo, CRNA Pre-anesthesia Checklist: Patient identified, Emergency Drugs available, Suction available and Patient being monitored Patient Re-evaluated:Patient Re-evaluated prior to induction Oxygen Delivery Method: Circle System Utilized Preoxygenation: Pre-oxygenation with 100% oxygen Induction Type: IV induction and Rapid sequence Laryngoscope Size: Miller and 2 Grade View: Grade I Tube type: Oral Tube size: 7.0 mm Number of attempts: 1 Airway Equipment and Method: Stylet and Oral airway Placement Confirmation: ETT inserted through vocal cords under direct vision,  positive ETCO2 and breath sounds checked- equal and bilateral Secured at: 21 cm Tube secured with: Tape Dental Injury: Teeth and Oropharynx as per pre-operative assessment

## 2018-11-02 NOTE — H&P (Signed)
Catherine [name]  [birth year] [id number]  CARE TEAM:  PCP: Jonathon Jordan, MD  Outpatient Care Team: Patient Care Team: Jonathon Jordan, MD as PCP - General (Family Medicine) Felecia Shelling, Nanine Means, MD as Consulting Physician (Neurology) Michael Boston, MD as Consulting Physician (General Surgery) Ronald Lobo, MD as Consulting Physician (Gastroenterology)  Inpatient Treatment Team: Treatment Team: Attending Provider: Michael Boston, MD   This patient is a 73 y.o.female who presents today for surgical evaluation.   Chief complaint / Reason for evaluation: Recurrent groin masses with nonhealing wound and worsening pain/lymphadenopathy   The patient returns s/p laparoscopic repair of left inguinal and bilateral femoral hernias with mesh. Right iliac lymph node dissection with excision of iliofemoral canal cystic mass. 01/29/2018.  Incision and drainage June 16, 2018.  Pathology of lymph nodes consistent with reactive lymph nodes.  Pathology of iliofemoral canal cystic mass consistent with ganglion cyst   The returns to clinic after surgery, disheartened.  She notes that her groin pain and swelling went away. She had three great months where she was back to normal. Being active. Exercising in the gym. Doing well. However she felt a new lump has formed in her inner right groin close to her inner thigh. She began quickly to alternate between Aleve and Tylenol to control the pain. Trying to avoid narcotics. no fevers or chills or sweats. No nausea or vomiting. Moving her bowels weekly. Remains abstinent on smoking. Evaluated and aspirated the fluid. It is come back. She is concerned that the cyst will not go away without surgery.  We been trying to monitor but the swelling has worsened.  Her wound is opened back up.  Swelling is going on her thigh.  Pain is intensified.  No improvement with antibiotics and decreased physical activity.  Recommendation made to consider  surgery.  She is ready to proceed.  She remains abstinent from smoking. `   ` `    Assessment  Catherine [name]  73 y.o. female  Day of Surgery  Procedure(s): REMOVAL OF RIGHT GROIN AND THIGH SUBCUTANEOUS MASSES.  RIGHT GROIN LYMPH NODE DISSECTION.  Problem List:  Active Problems:   * No active hospital problems. *   Persistent groin mass with ulceration pain and spreading lymphadenopathy to thigh.  Plan:  I think this requires operative exploration.  Lymph node dissection.  Send tissues for culture and pathology.  I have asked my partner and surgical oncology with expertise in groin lymph node dissections to assist as well.  She is ready to proceed.  The pathophysiology of skin & subcutaneous masses was discussed.  Natural history risks without surgery were discussed.  I recommended surgery to remove the mass.  I explained the technique of removal with use of local anesthesia & possible need for more aggressive sedation/anesthesia for patient comfort.    Risks such as bleeding, infection, wound breakdown, heart attack, death, and other risks were discussed.  I noted a good likelihood this will help address the problem.   Possibility that this will not correct all symptoms was explained. Possibility of regrowth/recurrence of the mass was discussed.  We will work to minimize complications. Questions were answered.  The patient expresses understanding & wishes to proceed with surgery.      -VTE prophylaxis- SCDs, etc -mobilize as tolerated to help recovery  25 minutes spent in review, evaluation, examination, counseling, and coordination of care.  More than 50% of that time was spent in counseling.  [name], MD,  FACS, MASCRS Gastrointestinal and Minimally Invasive Surgery    [address] [place], [place] [address] [phone] Main / Paging [phone] Fax   11/02/2018      Past Medical History:  Diagnosis Date  . Anxiety   .  Arthritis    HANDS  . Chronic constipation   . Complication of anesthesia    upon waking up from anesthesia patient experienced chest pain - EKG done was negative, patient states "it may have been indigestion"  . COPD (chronic obstructive pulmonary disease) ([place])   . DDD (degenerative disc disease), lumbar    L2-3/3-4/4-5  . Dysuria   . Frequency of urination   . GERD (gastroesophageal reflux disease)   . Hypertension   . Migraine   . Osteopenia 2019   TO OSTEOPOROSIS   . Pelvic pain in female   . Smokers' cough ([place])   . Thoracic aortic aneurysm (TAA) ([place])   . Urgency of urination    h/o INTERSTIAL CYSTITIS...DR. HUMPHRIES/GRAPEY  . Wears glasses     Past Surgical History:  Procedure Laterality Date  . ABDOMINAL HYSTERECTOMY  1982   W/  UNILATERAL SALPINGOOPHORECTOMY  . bilateral hernia surgery      removed ganglion cyst and swollen lymph node   . CHOLECYSTECTOMY OPEN  1987   W/  APPENDECTOMY  . COLONOSCOPY  2002  &  12-2012 & 03/19/15  . CYSTO WITH HYDRODISTENSION N/A 02/20/2014   Procedure: [place] AND MARCAINE INSTILLATION;  Surgeon: Bernestine Amass, MD;  Location: [facility];  Service: Urology;  Laterality: N/A;  . EYE SURGERY     bilateral cataract surgery   . LAPAROSCOPIC UNILATERAL SALPINGOOPHORECTOMY  1984  . MASS EXCISION Right 06/16/2018   Procedure: REMOVAL OF RIGHT GROIN SUBCUTANEOUS MASS ERAS PATHWAY;  Surgeon: Michael Boston, MD;  Location: [facility];  Service: General;  Laterality: Right;  . removal of lymph node cyst in right groin     . ROTATOR CUFF REPAIR Right 2003   and BONE SPUR    Social History   Socioeconomic History  . Marital status: Married    Spouse name: Not on file  . Number of children: Not on file  . Years of education: Not on file  . Highest education level: Not on file  Occupational History  . Not on file  Social Needs  . Financial resource strain: Not on file  . Food insecurity:     Worry: Not on file    Inability: Not on file  . Transportation needs:    Medical: Not on file    Non-medical: Not on file  Tobacco Use  . Smoking status: Former Smoker    Packs/day: 0.75    Years: 48.00    Pack years: 36.00    Types: Cigarettes    Last attempt to quit: 09/04/2017    Years since quitting: 1.1  . Smokeless tobacco: Never Used  Substance and Sexual Activity  . Alcohol use: No  . Drug use: No  . Sexual activity: Not on file  Lifestyle  . Physical activity:    Days per week: Not on file    Minutes per session: Not on file  . Stress: Not on file  Relationships  . Social connections:    Talks on phone: Not on file    Gets together: Not on file    Attends religious service: Not on file    Active member of club or organization: Not on file  Attends meetings of clubs or organizations: Not on file    Relationship status: Not on file  . Intimate partner violence:    Fear of current or ex partner: Not on file    Emotionally abused: Not on file    Physically abused: Not on file    Forced sexual activity: Not on file  Other Topics Concern  . Not on file  Social History Narrative  . Not on file    Family History  Problem Relation Age of Onset  . Esophageal cancer Mother 22  . Lung cancer Father 34    Current Facility-Administered Medications  Medication Dose Route Frequency Provider Last Rate Last Dose  . acetaminophen (TYLENOL) tablet 1,000 mg  1,000 mg Oral On Call to OR Michael Boston, MD      . bupivacaine liposome (EXPAREL) 1.3 % injection 266 mg  20 mL Infiltration To OR Michael Boston, MD      . Chlorhexidine Gluconate Cloth 2 % PADS 6 each  6 each Topical Once Michael Boston, MD       And  . Chlorhexidine Gluconate Cloth 2 % PADS 6 each  6 each Topical Once Michael Boston, MD      . clindamycin (CLEOCIN) IVPB 900 mg  900 mg Intravenous To Rosemarie Beath, MD       And  . gentamicin (GARAMYCIN) 350 mg in dextrose 5 % 100 mL IVPB  5 mg/kg (Ideal)  Intravenous To Rosemarie Beath, MD       Facility-Administered Medications Ordered in Other Encounters  Medication Dose Route Frequency Provider Last Rate Last Dose  . lactated ringers infusion    Continuous PRN Harden Mo, CRNA         Allergies  Allergen Reactions  . Ciprofloxacin Hcl Other (See Comments)    Aortic aneurysm per MD  . Metoprolol Other (See Comments) and Cough    and wheezing  . Penicillins Shortness Of Breath, Rash and Other (See Comments)    Has patient had a PCN reaction causing immediate rash, facial/tongue/throat swelling, SOB or lightheadedness with hypotension: Yes Has patient had a PCN reaction causing severe rash involving mucus membranes or skin necrosis: No Has patient had a PCN reaction that required hospitalization: Yes - MD office Has patient had a PCN reaction occurring within the last 10 years: No If all of the above answers are "NO", then may proceed with Cephalosporin use.   . Advair Diskus [Fluticasone-Salmeterol] Anxiety  . Sulfa Antibiotics Rash    ROS:   All other systems reviewed & are negative except per HPI or as noted below: Constitutional:  No fevers, chills, sweats.  Weight stable Eyes:  No vision changes, No discharge HENT:  No sore throats, nasal drainage Lymph: No neck swelling, No bruising easily Pulmonary:  No cough, productive sputum CV: No orthopnea, PND  Patient walks 20 minutes for about 1 miles without difficulty.  No exertional chest/neck/shoulder/arm pain. GI: No personal nor family history of GI/colon cancer, inflammatory bowel disease, irritable bowel syndrome, allergy such as Celiac Sprue, dietary/dairy problems, colitis, ulcers nor gastritis.  No recent sick contacts/gastroenteritis.  No travel outside the country.  No changes in diet. Renal: No UTIs, No hematuria Genital:  No drainage, bleeding, masses Musculoskeletal: No severe joint pain.  Good ROM major joints Skin:  No sores or lesions.  No rashes  Heme/Lymph:  No easy bleeding.  No swollen lymph nodes Neuro: No focal weakness/numbness.  No seizures Psych: No suicidal ideation.  No hallucinations  BP (!) 151/67   Pulse 88   Temp 98.8 F (37.1 C) (Oral)   Resp 18   Ht 5\' 11"  (1.803 m)   Wt 74.6 kg   SpO2 99%   BMI 22.94 kg/m   Physical Exam: General: Pt awake/alert/oriented x4 in mild major acute distress Eyes: PERRL, normal EOM. Sclera nonicteric Neuro: CN II-XII intact w/o focal sensory/motor deficits. Lymph: No head/neck/groin lymphadenopathy Psych:  No delerium/psychosis/paranoia.  Mildly anxious.  Depressed.  Consolable. HENT: Normocephalic, Mucus membranes moist.  No thrush Neck: Supple, No tracheal deviation Chest: No pain.  Good respiratory excursion. CV:  Pulses intact.  Regular rhythm Abdomen: Soft, Nondistended.  Nontender.  No incarcerated hernias.  Gen:  No inguinal hernias.  No inguinal lymphadenopathy.   Right lower groin incision.  Lateral flat with minimal nodularity.  Medial aspect enlarged and sensitive with also chronic ulceration.  Less erythema.  Obvious lymph node/ellipsoid masses in proximal inner thigh.  Much more tender and sensitive compared to last visit  Ext:  SCDs BLE.  No significant edema.  No cyanosis Skin: No petechiae / purpurea.  No major sores Musculoskeletal: No severe joint pain.  Good ROM major joints   Results:   Labs: No results found for this or any previous visit (from the past 48 hour(s)).  Imaging / Studies: No results found.  Medications / Allergies: per chart  Antibiotics: Anti-infectives (From admission, onward)   Start     Dose/Rate Route Frequency Ordered Stop   11/02/18 0630  clindamycin (CLEOCIN) IVPB 900 mg     900 mg 100 mL/hr over 30 Minutes Intravenous To [facility] 11/01/18 1200 11/03/18 0630   11/01/18 1215  gentamicin (GARAMYCIN) 350 mg in dextrose 5 % 100 mL IVPB     5 mg/kg  70.8 kg (Ideal) 108.8 mL/hr over 60 Minutes Intravenous To  [facility] 11/01/18 1200 11/02/18 1215        Note: Portions of this report may have been transcribed using voice recognition software. Every effort was made to ensure accuracy; however, inadvertent computerized transcription errors may be present.   Any transcriptional errors that result from this process are unintentional.    [name], MD, FACS, MASCRS Gastrointestinal and Minimally Invasive Surgery    [address] [place], [place] [address] [phone] Main / Paging [phone] Fax   11/02/2018

## 2018-11-02 NOTE — Op Note (Signed)
11/02/2018  9:41 AM  PATIENT:  Catherine [name]  73 y.o. female  Patient Care Team: Jonathon Jordan, MD as PCP - General (Family Medicine) Felecia Shelling, Nanine Means, MD as Consulting Physician (Neurology) Michael Boston, MD as Consulting Physician (General Surgery) Ronald Lobo, MD as Consulting Physician (Gastroenterology)  PRE-OPERATIVE DIAGNOSIS:  RIGHT GROIN AND [place].  PROBABLE LYMPHADENOPATHY  POST-OPERATIVE DIAGNOSIS:  RIGHT GROIN AND THIGH MASSES.  PROBABLE LYMPHADENOPATHY  PROCEDURE: RIGHT GROIN & FEMORAL CANAL LYMPH NODE DISSECTION WITH REMOVAL OF GROIN AND THIGH SUBCUTANEOUS MASSES.  SURGEON:  [name], MD  ASSISTANT: Stark Klein, MD, FACS   ANESTHESIA:   local and general  EBL:  Total I/O In: 1000 [I.V.:1000] Out: 20 [Blood:20]  Delay start of Pharmacological VTE agent (>24hrs) due to surgical blood loss or risk of bleeding:  no  DRAINS: (15 Fr) Blake drain(s) in the abdomen to right thigh   SPECIMEN:  Source of Specimen:  Right groin & thigh masses  DISPOSITION OF SPECIMEN:   Specimens for aerobic, anaerobic, fungal, AFB culture.  Specimen for pathology.  COUNTS:  YES  PLAN OF CARE: Discharge to home after PACU  PATIENT DISPOSITION:  PACU - hemodynamically stable.  INDICATION: Pleasant woman with recurrent right groin masses.  Had excision.  Initial biopsy concerning for mucinous lymphangioma.  Had recurrence more deeply along the femoral canal within a femoral hernia.  Excised.  Consistent with synovial cyst.  Had no the recurrence that showed some inflammation and possible synovial cyst.  He had again, patient had recurrence now more on her thigh.  Old incision opened up with draining wound.  Not resolved with antibiotics.  Increasing swelling.  Increasing lymphadenopathy to inner thigh.  Felt that the patient had exhausted nonsurgical options and with worsening pain and progression lymphadenopathy recommend she made for operative excision with  probable lymph node dissection.  Recommended have surgical oncologist with such experiences well available.  Technique risk benefits alternatives discussed.  The pathophysiology of skin & subcutaneous masses was discussed.  Natural history risks without surgery were discussed.  I recommended surgery to remove the mass.  I explained the technique of removal with use of local anesthesia & possible need for more aggressive sedation/anesthesia for patient comfort.    Risks such as bleeding, infection, wound breakdown, heart attack, death, and other risks were discussed.  I noted a good likelihood this will help address the problem.   Possibility that this will not correct all symptoms was explained. Possibility of regrowth/recurrence of the mass was discussed.  We will work to minimize complications. Questions were answered.  The patient expresses understanding & wishes to proceed with surgery.     OR FINDINGS:   Right lower groin incision opened up with a chronically draining 2 x 2 centimeter ellipsoid mass.  Central yellow cheese material more consistent with necrosis and definite infection.  Swab for aerobic anaerobic culture.  Ellipsoid masses continuing over groin crease into proximal inner thigh towards the adductor canal.  Most likely consistent with inflamed irritated lymph nodes.  No rupture.  No mucinous or synovial type fluid noted.  Running along right saphenous vein.  Specimen sent for culture and pathology   15 [place] Blake drain goes in deeper thigh compartment.  Tunnel to SQ of right lower quadrant of abdomen per the patient's wishes.   DESCRIPTION:   Informed consent was confirmed. The patient received IV antibiotics. The patient underwent general anesthesia without any difficulty. The patient was positioned supine. SCDs were active  during the entire case. The right lower abdomen groin and proximal thigh were prepped and draped in a sterile fashion. A surgical timeout confirmed our  plan.   I made a biconcave ellipsoid incision around her old right lower groin incision.  Excise the ulcerated ellipsoid mass.  Material could be expressed within it that was sent for aerobic and anaerobic culture.  Gotten to the subcutaneous tissues down to the level of the lower fascia and inguinal ligament.  Continued the lymphatics down towards the inner thigh compartment.  Ellipsoid masses that correlated with what was felt on examination were noted in the subcutaneous tissues of the inner right thigh.  Was able to come posterior to it along the inner right thigh compartment and saphenous vein.  Meticulous care was to excise the inguinal lymph nodes and ellipsoid masses along this canal.  Freddrick March it off the adductor muscle.  Skeletonized it off the saphenous vein.  Titanium clips were used along with focused cautery and careful blunt and sharp dissection.  Eventually we were able to excise into softer subcutaneous tissues going more towards the proximal mid thigh.  Transected and came back proximally towards the medial thigh compartment towards the femoral canal and takeoff of a saphenous vein.  Sharply freed around.  Specimen sent off.  There is still some nodularity along with a prior subcutaneous tissue right groin.  This is carefully excised and removed to the level of the fascia and inguinal ligament.  Most likely consistent with old scarring and some contracted old fat.  I encountered no mucinous or synovial fluid type lesions.  There is no evidence of any inguinal or femoral hernia recurrence.  After excision the saphenous vein and inner anterior adductor & thigh compartment musculature was noted clean, healthy, and not involved.  Rest of the running subcutaneous tissues were soft and noninflamed.  No nodularity remained.  We did copious irrigation and changed gloves and instruments.  Drain placed such that the tip goes to the inner thigh and overlies the anterior thigh muscle compartment.  She had  wished to tunnel to her abdomen and not her thigh, so I did that.  Exit site secured to the skin with 2-0 Prolene suture. Close the wound using 2-0 Vicryl on the deeper subcutaneous tissues to help cover up the anterior inner thigh compartment.  Also closed some deep dermal stitches.  Then running 4-0 Monocryl subcuticular suture.   Dermabond placed over the groin and inner thigh incision.  Sterile dressing applied around drain.    Patient extubated without difficulty.  She is being transferred to the PACU recovery room.  I discussed operative findings, updated the patient's status, discussed probable steps to recovery, and gave postoperative recommendations to the patient's spouse.  Recommendations were made.  Questions were answered.  He expressed understanding & appreciation.     [name], M.D., F.A.C.S. Gastrointestinal and Minimally Invasive Surgery [facility] [address], [place], [place] [address] [phone] Main / Paging

## 2018-11-02 NOTE — Interval H&P Note (Signed)
History and Physical Interval Note:  11/02/2018 7:20 AM  Catherine [name]  has presented today for surgery, with the diagnosis of [place].  PROBABLE LYMPHADENOPATHY.  The various methods of treatment have been discussed with the patient and family. After consideration of risks, benefits and other options for treatment, the patient has consented to  Procedure(s): REMOVAL OF RIGHT GROIN AND THIGH SUBCUTANEOUS MASSES.  RIGHT GROIN LYMPH NODE DISSECTION. (Right) as a surgical intervention.  The patient's history has been reviewed, patient examined, no change in status, stable for surgery.  I have reviewed the patient's chart and labs.  Questions were answered to the patient's satisfaction.    I have re-reviewed the the patient's records, history, medications, and allergies.  I have re-examined the patient.  I again discussed intraoperative plans and goals of post-operative recovery.  The patient agrees to proceed.  Catherine [name]  [birth year] [id number]  Patient Care Team: Jonathon Jordan, MD as PCP - General (Family Medicine) Felecia Shelling, Nanine Means, MD as Consulting Physician (Neurology) Michael Boston, MD as Consulting Physician (General Surgery) Ronald Lobo, MD as Consulting Physician (Gastroenterology)  Patient Active Problem List   Diagnosis Date Noted  . Depression with anxiety 11/04/2016    Priority: High  . Right groin mass 06/16/2018  . Anxiety   . Urgency of urination   . DDD (degenerative disc disease), lumbar   . Osteopenia 08/04/2017  . Right sided sciatica 02/18/2017  . Piriformis muscle pain 02/18/2017  . Right groin pain 11/04/2016  . Neuropathy 11/04/2016  . Numbness 11/04/2016  . Atypical chest pain 05/08/2014    Past Medical History:  Diagnosis Date  . Anxiety   . Arthritis    HANDS  . Chronic constipation   . Complication of anesthesia    upon waking up from anesthesia patient experienced chest pain - EKG done was negative, patient states "it  may have been indigestion"  . COPD (chronic obstructive pulmonary disease) ([place])   . DDD (degenerative disc disease), lumbar    L2-3/3-4/4-5  . Dysuria   . Frequency of urination   . GERD (gastroesophageal reflux disease)   . Hypertension   . Migraine   . Osteopenia 2019   TO OSTEOPOROSIS   . Pelvic pain in female   . Smokers' cough ([place])   . Thoracic aortic aneurysm (TAA) ([place])   . Urgency of urination    h/o INTERSTIAL CYSTITIS...DR. HUMPHRIES/GRAPEY  . Wears glasses     Past Surgical History:  Procedure Laterality Date  . ABDOMINAL HYSTERECTOMY  1982   W/  UNILATERAL SALPINGOOPHORECTOMY  . bilateral hernia surgery      removed ganglion cyst and swollen lymph node   . CHOLECYSTECTOMY OPEN  1987   W/  APPENDECTOMY  . COLONOSCOPY  2002  &  12-2012 & 03/19/15  . CYSTO WITH HYDRODISTENSION N/A 02/20/2014   Procedure: [place] AND MARCAINE INSTILLATION;  Surgeon: Bernestine Amass, MD;  Location: [facility];  Service: Urology;  Laterality: N/A;  . EYE SURGERY     bilateral cataract surgery   . LAPAROSCOPIC UNILATERAL SALPINGOOPHORECTOMY  1984  . MASS EXCISION Right 06/16/2018   Procedure: REMOVAL OF RIGHT GROIN SUBCUTANEOUS MASS ERAS PATHWAY;  Surgeon: Michael Boston, MD;  Location: [facility];  Service: General;  Laterality: Right;  . removal of lymph node cyst in right groin     . ROTATOR CUFF REPAIR Right 2003   and BONE SPUR    Social History  Socioeconomic History  . Marital status: Married    Spouse name: Not on file  . Number of children: Not on file  . Years of education: Not on file  . Highest education level: Not on file  Occupational History  . Not on file  Social Needs  . Financial resource strain: Not on file  . Food insecurity:    Worry: Not on file    Inability: Not on file  . Transportation needs:    Medical: Not on file    Non-medical: Not on file  Tobacco Use  . Smoking status: Former Smoker     Packs/day: 0.75    Years: 48.00    Pack years: 36.00    Types: Cigarettes    Last attempt to quit: 09/04/2017    Years since quitting: 1.1  . Smokeless tobacco: Never Used  Substance and Sexual Activity  . Alcohol use: No  . Drug use: No  . Sexual activity: Not on file  Lifestyle  . Physical activity:    Days per week: Not on file    Minutes per session: Not on file  . Stress: Not on file  Relationships  . Social connections:    Talks on phone: Not on file    Gets together: Not on file    Attends religious service: Not on file    Active member of club or organization: Not on file    Attends meetings of clubs or organizations: Not on file    Relationship status: Not on file  . Intimate partner violence:    Fear of current or ex partner: Not on file    Emotionally abused: Not on file    Physically abused: Not on file    Forced sexual activity: Not on file  Other Topics Concern  . Not on file  Social History Narrative  . Not on file    Family History  Problem Relation Age of Onset  . Esophageal cancer Mother 34  . Lung cancer Father 82    Medications Prior to Admission  Medication Sig Dispense Refill Last Dose  . acetaminophen (TYLENOL) 500 MG tablet Take 1,000 mg by mouth at bedtime.   11/02/2018 at 0500  . budesonide-formoterol (SYMBICORT) 160-4.5 MCG/ACT inhaler Inhale 2 puffs into the lungs 2 (two) times daily.   11/02/2018 at 0500  . calcium elemental as carbonate (BARIATRIC TUMS ULTRA) 400 MG chewable tablet Chew 2 tablets by mouth daily.    Past Week at Unknown time  . carboxymethylcellulose (REFRESH PLUS) 0.5 % SOLN Place 2 drops into both eyes 2 (two) times daily.   11/02/2018 at 0500  . Carboxymethylcellulose Sodium (REFRESH LIQUIGEL) 1 % GEL Place 1 drop into both eyes at bedtime.   11/01/2018 at Unknown time  . Cholecalciferol (VITAMIN D3) 5000 units CAPS Take 5,000 Units by mouth daily.    Past Week at Unknown time  . Cyanocobalamin (VITAMIN B-12 PO) Take 2,500  mcg by mouth daily.    Past Week at Unknown time  . diltiazem (TAZTIA XT) 360 MG 24 hr capsule Take 360 mg by mouth daily.   11/02/2018 at 0400  . docusate sodium (COLACE) 100 MG capsule Take 200 mg by mouth daily.    11/01/2018 at Unknown time  . doxycycline (DORYX) 100 MG EC tablet Take 100 mg by mouth 2 (two) times daily.   11/01/2018 at Unknown time  . esomeprazole (NEXIUM) 20 MG capsule Take 20 mg by mouth daily.   11/02/2018 at 0400  .  fluticasone (FLONASE) 50 MCG/ACT nasal spray Place 1 spray into both nostrils daily.    11/02/2018 at 0400  . gabapentin (NEURONTIN) 100 MG capsule Take 100 mg by mouth daily as needed (pain).   Past Month at Unknown time  . LORazepam (ATIVAN) 1 MG tablet Take 0.5-1 mg by mouth See admin instructions. Take 0.5 mg by mouth in the morning and take 1 mg by mouth at bedtime   11/02/2018 at 0500  . montelukast (SINGULAIR) 10 MG tablet Take 10 mg by mouth at bedtime.    11/01/2018 at Unknown time  . naproxen sodium (ALEVE) 220 MG tablet Take 440 mg by mouth every morning.   Past Week at Unknown time  . polyethylene glycol (MIRALAX / GLYCOLAX) packet Take 17 g by mouth daily.   11/01/2018 at Unknown time  . tiotropium (SPIRIVA HANDIHALER) 18 MCG inhalation capsule Place 18 mcg into inhaler and inhale daily.    11/02/2018 at 0400  . traMADol (ULTRAM) 50 MG tablet Take 50 mg by mouth every 6 (six) hours as needed for moderate pain.   Past Week at Unknown time  . albuterol (ACCUNEB) 0.63 MG/3ML nebulizer solution Take 1 ampule by nebulization 3 (three) times daily as needed for wheezing.   Unknown at Unknown time  . albuterol (PROVENTIL HFA;VENTOLIN HFA) 108 (90 Base) MCG/ACT inhaler Inhale 2 puffs into the lungs every 4 (four) hours as needed for wheezing or shortness of breath.   Unknown at Unknown time  . denosumab (PROLIA) 60 MG/ML SOSY injection Inject 60 mg into the skin every 6 (six) months.   More than a month at Unknown time  . HYDROcodone-acetaminophen (NORCO) 5-325 MG  tablet Take 1-2 tablets by mouth every 6 (six) hours as needed for moderate pain or severe pain. (Patient not taking: Reported on 10/25/2018) 30 tablet 0 Not Taking at Unknown time  . SUMAtriptan (IMITREX) 25 MG tablet Take 25 mg by mouth every 2 (two) hours as needed for migraine or headache. May repeat in 2 hours if headache persists or recurs.   More than a month at Unknown time    Current Facility-Administered Medications  Medication Dose Route Frequency Provider Last Rate Last Dose  . acetaminophen (TYLENOL) tablet 1,000 mg  1,000 mg Oral On Call to OR Michael Boston, MD      . bupivacaine liposome (EXPAREL) 1.3 % injection 266 mg  20 mL Infiltration To OR Michael Boston, MD      . Chlorhexidine Gluconate Cloth 2 % PADS 6 each  6 each Topical Once Michael Boston, MD       And  . Chlorhexidine Gluconate Cloth 2 % PADS 6 each  6 each Topical Once Michael Boston, MD      . clindamycin (CLEOCIN) IVPB 900 mg  900 mg Intravenous To Rosemarie Beath, MD       And  . gentamicin (GARAMYCIN) 350 mg in dextrose 5 % 100 mL IVPB  5 mg/kg (Ideal) Intravenous To Rosemarie Beath, MD       Facility-Administered Medications Ordered in Other Encounters  Medication Dose Route Frequency Provider Last Rate Last Dose  . lactated ringers infusion    Continuous PRN Harden Mo, CRNA         Allergies  Allergen Reactions  . Ciprofloxacin Hcl Other (See Comments)    Aortic aneurysm per MD  . Metoprolol Other (See Comments) and Cough    and wheezing  . Penicillins Shortness Of Breath, Rash and Other (See  Comments)    Has patient had a PCN reaction causing immediate rash, facial/tongue/throat swelling, SOB or lightheadedness with hypotension: Yes Has patient had a PCN reaction causing severe rash involving mucus membranes or skin necrosis: No Has patient had a PCN reaction that required hospitalization: Yes - MD office Has patient had a PCN reaction occurring within the last 10 years: No If all of the  above answers are "NO", then may proceed with Cephalosporin use.   . Advair Diskus [Fluticasone-Salmeterol] Anxiety  . Sulfa Antibiotics Rash    BP (!) 151/67   Pulse 88   Temp 98.8 F (37.1 C) (Oral)   Resp 18   Ht 5\' 11"  (1.803 m)   Wt 74.6 kg   SpO2 99%   BMI 22.94 kg/m   Labs: No results found for this or any previous visit (from the past 48 hour(s)).  Imaging / Studies: No results found.   [name], M.D., F.A.C.S. Gastrointestinal and Minimally Invasive Surgery [facility] [address], [place], [place] [address] [phone] Main / Paging  11/02/2018 7:20 AM   [name]

## 2018-11-02 NOTE — Anesthesia Preprocedure Evaluation (Signed)
Anesthesia Evaluation  Patient identified by MRN, date of birth, ID band Patient awake    Reviewed: Allergy & Precautions, NPO status , Patient's Chart, lab work & pertinent test results  Airway Mallampati: II  TM Distance: >3 FB Neck ROM: Full    Dental  (+) Teeth Intact, Dental Advisory Given   Pulmonary former smoker,    breath sounds clear to auscultation       Cardiovascular hypertension,  Rhythm:Regular Rate:Normal     Neuro/Psych    GI/Hepatic   Endo/Other    Renal/GU      Musculoskeletal   Abdominal   Peds  Hematology   Anesthesia Other Findings   Reproductive/Obstetrics                             Anesthesia Physical Anesthesia Plan  ASA: III  Anesthesia Plan: General   Post-op Pain Management:    Induction: Intravenous  PONV Risk Score and Plan: 1 and Ondansetron and Dexamethasone  Airway Management Planned: LMA  Additional Equipment:   Intra-op Plan:   Post-operative Plan:   Informed Consent: I have reviewed the patients History and Physical, chart, labs and discussed the procedure including the risks, benefits and alternatives for the proposed anesthesia with the patient or authorized representative who has indicated his/her understanding and acceptance.     Dental advisory given  Plan Discussed with: CRNA and Anesthesiologist  Anesthesia Plan Comments:         Anesthesia Quick Evaluation

## 2018-11-02 NOTE — Discharge Instructions (Signed)
DRAIN CARE:   You have a closed bulb drain to help you heal.    A bulb drain is a small, plastic reservoir which creates a gentle suction. It is used to remove excess fluid from a surgical wound. The color and amount of fluid will vary. Immediately after surgery, the fluid is bright red. It may gradually change to a yellow color. When the amount decreases to about 1 or 2 tablespoons (15 to 30 cc) per 24 hours, your caregiver will usually remove it.  JP Care  The Jackson-Pratt drainage system has flexible tubing attached to a soft, plastic bulb with a stopper. The drainage end of the tubing, which is flat and white, goes into your body through a small opening near your incision (surgical cut). A stitch holds the drainage end in place. The rest of the tube is outside your body, attached to the bulb. When the bulb is compressed with the stopper in place, it creates a vacuum. This causes a constant gentle suction, which helps draw out fluid that collects under your incision. The bulb should be compressed at all times, except when you are emptying the drainage.  How long you will have your Jackson-Pratt depends on your surgery and the amount of fluid is draining. This is different for everyone. The Jackson-Pratt is usually removed when the drainage is 30 mL or less over 24 hours. To keep track of how much drainage youre having, you will record the amount in a drainage log. Its important to bring the log with you to your follow-up appointments.  Caring for Your Jackson-Pratt at Home In order to care for your Jackson-Pratt at home, you or your caregiver will do the following:  Empty the drain once a day and record the color and amount of drainage  Care for the area where the tubing enters your skin by washing with soap and water.  Milk the tubing to help move clots into the bulb.  Do this before you empty and measure your drainage. Look in the mirror at the tubing. This will help you see where your  hands need to be. Pinch the tubing close to where it goes into your skin between your thumb and forefinger. With the thumb and forefinger of your other hand, pinch the tubing right below your other fingers. Keep your fingers pinched and slide them down the tubing, pushing any clots down toward the bulb. You may want to use alcohol swabs to help you slide your fingers down the tubing. Repeat steps 3 and 4 as necessary to push clots from the tubing into the bulb. If you are not able to move a clot into the bulb, call your doctors office. The fluid may leak around the insertion site if a clot is blocking the drainage flow. If there is fluid in the bulb and no leakage at the insertion site, the drain is working.  How to Empty Your Jackson-Pratt and Record the Drainage You will need to empty your Jackson-Pratt every day  Gather the following supplies:  Measuring container your nurse gave you Jackson-Pratt Drainage Record  Pen or pencil  Instructions Clean an area to work on. Clean your hands thoroughly. Unplug the stopper on top of your Jackson-Pratt. This will cause the bulb to expand. Do not touch the inside of the stopper or the inner area of the opening on the bulb. Turn your Jackson-Pratt upside down, gently squeeze the bulb, and pour the drainage into the measuring container. Turn your Jackson-Pratt right  side up. Squeeze the bulb until your fingers feel the palm of your hand. Keep squeezing the bulb while you replug the stopper. Make sure the bulb stays fully compressed to ensure constant, gentle suction.    Check the amount and color of drainage in the measuring container. The first couple days after surgery the fluid may be dark red. This is normal. As you heal the fluid may look pink or pale yellow. Record this amount and the color of drainage on your Jackson-Pratt Drainage Record. Flush the drainage down the toilet and rinse the measuring container with water.  Caring for the  Insertion Site  Once you have emptied the drainage, clean your hands again. Check the area around the insertion site. Look for tenderness, swelling, or pus. If you have any of these, or if you have a temperature of 101 F (38.3 C) or higher, you may have an infection. Call your doctors office.  Sometimes, the drain causes redness the size of a dime at your insertion site. This is normal. Your healthcare provider will tell you if you should place a bandage over the insertion site.  Wash drain site with soap & water (dilute hydrogen peroxide PRN) daily & replace clean dressing / tape    DAILY CARE  Keep the bulb compressed at all times, except while emptying it. The compression creates suction.   Keep sites where the tubes enter the skin dry and covered with a light bandage (dressing).   Tape the tubes to your skin, 1 to 2 inches below the insertion sites, to keep from pulling on your stitches. Tubes are stitched in place and will not slip out.   Pin the bulb to your shirt (not to your pants) with a safety pin.   For the first few days after surgery, there usually is more fluid in the bulb. Empty the bulb whenever it becomes half full because the bulb does not create enough suction if it is too full. Include this amount in your 24 hour totals.   When the amount of drainage decreases, empty the bulb at the same time every day. Write down the amounts and the 24 hour totals. Your caregiver will want to know them. This helps your caregiver know when the tubes can be removed.   (We anticipate removing the drain in 1-3 weeks, depending on when the output is <12m a day for 2+ days)  If there is drainage around the tube sites, change dressings and keep the area dry. If you see a clot in the tube, leave it alone. However, if the tube does not appear to be draining, let your caregiver know.  TO EMPTY THE BULB  Open the stopper to release suction.   Holding the stopper out of the way, pour  drainage into the measuring cup that was sent home with you.   Measure and write down the amount. If there are 2 bulbs, note the amount of drainage from bulb 1 or bulb 2 and keep the totals separate. Your caregiver will want to know which tube is draining more.   Compress the bulb by folding it in half.   Replace the stopper.   Check the tape that holds the tube to your skin, and pin the bulb to your shirt.  SEEK MEDICAL CARE IF:  The drainage develops a bad odor.   You have an oral temperature above 102 F (38.9 C).   The amount of drainage from your wound suddenly increases or decreases.  You accidentally pull out your drain.   You have any other questions or concerns.  MAKE SURE YOU:   Understand these instructions.   Will watch your condition.   Will get help right away if you are not doing well or get worse.     Call our office if you have any questions about your drain. [phone]   GENERAL SURGERY: POST OP INSTRUCTIONS  ######################################################################  EAT Gradually transition to a high fiber diet with a fiber supplement over the next few weeks after discharge.  Start with a pureed / full liquid diet (see below)  WALK Walk an hour a day.  Control your pain to do that.    CONTROL PAIN Control pain so that you can walk, sleep, tolerate sneezing/coughing, go up/down stairs.  HAVE A BOWEL MOVEMENT DAILY Keep your bowels regular to avoid problems.  OK to try a laxative to override constipation.  OK to use an antidairrheal to slow down diarrhea.  Call if not better after 2 tries  CALL IF YOU HAVE PROBLEMS/CONCERNS Call if you are still struggling despite following these instructions. Call if you have concerns not answered by these instructions  ######################################################################    1. DIET: Follow a light bland diet the first 24 hours after arrival home, such as soup, liquids,  crackers, etc.  Be sure to include lots of fluids daily.  Avoid fast food or heavy meals as your are more likely to get nauseated.   2. Take your usually prescribed home medications unless otherwise directed. 3. PAIN CONTROL: a. Pain is best controlled by a usual combination of three different methods TOGETHER: i. Ice/Heat ii. Over the counter pain medication iii. Prescription pain medication b. Most patients will experience some swelling and bruising around the incisions.  Ice packs or heating pads (30-60 minutes up to 6 times a day) will help. Use ice for the first few days to help decrease swelling and bruising, then switch to heat to help relax tight/sore spots and speed recovery.  Some people prefer to use ice alone, heat alone, alternating between ice & heat.  Experiment to what works for you.  Swelling and bruising can take several weeks to resolve.   c. It is helpful to take an over-the-counter pain medication regularly for the first few weeks.  Choose one of the following that works best for you: i. Naproxen (Aleve, etc)  Two 220mg  tabs twice a day ii. Ibuprofen (Advil, etc) Three 200mg  tabs four times a day (every meal & bedtime) iii. Acetaminophen (Tylenol, etc) 500-650mg  four times a day (every meal & bedtime) d. A  prescription for pain medication (such as oxycodone, hydrocodone, etc) should be given to you upon discharge.  Take your pain medication as prescribed.  i. If you are having problems/concerns with the prescription medicine (does not control pain, nausea, vomiting, rash, itching, etc), please call us [phone] to see if we need to switch you to a different pain medicine that will work better for you and/or control your side effect better. ii. If you need a refill on your pain medication, please contact your pharmacy.  They will contact our office to request authorization. Prescriptions will not be filled after 5 pm or on week-ends. 4. Avoid getting constipated.  Between the  surgery and the pain medications, it is common to experience some constipation.  Increasing fluid intake and taking a fiber supplement (such as Metamucil, Citrucel, FiberCon, MiraLax, etc) 1-2 times a day regularly will usually help prevent this  problem from occurring.  A mild laxative (prune juice, Milk of Magnesia, MiraLax, etc) should be taken according to package directions if there are no bowel movements after 48 hours.   5. Wash / shower every day.  You may shower over the dressings as they are waterproof.  Continue to shower over incision(s) after the dressing is off. 6. Remove your waterproof bandages 5 days after surgery.  You may leave the incision open to air.  You may have skin tapes (Steri Strips) covering the incision(s).  Leave them on until one week, then remove.  You may replace a dressing/Band-Aid to cover the incision for comfort if you wish.      7. ACTIVITIES as tolerated:   a. You may resume regular (light) daily activities beginning the next day--such as daily self-care, walking, climbing stairs--gradually increasing activities as tolerated.  If you can walk 30 minutes without difficulty, it is safe to try more intense activity such as jogging, treadmill, bicycling, low-impact aerobics, swimming, etc. b. Save the most intensive and strenuous activity for last such as sit-ups, heavy lifting, contact sports, etc  Refrain from any heavy lifting or straining until you are off narcotics for pain control.   c. DO NOT PUSH THROUGH PAIN.  Let pain be your guide: If it hurts to do something, don't do it.  Pain is your body warning you to avoid that activity for another week until the pain goes down. d. You may drive when you are no longer taking prescription pain medication, you can comfortably wear a seatbelt, and you can safely maneuver your car and apply brakes. e. Dennis Bast may have sexual intercourse when it is comfortable.  8. FOLLOW UP in our office a. Please call [facility] at [phone]  to set up an appointment to see your surgeon in the office for a follow-up appointment approximately 2-3 weeks after your surgery. b. Make sure that you call for this appointment the day you arrive home to insure a convenient appointment time. 9. IF YOU HAVE DISABILITY OR FAMILY LEAVE FORMS, BRING THEM TO THE OFFICE FOR PROCESSING.  DO NOT GIVE THEM TO YOUR DOCTOR.   WHEN TO CALL us [phone]: 1. Poor pain control 2. Reactions / problems with new medications (rash/itching, nausea, etc)  3. Fever over 101.5 F (38.5 C) 4. Worsening swelling or bruising 5. Continued bleeding from incision. 6. Increased pain, redness, or drainage from the incision 7. Difficulty breathing / swallowing   The clinic staff is available to answer your questions during regular business hours (8:30am-5pm).  Please dont hesitate to call and ask to speak to one of our nurses for clinical concerns.   If you have a medical emergency, go to the nearest emergency room or call 911.  A surgeon from [facility] is always on call at the [facility], [place], [place], [place],   [id number] ? MAIN: [phone] ? TOLL FREE: [phone] ?  FAX [phone] [email or web address]

## 2018-11-02 NOTE — Progress Notes (Signed)
Pt took 1,000 mg Tylenol this morning (DOS) at 0500.    Held Tylenol on DOS.

## 2018-11-02 NOTE — Anesthesia Postprocedure Evaluation (Signed)
Anesthesia Post Note  Patient: Catherine [name]  Procedure(s) Performed: REMOVAL OF RIGHT GROIN AND THIGH SUBCUTANEOUS MASSES. (Right Groin)     Patient location during evaluation: PACU Anesthesia Type: General Level of consciousness: awake and alert Pain management: pain level controlled Vital Signs Assessment: post-procedure vital signs reviewed and stable Respiratory status: spontaneous breathing, nonlabored ventilation, respiratory function stable and patient connected to nasal cannula oxygen Cardiovascular status: blood pressure returned to baseline and stable Postop Assessment: no apparent nausea or vomiting Anesthetic complications: no Comments: Patient complained of chest heaviness in PACU following surgery. She states that the pain was similar to that following her last surgery in 11/19. She has not had any other episodes of this pain since her last surgery.ECG done in PACU was normal. Pain resolved entirely and she was discharged home.  [name]    Last Vitals:  Vitals:   11/02/18 1100 11/02/18 1105  BP:    Pulse: 71   Resp: 12   Temp:  (!) 36.3 C  SpO2: 99%     Last Pain:  Vitals:   11/02/18 1015  TempSrc:   PainSc: 6                  [name]

## 2018-11-03 ENCOUNTER — Encounter (HOSPITAL_COMMUNITY): Payer: Self-pay | Admitting: Surgery

## 2018-11-03 ENCOUNTER — Other Ambulatory Visit: Payer: Self-pay | Admitting: *Deleted

## 2018-11-03 DIAGNOSIS — I712 Thoracic aortic aneurysm, without rupture, unspecified: Secondary | ICD-10-CM

## 2018-11-03 LAB — ACID FAST SMEAR (AFB, MYCOBACTERIA): Acid Fast Smear: NEGATIVE

## 2018-11-04 ENCOUNTER — Encounter (HOSPITAL_COMMUNITY): Admission: RE | Payer: Self-pay | Source: Home / Self Care

## 2018-11-04 ENCOUNTER — Ambulatory Visit (HOSPITAL_COMMUNITY): Admission: RE | Admit: 2018-11-04 | Payer: PPO | Source: Home / Self Care | Admitting: Surgery

## 2018-11-04 SURGERY — EXCISION MASS LOWER EXTREMITIES
Anesthesia: General | Laterality: Right

## 2018-11-05 LAB — ACID FAST SMEAR (AFB)

## 2018-11-05 LAB — ACID FAST SMEAR (AFB, MYCOBACTERIA): Acid Fast Smear: NEGATIVE

## 2018-11-06 LAB — AEROBIC/ANAEROBIC CULTURE W GRAM STAIN (SURGICAL/DEEP WOUND)
Culture: NORMAL
Gram Stain: NONE SEEN

## 2018-11-18 ENCOUNTER — Telehealth: Payer: Self-pay | Admitting: Infectious Disease

## 2018-11-18 NOTE — Telephone Encounter (Signed)
Received call from Dr. Michael Boston  Patient has a NTM infection and needs to be seen in person vs EVISIT as NEW CONSULT  Can we look at a few times on my schedule next week?, Monday if possible

## 2018-11-23 ENCOUNTER — Encounter: Payer: Self-pay | Admitting: Infectious Disease

## 2018-11-23 ENCOUNTER — Other Ambulatory Visit: Payer: Self-pay

## 2018-11-23 ENCOUNTER — Ambulatory Visit (INDEPENDENT_AMBULATORY_CARE_PROVIDER_SITE_OTHER): Payer: PPO | Admitting: Infectious Disease

## 2018-11-23 VITALS — Wt 164.0 lb

## 2018-11-23 DIAGNOSIS — R1907 Generalized intra-abdominal and pelvic swelling, mass and lump: Secondary | ICD-10-CM | POA: Diagnosis not present

## 2018-11-23 DIAGNOSIS — M6748 Ganglion, other site: Secondary | ICD-10-CM | POA: Diagnosis not present

## 2018-11-23 DIAGNOSIS — F419 Anxiety disorder, unspecified: Secondary | ICD-10-CM

## 2018-11-23 DIAGNOSIS — M67451 Ganglion, right hip: Secondary | ICD-10-CM

## 2018-11-23 DIAGNOSIS — I889 Nonspecific lymphadenitis, unspecified: Secondary | ICD-10-CM

## 2018-11-23 DIAGNOSIS — R1909 Other intra-abdominal and pelvic swelling, mass and lump: Secondary | ICD-10-CM

## 2018-11-23 DIAGNOSIS — R300 Dysuria: Secondary | ICD-10-CM | POA: Diagnosis not present

## 2018-11-23 DIAGNOSIS — A318 Other mycobacterial infections: Secondary | ICD-10-CM

## 2018-11-23 DIAGNOSIS — N3010 Interstitial cystitis (chronic) without hematuria: Secondary | ICD-10-CM

## 2018-11-23 DIAGNOSIS — L049 Acute lymphadenitis, unspecified: Secondary | ICD-10-CM

## 2018-11-23 HISTORY — DX: Ganglion, right hip: M67.451

## 2018-11-23 HISTORY — DX: Ganglion, other site: M67.48

## 2018-11-23 HISTORY — DX: Other mycobacterial infections: A31.8

## 2018-11-23 HISTORY — DX: Acute lymphadenitis, unspecified: L04.9

## 2018-11-23 NOTE — Progress Notes (Signed)
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 Michael Boston   Subjective:    Patient ID: Catherine [name], female    DOB: 1945-10-08, 73 y.o.   MRN: [id number]  HPI  Catherine [name] is a quite pleasant 73 year old Caucasian female with a past medical history examined for thoracic aneurysm, anxiety, social cystitis who in 2000 1617 developed a cystic lesion in her right groin.  She had this excised by general surgery on June 17 and pathology came back consistent with a lymphangioma and benign lymph nodes.  She then later underwent excision and repair of a pelvic mass which turned out on pathology be consistent with a synovial cyst, and also underwent bilateral femoral hernia repair and left inguinal hernia repair in June 2019.  In November 2019 she developed a proximal thigh mass which was excised and also found to be consistent with a synovial cyst and benign lymph nodes.  She had worsening swelling in her right groin and underwent incision and drainage of what appeared to be a seroma on September 20, 2018.  Postoperatively in the ensuing week she had worsening erythema and swelling and drainage.  She also developed overt fevers and malaise and was started on doxycycline with resolution of her fevers and malaise.  However just prior to surgery she was having worsening erythema in this area and started on doxycycline without any improvement in her symptoms.  She underwent right groin and inner thigh exploration with excision of subcutaneous lymph nodes on November 02, 2018 in the OR there was some yellow cheesy material found consistent with necrosis.  Pathology report showed necrotizing granulomatous inflammation in the soft tissue as well as the 3 lymph nodes that were excised.  Stains were negative but cultures have subsequently grown nontuberculous mycobacteria and ensuing few weeks.   This nontuberculous mycobacteria was probe not just for TB but M gordonae I and M avium and kansasii I and none of these organisms were present.  This is undoubtedly a "rapid grower in either Mycobacterium Chelonae, Abscessus or Fortuitum.  Not yet have formal identification nor do we have susceptibilities.      Past Medical History:  Diagnosis Date  . Anxiety   . Arthritis    HANDS  . Chronic constipation   . Complication of anesthesia    upon waking up from anesthesia patient experienced chest pain - EKG done was negative, patient states "it may have been indigestion"  . COPD (chronic obstructive pulmonary disease) ([place])   . DDD (degenerative disc disease), lumbar    L2-3/3-4/4-5  . Dysuria   . Frequency of urination   . Ganglion cyst of right groin 11/23/2018  . GERD (gastroesophageal reflux disease)   . Hypertension   . Migraine   . 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 Osteopenia 2019   TO OSTEOPOROSIS   . Pelvic pain in female   . Smokers' cough ([place])   . Thoracic aortic aneurysm (TAA) ([place])   . Urgency of urination    h/o INTERSTIAL CYSTITIS...DR. HUMPHRIES/GRAPEY  . Wears glasses     Past Surgical History:  Procedure Laterality Date  . ABDOMINAL HYSTERECTOMY  1982   W/  UNILATERAL SALPINGOOPHORECTOMY  . bilateral hernia surgery      removed ganglion cyst and swollen lymph node   . CHOLECYSTECTOMY OPEN  1987   W/  APPENDECTOMY  . COLONOSCOPY  2002  &  12-2012 & 03/19/15  .  CYSTO WITH HYDRODISTENSION N/A 02/20/2014   Procedure: Daphne AND MARCAINE INSTILLATION;  Surgeon: Bernestine Amass, MD;  Location: [facility];  Service: Urology;  Laterality: N/A;  . EXCISION MASS LOWER EXTREMETIES Right 11/02/2018   Procedure: REMOVAL OF RIGHT GROIN AND THIGH SUBCUTANEOUS MASSES.;  Surgeon: Michael Boston, MD;  Location: [facility];  Service: General;  Laterality: Right;  . EYE SURGERY     bilateral cataract surgery   .  LAPAROSCOPIC UNILATERAL SALPINGOOPHORECTOMY  1984  . MASS EXCISION Right 06/16/2018   Procedure: REMOVAL OF RIGHT GROIN SUBCUTANEOUS MASS ERAS PATHWAY;  Surgeon: Michael Boston, MD;  Location: [facility];  Service: General;  Laterality: Right;  . removal of lymph node cyst in right groin     . ROTATOR CUFF REPAIR Right 2003   and BONE SPUR    Family History  Problem Relation Age of Onset  . Esophageal cancer Mother 34  . Lung cancer Father 56      Social History   Socioeconomic History  . Marital status: Married    Spouse name: Not on file  . Number of children: Not on file  . Years of education: Not on file  . Highest education level: Not on file  Occupational History  . Not on file  Social Needs  . Financial resource strain: Not on file  . Food insecurity:    Worry: Not on file    Inability: Not on file  . Transportation needs:    Medical: Not on file    Non-medical: Not on file  Tobacco Use  . Smoking status: Former Smoker    Packs/day: 0.75    Years: 48.00    Pack years: 36.00    Types: Cigarettes    Last attempt to quit: 09/04/2017    Years since quitting: 1.2  . Smokeless tobacco: Never Used  Substance and Sexual Activity  . Alcohol use: No  . Drug use: No  . Sexual activity: Not on file  Lifestyle  . Physical activity:    Days per week: Not on file    Minutes per session: Not on file  . Stress: Not on file  Relationships  . Social connections:    Talks on phone: Not on file    Gets together: Not on file    Attends religious service: Not on file    Active member of club or organization: Not on file    Attends meetings of clubs or organizations: Not on file    Relationship status: Not on file  Other Topics Concern  . Not on file  Social History Narrative  . Not on file    Allergies  Allergen Reactions  . Ciprofloxacin Hcl Other (See Comments)    Aortic aneurysm per MD  . Metoprolol Other (See Comments) and Cough    and wheezing  . Penicillins  Shortness Of Breath, Rash and Other (See Comments)    Has patient had a PCN reaction causing immediate rash, facial/tongue/throat swelling, SOB or lightheadedness with hypotension: Yes Has patient had a PCN reaction causing severe rash involving mucus membranes or skin necrosis: No Has patient had a PCN reaction that required hospitalization: Yes - MD office Has patient had a PCN reaction occurring within the last 10 years: No If all of the above answers are "NO", then may proceed with Cephalosporin use.   . Advair Diskus [Fluticasone-Salmeterol] Anxiety  . Sulfa Antibiotics Rash     Current Outpatient Medications:  .  acetaminophen (TYLENOL)  500 MG tablet, Take 1,000 mg by mouth at bedtime., Disp: , Rfl:  .  albuterol (ACCUNEB) 0.63 MG/3ML nebulizer solution, Take 1 ampule by nebulization 3 (three) times daily as needed for wheezing., Disp: , Rfl:  .  albuterol (PROVENTIL HFA;VENTOLIN HFA) 108 (90 Base) MCG/ACT inhaler, Inhale 2 puffs into the lungs every 4 (four) hours as needed for wheezing or shortness of breath., Disp: , Rfl:  .  budesonide-formoterol (SYMBICORT) 160-4.5 MCG/ACT inhaler, Inhale 2 puffs into the lungs 2 (two) times daily., Disp: , Rfl:  .  calcium elemental as carbonate (BARIATRIC TUMS ULTRA) 400 MG chewable tablet, Chew 2 tablets by mouth daily. , Disp: , Rfl:  .  carboxymethylcellulose (REFRESH PLUS) 0.5 % SOLN, Place 2 drops into both eyes 2 (two) times daily., Disp: , Rfl:  .  Carboxymethylcellulose Sodium (REFRESH LIQUIGEL) 1 % GEL, Place 1 drop into both eyes at bedtime., Disp: , Rfl:  .  Cholecalciferol (VITAMIN D3) 5000 units CAPS, Take 5,000 Units by mouth daily. , Disp: , Rfl:  .  Cyanocobalamin (VITAMIN B-12 PO), Take 2,500 mcg by mouth daily. , Disp: , Rfl:  .  denosumab (PROLIA) 60 MG/ML SOSY injection, Inject 60 mg into the skin every 6 (six) months., Disp: , Rfl:  .  diltiazem (TAZTIA XT) 360 MG 24 hr capsule, Take 360 mg by mouth daily., Disp: , Rfl:  .   esomeprazole (NEXIUM) 20 MG capsule, Take 20 mg by mouth daily., Disp: , Rfl:  .  fluticasone (FLONASE) 50 MCG/ACT nasal spray, Place 1 spray into both nostrils daily. , Disp: , Rfl:  .  gabapentin (NEURONTIN) 100 MG capsule, Take 100 mg by mouth daily as needed (pain)., Disp: , Rfl:  .  montelukast (SINGULAIR) 10 MG tablet, Take 10 mg by mouth at bedtime. , Disp: , Rfl:  .  naproxen sodium (ALEVE) 220 MG tablet, Take 440 mg by mouth every morning., Disp: , Rfl:  .  polyethylene glycol (MIRALAX / GLYCOLAX) packet, Take 17 g by mouth daily., Disp: , Rfl:  .  sucralfate (CARAFATE) 1 g tablet, Take 1 g by mouth 4 (four) times daily -  with meals and at bedtime., Disp: , Rfl:  .  SUMAtriptan (IMITREX) 25 MG tablet, Take 25 mg by mouth every 2 (two) hours as needed for migraine or headache. May repeat in 2 hours if headache persists or recurs., Disp: , Rfl:  .  tiotropium (SPIRIVA HANDIHALER) 18 MCG inhalation capsule, Place 18 mcg into inhaler and inhale daily. , Disp: , Rfl:  .  traMADol (ULTRAM) 50 MG tablet, Take 50 mg by mouth every 6 (six) hours as needed for moderate pain., Disp: , Rfl:  .  docusate sodium (COLACE) 100 MG capsule, Take 200 mg by mouth daily. , Disp: , Rfl:  .  doxycycline (DORYX) 100 MG EC tablet, Take 100 mg by mouth 2 (two) times daily., Disp: , Rfl:  .  HYDROcodone-acetaminophen (NORCO) 5-325 MG tablet, Take 1-2 tablets by mouth every 6 (six) hours as needed for moderate pain or severe pain., Disp: 30 tablet, Rfl: 0 .  LORazepam (ATIVAN) 1 MG tablet, Take 0.5-1 mg by mouth See admin instructions. Take 0.5 mg by mouth in the morning and take 1 mg by mouth at bedtime, Disp: , Rfl:     Review of Systems  Constitutional: Negative for chills and fever.  HENT: Negative for congestion and sore throat.   Eyes: Negative for photophobia.  Respiratory: Negative for cough, shortness of  breath and wheezing.   Cardiovascular: Negative for chest pain, palpitations and leg swelling.   Gastrointestinal: Positive for abdominal distention and constipation. Negative for abdominal pain, blood in stool, diarrhea, nausea and vomiting.  Genitourinary: Positive for dysuria. Negative for flank pain and hematuria.  Musculoskeletal: Negative for back pain and myalgias.  Skin: Positive for wound. Negative for rash.  Neurological: Negative for dizziness, weakness and headaches.  Hematological: Does not bruise/bleed easily.  Psychiatric/Behavioral: Negative for agitation, behavioral problems, confusion, decreased concentration and suicidal ideas. The patient is nervous/anxious.        Objective:   Physical Exam Vitals signs and nursing note reviewed. Exam conducted with a chaperone present.  Constitutional:      General: She is not in acute distress.    Appearance: She is not diaphoretic.  HENT:     Head: Normocephalic and atraumatic.     Right Ear: External ear normal.     Left Ear: External ear normal.     Nose: Nose normal.     Mouth/Throat:     Pharynx: No oropharyngeal exudate.  Eyes:     General: No scleral icterus.    Conjunctiva/sclera: Conjunctivae normal.     Pupils: Pupils are equal, round, and reactive to light.  Neck:     Musculoskeletal: Normal range of motion and neck supple.  Cardiovascular:     Rate and Rhythm: Normal rate and regular rhythm.     Heart sounds: Normal heart sounds.  Pulmonary:     Effort: Pulmonary effort is normal. No respiratory distress.     Breath sounds: Normal breath sounds. No wheezing.  Abdominal:     General: Bowel sounds are normal. There is distension.     Palpations: Abdomen is soft.     Tenderness: There is no rebound.  Genitourinary:    Exam position: Supine.     Labia:        Right: Tenderness present.   Musculoskeletal: Normal range of motion.        General: No tenderness.  Lymphadenopathy:     Cervical: No cervical adenopathy.     Lower Body: Right inguinal adenopathy present.  Skin:    General: Skin is warm and  dry.     Coloration: Skin is not jaundiced or pale.     Findings: No erythema or rash.  Neurological:     General: No focal deficit present.     Mental Status: She is alert and oriented to person, place, and time.     Coordination: Coordination normal.  Psychiatric:        Mood and Affect: Mood normal.        Behavior: Behavior normal.        Thought Content: Thought content normal.        Judgment: Judgment normal.   She has significant swelling and induration in her groin   She has a wound present is not draining purulent material but she is quite tender in this entire area.  With some edema of her labia and genital area.  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  She has been cautioned against using fluoroquinolones by her cardiothoracic surgeon.  We will however need to balance this against having on effective therapy.  I am hoping that a macrolide can be active and we can you pare this with another agent.  Hopefully will not need to be an intravenous 1.   Dysuria: She complained of this at the end of the visit to the CMA: We will check a UA and culture though I am a bit reluctant to initiate antibiotics in particular since she also carries a diagnosis of interstitial cystitis which would also cause dysuria.  Prevention of novel coronavirus 2019: We will try to minimize visits to doctors offices but I do think a  CT of the abdomen pelvis is indicated and is possible she might need further surgery.  She is otherwise shelter in place.  She does need to get a mask and she says her daughter is trying to obtain a cotton one for her.  I spent greater than 60 minutes with the patient including greater than 50% of time in face to face counsel of the patient the nature of nontuberculous mycobacteria, how they need to be treated with at least 2 active antibiotics and with thorough surgical control of the infection and in coordination of her care.

## 2018-11-24 LAB — URINALYSIS
Bilirubin Urine: NEGATIVE
Glucose, UA: NEGATIVE
Hgb urine dipstick: NEGATIVE
Ketones, ur: NEGATIVE
Leukocytes,Ua: NEGATIVE
Nitrite: NEGATIVE
Protein, ur: NEGATIVE
Specific Gravity, Urine: 1.011 (ref 1.001–1.03)
pH: <=5 (ref 5.0–8.0)

## 2018-11-24 LAB — CBC WITH DIFFERENTIAL/PLATELET
Absolute Monocytes: 580 cells/uL (ref 200–950)
Basophils Absolute: 18 cells/uL (ref 0–200)
Basophils Relative: 0.2 %
Eosinophils Absolute: 74 cells/uL (ref 15–500)
Eosinophils Relative: 0.8 %
HCT: 38.5 % (ref 35.0–45.0)
Hemoglobin: 13.1 g/dL (ref 11.7–15.5)
Lymphs Abs: 1582 cells/uL (ref 850–3900)
MCH: 29.7 pg (ref 27.0–33.0)
MCHC: 34 g/dL (ref 32.0–36.0)
MCV: 87.3 fL (ref 80.0–100.0)
MPV: 9.6 fL (ref 7.5–12.5)
Monocytes Relative: 6.3 %
Neutro Abs: 6946 cells/uL (ref 1500–7800)
Neutrophils Relative %: 75.5 %
Platelets: 367 10*3/uL (ref 140–400)
RBC: 4.41 10*6/uL (ref 3.80–5.10)
RDW: 12.7 % (ref 11.0–15.0)
Total Lymphocyte: 17.2 %
WBC: 9.2 10*3/uL (ref 3.8–10.8)

## 2018-11-24 LAB — BASIC METABOLIC PANEL WITH GFR
BUN: 18 mg/dL (ref 7–25)
CO2: 28 mmol/L (ref 20–32)
Calcium: 9.3 mg/dL (ref 8.6–10.4)
Chloride: 104 mmol/L (ref 98–110)
Creat: 0.77 mg/dL (ref 0.60–0.93)
GFR, Est African American: 89 mL/min/{1.73_m2} (ref >=60)
GFR, Est Non African American: 77 mL/min/{1.73_m2} (ref >=60)
Glucose, Bld: 97 mg/dL (ref 65–99)
Potassium: 4.3 mmol/L (ref 3.5–5.3)
Sodium: 138 mmol/L (ref 135–146)

## 2018-11-24 LAB — URINE CULTURE
MICRO NUMBER:: 411198
SPECIMEN QUALITY:: ADEQUATE

## 2018-11-24 LAB — SEDIMENTATION RATE: Sed Rate: 31 mm/h — ABNORMAL HIGH (ref 0–30)

## 2018-11-24 LAB — C-REACTIVE PROTEIN: CRP: 4.9 mg/L (ref <8.0)

## 2018-11-26 ENCOUNTER — Ambulatory Visit
Admission: RE | Admit: 2018-11-26 | Discharge: 2018-11-26 | Disposition: A | Discharge status: Home / Self Care | Payer: PPO | Source: Ambulatory Visit | Attending: Infectious Disease | Admitting: Infectious Disease

## 2018-11-26 ENCOUNTER — Other Ambulatory Visit: Payer: Self-pay

## 2018-11-26 DIAGNOSIS — R1907 Generalized intra-abdominal and pelvic swelling, mass and lump: Secondary | ICD-10-CM

## 2018-11-26 MED ORDER — IOPAMIDOL (ISOVUE-300) INJECTION 61%
100.0000 mL | Freq: Once | INTRAVENOUS | Status: AC | PRN
  Administered 2018-11-26: 100 mL via INTRAVENOUS

## 2018-11-29 NOTE — Telephone Encounter (Signed)
Pt alerted me to fact hat her test for AF[employer] came back with M fortuitum  We do not have sensitivities but we could use zyvox and a fluoroquinlone though your CT surgeon doe not want [place] to use that clas. We could try doxycyline but it might not be as reliable. Do you want to set up phone visit on Wednesday to discuss?

## 2018-12-01 ENCOUNTER — Encounter: Payer: Self-pay | Admitting: Infectious Disease

## 2018-12-01 ENCOUNTER — Ambulatory Visit (INDEPENDENT_AMBULATORY_CARE_PROVIDER_SITE_OTHER): Payer: PPO | Admitting: Infectious Disease

## 2018-12-01 ENCOUNTER — Other Ambulatory Visit: Payer: Self-pay

## 2018-12-01 DIAGNOSIS — L049 Acute lymphadenitis, unspecified: Secondary | ICD-10-CM

## 2018-12-01 DIAGNOSIS — M7918 Myalgia, other site: Secondary | ICD-10-CM

## 2018-12-01 DIAGNOSIS — M6748 Ganglion, other site: Secondary | ICD-10-CM | POA: Diagnosis not present

## 2018-12-01 DIAGNOSIS — I889 Nonspecific lymphadenitis, unspecified: Secondary | ICD-10-CM

## 2018-12-01 DIAGNOSIS — R1031 Right lower quadrant pain: Secondary | ICD-10-CM

## 2018-12-01 DIAGNOSIS — M5431 Sciatica, right side: Secondary | ICD-10-CM

## 2018-12-01 DIAGNOSIS — M67451 Ganglion, right hip: Secondary | ICD-10-CM

## 2018-12-01 DIAGNOSIS — A318 Other mycobacterial infections: Secondary | ICD-10-CM | POA: Diagnosis not present

## 2018-12-01 LAB — FUNGUS CULTURE WITH STAIN

## 2018-12-01 LAB — FUNGUS CULTURE RESULT

## 2018-12-01 LAB — FUNGAL ORGANISM REFLEX

## 2018-12-01 MED ORDER — DOXYCYCLINE HYCLATE 100 MG PO TABS: 100.0000 mg | ORAL_TABLET | Freq: Two times a day (BID) | ORAL | 0 refills | Status: DC

## 2018-12-01 MED ORDER — SULFAMETHOXAZOLE-TRIMETHOPRIM 400-80 MG PO TABS: 2.0000 | ORAL_TABLET | Freq: Two times a day (BID) | ORAL | 5 refills | Status: DC

## 2018-12-01 NOTE — Progress Notes (Signed)
Virtual Visit via Telephone Note  I connected with Catherine [name] on 12/01/18 at  4:00 PM EDT by telephone and verified that I am speaking with the correct person using two identifiers.  Location: Patient: Home Provider: [facility]   I discussed the limitations, risks, security and privacy concerns of performing an evaluation and management service by telephone and the availability of in person appointments. I also discussed with the patient that there may be a patient responsible charge related to this service. The patient expressed understanding and agreed to proceed.   History of Present Illness:  Catherine [name] is a quite pleasant 73 year old Caucasian female with a past medical history examined for thoracic aneurysm, anxiety, social cystitis who in 2000 1617 developed a cystic lesion in her right groin.  She had this excised by general surgery on June 17 and pathology came back consistent with a lymphangioma and benign lymph nodes.  She then later underwent excision and repair of a pelvic mass which turned out on pathology be consistent with a synovial cyst, and also underwent bilateral femoral hernia repair and left inguinal hernia repair in June 2019.  In November 2019 she developed a proximal thigh mass which was excised and also found to be consistent with a synovial cyst and benign lymph nodes.  She had worsening swelling in her right groin and underwent incision and drainage of what appeared to be a seroma on September 20, 2018.  Postoperatively in the ensuing week she had worsening erythema and swelling and drainage.  She also developed overt fevers and malaise and was started on doxycycline with resolution of her fevers and malaise.  However just prior to surgery she was having worsening erythema in this area and started on doxycycline without any improvement in her symptoms.  She underwent right groin and inner thigh exploration with excision of subcutaneous lymph nodes on November 02, 2018 in  the OR there was some yellow cheesy material found consistent with necrosis.  Pathology report showed necrotizing granulomatous inflammation in the soft tissue as well as the 3 lymph nodes that were excised.  Stains were negative but cultures have subsequently grown nontuberculous mycobacteria and ensuing few weeks.  This nontuberculous mycobacteria was probe not just for TB but M gordonae I and M avium and kansasii I and none of these organisms were present.    When I saw her last we did not have ID or organism. I was bothered by her appearance and concerned for deeper infection. I obtained CT which showed:  IMPRESSION: Fluid collection in the right inguinal region, extending medially into the upper right perineal soft tissues concerning for postoperative abscess, 6.4 x 1.4 cm. Areas of enhancement also noted in the adjacent abductor muscles which on the coronal imaging appear to represent small intramuscular abscesses.  Dr. Johney Maine did not feel this needed urgent surgery and wanted to see what antibiotics could accomplish.  Since then organism ID as M fortuitum but no sensis yet.  I actually do think she will need surgery. She is also c/o erythema and blistering of skin overlying this area.  We discussed options for treatment based on antibiotics statistically likely to be active and weighting these vs those contraindicated or to which she has allergies.   Observations/Objective:  M fortuitum infection of skin with abscess:  Mesh deep tot his hopefully not infected  Sulfa allergy: rash decades ago   Assessment and Plan:  M. Fortuitum: pending sensis being back we will try doxycycline and po bactrim. She  is willing to trial given remoteness of allergy and fact it was not serious ADR  FU w me next week as overbook on Wed and check CBC and BMP  May need to go to zyvox, possibly tedezolid, clofazamine. Would like to avoid IV if possible. Will avoid FQ because of hx of aneurysm  Hx  of aneurysm: avoid FQ  CoVID prevention: shelter in place as much as she can but she may need surgery   Follow Up Instructions:    I discussed the assessment and treatment plan with the patient. The patient was provided an opportunity to ask questions and all were answered. The patient agreed with the plan and demonstrated an understanding of the instructions.   The patient was advised to call back or seek an in-person evaluation if the symptoms worsen or if the condition fails to improve as anticipated.  I provided 30 minutes of non-face-to-face time during this encounter.   Alcide Evener, MD

## 2018-12-07 ENCOUNTER — Telehealth: Payer: Self-pay | Admitting: Infectious Disease

## 2018-12-07 NOTE — Telephone Encounter (Signed)
[id number] Pre-Screening Questions: 12/07/18  Do you currently have a fever (>100 F), chills or unexplained body aches? NO  Are you currently experiencing new cough, shortness of breath, sore throat, runny nose? NO    Have you recently travelled outside the state of [place] in the last 14 days?NO   Have you been in contact with someone that is currently pending confirmation of Covid19 testing or has been confirmed to have the [place] virus? NO  **If the patient answers NO to ALL questions -  advise the patient to please call the clinic before coming to the office should any symptoms develop.

## 2018-12-08 ENCOUNTER — Other Ambulatory Visit: Payer: Self-pay | Admitting: *Deleted

## 2018-12-08 ENCOUNTER — Inpatient Hospital Stay (HOSPITAL_COMMUNITY)
Admission: AD | Admit: 2018-12-08 | Discharge: 2018-12-10 | DRG: 571 | Disposition: A | Discharge status: Home Health | Payer: PPO | Source: Ambulatory Visit | Attending: Internal Medicine | Admitting: Internal Medicine

## 2018-12-08 ENCOUNTER — Telehealth: Payer: Self-pay | Admitting: Surgery

## 2018-12-08 ENCOUNTER — Other Ambulatory Visit: Payer: Self-pay | Admitting: Surgery

## 2018-12-08 ENCOUNTER — Encounter: Payer: Self-pay | Admitting: Infectious Disease

## 2018-12-08 ENCOUNTER — Other Ambulatory Visit: Payer: Self-pay

## 2018-12-08 ENCOUNTER — Ambulatory Visit: Payer: Self-pay | Admitting: Surgery

## 2018-12-08 ENCOUNTER — Telehealth: Payer: Self-pay | Admitting: Pharmacist

## 2018-12-08 ENCOUNTER — Encounter (HOSPITAL_COMMUNITY): Payer: Self-pay | Admitting: Orthopedic Surgery

## 2018-12-08 ENCOUNTER — Telehealth: Payer: Self-pay

## 2018-12-08 ENCOUNTER — Encounter: Payer: Self-pay | Admitting: Surgery

## 2018-12-08 ENCOUNTER — Ambulatory Visit (INDEPENDENT_AMBULATORY_CARE_PROVIDER_SITE_OTHER): Payer: PPO | Admitting: Infectious Disease

## 2018-12-08 VITALS — BP 124/72 | HR 85 | Temp 98.3°F | Ht 70.0 in | Wt 167.0 lb

## 2018-12-08 DIAGNOSIS — Z1159 Encounter for screening for other viral diseases: Secondary | ICD-10-CM

## 2018-12-08 DIAGNOSIS — A318 Other mycobacterial infections: Secondary | ICD-10-CM

## 2018-12-08 DIAGNOSIS — G43009 Migraine without aura, not intractable, without status migrainosus: Secondary | ICD-10-CM | POA: Diagnosis not present

## 2018-12-08 DIAGNOSIS — Z87891 Personal history of nicotine dependence: Secondary | ICD-10-CM | POA: Diagnosis not present

## 2018-12-08 DIAGNOSIS — I719 Aortic aneurysm of unspecified site, without rupture: Secondary | ICD-10-CM

## 2018-12-08 DIAGNOSIS — F418 Other specified anxiety disorders: Secondary | ICD-10-CM | POA: Diagnosis present

## 2018-12-08 DIAGNOSIS — L03314 Cellulitis of groin: Secondary | ICD-10-CM | POA: Diagnosis present

## 2018-12-08 DIAGNOSIS — M5136 Other intervertebral disc degeneration, lumbar region: Secondary | ICD-10-CM | POA: Diagnosis present

## 2018-12-08 DIAGNOSIS — L02419 Cutaneous abscess of limb, unspecified: Secondary | ICD-10-CM | POA: Diagnosis not present

## 2018-12-08 DIAGNOSIS — I889 Nonspecific lymphadenitis, unspecified: Secondary | ICD-10-CM | POA: Diagnosis not present

## 2018-12-08 DIAGNOSIS — Z9049 Acquired absence of other specified parts of digestive tract: Secondary | ICD-10-CM

## 2018-12-08 DIAGNOSIS — I712 Thoracic aortic aneurysm, without rupture: Secondary | ICD-10-CM | POA: Diagnosis not present

## 2018-12-08 DIAGNOSIS — K219 Gastro-esophageal reflux disease without esophagitis: Secondary | ICD-10-CM | POA: Diagnosis present

## 2018-12-08 DIAGNOSIS — B9689 Other specified bacterial agents as the cause of diseases classified elsewhere: Secondary | ICD-10-CM | POA: Diagnosis present

## 2018-12-08 DIAGNOSIS — L089 Local infection of the skin and subcutaneous tissue, unspecified: Secondary | ICD-10-CM | POA: Diagnosis not present

## 2018-12-08 DIAGNOSIS — F419 Anxiety disorder, unspecified: Secondary | ICD-10-CM | POA: Diagnosis not present

## 2018-12-08 DIAGNOSIS — Z801 Family history of malignant neoplasm of trachea, bronchus and lung: Secondary | ICD-10-CM

## 2018-12-08 DIAGNOSIS — T8141XA Infection following a procedure, superficial incisional surgical site, initial encounter: Secondary | ICD-10-CM | POA: Diagnosis not present

## 2018-12-08 DIAGNOSIS — L02415 Cutaneous abscess of right lower limb: Secondary | ICD-10-CM | POA: Diagnosis present

## 2018-12-08 DIAGNOSIS — Z7951 Long term (current) use of inhaled steroids: Secondary | ICD-10-CM

## 2018-12-08 DIAGNOSIS — Z90710 Acquired absence of both cervix and uterus: Secondary | ICD-10-CM

## 2018-12-08 DIAGNOSIS — I714 Abdominal aortic aneurysm, without rupture, unspecified: Secondary | ICD-10-CM

## 2018-12-08 DIAGNOSIS — J449 Chronic obstructive pulmonary disease, unspecified: Secondary | ICD-10-CM | POA: Diagnosis not present

## 2018-12-08 DIAGNOSIS — L02214 Cutaneous abscess of groin: Secondary | ICD-10-CM | POA: Diagnosis present

## 2018-12-08 DIAGNOSIS — Z880 Allergy status to penicillin: Secondary | ICD-10-CM

## 2018-12-08 DIAGNOSIS — Z881 Allergy status to other antibiotic agents status: Secondary | ICD-10-CM

## 2018-12-08 DIAGNOSIS — I96 Gangrene, not elsewhere classified: Secondary | ICD-10-CM | POA: Diagnosis not present

## 2018-12-08 DIAGNOSIS — Z791 Long term (current) use of non-steroidal anti-inflammatories (NSAID): Secondary | ICD-10-CM

## 2018-12-08 DIAGNOSIS — I10 Essential (primary) hypertension: Secondary | ICD-10-CM | POA: Diagnosis not present

## 2018-12-08 DIAGNOSIS — Z882 Allergy status to sulfonamides status: Secondary | ICD-10-CM

## 2018-12-08 DIAGNOSIS — Z79891 Long term (current) use of opiate analgesic: Secondary | ICD-10-CM

## 2018-12-08 DIAGNOSIS — L03115 Cellulitis of right lower limb: Secondary | ICD-10-CM | POA: Diagnosis not present

## 2018-12-08 DIAGNOSIS — H9190 Unspecified hearing loss, unspecified ear: Secondary | ICD-10-CM | POA: Diagnosis present

## 2018-12-08 DIAGNOSIS — M51369 Other intervertebral disc degeneration, lumbar region without mention of lumbar back pain or lower extremity pain: Secondary | ICD-10-CM | POA: Diagnosis present

## 2018-12-08 DIAGNOSIS — L0889 Other specified local infections of the skin and subcutaneous tissue: Secondary | ICD-10-CM | POA: Diagnosis not present

## 2018-12-08 DIAGNOSIS — M810 Age-related osteoporosis without current pathological fracture: Secondary | ICD-10-CM | POA: Diagnosis present

## 2018-12-08 DIAGNOSIS — G43909 Migraine, unspecified, not intractable, without status migrainosus: Secondary | ICD-10-CM | POA: Diagnosis not present

## 2018-12-08 DIAGNOSIS — Z800 Family history of malignant neoplasm of digestive organs: Secondary | ICD-10-CM

## 2018-12-08 DIAGNOSIS — Z79899 Other long term (current) drug therapy: Secondary | ICD-10-CM

## 2018-12-08 DIAGNOSIS — F329 Major depressive disorder, single episode, unspecified: Secondary | ICD-10-CM | POA: Diagnosis present

## 2018-12-08 DIAGNOSIS — Z888 Allergy status to other drugs, medicaments and biological substances status: Secondary | ICD-10-CM

## 2018-12-08 HISTORY — DX: Aortic aneurysm of unspecified site, without rupture: I71.9

## 2018-12-08 LAB — COMPREHENSIVE METABOLIC PANEL
ALT: 28 U/L (ref 0–44)
AST: 26 U/L (ref 15–41)
Albumin: 3.7 g/dL (ref 3.5–5.0)
Alkaline Phosphatase: 93 U/L (ref 38–126)
Anion gap: 10 (ref 5–15)
BUN: 18 mg/dL (ref 8–23)
CO2: 21 mmol/L — ABNORMAL LOW (ref 22–32)
Calcium: 9.1 mg/dL (ref 8.9–10.3)
Chloride: 103 mmol/L (ref 98–111)
Creatinine, Ser: 1.04 mg/dL — ABNORMAL HIGH (ref 0.44–1.00)
GFR calc Af Amer: >60 mL/min (ref >60)
GFR calc non Af Amer: 53 mL/min — ABNORMAL LOW (ref >60)
Glucose, Bld: 93 mg/dL (ref 70–99)
Potassium: 3.8 mmol/L (ref 3.5–5.1)
Sodium: 134 mmol/L — ABNORMAL LOW (ref 135–145)
Total Bilirubin: 0.2 mg/dL — ABNORMAL LOW (ref 0.3–1.2)
Total Protein: 6.9 g/dL (ref 6.5–8.1)

## 2018-12-08 LAB — RAPID GROWER BROTH SUSCEP.
Clarithromycin: >16
Doxycycline: >16
Linezolid: 8
Minocycline: >8
Tigecycline: 0.25

## 2018-12-08 LAB — AFB ORGANISM ID BY DNA PROBE
M avium complex: NEGATIVE
M tuberculosis complex: NEGATIVE

## 2018-12-08 LAB — CBC
HCT: 38.3 % (ref 36.0–46.0)
Hemoglobin: 12 g/dL (ref 12.0–15.0)
MCH: 29.2 pg (ref 26.0–34.0)
MCHC: 31.3 g/dL (ref 30.0–36.0)
MCV: 93.2 fL (ref 80.0–100.0)
Platelets: 263 10*3/uL (ref 150–400)
RBC: 4.11 MIL/uL (ref 3.87–5.11)
RDW: 13.2 % (ref 11.5–15.5)
WBC: 9.1 10*3/uL (ref 4.0–10.5)
nRBC: 0 % (ref 0.0–0.2)

## 2018-12-08 LAB — SURGICAL PCR SCREEN
MRSA, PCR: NEGATIVE
Staphylococcus aureus: NEGATIVE

## 2018-12-08 LAB — ACID FAST CULTURE WITH REFLEXED SENSITIVITIES (MYCOBACTERIA): Acid Fast Culture: POSITIVE — AB

## 2018-12-08 LAB — ORG ID BY SEQUENCING RFLX AST

## 2018-12-08 LAB — SARS CORONAVIRUS 2 BY RT PCR (HOSPITAL ORDER, PERFORMED IN ~~LOC~~ HOSPITAL LAB): SARS Coronavirus 2: NEGATIVE

## 2018-12-08 MED ORDER — CHLORHEXIDINE GLUCONATE CLOTH 2 % EX PADS: 6.0000 | MEDICATED_PAD | Freq: Once | CUTANEOUS | Status: DC

## 2018-12-08 MED ORDER — SODIUM CHLORIDE 0.9 % IV SOLN: INTRAVENOUS | Status: DC

## 2018-12-08 MED ORDER — UMECLIDINIUM BROMIDE 62.5 MCG/INH IN AEPB
1.0000 | INHALATION_SPRAY | Freq: Every day | RESPIRATORY_TRACT | Status: DC
  Filled 2018-12-08: qty 7

## 2018-12-08 MED ORDER — ONDANSETRON HCL 4 MG/2ML IJ SOLN: 4.0000 mg | Freq: Four times a day (QID) | INTRAMUSCULAR | Status: DC | PRN

## 2018-12-08 MED ORDER — SUCRALFATE 1 G PO TABS
1.0000 g | ORAL_TABLET | Freq: Three times a day (TID) | ORAL | Status: DC
  Administered 2018-12-10: 11:00:00 1 g via ORAL
  Filled 2018-12-08 (×3): qty 1

## 2018-12-08 MED ORDER — LORAZEPAM 1 MG PO TABS
1.0000 mg | ORAL_TABLET | Freq: Every day | ORAL | Status: DC
  Administered 2018-12-08 – 2018-12-09 (×2): 1 mg via ORAL
  Filled 2018-12-08 (×2): qty 1

## 2018-12-08 MED ORDER — SODIUM CHLORIDE 0.9% FLUSH: 3.0000 mL | Freq: Two times a day (BID) | INTRAVENOUS | Status: DC

## 2018-12-08 MED ORDER — LINEZOLID 600 MG PO TABS: 600.0000 mg | ORAL_TABLET | Freq: Two times a day (BID) | ORAL | 5 refills | Status: DC

## 2018-12-08 MED ORDER — NON FORMULARY: 100.0000 mg | Freq: Every day | Status: DC

## 2018-12-08 MED ORDER — ACETAMINOPHEN 500 MG PO TABS
1000.0000 mg | ORAL_TABLET | Freq: Every day | ORAL | Status: DC
  Administered 2018-12-08: 1000 mg via ORAL
  Filled 2018-12-08 (×2): qty 2

## 2018-12-08 MED ORDER — FLUTICASONE PROPIONATE 50 MCG/ACT NA SUSP
1.0000 | Freq: Every day | NASAL | Status: DC
  Filled 2018-12-08: qty 16

## 2018-12-08 MED ORDER — HYDROCODONE-ACETAMINOPHEN 5-325 MG PO TABS: 1.0000 | ORAL_TABLET | Freq: Four times a day (QID) | ORAL | Status: DC | PRN

## 2018-12-08 MED ORDER — POLYVINYL ALCOHOL 1.4 % OP SOLN
2.0000 [drp] | Freq: Two times a day (BID) | OPHTHALMIC | Status: DC
  Administered 2018-12-09 – 2018-12-10 (×2): 2 [drp] via OPHTHALMIC
  Filled 2018-12-08: qty 15

## 2018-12-08 MED ORDER — VITAMIN D 25 MCG (1000 UNIT) PO TABS
5000.0000 [IU] | ORAL_TABLET | Freq: Every day | ORAL | Status: DC
  Filled 2018-12-08: qty 5

## 2018-12-08 MED ORDER — GENTAMICIN SULFATE 40 MG/ML IJ SOLN
5.0000 mg/kg | INTRAVENOUS | Status: DC
  Filled 2018-12-08: qty 9.5

## 2018-12-08 MED ORDER — TIOTROPIUM BROMIDE MONOHYDRATE 18 MCG IN CAPS: 18.0000 ug | ORAL_CAPSULE | Freq: Every day | RESPIRATORY_TRACT | Status: DC

## 2018-12-08 MED ORDER — POLYETHYLENE GLYCOL 3350 17 G PO PACK
17.0000 g | PACK | Freq: Every day | ORAL | Status: DC
  Administered 2018-12-10: 17 g via ORAL
  Filled 2018-12-08: qty 1

## 2018-12-08 MED ORDER — GABAPENTIN 100 MG PO CAPS
100.0000 mg | ORAL_CAPSULE | Freq: Every day | ORAL | Status: DC | PRN
  Administered 2018-12-08: 100 mg via ORAL
  Filled 2018-12-08: qty 1

## 2018-12-08 MED ORDER — NAPROXEN SODIUM 275 MG PO TABS
412.5000 mg | ORAL_TABLET | Freq: Every morning | ORAL | Status: DC
  Administered 2018-12-10: 412.5 mg via ORAL
  Filled 2018-12-08 (×2): qty 2

## 2018-12-08 MED ORDER — CLOFAZIMINE POWD: 100.0000 mg | Freq: Every day | Status: DC

## 2018-12-08 MED ORDER — DILTIAZEM HCL ER COATED BEADS 240 MG PO CP24: 360.0000 mg | ORAL_CAPSULE | Freq: Every day | ORAL | Status: DC

## 2018-12-08 MED ORDER — ACETAMINOPHEN 650 MG RE SUPP: 650.0000 mg | Freq: Four times a day (QID) | RECTAL | Status: DC | PRN

## 2018-12-08 MED ORDER — ALBUTEROL SULFATE 0.63 MG/3ML IN NEBU: 1.0000 | INHALATION_SOLUTION | Freq: Three times a day (TID) | RESPIRATORY_TRACT | Status: DC | PRN

## 2018-12-08 MED ORDER — STUDY - INVESTIGATIONAL MEDICATION
100.0000 mg | Freq: Every day | Status: DC
  Administered 2018-12-08 – 2018-12-09 (×2): 100 mg via ORAL

## 2018-12-08 MED ORDER — DOCUSATE SODIUM 100 MG PO CAPS
200.0000 mg | ORAL_CAPSULE | Freq: Every day | ORAL | Status: DC
  Administered 2018-12-10: 200 mg via ORAL
  Filled 2018-12-08: qty 2

## 2018-12-08 MED ORDER — CARBOXYMETHYLCELLULOSE SODIUM 1 % OP GEL: 1.0000 [drp] | Freq: Every day | OPHTHALMIC | Status: DC

## 2018-12-08 MED ORDER — ACETAMINOPHEN 500 MG PO TABS
1000.0000 mg | ORAL_TABLET | ORAL | Status: AC
  Administered 2018-12-09: 1000 mg via ORAL
  Filled 2018-12-08 (×2): qty 2

## 2018-12-08 MED ORDER — LINEZOLID 600 MG PO TABS
600.0000 mg | ORAL_TABLET | Freq: Two times a day (BID) | ORAL | Status: DC
  Administered 2018-12-08 – 2018-12-10 (×3): 600 mg via ORAL
  Filled 2018-12-08 (×5): qty 1

## 2018-12-08 MED ORDER — NON FORMULARY: 2.0000 | Freq: Every day | Status: DC

## 2018-12-08 MED ORDER — GABAPENTIN 300 MG PO CAPS
300.0000 mg | ORAL_CAPSULE | ORAL | Status: AC
  Administered 2018-12-09: 300 mg via ORAL
  Filled 2018-12-08: qty 1

## 2018-12-08 MED ORDER — CARBOXYMETHYLCELLULOSE SODIUM 0.5 % OP SOLN: 2.0000 [drp] | Freq: Two times a day (BID) | OPHTHALMIC | Status: DC

## 2018-12-08 MED ORDER — MONTELUKAST SODIUM 10 MG PO TABS
10.0000 mg | ORAL_TABLET | Freq: Every day | ORAL | Status: DC
  Filled 2018-12-08 (×2): qty 1

## 2018-12-08 MED ORDER — MOMETASONE FURO-FORMOTEROL FUM 200-5 MCG/ACT IN AERO
2.0000 | INHALATION_SPRAY | Freq: Two times a day (BID) | RESPIRATORY_TRACT | Status: DC
  Filled 2018-12-08: qty 8.8

## 2018-12-08 MED ORDER — ONDANSETRON HCL 4 MG PO TABS: 4.0000 mg | ORAL_TABLET | Freq: Four times a day (QID) | ORAL | Status: DC | PRN

## 2018-12-08 MED ORDER — VITAMIN B-12 1000 MCG PO TABS
2500.0000 ug | ORAL_TABLET | Freq: Every day | ORAL | Status: DC
  Administered 2018-12-10: 2500 ug via ORAL
  Filled 2018-12-08: qty 3

## 2018-12-08 MED ORDER — ALBUTEROL SULFATE (2.5 MG/3ML) 0.083% IN NEBU: 2.5000 mg | INHALATION_SOLUTION | RESPIRATORY_TRACT | Status: DC | PRN

## 2018-12-08 MED ORDER — LORAZEPAM 0.5 MG PO TABS
0.5000 mg | ORAL_TABLET | Freq: Every day | ORAL | Status: DC
  Administered 2018-12-10: 0.5 mg via ORAL
  Filled 2018-12-08: qty 1

## 2018-12-08 MED ORDER — CLINDAMYCIN PHOSPHATE 900 MG/50ML IV SOLN
900.0000 mg | INTRAVENOUS | Status: DC
  Filled 2018-12-08: qty 50

## 2018-12-08 MED ORDER — AMBULATORY NON FORMULARY MEDICATION: 2.0000 | Freq: Every day | 11 refills | Status: DC

## 2018-12-08 MED ORDER — PANTOPRAZOLE SODIUM 40 MG PO TBEC
40.0000 mg | DELAYED_RELEASE_TABLET | Freq: Every day | ORAL | Status: DC
  Administered 2018-12-10: 40 mg via ORAL
  Filled 2018-12-08: qty 1

## 2018-12-08 MED ORDER — ACETAMINOPHEN 325 MG PO TABS: 650.0000 mg | ORAL_TABLET | Freq: Four times a day (QID) | ORAL | Status: DC | PRN

## 2018-12-08 MED ORDER — CALCIUM CARBONATE ANTACID 500 MG PO CHEW
2.0000 | CHEWABLE_TABLET | Freq: Every day | ORAL | Status: DC
  Administered 2018-12-10: 400 mg via ORAL
  Filled 2018-12-08: qty 2

## 2018-12-08 NOTE — Progress Notes (Signed)
Complaint: Worsening edema and drainage from her wounds on her groin with increased pain   Subjective:    Patient ID: Catherine [name], female    DOB: 05-Oct-1945, 73 y.o.   MRN: [id number]  HPI  Catherine [name] is a quite pleasant 73 year old Caucasian female with a past medical history examined for thoracic aneurysm, anxiety, social cystitis who in 2000 1617 developed a cystic lesion in her right groin.  Catherine [name] had this excised by general surgery on June 17 and pathology came back consistent with a lymphangioma and benign lymph nodes.  Catherine [name] then later underwent excision and repair of a pelvic mass which turned out on pathology be consistent with a synovial cyst, and also underwent bilateral femoral hernia repair and left inguinal hernia repair in June 2019.  In November 2019 Catherine [name] developed a proximal thigh mass which was excised and also found to be consistent with a synovial cyst and benign lymph nodes.  Catherine [name] had worsening swelling in her right groin and underwent incision and drainage of what appeared to be a seroma on September 20, 2018.  Postoperatively in the ensuing week Catherine [name] had worsening erythema and swelling and drainage.  Catherine [name] also developed overt fevers and malaise and was started on doxycycline with resolution of her fevers and malaise.  However just prior to surgery Catherine [name] was having worsening erythema in this area and started on doxycycline without any improvement in her symptoms.  Catherine [name] underwent right groin and inner thigh exploration with excision of subcutaneous lymph nodes on November 02, 2018 in the OR there was some yellow cheesy material found consistent with necrosis.  Pathology report showed necrotizing granulomatous inflammation in the soft tissue as well as the 3 lymph nodes that were excised.  Stains were negative but cultures have subsequently grown nontuberculous mycobacteria and ensuing few weeks.  This nontuberculous mycobacteria was probe not just for TB but M gordonae I and M avium and  kansasii I and none of these organisms were present.  Saw her in clinic we do not yet have the identity of the organism.  We held off on initiating antimicrobial therapy.  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  I had an electronic visit with her and we decided to do initiate doxycycline and Bactrim despite her history of sulfa allergies given that these 2 drugs had reasonable probability of being active against her organism.  Catherine [name] had in the interim developed worsening blistering and erythema in her groin.  We also had had a CT scan done which had shown:  Fluid collection in the right inguinal region, extending medially into the upper right perineal soft tissues concerning for postoperative abscess, 6.4 x 1.4 cm. Areas of enhancement also noted in the adjacent abductor muscles which on the coronal imaging appear to represent small intramuscular abscesses.  Time unfortunately Catherine [name] has done worse with worsening drainage which Catherine [name] shows me today.  Catherine [name] has worsening pain in her groin and induration of this area as well as the labia which is somewhat distorted.  Microbiologic sensitivity of come back largely on this Mycobacterium fortuitum.  The susceptibility to clarithromycin has not yet back for this antimicrobial is not predictably reliably active against Mycobacterium or fortuitum.  Choices of antimicrobial therapy are limited as described below.  I feel strongly that Catherine [name] needs an I&D to gain control of this infection surgically and have talked with Dr. Johney Maine and I am arranging inpatient admission to [facility] via the hospitalist service  Past Medical History:  Diagnosis Date  . Anxiety   . Arthritis    HANDS  . Chronic constipation   . Complication of anesthesia    upon waking up from anesthesia patient experienced chest pain - EKG done was negative, patient states "it may have been indigestion"  . COPD (chronic obstructive pulmonary disease) ([place])   .  DDD (degenerative disc disease), lumbar    L2-3/3-4/4-5  . Dysuria   . Frequency of urination   . Ganglion cyst of right groin 11/23/2018  . GERD (gastroesophageal reflux disease)   . Hypertension   . Migraine   . 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 Osteopenia 2019   TO OSTEOPOROSIS   . Pelvic pain in female   . Smokers' cough ([place])   . Thoracic aortic aneurysm (TAA) ([place])   . Urgency of urination    h/o INTERSTIAL CYSTITIS...DR. HUMPHRIES/GRAPEY  . Wears glasses     Past Surgical History:  Procedure Laterality Date  . ABDOMINAL HYSTERECTOMY  1982   W/  UNILATERAL SALPINGOOPHORECTOMY  . bilateral hernia surgery      removed ganglion cyst and swollen lymph node   . CHOLECYSTECTOMY OPEN  1987   W/  APPENDECTOMY  . COLONOSCOPY  2002  &  12-2012 & 03/19/15  . CYSTO WITH HYDRODISTENSION N/A 02/20/2014   Procedure: [place] AND MARCAINE INSTILLATION;  Surgeon: Bernestine Amass, MD;  Location: [facility];  Service: Urology;  Laterality: N/A;  . EXCISION MASS LOWER EXTREMETIES Right 11/02/2018   Procedure: REMOVAL OF RIGHT GROIN AND THIGH SUBCUTANEOUS MASSES.;  Surgeon: Michael Boston, MD;  Location: [facility];  Service: General;  Laterality: Right;  . EYE SURGERY     bilateral cataract surgery   . LAPAROSCOPIC UNILATERAL SALPINGOOPHORECTOMY  1984  . MASS EXCISION Right 06/16/2018   Procedure: REMOVAL OF RIGHT GROIN SUBCUTANEOUS MASS ERAS PATHWAY;  Surgeon: Michael Boston, MD;  Location: [facility];  Service: General;  Laterality: Right;  . removal of lymph node cyst in right groin     . ROTATOR CUFF REPAIR Right 2003   and BONE SPUR    Family History  Problem Relation Age of Onset  . Esophageal cancer Mother 48  . Lung cancer Father 83      Social History   Socioeconomic History  . Marital status: Married    Spouse name: Not on file  . Number of children: Not on file  . Years of education: Not on file  . Highest education  level: Not on file  Occupational History  . Not on file  Social Needs  . Financial resource strain: Not on file  . Food insecurity:    Worry: Not on file    Inability: Not on file  . Transportation needs:    Medical: Not on file    Non-medical: Not on file  Tobacco Use  . Smoking status: Former Smoker    Packs/day: 0.75    Years: 48.00    Pack years: 36.00    Types: Cigarettes    Last attempt to quit: 09/04/2017    Years since quitting: 1.2  . Smokeless tobacco: Never Used  Substance and Sexual Activity  . Alcohol use: No  . Drug use: No  . Sexual activity: Not on file  Lifestyle  . Physical activity:    Days per week: Not on file    Minutes per session: Not on file  . Stress: Not on file  Relationships  . Social connections:  Talks on phone: Not on file    Gets together: Not on file    Attends religious service: Not on file    Active member of club or organization: Not on file    Attends meetings of clubs or organizations: Not on file    Relationship status: Not on file  Other Topics Concern  . Not on file  Social History Narrative  . Not on file    Allergies  Allergen Reactions  . Ciprofloxacin Hcl Other (See Comments)    Aortic aneurysm per MD  . Metoprolol Other (See Comments) and Cough    and wheezing  . Penicillins Shortness Of Breath, Rash and Other (See Comments)    Has patient had a PCN reaction causing immediate rash, facial/tongue/throat swelling, SOB or lightheadedness with hypotension: Yes Has patient had a PCN reaction causing severe rash involving mucus membranes or skin necrosis: No Has patient had a PCN reaction that required hospitalization: Yes - MD office Has patient had a PCN reaction occurring within the last 10 years: No If all of the above answers are "NO", then may proceed with Cephalosporin use.   . Advair Diskus [Fluticasone-Salmeterol] Anxiety  . Sulfa Antibiotics Rash     Current Outpatient Medications:  .  acetaminophen  (TYLENOL) 500 MG tablet, Take 1,000 mg by mouth at bedtime., Disp: , Rfl:  .  albuterol (ACCUNEB) 0.63 MG/3ML nebulizer solution, Take 1 ampule by nebulization 3 (three) times daily as needed for wheezing., Disp: , Rfl:  .  albuterol (PROVENTIL HFA;VENTOLIN HFA) 108 (90 Base) MCG/ACT inhaler, Inhale 2 puffs into the lungs every 4 (four) hours as needed for wheezing or shortness of breath., Disp: , Rfl:  .  budesonide-formoterol (SYMBICORT) 160-4.5 MCG/ACT inhaler, Inhale 2 puffs into the lungs 2 (two) times daily., Disp: , Rfl:  .  calcium elemental as carbonate (BARIATRIC TUMS ULTRA) 400 MG chewable tablet, Chew 2 tablets by mouth daily. , Disp: , Rfl:  .  carboxymethylcellulose (REFRESH PLUS) 0.5 % SOLN, Place 2 drops into both eyes 2 (two) times daily., Disp: , Rfl:  .  Carboxymethylcellulose Sodium (REFRESH LIQUIGEL) 1 % GEL, Place 1 drop into both eyes at bedtime., Disp: , Rfl:  .  Cholecalciferol (VITAMIN D3) 5000 units CAPS, Take 5,000 Units by mouth daily. , Disp: , Rfl:  .  Cyanocobalamin (VITAMIN B-12 PO), Take 2,500 mcg by mouth daily. , Disp: , Rfl:  .  denosumab (PROLIA) 60 MG/ML SOSY injection, Inject 60 mg into the skin every 6 (six) months., Disp: , Rfl:  .  diltiazem (TAZTIA XT) 360 MG 24 hr capsule, Take 360 mg by mouth daily., Disp: , Rfl:  .  docusate sodium (COLACE) 100 MG capsule, Take 200 mg by mouth daily. , Disp: , Rfl:  .  doxycycline (VIBRA-TABS) 100 MG tablet, Take 1 tablet (100 mg total) by mouth 2 (two) times daily., Disp: 20 tablet, Rfl: 0 .  esomeprazole (NEXIUM) 20 MG capsule, Take 20 mg by mouth daily., Disp: , Rfl:  .  fluticasone (FLONASE) 50 MCG/ACT nasal spray, Place 1 spray into both nostrils daily. , Disp: , Rfl:  .  gabapentin (NEURONTIN) 100 MG capsule, Take 100 mg by mouth daily as needed (pain)., Disp: , Rfl:  .  gabapentin (NEURONTIN) 300 MG capsule, , Disp: , Rfl:  .  HYDROcodone-acetaminophen (NORCO) 5-325 MG tablet, Take 1-2 tablets by mouth every 6  (six) hours as needed for moderate pain or severe pain., Disp: 30 tablet, Rfl: 0 .  LORazepam (ATIVAN) 1 MG tablet, Take 0.5-1 mg by mouth See admin instructions. Take 0.5 mg by mouth in the morning and take 1 mg by mouth at bedtime, Disp: , Rfl:  .  montelukast (SINGULAIR) 10 MG tablet, Take 10 mg by mouth at bedtime. , Disp: , Rfl:  .  naproxen sodium (ALEVE) 220 MG tablet, Take 440 mg by mouth every morning., Disp: , Rfl:  .  polyethylene glycol (MIRALAX / GLYCOLAX) packet, Take 17 g by mouth daily., Disp: , Rfl:  .  sucralfate (CARAFATE) 1 g tablet, Take 1 g by mouth 4 (four) times daily -  with meals and at bedtime., Disp: , Rfl:  .  sulfamethoxazole-trimethoprim (BACTRIM) 400-80 MG tablet, Take 2 tablets by mouth 2 (two) times daily for 30 days., Disp: 120 tablet, Rfl: 5 .  SUMAtriptan (IMITREX) 25 MG tablet, Take 25 mg by mouth every 2 (two) hours as needed for migraine or headache. May repeat in 2 hours if headache persists or recurs., Disp: , Rfl:  .  tiotropium (SPIRIVA HANDIHALER) 18 MCG inhalation capsule, Place 18 mcg into inhaler and inhale daily. , Disp: , Rfl:  .  traMADol (ULTRAM) 50 MG tablet, Take 50 mg by mouth every 6 (six) hours as needed for moderate pain., Disp: , Rfl:     Review of Systems  Constitutional: Negative for chills and fever.  HENT: Negative for congestion and sore throat.   Eyes: Negative for photophobia.  Respiratory: Negative for cough, shortness of breath and wheezing.   Cardiovascular: Negative for chest pain, palpitations and leg swelling.  Gastrointestinal: Positive for abdominal distention and constipation. Negative for abdominal pain, blood in stool, diarrhea, nausea and vomiting.  Genitourinary: Negative for dysuria, flank pain and hematuria.  Musculoskeletal: Negative for back pain and myalgias.  Skin: Positive for color change and wound. Negative for rash.  Neurological: Negative for dizziness, weakness and headaches.  Hematological: Positive  for adenopathy. Does not bruise/bleed easily.  Psychiatric/Behavioral: Negative for agitation, behavioral problems, confusion, decreased concentration and suicidal ideas. The patient is nervous/anxious.        Objective:   Physical Exam Vitals signs and nursing note reviewed. Exam conducted with a chaperone present.  Constitutional:      General: Catherine [name] is not in acute distress.    Appearance: Catherine [name] is not diaphoretic.  HENT:     Head: Normocephalic and atraumatic.     Right Ear: External ear normal.     Left Ear: External ear normal.     Nose: Nose normal.     Mouth/Throat:     Pharynx: No oropharyngeal exudate.  Eyes:     General: No scleral icterus.    Conjunctiva/sclera: Conjunctivae normal.     Pupils: Pupils are equal, round, and reactive to light.  Neck:     Musculoskeletal: Normal range of motion and neck supple.  Cardiovascular:     Rate and Rhythm: Normal rate and regular rhythm.     Heart sounds: Normal heart sounds.  Pulmonary:     Effort: Pulmonary effort is normal. No respiratory distress.     Breath sounds: Normal breath sounds. No wheezing.  Abdominal:     General: Bowel sounds are normal. There is distension.     Palpations: Abdomen is soft.     Tenderness: There is no rebound.  Genitourinary:    Exam position: Supine.     Labia:        Right: Tenderness present.   Musculoskeletal: Normal range of motion.  General: No tenderness.  Lymphadenopathy:     Cervical: No cervical adenopathy.     Lower Body: Right inguinal adenopathy present.  Skin:    General: Skin is warm and dry.     Coloration: Skin is not jaundiced or pale.     Findings: No erythema or rash.  Neurological:     General: No focal deficit present.     Mental Status: Catherine [name] is alert and oriented to person, place, and time.     Coordination: Coordination normal.  Psychiatric:        Mood and Affect: Mood normal.        Behavior: Behavior normal.        Thought Content: Thought content  normal.        Judgment: Judgment normal.   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   This is worsening clinically and I feel strongly that we need surgical control of this infection with repeat I and D  I have discussed  the case with Dr. Annie Main gross and he is generously agreed to arrange having her come to the operating room to have I&D under general anesthesia.  Dr. Jacki Cones from [facility] has generously agreed to admit the patient to [facility]  I am on consults there and will check in on her tomorrow as well  With regards to antimicrobial therapy:  There have run and the following problems the organism is resistant to doxycycline and Bactrim intermediate to imipenem and cefoxitin.  It is sensitive to fluoroquinolones but they are contraindicated in individual with aortic aneurysm due to really increased risk of rupture  Amikacin is typically a disaster given the risk for nephrotoxicity and permanent hearing loss and have no interest in giving this.  IV tigecycline is very difficult to tolerate.  To me it seems clear that the rational choices to initiate zyvox  and pare this with clofazimine   we will engage in IND  to get informed consent to obtain clofazimine formally for her and pare this with Zyvox  We had also samples in clinic of clofazimine and I have provided them for her.  Cassie our pharmacist will go over the risks and benefits and herbal consent process over the phone.  After Catherine [name] has had a conversation with Cassie Catherine [name] should be able to initiate clofazimine as well as Zyvox.  Please when Catherine [name] is admitted place order so that Catherine [name] can take her home clofazimine along with the Zyvox that I will prescribe  for her in house.   Aortic aneurysm: avoid FQ  Migraine headache: Action between Zyvox and her triptan.  Will ask pharmacy to comment on this there also is a drug drug interaction with her Ultram that Catherine [name] was taking.  I have discontinued them for now on her medication list  Wtant to initiate antibiotics in particular since Catherine [name] also carries a diagnosis of interstitial cystitis which would also cause dysuria.  I spent greater than 40 minutes with the patient including greater than 50% of time in face to face counsel of the patient the nature of her nontuberculous mycobacterial infection need for surgery and the complexity of antibiotic choices and in coordination of  care with infectious disease pharmacy, general surgery and internal medicine hospitalist service.

## 2018-12-08 NOTE — H&P (Signed)
(  Error: Patient not being admitted by surgery.  Surgery to consult)

## 2018-12-08 NOTE — Telephone Encounter (Signed)
[facility] admissions for request for a bed for [facility] per Dr. Tommy Medal, spoke to [facility].  Dawn agreed to contact [place] and give [place] a call about specifics.      Lenore Cordia, [place]

## 2018-12-08 NOTE — Telephone Encounter (Signed)
Patient saw Dr. Tommy Medal in the office today and was started on clofazimine and linezolid for her mycobacterium fortuitum soft tissue infection.  She was given 2 bottles of clofazimine which will last her 3 months. Called to counsel her on how to take clofazimine and the side effects associated with it.  Advised her to take it with food if she can to help mitigate any nausea that comes with clofazimine. Counseled that it may make her sweat, saliva, tears, urine, and skin a brownish/pink color (usually it does not) but that it is reversible once the medication is stopped if she does experience this.   Also counseled on Zyvox and the potential for thrombocytopenia while taking it.  I advised her that we will be monitoring this closely while she is taking it.  Also advised her that we would be monitoring her liver function on clofazimine.  She is having surgery tomorrow and is worried about taking them on an empty stomach in the morning before surgery.  She wishes to start tomorrow night which I told her was probably fine. Her  Zyvox copay is $90 which she states she has no trouble paying for.  I told her we could get her patient assistance if needed but she declined at this time. She will call with any issues/questions/concerns.

## 2018-12-08 NOTE — H&P (Signed)
History and Physical    Catherine [name] PYK:[id number] DOB: [birth year] DOA: 12/08/2018  PCP: Jonathon Jordan, MD   Patient coming from: ID Dr Tommy Medal office  HPI: Catherine [name] is a 73 y.o. female with medical history significant for COPD, DDD, Aortic aneurysm, anxiety/depression, GERD, hypertension, migraine, osteoporosis/osteopenia presented to [facility] long hospital from infectious disease Dr. Arlyss Queen office for worsening soft tissue infection in the right inguinal area.  Patient complains of soreness in right groin along with blister, open wound. She has associated chills and fatigue but denies any fever, chest pain, nausea, vomiting, abdominal pain, diarrhea or constipation. 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  She has been dealing with worsening pain and swelling and cellulitis right inguinal area, followed by infectious disease.  Has grown Mycobacterium in the wound culture, was seen by ID today initiated on Zyvox and clofazimine and is being planned for incision and drainage in the morning by general surgery so she is being admitted under hospitalist today. Patient denies any travel history, denies any contact with her patient with known COVID 19 or fever etcs.   Review of Systems: All systems were reviewed and were negative except as mentioned in HPI above.   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 HUMPHRIES/GRAPEY   Wears glasses     Past Surgical History:  Procedure Laterality Date   ABDOMINAL HYSTERECTOMY  1982   W/  UNILATERAL SALPINGOOPHORECTOMY   bilateral hernia surgery      removed ganglion cyst and swollen lymph node    CHOLECYSTECTOMY OPEN  1987   W/  APPENDECTOMY   COLONOSCOPY  2002  &  12-2012 & 03/19/15   CYSTO WITH HYDRODISTENSION N/A 02/20/2014   Procedure: [place] AND MARCAINE INSTILLATION;  Surgeon: Bernestine Amass, MD;  Location: [facility];  Service: Urology;  Laterality: N/A;   EXCISION MASS LOWER EXTREMETIES Right 11/02/2018   Procedure: REMOVAL OF RIGHT GROIN AND THIGH SUBCUTANEOUS MASSES.;  Surgeon: Michael Boston, MD;  Location: [place];  Service: General;  Laterality: Right;   EYE SURGERY     bilateral cataract surgery    LAPAROSCOPIC UNILATERAL SALPINGOOPHORECTOMY  1984   MASS EXCISION Right 06/16/2018   Procedure: REMOVAL OF RIGHT GROIN SUBCUTANEOUS MASS ERAS PATHWAY;  Surgeon: Michael Boston, MD;  Location: [facility];  Service: General;  Laterality: Right;   removal of lymph node cyst in right  groin      ROTATOR CUFF REPAIR Right 2003   and BONE SPUR     reports that she quit smoking about 15 months ago. Her smoking use included cigarettes. She has a 36.00 pack-year smoking history. She has never used smokeless tobacco. She reports that she does not drink alcohol or use drugs.  Allergies  Allergen Reactions   Ciprofloxacin Hcl Other (See Comments)    Aortic aneurysm per MD    Metoprolol Other (See Comments) and Cough    and wheezing   Penicillins Shortness Of Breath, Rash and Other (See Comments)    Has patient had a PCN reaction causing immediate rash, facial/tongue/throat swelling, SOB or lightheadedness with hypotension: Yes Has patient had a PCN reaction causing severe rash involving mucus membranes or skin necrosis: No Has patient had a PCN reaction that required hospitalization: Yes - MD office Has patient had a PCN reaction occurring within the last 10 years: No If all of the above answers are "NO", then may proceed with Cephalosporin use.    Advair Diskus [Fluticasone-Salmeterol] Anxiety   Sulfa Antibiotics Rash    Family History  Problem Relation Age of Onset   Esophageal cancer Mother 2   Lung cancer Father 46     Prior to Admission medications   Medication Sig Start Date End Date Taking? Authorizing Provider  acetaminophen (TYLENOL) 500 MG tablet Take 1,000 mg by mouth at bedtime.    [provider]  albuterol (ACCUNEB) 0.63 MG/3ML nebulizer solution Take 1 ampule by nebulization 3 (three) times daily as needed for wheezing.    [provider]  albuterol (PROVENTIL HFA;VENTOLIN HFA) 108 (90 Base) MCG/ACT inhaler Inhale 2 puffs into the lungs every 4 (four) hours as needed for wheezing or shortness of breath.    [provider]  AMBULATORY NON FORMULARY MEDICATION Take 2 capsules by mouth daily. Medication Name: clofazimine 12/08/18   Tommy Medal, Lavell Islam, MD  budesonide-formoterol [facility]) 160-4.5 MCG/ACT inhaler Inhale 2 puffs into the lungs 2 (two) times daily.    [provider]  calcium elemental as carbonate (BARIATRIC TUMS ULTRA) 400 MG chewable tablet Chew 2 tablets by mouth daily.     [provider]  carboxymethylcellulose (REFRESH PLUS) 0.5 % SOLN Place 2 drops into both eyes 2 (two) times daily.    [provider]  Carboxymethylcellulose Sodium (REFRESH LIQUIGEL) 1 % GEL Place  1 drop into both eyes at bedtime.    [provider]  Cholecalciferol (VITAMIN D3) 5000 units CAPS Take 5,000 Units by mouth daily.     [provider]  Cyanocobalamin (VITAMIN B-12 PO) Take 2,500 mcg by mouth daily.     [provider]  denosumab (PROLIA) 60 MG/ML SOSY injection Inject 60 mg into the skin every 6 (six) months.    [provider]  diltiazem (TAZTIA XT) 360 MG 24 hr capsule Take 360 mg by mouth daily.    [provider]  docusate sodium (COLACE) 100 MG capsule Take 200 mg by mouth daily.     [provider]  esomeprazole (NEXIUM) 20 MG capsule Take 20 mg by mouth daily.    [provider]  fluticasone (FLONASE) 50 MCG/ACT nasal spray Place 1 spray into both nostrils daily.     [provider]  gabapentin (NEURONTIN) 100 MG capsule Take 100 mg by mouth daily as needed (pain).    [provider]  gabapentin (NEURONTIN) 300 MG capsule  07/05/18   [provider]  HYDROcodone-acetaminophen (NORCO) 5-325 MG tablet Take 1-2 tablets by mouth every 6 (six) hours as needed for moderate pain or severe pain. 11/02/18   Michael Boston, MD  linezolid (ZYVOX) 600 MG tablet Take 1 tablet (600 mg total) by mouth 2 (two) times daily. 12/08/18   Truman Hayward, MD  LORazepam (ATIVAN) 1 MG tablet Take 0.5-1 mg by mouth See admin instructions. Take 0.5 mg by mouth in the morning and take 1 mg by mouth at bedtime    [provider]  montelukast (SINGULAIR) 10 MG tablet Take 10 mg by mouth at bedtime.     [provider]  naproxen sodium (ALEVE) 220 MG tablet Take 440 mg by mouth every morning.    [provider]  polyethylene glycol (MIRALAX / GLYCOLAX) packet Take 17 g by mouth daily.    [provider]  sucralfate (CARAFATE) 1 g tablet Take 1 g by mouth 4 (four) times daily -  with meals and at bedtime.    [provider]  tiotropium (SPIRIVA HANDIHALER) 18 MCG  inhalation capsule Place 18 mcg into inhaler and inhale daily.     [provider]    Physical Exam: There were no vitals filed for this visit.  Constitutional: NAD, calm, comfortable There were no vitals filed for this visit.  General : Alert awake, chronically sick looking.   Eyes: PERRL, lids and conjunctivae normal ENMT: Mucous membranes are moist. Posterior pharynx clear of any exudate or lesions. Normal dentition.  Neck: Normal, supple, no masses, no thyromegaly Respiratory: Bilaterally clear to auscultation, no wheezing or crackles.Normal respiratory effort.No accessory muscle use.  Cardiovascular: Regular rate and rhythm, no murmurs / rubs / gallops.2+ pedal pulses. No carotid bruits.  Abdomen: Soft, nontender, no masses palpated, bowel sounds present Musculoskeletal:  Rt groin with area of induration, tenderness, erythema but no drainage, 3 open wound present. No joint deformity upper and lower extremities. Good ROM, no contractures. Normal muscle tone.  Skin: No rashes, lesions, ulcers.No induration Neurologic: CN 2-12 grossly intact. Sensation intact, able to move upper and lower extremities with no focal weakness Psychiatric: Normal judgment and insight. Alert and oriented x 3. Normal mood.   Foley Catheter:  Labs on Admission: I have personally reviewed following labs and imaging studies  CBC: No results for input(s): WBC, NEUTROABS, HGB, HCT, MCV, PLT in the last 168 hours. Basic Metabolic Panel: No results for input(s): NA, K, CL, CO2, GLUCOSE, BUN, CREATININE, CALCIUM, MG, PHOS in the last 168 hours. GFR: Estimated Creatinine Clearance: 67.7 mL/min (by C-G formula based on SCr of 0.77 mg/dL). Liver Function Tests: No results for input(s): AST, ALT, ALKPHOS, BILITOT, PROT, ALBUMIN in the last 168 hours. No results for input(s): LIPASE, AMYLASE in the last 168 hours. No results for input(s): AMMONIA in the last 168 hours. Coagulation Profile: No results for  input(s): INR, PROTIME in the last 168 hours. Cardiac Enzymes: No results for input(s): CKTOTAL, CKMB, CKMBINDEX, TROPONINI in the last 168 hours. BNP (last 3 results) No results for input(s): PROBNP in the last 8760 hours. HbA1C: No results for input(s): HGBA1C in the last 72 hours. CBG: No results for input(s): GLUCAP in the last 168 hours. Lipid Profile: No results for input(s): CHOL, HDL, LDLCALC, TRIG, CHOLHDL, LDLDIRECT in the last 72 hours. Thyroid Function Tests: No results for input(s): TSH, T4TOTAL, FREET4, T3FREE, THYROIDAB in the last 72 hours. Anemia Panel: No results for input(s): VITAMINB12, FOLATE, FERRITIN, TIBC, IRON, RETICCTPCT in the  last 72 hours. Urine analysis:    Component Value Date/Time   COLORURINE YELLOW 11/23/2018 [place] 11/23/2018 1052   LABSPEC 1.011 11/23/2018 1052   PHURINE < OR = 5.0 11/23/2018 1052   GLUCOSEU NEGATIVE 11/23/2018 1052   HGBUR NEGATIVE 11/23/2018 [place] 11/23/2018 1052   PROTEINUR NEGATIVE 11/23/2018 1052   NITRITE NEGATIVE 11/23/2018 1052   LEUKOCYTESUR NEGATIVE 11/23/2018 1052    Radiological Exams on Admission: No results found.   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  Patient has failed outpatient antibiotics.  As per ID recommendation will start on clofazimine and Zyvox. general surgery has been consulted for incision drainage in the morning.  Currently patient is hemodynamically stable with good functional status.   Obtain routine labs CBC chemistry panel and screening COVID 19 swab. Reviewed ID note from today.Avoiding tramadol and triptan since starting on Zyvox.  Depression with anxiety: Stable.  DDD/osteoporosis/osteopenia, on 6 monthly Prolia at home.  Continue vitamin D calcium supplementation  COPD not on home oxygen, resume her Symbicort and Spiriva inhaler,  along with bronchodilators.  Her lungs are clear.  Aneurysmal dilatation of ascending aorta: Avoid fluoroquinolones.  Patient has evaluation March of last year w "Aneurysmal dilatation of the ascending thoracic aorta 4.0 cm transverse" and is due to see her cardiologist coming June, currently denies any symptoms of chest pain syncope.    GERD: cont PPI  Hypertension: Monitor blood pressure resume Cardizem  Migraine, stable.  Continue PRN Tylenol holding triptan and tramadol.  Severity of Illness:  * I certify that at the point of admission it is my clinical judgment that the patient will require inpatient hospital care spanning beyond 2 midnights from the point of admission due to high intensity of service, high risk for further deterioration and high frequency of surveillance required.  patient failed outpatient antibiotic has had multiple surgeries and at risk for worsening infection further complication including sepsis, so will need inpatient admission expected length of stay more than 2 midnight  DVT prophylaxis:  SCD Code Status: Full code Family Communication: Admission, patients condition and plan of care including tests being ordered have been discussed with the patient and has verbalized understanding.  Consults called:  ID who will see in am GEN Mikki Santee MD [facility]  If 7PM-7AM, please contact night-coverage [email or web address]   12/08/2018, 4:42 PM

## 2018-12-08 NOTE — Telephone Encounter (Signed)
This patient is a 74 y.o.female   Chief complaint / Reason for evaluation: Recurrent groin masses with worsening cellulitis  The patient returns s/p laparoscopic repair of left inguinal and bilateral femoral hernias with mesh. Right iliac lymph node dissection with excision of iliofemoral canal cystic mass. 01/29/2018.  Incision and drainage June 16, 2018.  Excisional biopsy and closure March 2020.  Cultures and pathology concerning for acid-fast bacilli.  Infectious disease consultation made.  Suspicious for atypical rapid growing acid fast bacilli.  By infectious disease.  Patient with worsening pain swelling and cellulitis.  Cultures growing atypical "rapid growing" acid-fast bacilli.  Been followed closely by infectious disease.  Been placed on antibiotic course but unfortunately has progression.  Call today by Dr. Drucilla Schmidt.  Concern for progression on oral antibiotics.  Planning admission with IV antibiotics and requests surgical consultation for debridement.  We will tentatively posted for tomorrow pending medical clearance to consider incision and drainage and possible debridement.  I will follow-up with the patient 5/7 a.m. to discuss further prior to surgery.  Ideally n.p.o. after midnight.  Tentative preoperative orders pended.  [name], MD, FACS, MASCRS Gastrointestinal and Minimally Invasive Surgery    [address] [place], [place] [address] [phone] Main / Paging [phone] Fax

## 2018-12-09 ENCOUNTER — Inpatient Hospital Stay (HOSPITAL_COMMUNITY): Payer: PPO | Admitting: Anesthesiology

## 2018-12-09 ENCOUNTER — Encounter (HOSPITAL_COMMUNITY)
Admission: AD | Disposition: A | Discharge status: Home Health | Payer: Self-pay | Source: Ambulatory Visit | Attending: Internal Medicine

## 2018-12-09 ENCOUNTER — Telehealth: Payer: Self-pay | Admitting: Pharmacist

## 2018-12-09 ENCOUNTER — Inpatient Hospital Stay: Admission: AD | Admit: 2018-12-09 | Payer: PPO | Source: Other Acute Inpatient Hospital | Admitting: Surgery

## 2018-12-09 DIAGNOSIS — L02419 Cutaneous abscess of limb, unspecified: Secondary | ICD-10-CM

## 2018-12-09 DIAGNOSIS — L02214 Cutaneous abscess of groin: Secondary | ICD-10-CM

## 2018-12-09 DIAGNOSIS — A318 Other mycobacterial infections: Secondary | ICD-10-CM

## 2018-12-09 DIAGNOSIS — B9689 Other specified bacterial agents as the cause of diseases classified elsewhere: Secondary | ICD-10-CM

## 2018-12-09 DIAGNOSIS — L0889 Other specified local infections of the skin and subcutaneous tissue: Secondary | ICD-10-CM

## 2018-12-09 HISTORY — PX: INCISION AND DRAINAGE ABSCESS: SHX5864

## 2018-12-09 SURGERY — INCISION AND DRAINAGE, ABSCESS
Anesthesia: General | Site: Groin

## 2018-12-09 MED ORDER — SODIUM CHLORIDE 0.9 % IV SOLN: 250.0000 mL | INTRAVENOUS | Status: DC | PRN

## 2018-12-09 MED ORDER — GABAPENTIN 100 MG PO CAPS
100.0000 mg | ORAL_CAPSULE | Freq: Three times a day (TID) | ORAL | Status: DC
  Administered 2018-12-09 – 2018-12-10 (×3): 100 mg via ORAL
  Filled 2018-12-09 (×3): qty 1

## 2018-12-09 MED ORDER — LACTATED RINGERS IV SOLN
INTRAVENOUS | Status: DC
  Administered 2018-12-09: 10:00:00 via INTRAVENOUS

## 2018-12-09 MED ORDER — LIDOCAINE 2% (20 MG/ML) 5 ML SYRINGE
INTRAMUSCULAR | Status: AC
  Filled 2018-12-09: qty 5

## 2018-12-09 MED ORDER — SODIUM CHLORIDE 0.9% FLUSH: 3.0000 mL | INTRAVENOUS | Status: DC | PRN

## 2018-12-09 MED ORDER — FENTANYL CITRATE (PF) 100 MCG/2ML IJ SOLN
25.0000 ug | INTRAMUSCULAR | Status: DC | PRN
  Administered 2018-12-09 (×2): 50 ug via INTRAVENOUS

## 2018-12-09 MED ORDER — LIP MEDEX EX OINT
1.0000 "application " | TOPICAL_OINTMENT | Freq: Two times a day (BID) | CUTANEOUS | Status: DC
  Administered 2018-12-09 – 2018-12-10 (×2): 1 via TOPICAL
  Filled 2018-12-09: qty 7

## 2018-12-09 MED ORDER — HYDROMORPHONE HCL 2 MG PO TABS
2.0000 mg | ORAL_TABLET | ORAL | Status: DC | PRN
  Administered 2018-12-09 – 2018-12-10 (×2): 2 mg via ORAL
  Filled 2018-12-09 (×2): qty 1

## 2018-12-09 MED ORDER — VANCOMYCIN HCL 10 G IV SOLR
1500.0000 mg | INTRAVENOUS | Status: DC
  Filled 2018-12-09: qty 1500

## 2018-12-09 MED ORDER — PROPOFOL 10 MG/ML IV BOLUS
INTRAVENOUS | Status: DC | PRN
  Administered 2018-12-09: 130 mg via INTRAVENOUS

## 2018-12-09 MED ORDER — GUAIFENESIN-DM 100-10 MG/5ML PO SYRP: 10.0000 mL | ORAL_SOLUTION | ORAL | Status: DC | PRN

## 2018-12-09 MED ORDER — ONDANSETRON HCL 4 MG/2ML IJ SOLN
INTRAMUSCULAR | Status: DC | PRN
  Administered 2018-12-09: 4 mg via INTRAVENOUS

## 2018-12-09 MED ORDER — ROCURONIUM BROMIDE 10 MG/ML (PF) SYRINGE
PREFILLED_SYRINGE | INTRAVENOUS | Status: AC
  Filled 2018-12-09: qty 10

## 2018-12-09 MED ORDER — FENTANYL CITRATE (PF) 100 MCG/2ML IJ SOLN
INTRAMUSCULAR | Status: AC
  Administered 2018-12-09: 50 ug via INTRAVENOUS
  Filled 2018-12-09: qty 2

## 2018-12-09 MED ORDER — SOD CITRATE-CITRIC ACID 500-334 MG/5ML PO SOLN
15.0000 mL | Freq: Once | ORAL | Status: AC
  Administered 2018-12-09: 15 mL via ORAL

## 2018-12-09 MED ORDER — METHOCARBAMOL 500 MG PO TABS: 1000.0000 mg | ORAL_TABLET | Freq: Four times a day (QID) | ORAL | Status: DC | PRN

## 2018-12-09 MED ORDER — FENTANYL CITRATE (PF) 100 MCG/2ML IJ SOLN
INTRAMUSCULAR | Status: AC
  Filled 2018-12-09: qty 2

## 2018-12-09 MED ORDER — ROCURONIUM BROMIDE 10 MG/ML (PF) SYRINGE
PREFILLED_SYRINGE | INTRAVENOUS | Status: DC | PRN
  Administered 2018-12-09: 100 mg via INTRAVENOUS

## 2018-12-09 MED ORDER — BUPIVACAINE-EPINEPHRINE (PF) 0.25% -1:200000 IJ SOLN
INTRAMUSCULAR | Status: AC
  Filled 2018-12-09: qty 60

## 2018-12-09 MED ORDER — FENTANYL CITRATE (PF) 250 MCG/5ML IJ SOLN
INTRAMUSCULAR | Status: DC | PRN
  Administered 2018-12-09: 100 ug via INTRAVENOUS

## 2018-12-09 MED ORDER — LIDOCAINE 2% (20 MG/ML) 5 ML SYRINGE
INTRAMUSCULAR | Status: DC | PRN
  Administered 2018-12-09: 80 mg via INTRAVENOUS

## 2018-12-09 MED ORDER — METOCLOPRAMIDE HCL 5 MG/ML IJ SOLN: 10.0000 mg | Freq: Once | INTRAMUSCULAR | Status: DC | PRN

## 2018-12-09 MED ORDER — SUGAMMADEX SODIUM 200 MG/2ML IV SOLN
INTRAVENOUS | Status: DC | PRN
  Administered 2018-12-09: 300 mg via INTRAVENOUS

## 2018-12-09 MED ORDER — BUPIVACAINE LIPOSOME 1.3 % IJ SUSP
20.0000 mL | INTRAMUSCULAR | Status: AC
  Filled 2018-12-09: qty 20

## 2018-12-09 MED ORDER — BUPIVACAINE LIPOSOME 1.3 % IJ SUSP
INTRAMUSCULAR | Status: DC | PRN
  Administered 2018-12-09: 20 mL

## 2018-12-09 MED ORDER — HYDROCORTISONE (PERIANAL) 2.5 % EX CREA
1.0000 "application " | TOPICAL_CREAM | Freq: Four times a day (QID) | CUTANEOUS | Status: DC | PRN
  Filled 2018-12-09: qty 28.35

## 2018-12-09 MED ORDER — MAGIC MOUTHWASH
15.0000 mL | Freq: Four times a day (QID) | ORAL | Status: DC | PRN
  Filled 2018-12-09: qty 15

## 2018-12-09 MED ORDER — ONDANSETRON HCL 4 MG/2ML IJ SOLN
INTRAMUSCULAR | Status: AC
  Filled 2018-12-09: qty 2

## 2018-12-09 MED ORDER — PHENOL 1.4 % MT LIQD
1.0000 | OROMUCOSAL | Status: DC | PRN
  Filled 2018-12-09: qty 177

## 2018-12-09 MED ORDER — ALUM & MAG HYDROXIDE-SIMETH 200-200-20 MG/5ML PO SUSP
30.0000 mL | Freq: Four times a day (QID) | ORAL | Status: DC | PRN
  Filled 2018-12-09: qty 30

## 2018-12-09 MED ORDER — SUGAMMADEX SODIUM 200 MG/2ML IV SOLN
INTRAVENOUS | Status: AC
  Filled 2018-12-09: qty 2

## 2018-12-09 MED ORDER — MENTHOL 3 MG MT LOZG: 1.0000 | LOZENGE | OROMUCOSAL | Status: DC | PRN

## 2018-12-09 MED ORDER — DILTIAZEM HCL ER COATED BEADS 240 MG PO CP24
360.0000 mg | ORAL_CAPSULE | Freq: Every day | ORAL | Status: DC
  Administered 2018-12-10: 360 mg via ORAL
  Filled 2018-12-09: qty 1

## 2018-12-09 MED ORDER — BUPIVACAINE-EPINEPHRINE 0.25% -1:200000 IJ SOLN
INTRAMUSCULAR | Status: DC | PRN
  Administered 2018-12-09: 30 mL

## 2018-12-09 MED ORDER — MEPERIDINE HCL 50 MG/ML IJ SOLN: 6.2500 mg | INTRAMUSCULAR | Status: DC | PRN

## 2018-12-09 MED ORDER — LACTATED RINGERS IV BOLUS: 1000.0000 mL | Freq: Three times a day (TID) | INTRAVENOUS | Status: DC | PRN

## 2018-12-09 MED ORDER — DEXAMETHASONE SODIUM PHOSPHATE 10 MG/ML IJ SOLN
INTRAMUSCULAR | Status: AC
  Filled 2018-12-09: qty 1

## 2018-12-09 MED ORDER — HYDROCORTISONE 1 % EX CREA
1.0000 "application " | TOPICAL_CREAM | Freq: Three times a day (TID) | CUTANEOUS | Status: DC | PRN
  Filled 2018-12-09: qty 28

## 2018-12-09 MED ORDER — ACETAMINOPHEN 500 MG PO TABS
1000.0000 mg | ORAL_TABLET | Freq: Three times a day (TID) | ORAL | Status: DC
  Administered 2018-12-09 – 2018-12-10 (×2): 1000 mg via ORAL
  Filled 2018-12-09 (×2): qty 2

## 2018-12-09 MED ORDER — SODIUM CHLORIDE 0.9% FLUSH
3.0000 mL | Freq: Two times a day (BID) | INTRAVENOUS | Status: DC
  Administered 2018-12-09: 3 mL via INTRAVENOUS

## 2018-12-09 MED ORDER — SOD CITRATE-CITRIC ACID 500-334 MG/5ML PO SOLN
ORAL | Status: AC
  Administered 2018-12-09: 11:00:00 15 mL via ORAL
  Filled 2018-12-09: qty 15

## 2018-12-09 MED ORDER — DEXTROSE 5 % IV SOLN: 5.0000 mg/kg | INTRAVENOUS | Status: DC

## 2018-12-09 MED ORDER — DEXAMETHASONE SODIUM PHOSPHATE 10 MG/ML IJ SOLN
INTRAMUSCULAR | Status: DC | PRN
  Administered 2018-12-09: 8 mg via INTRAVENOUS

## 2018-12-09 MED ORDER — PHENYLEPHRINE 40 MCG/ML (10ML) SYRINGE FOR IV PUSH (FOR BLOOD PRESSURE SUPPORT)
PREFILLED_SYRINGE | INTRAVENOUS | Status: DC | PRN
  Administered 2018-12-09 (×4): 40 ug via INTRAVENOUS

## 2018-12-09 SURGICAL SUPPLY — 33 items
BLADE SURG 15 STRL LF DISP TIS (BLADE) ×1 IMPLANT
BLADE SURG 15 STRL SS (BLADE) ×2
BRIEF STRETCH FOR OB PAD LRG (UNDERPADS AND DIAPERS) ×2 IMPLANT
COVER SURGICAL LIGHT HANDLE (MISCELLANEOUS) ×2 IMPLANT
COVER WAND RF STERILE (DRAPES) IMPLANT
DRAPE LAPAROTOMY T 102X78X121 (DRAPES) ×2 IMPLANT
DRSG PAD ABDOMINAL 8X10 ST (GAUZE/BANDAGES/DRESSINGS) ×2 IMPLANT
ELECT PENCIL ROCKER SW 15FT (MISCELLANEOUS) ×2 IMPLANT
ELECT REM PT RETURN 15FT ADLT (MISCELLANEOUS) ×2 IMPLANT
GAUZE 4X4 16PLY RFD (DISPOSABLE) ×2 IMPLANT
GAUZE SPONGE 4X4 12PLY STRL (GAUZE/BANDAGES/DRESSINGS) ×2 IMPLANT
GLOVE ECLIPSE 8.0 STRL XLNG CF (GLOVE) ×2 IMPLANT
GLOVE INDICATOR 8.0 STRL GRN (GLOVE) ×2 IMPLANT
GOWN STRL REUS W/TWL XL LVL3 (GOWN DISPOSABLE) ×4 IMPLANT
KIT BASIN OR (CUSTOM PROCEDURE TRAY) ×2 IMPLANT
KIT TURNOVER KIT A (KITS) IMPLANT
NEEDLE HYPO 22GX1.5 SAFETY (NEEDLE) ×2 IMPLANT
PACK BASIC VI WITH GOWN DISP (CUSTOM PROCEDURE TRAY) ×2 IMPLANT
SUCTION FRAZIER HANDLE 12FR (TUBING)
SUCTION TUBE FRAZIER 12FR DISP (TUBING) IMPLANT
SURGILUBE 2OZ TUBE FLIPTOP (MISCELLANEOUS) ×2 IMPLANT
SUT CHROMIC 2 0 SH (SUTURE) IMPLANT
SUT CHROMIC 3 0 SH 27 (SUTURE) IMPLANT
SUT VIC AB 2-0 UR6 27 (SUTURE) IMPLANT
SWAB COLLECTION DEVICE MRSA (MISCELLANEOUS) IMPLANT
SWAB CULTURE ESWAB REG 1ML (MISCELLANEOUS) IMPLANT
SYR 20CC LL (SYRINGE) ×2 IMPLANT
SYR 3ML LL SCALE MARK (SYRINGE) IMPLANT
SYR BULB IRRIGATION 50ML (SYRINGE) IMPLANT
TAPE CLOTH SURG 4X10 WHT LF (GAUZE/BANDAGES/DRESSINGS) ×1 IMPLANT
TOWEL OR 17X26 10 PK STRL BLUE (TOWEL DISPOSABLE) ×2 IMPLANT
TOWEL OR NON WOVEN STRL DISP B (DISPOSABLE) ×2 IMPLANT
YANKAUER SUCT BULB TIP 10FT TU (MISCELLANEOUS) ×2 IMPLANT

## 2018-12-09 NOTE — Progress Notes (Signed)
Subjective: No new complaints   Antibiotics:  Anti-infectives (From admission, onward)   Start     Dose/Rate Route Frequency Ordered Stop   12/09/18 1030  amikacin (AMIKIN) 375 mg in dextrose 5 % 100 mL IVPB  Status:  Discontinued     5 mg/kg  75.8 kg 101.5 mL/hr over 60 Minutes Intravenous On call to O.R. 12/09/18 7425 12/09/18 1009   12/09/18 1030  vancomycin (VANCOCIN) 1,500 mg in sodium chloride 0.9 % 500 mL IVPB  Status:  Discontinued    Note to Pharmacy:  Pharmacy may adjust dosing strength, interval, or rate of medication as needed for optimal therapy for the patient  Send with patient on call to the OR.  Anesthesia to complete antibiotic administration <41min prior to incision per [facility].   1,500 mg 250 mL/hr over 120 Minutes Intravenous On call to O.R. 12/09/18 9563 12/09/18 1009   12/09/18 0600  clindamycin (CLEOCIN) IVPB 900 mg  Status:  Discontinued     900 mg 100 mL/hr over 30 Minutes Intravenous On call to O.R. 12/08/18 1639 12/09/18 0953   12/09/18 0600  gentamicin (GARAMYCIN) 380 mg in dextrose 5 % 100 mL IVPB  Status:  Discontinued     5 mg/kg  75.8 kg 109.5 mL/hr over 60 Minutes Intravenous On call to O.R. 12/08/18 1639 12/09/18 0953   12/08/18 2000  linezolid (ZYVOX) tablet 600 mg     600 mg Oral 2 times daily 12/08/18 1730        Medications: Scheduled Meds: . acetaminophen  1,000 mg Oral QHS  . acetaminophen  1,000 mg Oral TID  . bupivacaine liposome  20 mL Infiltration On Call to OR  . calcium carbonate  2 tablet Oral Daily  . Chlorhexidine Gluconate Cloth  6 each Topical Once  . cholecalciferol  5,000 Units Oral Daily  . diltiazem (CARDIZEM CD) 360 mg  360 mg Oral Daily  . docusate sodium  200 mg Oral Daily  . fluticasone  1 spray Each Nare Daily  . gabapentin  100 mg Oral TID  . Investigational - Study Medication  100 mg Oral Q supper  . linezolid  600 mg Oral BID  . lip balm  1 application Topical BID  . LORazepam  0.5 mg Oral  Daily  . LORazepam  1 mg Oral QHS  . mometasone-formoterol  2 puff Inhalation BID  . montelukast  10 mg Oral QHS  . naproxen sodium  412.5 mg Oral q morning - 10a  . pantoprazole  40 mg Oral Daily  . polyethylene glycol  17 g Oral Daily  . polyvinyl alcohol  2 drop Both Eyes BID  . sodium chloride flush  3 mL Intravenous Q12H  . sodium chloride flush  3 mL Intravenous Q12H  . sucralfate  1 g Oral TID WC & HS  . umeclidinium bromide  1 puff Inhalation Daily  . vitamin B-12  2,500 mcg Oral Daily   Continuous Infusions: . sodium chloride    . lactated ringers     PRN Meds:.sodium chloride, acetaminophen **OR** acetaminophen, albuterol, alum & mag hydroxide-simeth, hydrocortisone, hydrocortisone cream, HYDROmorphone, lactated ringers, magic mouthwash, menthol-cetylpyridinium, methocarbamol, ondansetron **OR** ondansetron (ZOFRAN) IV, phenol, sodium chloride flush    Objective: Weight change:   Intake/Output Summary (Last 24 hours) at 12/09/2018 1749 Last data filed at 12/09/2018 1513 Gross per 24 hour  Intake 2393 ml  Output 1122 ml  Net 1271 ml   Blood pressure 118/62,  pulse 63, temperature 98.6 F (37 C), temperature source Oral, resp. rate 16, SpO2 99 %. Temp:  [97.3 F (36.3 C)-99.1 F (37.3 C)] 98.6 F (37 C) (05/07 1413) Pulse Rate:  [63-85] 63 (05/07 1513) Resp:  [11-20] 16 (05/07 1513) BP: (116-135)/(58-75) 118/62 (05/07 1513) SpO2:  [95 %-100 %] 99 % (05/07 1513)  Physical Exam: Chaperone present General: Alert and awake, oriented x3, not in any acute distress. HEENT: anicteric sclera, EOMI CVS regular rate, normal  Chest: , no wheezing, no respiratory distress Abdomen: soft non-distended,  Extremities: no edema or deformity noted bilaterally Skin./GU: She has MUCH less distention now that she has I and D and showed me these areas Neuro: nonfocal  CBC:    BMET Recent Labs    12/08/18 1735  NA 134*  K 3.8  CL 103  CO2 21*  GLUCOSE 93  BUN 18   CREATININE 1.04*  CALCIUM 9.1     Liver Panel  Recent Labs    12/08/18 1735  PROT 6.9  ALBUMIN 3.7  AST 26  ALT 28  ALKPHOS 93  BILITOT 0.2*       Sedimentation Rate No results for input(s): ESRSEDRATE in the last 72 hours. C-Reactive Protein No results for input(s): CRP in the last 72 hours.  Micro Results: Recent Results (from the past 720 hour(s))  Urine Culture     Status: None   Collection Time: 11/23/18 10:52 AM  Result Value Ref Range Status   MICRO NUMBER: [id number]  Final   SPECIMEN QUALITY: Adequate  Final   Sample Source URINE  Final   STATUS: FINAL  Final   ISOLATE 1:   Final    Single organism less than 10,000 CFU/mL isolated. These organisms, commonly found on external and internal genitalia, are considered colonizers. No further testing performed.  SARS Coronavirus 2 (CEPHEID - Performed in [place] hospital lab), Hosp Order     Status: None   Collection Time: 12/08/18  5:16 PM  Result Value Ref Range Status   SARS Coronavirus 2 NEGATIVE NEGATIVE Final    Comment: (NOTE) If result is NEGATIVE SARS-CoV-2 target nucleic acids are NOT DETECTED. The SARS-CoV-2 RNA is generally detectable in upper and lower  respiratory specimens during the acute phase of infection. The lowest  concentration of SARS-CoV-2 viral copies this assay can detect is 250  copies / mL. A negative result does not preclude SARS-CoV-2 infection  and should not be used as the sole basis for treatment or other  patient management decisions.  A negative result may occur with  improper specimen collection / handling, submission of specimen other  than nasopharyngeal swab, presence of viral mutation(s) within the  areas targeted by this assay, and inadequate number of viral copies  (<250 copies / mL). A negative result must be combined with clinical  observations, patient history, and epidemiological information. If result is POSITIVE SARS-CoV-2 target nucleic acids are DETECTED.  The SARS-CoV-2 RNA is generally detectable in upper and lower  respiratory specimens dur ing the acute phase of infection.  Positive  results are indicative of active infection with SARS-CoV-2.  Clinical  correlation with patient history and other diagnostic information is  necessary to determine patient infection status.  Positive results do  not rule out bacterial infection or co-infection with other viruses. If result is PRESUMPTIVE POSTIVE SARS-CoV-2 nucleic acids MAY BE PRESENT.   A presumptive positive result was obtained on the submitted specimen  and confirmed on repeat testing.  While  2019 novel coronavirus  (SARS-CoV-2) nucleic acids may be present in the submitted sample  additional confirmatory testing may be necessary for epidemiological  and / or clinical management purposes  to differentiate between  SARS-CoV-2 and other Sarbecovirus currently known to infect humans.  If clinically indicated additional testing with an alternate test  methodology [phone]) is advised. The SARS-CoV-2 RNA is generally  detectable in upper and lower respiratory sp ecimens during the acute  phase of infection. The expected result is Negative. Fact Sheet for Patients:  StrictlyIdeas.no Fact Sheet for Healthcare Providers: BankingDealers.co.za This test is not yet approved or cleared by the [place] FDA and has been authorized for detection and/or diagnosis of SARS-CoV-2 by FDA under an Emergency Use Authorization (EUA).  This EUA will remain in effect (meaning this test can be used) for the duration of the COVID-19 declaration under Section 564(b)(1) of the Act, 21 U.S.C. section 360bbb-3(b)(1), unless the authorization is terminated or revoked sooner. Performed at [facility], [place] [address], [place], [place] [address]   Surgical pcr screen     Status: None   Collection Time: 12/08/18  6:55 PM  Result Value Ref Range  Status   MRSA, PCR NEGATIVE NEGATIVE Final   Staphylococcus aureus NEGATIVE NEGATIVE Final    Comment: (NOTE) The Xpert SA Assay (FDA approved for NASAL specimens in patients 82 years of age and older), is one component of a comprehensive surveillance program. It is not intended to diagnose infection nor to guide or monitor treatment. Performed at [facility], [place] [address] [place], [place] [address]   Aerobic/Anaerobic Culture (surgical/deep wound)     Status: None (Preliminary result)   Collection Time: 12/09/18 11:04 AM  Result Value Ref Range Status   Specimen Description   Final    ABSCESS RIGHT GROIN Performed at [facility], [place] [address], [place], [place] [address]    Special Requests   Final    PATIENT ON FOLLOWING ZYVOX, CLOFAZAMINE Performed at [facility], [place] [address], [place], [place] [address]    Gram Stain   Final    FEW WBC PRESENT,BOTH PMN AND MONONUCLEAR NO ORGANISMS SEEN Performed at [facility], [place] [address] [place], [place] [address]    Culture PENDING  Incomplete   Report Status PENDING  Incomplete  Aerobic/Anaerobic Culture (surgical/deep wound)     Status: None (Preliminary result)   Collection Time: 12/09/18 11:47 AM  Result Value Ref Range Status   Specimen Description   Final    TISSUE RIGHT GROIN Performed at [facility], [place] [address], [place], [place] [address]    Special Requests   Final    PATIENT ON FOLLOWING ZYVOX, CLOFAZAMINE Performed at [facility], [place] [address], [place], [place] [address]    Gram Stain   Final    ABUNDANT WBC PRESENT,BOTH PMN AND MONONUCLEAR NO ORGANISMS SEEN Performed at [facility], [place] [address] [place],  [address]    Culture PENDING  Incomplete   Report Status PENDING  Incomplete    Studies/Results: No results found.    Assessment/Plan:  INTERVAL HISTORY:   Sp I and D by Dr. Johney Maine    Principal Problem:   Soft tissue infection - Mycobaterium fortuitum   Active Problems:   Depression with anxiety   DDD (degenerative disc disease), lumbar   Cellulitis of right groin    Catherine [name] is a 73 y.o.  female with  Groin, thigh absceses due to M fortuitum sp I and D by Dr. Johney Maine  --greatly appreciate Dr. Johney Maine critical intervention here --continue Zyvox and clofazamine  I will reschedule her visit with me for 2 weeks so we can check CBC w diff on Zyvox  OK to DC in am if OK from [facility]     LOS: 1 day   Alcide Evener 12/09/2018, 5:49 PM

## 2018-12-09 NOTE — Progress Notes (Signed)
PROGRESS NOTE    CHAN ROSASCO  KJZ:[id number] DOB: 07/19/1946 DOA: 12/08/2018 PCP: Jonathon Jordan, MD   Brief Narrative:  73 y.o. female with medical history significant for COPD, DDD, Aortic aneurysm, anxiety/depression, GERD, hypertension, migraine, osteoporosis/osteopenia presented to [facility] from infectious disease Dr. Arlyss Queen office for worsening soft tissue infection in the right inguinal area.  Patient complains of soreness in right groin along with blister, open wound. She has associated chills and fatigue but denies any fever, chest pain, nausea, vomiting, abdominal pain, diarrhea or constipation. 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  She has been dealing with worsening pain and swelling and cellulitis right inguinal area, followed by infectious disease.  Has grown Mycobacterium in the wound culture, was seen by ID today initiated on Zyvox and clofazimine and is being planned for incision and drainage in the morning by general surgery so she is being admitted under hospitalist today. Patient denies any travel history, denies any contact with her patient with known COVID 19 or fever etcs.  Subjective: Seen this morning resting comfortably.  she still has ongoing pain in the right groin.  No fever.  Waiting for surgery today.  Assessment & Plan:  Soft tissue infection on right inguinal area with Mycobaterium fortuitum with cellulitis :Patient has failed outpatient antibiotics.  As per ID recommendation cont on clofazimine and Zyvox. general surgery has been consulted and she is going for I and D today.Avoiding tramadol and triptan since starting on Zyvox.  Depression with anxiety: Stable.  DDD/osteoporosis/osteopenia, on 6 monthly  Prolia at home.  Continue vitamin D calcium supplementation  COPD not on home oxygen, stable, continue home symbicort and Spiriva inhaler, along with bronchodilators.  Her lungs are clear.  Aneurysmal dilatation of ascending aorta: Avoid fluoroquinolones.  Patient has evaluation in March of last year w "Aneurysmal dilatation of the ascending thoracic aorta 4.0 cm transverse" and is due to see her cardiologist coming June, currently denies any symptoms of chest pain syncope.    GERD: cont PPI  Hypertension: controlled on home Cardizem, insist on taking this am.  Migraine, stable.  Continue PRN Tylenol holding triptan and tramadol.  DVT prophylaxis: SCD/Heparin/lovenox Code Status:  Family Communication: family at bedside Disposition Plan: remains inpatient pending clinical improvement.  Postprocedure monitor closely obtain pain control and wound care and if remains medically stable anticipate discharge tomorrow.  Consultants:  ID Gen surgery  Procedures:  Antimicrobials: Anti-infectives (From admission, onward)   Start     Dose/Rate Route Frequency Ordered Stop   12/09/18 1030  amikacin (AMIKIN) 375 mg in dextrose 5 % 100 mL IVPB  Status:  Discontinued     5 mg/kg  75.8 kg 101.5 mL/hr over 60 Minutes Intravenous On call to O.R. 12/09/18 9480 12/09/18 1009   12/09/18 1030  vancomycin (VANCOCIN) 1,500 mg in sodium chloride 0.9 % 500 mL IVPB  Status:  Discontinued    Note to Pharmacy:  Pharmacy may adjust dosing strength, interval, or rate of medication as needed for optimal therapy for the patient  Send with patient on call to the OR.  Anesthesia to complete antibiotic administration <44min prior to incision per [facility].   1,500 mg 250 mL/hr over 120 Minutes Intravenous On call to O.R. 12/09/18 1655 12/09/18 1009  12/09/18 0600  clindamycin (CLEOCIN) IVPB 900 mg  Status:  Discontinued     900 mg 100 mL/hr over 30 Minutes Intravenous On call to O.R. 12/08/18 1639  12/09/18 0953   12/09/18 0600  gentamicin (GARAMYCIN) 380 mg in dextrose 5 % 100 mL IVPB  Status:  Discontinued     5 mg/kg  75.8 kg 109.5 mL/hr over 60 Minutes Intravenous On call to O.R. 12/08/18 1639 12/09/18 0953   12/08/18 2000  linezolid (ZYVOX) tablet 600 mg     600 mg Oral 2 times daily 12/08/18 1730         Objective: Vitals:   12/09/18 1215 12/09/18 1229 12/09/18 1230 12/09/18 1302  BP: (!) 121/58  118/60 116/61  Pulse: 74 68 68 64  Resp: 18 12 20 14   Temp:   (!) 97.3 F (36.3 C) 98.1 F (36.7 C)  TempSrc:      SpO2: 95% 98% 98% 99%    Intake/Output Summary (Last 24 hours) at 12/09/2018 1306 Last data filed at 12/09/2018 1234 Gross per 24 hour  Intake 2393 ml  Output 1120 ml  Net 1273 ml   There were no vitals filed for this visit. Weight change:   There is no height or weight on file to calculate BMI.  Intake/Output from previous day: 05/06 0701 - 05/07 0700 In: 1280 [P.O.:480; I.V.:800] Out: 1100 [Urine:1100] Intake/Output this shift: Total I/O In: 1113 [I.V.:1113] Out: 20 [Blood:20]  Examination:  General exam: Appears calm and comfortable,Not in distress, older fore the age HEENT:PERRL,Oral mucosa moist, Ear/Nose normal on gross exam Respiratory system: Bilateral equal air entry, normal vesicular breath sounds, no wheezes or crackles  Cardiovascular system: S1 & S2 heard,No JVD, murmurs. Gastrointestinal system: Abdomen is  soft, non tender, non distended, BS +  Nervous System:Alert and oriented. No focal neurological deficits/moving extremities, sensation intact. Extremities: Right groin with area of skin ulceration, induration erythema tenderness Skin: No rashes, lesions, no icterus MSK: Normal muscle bulk,tone ,power  Medications:  Scheduled Meds: . acetaminophen  1,000 mg Oral QHS  . acetaminophen  1,000 mg Oral TID  . bupivacaine liposome  20 mL Infiltration On Call to OR  . calcium carbonate  2 tablet Oral Daily  . Chlorhexidine Gluconate  Cloth  6 each Topical Once  . cholecalciferol  5,000 Units Oral Daily  . diltiazem (CARDIZEM CD) 360 mg  360 mg Oral Daily  . docusate sodium  200 mg Oral Daily  . fluticasone  1 spray Each Nare Daily  . gabapentin  100 mg Oral TID  . Investigational - Study Medication  100 mg Oral Q supper  . linezolid  600 mg Oral BID  . lip balm  1 application Topical BID  . LORazepam  0.5 mg Oral Daily  . LORazepam  1 mg Oral QHS  . mometasone-formoterol  2 puff Inhalation BID  . montelukast  10 mg Oral QHS  . naproxen sodium  412.5 mg Oral q morning - 10a  . pantoprazole  40 mg Oral Daily  . polyethylene glycol  17 g Oral Daily  . polyvinyl alcohol  2 drop Both Eyes BID  . sodium chloride flush  3 mL Intravenous Q12H  . sodium chloride flush  3 mL Intravenous Q12H  . sucralfate  1 g Oral TID WC & HS  . umeclidinium bromide  1 puff Inhalation Daily  . vitamin B-12  2,500 mcg Oral Daily   Continuous Infusions: . sodium chloride    . lactated ringers  Data Reviewed: I have personally reviewed following labs and imaging studies  CBC: Recent Labs  Lab 12/08/18 1735  WBC 9.1  HGB 12.0  HCT 38.3  MCV 93.2  PLT 299   Basic Metabolic Panel: Recent Labs  Lab 12/08/18 1735  NA 134*  K 3.8  CL 103  CO2 21*  GLUCOSE 93  BUN 18  CREATININE 1.04*  CALCIUM 9.1   GFR: Estimated Creatinine Clearance: 52.1 mL/min (A) (by C-G formula based on SCr of 1.04 mg/dL (H)). Liver Function Tests: Recent Labs  Lab 12/08/18 1735  AST 26  ALT 28  ALKPHOS 93  BILITOT 0.2*  PROT 6.9  ALBUMIN 3.7   No results for input(s): LIPASE, AMYLASE in the last 168 hours. No results for input(s): AMMONIA in the last 168 hours. Coagulation Profile: No results for input(s): INR, PROTIME in the last 168 hours. Cardiac Enzymes: No results for input(s): CKTOTAL, CKMB, CKMBINDEX, TROPONINI in the last 168 hours. BNP (last 3 results) No results for input(s): PROBNP in the last 8760 hours. HbA1C: No  results for input(s): HGBA1C in the last 72 hours. CBG: No results for input(s): GLUCAP in the last 168 hours. Lipid Profile: No results for input(s): CHOL, HDL, LDLCALC, TRIG, CHOLHDL, LDLDIRECT in the last 72 hours. Thyroid Function Tests: No results for input(s): TSH, T4TOTAL, FREET4, T3FREE, THYROIDAB in the last 72 hours. Anemia Panel: No results for input(s): VITAMINB12, FOLATE, FERRITIN, TIBC, IRON, RETICCTPCT in the last 72 hours. Sepsis Labs: No results for input(s): PROCALCITON, LATICACIDVEN in the last 168 hours.  Recent Results (from the past 240 hour(s))  SARS Coronavirus 2 (CEPHEID - Performed in [place] hospital lab), Hosp Order     Status: None   Collection Time: 12/08/18  5:16 PM  Result Value Ref Range Status   SARS Coronavirus 2 NEGATIVE NEGATIVE Final    Comment: (NOTE) If result is NEGATIVE SARS-CoV-2 target nucleic acids are NOT DETECTED. The SARS-CoV-2 RNA is generally detectable in upper and lower  respiratory specimens during the acute phase of infection. The lowest  concentration of SARS-CoV-2 viral copies this assay can detect is 250  copies / mL. A negative result does not preclude SARS-CoV-2 infection  and should not be used as the sole basis for treatment or other  patient management decisions.  A negative result may occur with  improper specimen collection / handling, submission of specimen other  than nasopharyngeal swab, presence of viral mutation(s) within the  areas targeted by this assay, and inadequate number of viral copies  (<250 copies / mL). A negative result must be combined with clinical  observations, patient history, and epidemiological information. If result is POSITIVE SARS-CoV-2 target nucleic acids are DETECTED. The SARS-CoV-2 RNA is generally detectable in upper and lower  respiratory specimens dur ing the acute phase of infection.  Positive  results are indicative of active infection with SARS-CoV-2.  Clinical  correlation  with patient history and other diagnostic information is  necessary to determine patient infection status.  Positive results do  not rule out bacterial infection or co-infection with other viruses. If result is PRESUMPTIVE POSTIVE SARS-CoV-2 nucleic acids MAY BE PRESENT.   A presumptive positive result was obtained on the submitted specimen  and confirmed on repeat testing.  While 2019 novel coronavirus  (SARS-CoV-2) nucleic acids may be present in the submitted sample  additional confirmatory testing may be necessary for epidemiological  and / or clinical management purposes  to differentiate between  SARS-CoV-2 and other  Sarbecovirus currently known to infect humans.  If clinically indicated additional testing with an alternate test  methodology [phone]) is advised. The SARS-CoV-2 RNA is generally  detectable in upper and lower respiratory sp ecimens during the acute  phase of infection. The expected result is Negative. Fact Sheet for Patients:  StrictlyIdeas.no Fact Sheet for Healthcare Providers: BankingDealers.co.za This test is not yet approved or cleared by the [place] FDA and has been authorized for detection and/or diagnosis of SARS-CoV-2 by FDA under an Emergency Use Authorization (EUA).  This EUA will remain in effect (meaning this test can be used) for the duration of the COVID-19 declaration under Section 564(b)(1) of the Act, 21 U.S.C. section 360bbb-3(b)(1), unless the authorization is terminated or revoked sooner. Performed at [facility], [place] [address] [place], [place] [address]   Surgical pcr screen     Status: None   Collection Time: 12/08/18  6:55 PM  Result Value Ref Range Status   MRSA, PCR NEGATIVE NEGATIVE Final   Staphylococcus aureus NEGATIVE NEGATIVE Final    Comment: (NOTE) The Xpert SA Assay (FDA approved for NASAL specimens in patients 64 years of age and older), is one  component of a comprehensive surveillance program. It is not intended to diagnose infection nor to guide or monitor treatment. Performed at [facility], [place] [address] [place], [place] [address]   Aerobic/Anaerobic Culture (surgical/deep wound)     Status: None (Preliminary result)   Collection Time: 12/09/18 11:04 AM  Result Value Ref Range Status   Specimen Description   Final    ABSCESS RIGHT GROIN Performed at [facility], [place] [address] [place], [place] [address]    Special Requests   Final    PATIENT ON FOLLOWING ZYVOX, CLOFAZAMINE Performed at [facility], [place] [address], [place], [place] [address]    Gram Stain PENDING  Incomplete   Culture PENDING  Incomplete   Report Status PENDING  Incomplete  Aerobic/Anaerobic Culture (surgical/deep wound)     Status: None (Preliminary result)   Collection Time: 12/09/18 11:47 AM  Result Value Ref Range Status   Specimen Description   Final    TISSUE RIGHT GROIN Performed at [facility], [place] [address], [place], [place] [address]    Special Requests   Final    PATIENT ON FOLLOWING ZYVOX, CLOFAZAMINE Performed at [facility], [place] [address] [place], [place] [address]    Gram Stain PENDING  Incomplete   Culture PENDING  Incomplete   Report Status PENDING  Incomplete      Radiology Studies: No results found.    LOS: 1 day   Time spent: More than 50% of that time was spent in counseling and/or coordination of care.  Antonieta Pert, MD [facility]  12/09/2018, 1:06 PM

## 2018-12-09 NOTE — Anesthesia Procedure Notes (Signed)
Procedure Name: Intubation Date/Time: 12/09/2018 11:07 AM Performed by: Eben Burow, CRNA Pre-anesthesia Checklist: Patient identified, Emergency Drugs available, Suction available, Patient being monitored and Timeout performed Patient Re-evaluated:Patient Re-evaluated prior to induction Oxygen Delivery Method: Circle system utilized Preoxygenation: Pre-oxygenation with 100% oxygen Induction Type: IV induction and Rapid sequence Laryngoscope Size: Mac Grade View: Grade I Tube type: Oral Tube size: 7.0 mm Number of attempts: 1 Airway Equipment and Method: Stylet Placement Confirmation: ETT inserted through vocal cords under direct vision,  positive ETCO2 and breath sounds checked- equal and bilateral Secured at: 22 cm Tube secured with: Tape Dental Injury: Teeth and Oropharynx as per pre-operative assessment

## 2018-12-09 NOTE — Consult Note (Addendum)
Catherine [name]  May 24, 1946 [id number]  CARE TEAM:  PCP: Catherine Jordan, MD  Outpatient Care Team: Patient Care Team: Catherine Jordan, MD as PCP - General (Family Medicine) Catherine [name], Catherine Means, MD as Consulting Physician (Neurology) Catherine Boston, MD as Consulting Physician (General Surgery) Catherine Lobo, MD as Consulting Physician (Gastroenterology) Catherine [name], Catherine Islam, MD as Consulting Physician (Infectious Diseases) Catherine Knife, MD as Rounding Team (Internal Medicine)  Inpatient Treatment Team: Treatment Team: Attending Provider: Antonieta Pert, MD; Registered Nurse: Catherine Mouse, RN; Rounding Team: Catherine Baseman, MD; Consulting Physician: Catherine Boston, MD; Case Manager: Catherine Rider, RN   This patient is a 73 y.o.female who presents today for surgical evaluation at the request of Dr Catherine [name].   Chief complaint / Reason for evaluation: Progressive atypical mycobacterial infection   The patient returns s/p  s/p excision of cyst in right groin 01/2016. Pathology c/w lymphagioma & benign LN s/p lap TEP excision of inner pelvic mass, bilateral femoral hernia repair, left inguinal hernia repair 01/2018. Pathology c/w synovial cyst s/p excision proximal thigh mass 06/2018. Pathology c/w syniovial cyst & benign LN Incision and drainage of small seroma right lower groin incision 09/20/2018 Right groin and inner thigh exploration with excision of subcutaneous and lymph node 11/02/2018   Patient very familiar to me.  Has required surgeries for groin and pelvic cysts with different pathologies.  Has developed an atypical mycobacterial infection.  Followed closely by Dr. Drucilla [name] with infectious disease.  Taken a while to get ID and cultures and sensitivity.  Unfortunately has increasing pain and swelling and progression on antibiotics.  Recommendation made for readmission and operative debridement.  Patient has groin and thigh pain and discomfort that is been frustrated.  No  major fevers or chills though.  No problems with urination or defecation.  She can do some walking but limited.  She remains abstinent from smoking.  She is currently on PO Zyvox and clofazamine but progresses.  .     AFB Culture:  Collected: 11/02/18 0859 Resulting lab: [place] Value: PositiveAbnormal  Comment: (NOTE) Acid-fast bacilli have been detected in culture at 2 weeks; see AFB Organism ID by DNA probe Performed At: [facility] [place], [place] [id number] Catherine Farmer MD YQ:[id number]  Component 2wk ago M tuberculosis complex Negative M avium complex Negative M kansasii Not Indicated M gordonae Not Indicated Other: Comment Comment: (NOTE) Unable to identify by DNA probe. See Organism ID by Sequencing. Performed At: [facility] [place], [place] [id number] Catherine Farmer MD RJ:[id number]  Assessment  Catherine [name]  73 y.o. female     Procedure(s): INCISION AND DRAINAGE RIGHT GROIN WITH DEBRIDEMENT  Problem List:  Principal Problem:   Soft tissue infection - Mycobaterium fortuitum   Active Problems:   Depression with anxiety   DDD (degenerative disc disease), lumbar   Cellulitis of right groin   Progressive soft tissue infection in the presence of atypical Mycobacterium followed closely by infectious disease  Plan:  -Antibiotics per infectious disease.  She is currently on PO Zyvox and clofazamine.  Discussed with Dr. Drucilla [name] with infectious disease.  IV alternatives have unacceptable side effect profile, so he wishes to keep the patient on that atypical antibiotic combination for now.  No need for contact precautions per ID.  Operative exploration today with incision and drainage.  Probable debridement.  Because some of this is been gone down to the muscle fascia, I do not think  this is something I can do at the bedside.  Re-sent for pathology and cultures to make sure were not  missing anything.  I did caution her that I would have to leave the wound open to allow this to heal by secondary intention.  In theory she could leave after surgery, but I suspect she will need to be followed for further antibiotics, pain control, wound care.  May be soon as tomorrow if this turns around quickly and infectious disease medicine do not have any other inpatient plans.  The anatomy and physiology of the region was discussed. The pathophysiology of subcutaneous abscess formation with progression to fasciitis & sepsis was discussed.  Need for incision, drainage, debridement discussed.  I stressed good hygiene & need for repeated wound care.  Possible redebridement / reoperation was discussed as well. Possibility of recurrence was discussed.   Risks of bleeding, infection, abscess, leak, injury to other organs, need for repair of tissues / organs, need for further treatment, heart attack, death, and other risks were discussed.  Benefits, alternatives were discussed. I noted a good likelihood this will help address the problem.  Questions answered.  The patient agrees to proceed.   -VTE prophylaxis- SCDs, etc -mobilize as tolerated to help recovery  25 minutes spent in review, evaluation, examination, counseling, and coordination of care.  More than 50% of that time was spent in counseling.  Catherine Hector, MD, FACS, MASCRS Gastrointestinal and Minimally Invasive Surgery    [address], [place], [place] [address] [phone] Main / Paging [phone] Fax   12/09/2018      Past Medical History:  Diagnosis Date   Anxiety    Aortic aneurysm ([place]) 12/08/2018   Arthritis    HANDS   Chronic constipation    Complication of anesthesia    upon waking up from anesthesia patient experienced chest pain - EKG done was negative, patient states "it may have been indigestion"   COPD (chronic obstructive pulmonary disease) (HCC)    DDD (degenerative disc  disease), lumbar    L2-3/3-4/4-5   Dysuria    Frequency of urination    Ganglion cyst of right groin 11/23/2018   GERD (gastroesophageal reflux disease)    Hypertension    Migraine    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 HUMPHRIES/GRAPEY   Wears glasses     Past Surgical History:  Procedure Laterality Date   ABDOMINAL HYSTERECTOMY  1982   W/  UNILATERAL SALPINGOOPHORECTOMY   bilateral hernia surgery      removed ganglion cyst and swollen lymph node    CHOLECYSTECTOMY OPEN  1987   W/  APPENDECTOMY   COLONOSCOPY  2002  &  12-2012 & 03/19/15   CYSTO WITH HYDRODISTENSION N/A 02/20/2014   Procedure: [place] AND MARCAINE INSTILLATION;  Surgeon: Bernestine Amass, MD;  Location: [facility];  Service: Urology;  Laterality: N/A;   EXCISION MASS LOWER EXTREMETIES Right 11/02/2018   Procedure: REMOVAL OF RIGHT GROIN AND THIGH SUBCUTANEOUS MASSES.;  Surgeon: Catherine Boston, MD;  Location: [place];  Service: General;  Laterality: Right;   EYE SURGERY     bilateral cataract surgery    LAPAROSCOPIC UNILATERAL SALPINGOOPHORECTOMY  1984   MASS EXCISION Right 06/16/2018   Procedure:  REMOVAL OF RIGHT GROIN SUBCUTANEOUS MASS ERAS PATHWAY;  Surgeon: Catherine Boston, MD;  Location: [facility];  Service: General;  Laterality: Right;   removal of lymph node cyst in right groin      ROTATOR CUFF REPAIR Right 2003   and BONE SPUR    Social History   Socioeconomic History   Marital status: Married    Spouse name: Not on file   Number of children: Not on file   Years of education: Not on file   Highest education level: Not on file  Occupational History   Not on file  Social Needs   Financial resource strain: Not on file   Food insecurity:     Worry: Not on file    Inability: Not on file   Transportation needs:    Medical: Not on file    Non-medical: Not on file  Tobacco Use   Smoking status: Former Smoker    Packs/day: 0.75    Years: 48.00    Pack years: 36.00    Types: Cigarettes    Last attempt to quit: 09/04/2017    Years since quitting: 1.2   Smokeless tobacco: Never Used  Substance and Sexual Activity   Alcohol use: No   Drug use: No   Sexual activity: Not on file  Lifestyle   Physical activity:    Days per week: Not on file    Minutes per session: Not on file   Stress: Not on file  Relationships   Social connections:    Talks on phone: Not on file    Gets together: Not on file    Attends religious service: Not on file    Active member of club or organization: Not on file    Attends meetings of clubs or organizations: Not on file    Relationship status: Not on file   Intimate partner violence:    Fear of current or ex partner: Not on file    Emotionally abused: Not on file    Physically abused: Not on file    Forced sexual activity: Not on file  Other Topics Concern   Not on file  Social History Narrative   Not on file    Family History  Problem Relation Age of Onset   Esophageal cancer Mother 7   Lung cancer Father 79    Current Facility-Administered Medications  Medication Dose Route Frequency Provider Last Rate Last Dose   0.9 %  sodium chloride infusion   Intravenous Continuous Kc, Ramesh, MD       acetaminophen (TYLENOL) tablet 650 mg  650 mg Oral Q6H PRN Kc, Ramesh, MD       Or   acetaminophen (TYLENOL) suppository 650 mg  650 mg Rectal Q6H PRN Kc, Ramesh, MD       acetaminophen (TYLENOL) tablet 1,000 mg  1,000 mg Oral On Call to OR Catherine Pert, MD       acetaminophen (TYLENOL) tablet 1,000 mg  1,000 mg Oral QHS Kc, Ramesh, MD   1,000 mg at 12/08/18 2252   albuterol (PROVENTIL) (2.5 MG/3ML) 0.083% nebulizer solution 2.5 mg  2.5 mg Inhalation Q4H PRN Kc, Maren Beach, MD         calcium carbonate (TUMS - dosed in mg elemental calcium) chewable tablet 400 mg of elemental calcium  2 tablet Oral Daily Kc, Ramesh, MD       Chlorhexidine Gluconate Cloth 2 % PADS 6 each  6 each Topical Once Catherine Pert, MD  cholecalciferol (VITAMIN D3) tablet 5,000 Units  5,000 Units Oral Daily Kc, Ramesh, MD       clindamycin (CLEOCIN) IVPB 900 mg  900 mg Intravenous On Call to OR Catherine Pert, MD       And   gentamicin (GARAMYCIN) 380 mg in dextrose 5 % 100 mL IVPB  5 mg/kg Intravenous On Call to OR Catherine Pert, MD       Derrill Memo ON 12/10/2018] diltiazem (CARDIZEM CD) 24 hr capsule 360 mg  360 mg Oral Daily Kc, Ramesh, MD       docusate sodium (COLACE) capsule 200 mg  200 mg Oral Daily Kc, Ramesh, MD       fluticasone (FLONASE) 50 MCG/ACT nasal spray 1 spray  1 spray Each Nare Daily Kc, Ramesh, MD       gabapentin (NEURONTIN) capsule 100 mg  100 mg Oral Daily PRN Catherine Pert, MD   100 mg at 12/08/18 2252   gabapentin (NEURONTIN) capsule 300 mg  300 mg Oral On Call to OR Catherine Pert, MD       HYDROcodone-acetaminophen (NORCO/VICODIN) 5-325 MG per tablet 1-2 tablet  1-2 tablet Oral Q6H PRN Catherine Pert, MD       Investigational - Study Medication 100 mg  100 mg Oral Q supper Thomes Lolling, RPH   100 mg at 12/08/18 1944   linezolid (ZYVOX) tablet 600 mg  600 mg Oral BID Kc, Maren Beach, MD   600 mg at 12/08/18 2148   LORazepam (ATIVAN) tablet 0.5 mg  0.5 mg Oral Daily Kc, Ramesh, MD       LORazepam (ATIVAN) tablet 1 mg  1 mg Oral QHS Lenis Noon, RPH   1 mg at 12/08/18 2252   mometasone-formoterol (DULERA) 200-5 MCG/ACT inhaler 2 puff  2 puff Inhalation BID Kc, Ramesh, MD       montelukast (SINGULAIR) tablet 10 mg  10 mg Oral QHS Kc, Ramesh, MD       naproxen sodium (ANAPROX) tablet 412.5 mg  412.5 mg Oral q morning - 10a Kc, Ramesh, MD       ondansetron (ZOFRAN) tablet 4 mg  4 mg Oral Q6H PRN Kc, Ramesh, MD       Or   ondansetron (ZOFRAN) injection 4 mg  4 mg Intravenous  Q6H PRN Kc, Ramesh, MD       pantoprazole (PROTONIX) EC tablet 40 mg  40 mg Oral Daily Kc, Ramesh, MD       polyethylene glycol (MIRALAX / GLYCOLAX) packet 17 g  17 g Oral Daily Kc, Ramesh, MD       polyvinyl alcohol (LIQUIFILM TEARS) 1.4 % ophthalmic solution 2 drop  2 drop Both Eyes BID Theodis Shove M, RPH       sodium chloride flush (NS) 0.9 % injection 3 mL  3 mL Intravenous Q12H Kc, Ramesh, MD       sucralfate (CARAFATE) tablet 1 g  1 g Oral TID WC & HS Kc, Ramesh, MD       umeclidinium bromide (INCRUSE ELLIPTA) 62.5 MCG/INH 1 puff  1 puff Inhalation Daily Swayne, Mary M, RPH       vitamin B-12 (CYANOCOBALAMIN) tablet 2,500 mcg  2,500 mcg Oral Daily Kc, Ramesh, MD         Allergies  Allergen Reactions   Ciprofloxacin Hcl Other (See Comments)    Aortic aneurysm per MD   Metoprolol Other (See Comments) and Cough    and wheezing   Penicillins Shortness Of Breath,  Rash and Other (See Comments)    Has patient had a PCN reaction causing immediate rash, facial/tongue/throat swelling, SOB or lightheadedness with hypotension: Yes Has patient had a PCN reaction causing severe rash involving mucus membranes or skin necrosis: No Has patient had a PCN reaction that required hospitalization: Yes - MD office Has patient had a PCN reaction occurring within the last 10 years: No If all of the above answers are "NO", then may proceed with Cephalosporin use.    Advair Diskus [Fluticasone-Salmeterol] Anxiety   Sulfa Antibiotics Rash    ROS:   All other systems reviewed & are negative except per HPI or as noted below: Constitutional:  No fevers, chills, sweats.  Weight stable Eyes:  No vision changes, No discharge HENT:  No sore throats, nasal drainage Lymph: No neck swelling, No bruising easily Pulmonary:  No cough, productive sputum CV: No orthopnea, PND  Patient walks 30 minutes for about 1 miles without difficulty.  No exertional chest/neck/shoulder/arm pain. GI: No personal nor  family history of GI/colon cancer, inflammatory bowel disease, irritable bowel syndrome, allergy such as Celiac Sprue, dietary/dairy problems, colitis, ulcers nor gastritis.  No recent sick contacts/gastroenteritis.  No travel outside the country.  No changes in diet. Renal: No UTIs, No hematuria Genital:  No drainage, bleeding, masses Musculoskeletal: No severe joint pain.  Good ROM major joints Skin:  No sores or lesions.  No rashes Heme/Lymph:  No easy bleeding.  No swollen lymph nodes Neuro: No focal weakness/numbness.  No seizures Psych: No suicidal ideation.  No hallucinations  BP 121/62 (BP Location: Right Arm)    Pulse 72    Temp 98.9 F (37.2 C) (Oral)    Resp 17    SpO2 100%   Physical Exam: General: Pt awake/alert/oriented x4 in mild acute distress.  Not toxic, but anxious and frustrated.  Restless. Eyes: PERRL, normal EOM. Sclera nonicteric Neuro: CN II-XII intact w/o focal sensory/motor deficits. Lymph: No head/neck/groin lymphadenopathy Psych:  No delerium/psychosis/paranoia HENT: Normocephalic, Mucus membranes moist.  No thrush Neck: Supple, No tracheal deviation Chest: No pain.  Good respiratory excursion. CV:  Pulses intact.  Regular rhythm Abdomen: Soft, Nondistended.  Nontender.  No incarcerated hernias.  Gen: Obvious swelling in right groin going to proximal thigh with wound separation and thick yellow-white material.  Moderate region of cellulitis.      Ext:  SCDs BLE.  No significant edema.  No cyanosis Skin: No petechiae / purpurea.  No major sores Musculoskeletal: No severe joint pain.  Good ROM major joints   Results:   Labs: Results for orders placed or performed during the hospital encounter of 12/08/18 (from the past 48 hour(s))  SARS Coronavirus 2 (CEPHEID - Performed in [facility] hospital lab), Hosp Order     Status: None   Collection Time: 12/08/18  5:16 PM  Result Value Ref Range   SARS Coronavirus 2 NEGATIVE NEGATIVE    Comment: (NOTE) If  result is NEGATIVE SARS-CoV-2 target nucleic acids are NOT DETECTED. The SARS-CoV-2 RNA is generally detectable in upper and lower  respiratory specimens during the acute phase of infection. The lowest  concentration of SARS-CoV-2 viral copies this assay can detect is 250  copies / mL. A negative result does not preclude SARS-CoV-2 infection  and should not be used as the sole basis for treatment or other  patient management decisions.  A negative result may occur with  improper specimen collection / handling, submission of specimen other  than nasopharyngeal swab, presence of  viral mutation(s) within the  areas targeted by this assay, and inadequate number of viral copies  (<250 copies / mL). A negative result must be combined with clinical  observations, patient history, and epidemiological information. If result is POSITIVE SARS-CoV-2 target nucleic acids are DETECTED. The SARS-CoV-2 RNA is generally detectable in upper and lower  respiratory specimens dur ing the acute phase of infection.  Positive  results are indicative of active infection with SARS-CoV-2.  Clinical  correlation with patient history and other diagnostic information is  necessary to determine patient infection status.  Positive results do  not rule out bacterial infection or co-infection with other viruses. If result is PRESUMPTIVE POSTIVE SARS-CoV-2 nucleic acids MAY BE PRESENT.   A presumptive positive result was obtained on the submitted specimen  and confirmed on repeat testing.  While 2019 novel coronavirus  (SARS-CoV-2) nucleic acids may be present in the submitted sample  additional confirmatory testing may be necessary for epidemiological  and / or clinical management purposes  to differentiate between  SARS-CoV-2 and other Sarbecovirus currently known to infect humans.  If clinically indicated additional testing with an alternate test  methodology [phone]) is advised. The SARS-CoV-2 RNA is generally    detectable in upper and lower respiratory sp ecimens during the acute  phase of infection. The expected result is Negative. Fact Sheet for Patients:  StrictlyIdeas.no Fact Sheet for Healthcare Providers: BankingDealers.co.za This test is not yet approved or cleared by the [place] FDA and has been authorized for detection and/or diagnosis of SARS-CoV-2 by FDA under an Emergency Use Authorization (EUA).  This EUA will remain in effect (meaning this test can be used) for the duration of the COVID-19 declaration under Section 564(b)(1) of the Act, 21 U.S.C. section 360bbb-3(b)(1), unless the authorization is terminated or revoked sooner. Performed at [facility], [place] [address], [place], [place] [address]   CBC     Status: None   Collection Time: 12/08/18  5:35 PM  Result Value Ref Range   WBC 9.1 4.0 - 10.5 K/uL   RBC 4.11 3.87 - 5.11 MIL/uL   Hemoglobin 12.0 12.0 - 15.0 g/dL   HCT 38.3 36.0 - 46.0 %   MCV 93.2 80.0 - 100.0 fL   MCH 29.2 26.0 - 34.0 pg   MCHC 31.3 30.0 - 36.0 g/dL   RDW 13.2 11.5 - 15.5 %   Platelets 263 150 - 400 K/uL   nRBC 0.0 0.0 - 0.2 %    Comment: Performed at [facility], [place] [address], [place], [place] [address]  Comprehensive metabolic panel     Status: Abnormal   Collection Time: 12/08/18  5:35 PM  Result Value Ref Range   Sodium 134 (L) 135 - 145 mmol/L   Potassium 3.8 3.5 - 5.1 mmol/L   Chloride 103 98 - 111 mmol/L   CO2 21 (L) 22 - 32 mmol/L   Glucose, Bld 93 70 - 99 mg/dL   BUN 18 8 - 23 mg/dL   Creatinine, Ser 1.04 (H) 0.44 - 1.00 mg/dL   Calcium 9.1 8.9 - 10.3 mg/dL   Total Protein 6.9 6.5 - 8.1 g/dL   Albumin 3.7 3.5 - 5.0 g/dL   AST 26 15 - 41 U/L   ALT 28 0 - 44 U/L   Alkaline Phosphatase 93 38 - 126 U/L   Total Bilirubin 0.2 (L) 0.3 - 1.2 mg/dL   GFR calc non Af Amer 53 (L) >60 mL/min   GFR  calc Af Amer >60 >60 mL/min   Anion gap 10 5 - 15     Comment: Performed at [facility], [place] [address], [place], [place] [address]  Surgical pcr screen     Status: None   Collection Time: 12/08/18  6:55 PM  Result Value Ref Range   MRSA, PCR NEGATIVE NEGATIVE   Staphylococcus aureus NEGATIVE NEGATIVE    Comment: (NOTE) The Xpert SA Assay (FDA approved for NASAL specimens in patients 42 years of age and older), is one component of a comprehensive surveillance program. It is not intended to diagnose infection nor to guide or monitor treatment. Performed at [facility], [place] [address], [place], [place] [address]     Imaging / Studies: Ct Abdomen Pelvis W Contrast  Result Date: 11/27/2018 CLINICAL DATA:  Mass lump in groin. Recent surgery for mass removal 10/30/2018. Concern mesh may be infected, abscess. EXAM: CT ABDOMEN AND PELVIS WITH CONTRAST TECHNIQUE: Multidetector CT imaging of the abdomen and pelvis was performed using the standard protocol following bolus administration of intravenous contrast. CONTRAST:  123mL ISOVUE-300 IOPAMIDOL (ISOVUE-300) INJECTION 61% COMPARISON:  05/31/2018 FINDINGS: Lower chest: No acute abnormality Hepatobiliary: No focal liver abnormality is seen. Status post cholecystectomy. No biliary dilatation. Pancreas: No focal abnormality or ductal dilatation. Spleen: No focal abnormality.  Normal size. Adrenals/Urinary Tract: No adrenal abnormality. No focal renal abnormality. No stones or hydronephrosis. Urinary bladder is unremarkable. Stomach/Bowel: Large stool burden in the colon. No evidence of bowel obstruction. Stomach, small bowel decompressed, unremarkable. Vascular/Lymphatic: Aortic atherosclerosis. No enlarged abdominal or pelvic lymph nodes. Reproductive: Prior hysterectomy.  No adnexal masses. Other: No free fluid or free air. There is a fluid collection within the right inguinal region, extending medially into the right perineal soft tissues. Greatest length measures  6.4 cm. Greatest thickness measures 1.4 cm. This is concerning for abscess. There are multiple enhancing areas in the adductor muscles, best seen coronal image 20 7-29 concerning for small intramuscular abscesses. Musculoskeletal: No acute bony abnormality. IMPRESSION: Fluid collection in the right inguinal region, extending medially into the upper right perineal soft tissues concerning for postoperative abscess, 6.4 x 1.4 cm. Areas of enhancement also noted in the adjacent abductor muscles which on the coronal imaging appear to represent small intramuscular abscesses. Large stool burden in the colon. Prior cholecystectomy and hysterectomy. Aortic atherosclerosis. Electronically Signed   By: Rolm Baptise M.D.   On: 11/27/2018 19:08    Medications / Allergies: per chart  Antibiotics: Anti-infectives (From admission, onward)   Start     Dose/Rate Route Frequency Ordered Stop   12/09/18 0600  clindamycin (CLEOCIN) IVPB 900 mg     900 mg 100 mL/hr over 30 Minutes Intravenous On call to O.R. 12/08/18 1639 12/10/18 0559   12/09/18 0600  gentamicin (GARAMYCIN) 380 mg in dextrose 5 % 100 mL IVPB     5 mg/kg  75.8 kg 109.5 mL/hr over 60 Minutes Intravenous On call to O.R. 12/08/18 1639 12/10/18 0559   12/08/18 2000  linezolid (ZYVOX) tablet 600 mg     600 mg Oral 2 times daily 12/08/18 1730          Note: Portions of this report may have been transcribed using voice recognition software. Every effort was made to ensure accuracy; however, inadvertent computerized transcription errors may be present.   Any transcriptional errors that result from this process are unintentional.    Catherine Hector, MD, FACS, MASCRS Gastrointestinal and Minimally Invasive Surgery  [address], [place], [place] [address] [phone] Main / Paging [phone] Fax   12/09/2018

## 2018-12-09 NOTE — Telephone Encounter (Signed)
Completed and sent clofazimine application for patient to [employer]. Will update Dr. Tommy Medal when approval status has been decided.

## 2018-12-09 NOTE — Op Note (Signed)
12/09/2018  12:04 PM  PATIENT:  Catherine [name]  73 y.o. female  Patient Care Team: Jonathon Jordan, MD as PCP - General (Family Medicine) Felecia Shelling, Nanine Means, MD as Consulting Physician (Neurology) Michael Boston, MD as Consulting Physician (General Surgery) Ronald Lobo, MD as Consulting Physician (Gastroenterology) Tommy Medal, Lavell Islam, MD as Consulting Physician (Infectious Diseases) Jackelyn Knife, MD as Rounding Team (Internal Medicine)  PRE-OPERATIVE DIAGNOSIS:  RIGHT GROIN & THIGH ABSCESSES  POST-OPERATIVE DIAGNOSIS:  RIGHT GROIN & THIGH ABSCESSES   PROCEDURE:  INCISION AND DRAINAGE RIGHT GROIN AND THIGH ABSCESSES DEBRIDEMENT  SURGEON:  [name], MD  ASSISTANT: OR Staff   ANESTHESIA:   local and general  EBL:  Total I/O In: 440 [I.V.:913] Out: 20 [Blood:20]  Delay start of Pharmacological VTE agent (>24hrs) due to surgical blood loss or risk of bleeding:  no  DRAINS: none   SPECIMEN: Right groin and thigh skin, dermis, subcutaneous tissue, fascia for culture and pathology  DISPOSITION OF SPECIMEN:  MICROBIOLOGY & PATHOLOGY  COUNTS:  YES  PLAN OF CARE: Admit to inpatient   PATIENT DISPOSITION:  PACU - hemodynamically stable.  INDICATION: Former smoking female with right groin masses requiring numerous operations.  S/p  excision of cyst in right groin 01/2016. Pathology c/w lymphagioma & benign LN s/p lap TEP excision of inner pelvic mass, bilateral femoral hernia repair, left inguinal hernia repair 01/2018. Pathology c/w synovial cyst s/p excision proximal thigh mass 06/2018. Pathology c/w syniovial cyst & benign LN Incision and drainage of small seroma right lower groin incision 09/20/2018 Right groin and inner thigh exploration with excision of subcutaneous and lymph node 11/02/2018  Development of chronic wound infection.  Growing atypical Mycobacterium species.  Followed by infectious disease.  Progression of inflammation despite antibiotic  regimen.  Admitted for worsening cellulitis and pain.  Recommendation made for operative incision and drainage of abscesses and debridement of chronically inflamed infected tissue The anatomy and physiology of skin abscesses was discussed. Pathophysiology of SQ abscess, possible progression to fasciitis & sepsis, etc discussed . I stressed good hygiene & wound care. Possible redebridement was discussed as well.   Possibility of recurrence was discussed. Risks, benefits, alternatives were discussed. I noted a good likelihood this will help address the problem. Risks of anesthesia and other risks discussed. Questions answered. The patient is does wish to proceed.   OR FINDINGS: Swollen indurated right groin and inner thigh incisions with cellulitis and scant purulent drainage.  Some tissue necrosis/infection.    Incision done in right groin and right thigh through skin, dermis, subcutaneous tissues, down to level of fascia.  Excision with surgical scalpel and cautery.  Right groin wound 5 x 2 cm.  Goes 2 cm deep.  Right thigh wound 7 x 3 cm with undermining.  Goes 3 cm deep   DESCRIPTION:   Informed consent was confirmed. The patient remained on oral linezolid & clofazimine per infectious disease. The patient underwent general anesthesia without any difficulty. The patient was positioned supine. SCDs were active during the entire case. The area around the abscess was prepped and draped in a sterile fashion. A surgical timeout confirmed our plan.   I made an incision over the most fluctuant area of the right groin and right thigh inflamed former incisions.   Purulent material expressed.  Sent for aerobic and anaerobic culture.  I placed my finger into the abscess cavity to break up loculations.  The incision was extended to adequately expose the entire cavity.  I  extended the wound to expose the chronically inflamed necrotic and infected tissue.  I used scalpel and scissors to excise necrotic and  infected tissue down to the level of the anterior abdominal wall fascia on the right groin and the anterior inner fascia of the right thigh.  We did irrigation. The fascia was viable.  Excess skin excised to have nice open wounds.  Right thigh wound undermined it cephalad for the central area of swelling not associated with incision.  We took extra care to ensure hemostasis.  Tissue was sent for culture and pathology.  We called and discussed with microbiology to make sure that appropriate specimens were obtained and handled  The wound was packed with saline moistened gauze.  Sterile dressings applied.  Patient is being extubated go to recovery room.   Plan is to continue antibiotic regimen.  I did do a field block of local anesthetic.  We will begin dressing care tomorrow.  Hopefully go home tomorrow.  Per the patient's request, I did not call her spouse or any other family member.  [name], M.D., F.A.C.S. Gastrointestinal and Minimally Invasive Surgery [facility] [address] [place], [place] [address] [phone] Main / Paging

## 2018-12-09 NOTE — Anesthesia Postprocedure Evaluation (Signed)
Anesthesia Post Note  Patient: Catherine [name]  Procedure(s) Performed: INCISION AND DRAINAGE RIGHT GROIN and thigh  WITH DEBRIDEMENT (N/A Groin)     Patient location during evaluation: PACU Anesthesia Type: General Level of consciousness: awake and alert Pain management: pain level controlled Vital Signs Assessment: post-procedure vital signs reviewed and stable Respiratory status: spontaneous breathing, nonlabored ventilation, respiratory function stable and patient connected to nasal cannula oxygen Cardiovascular status: blood pressure returned to baseline and stable Postop Assessment: no apparent nausea or vomiting Anesthetic complications: no    Last Vitals:  Vitals:   12/09/18 1302 12/09/18 1413  BP: 116/61 120/63  Pulse: 64 66  Resp: 14 15  Temp: 36.7 C 37 C  SpO2: 99% 99%    Last Pain:  Vitals:   12/09/18 1413  TempSrc: Oral  PainSc:                  Montez Hageman

## 2018-12-09 NOTE — Anesthesia Preprocedure Evaluation (Signed)
Anesthesia Evaluation  Patient identified by MRN, date of birth, ID band Patient awake    Reviewed: Allergy & Precautions, NPO status , Patient's Chart, lab work & pertinent test results  Airway Mallampati: II  TM Distance: >3 FB Neck ROM: Full    Dental no notable dental hx.    Pulmonary COPD, former smoker,    Pulmonary exam normal breath sounds clear to auscultation       Cardiovascular hypertension, Pt. on medications Normal cardiovascular exam Rhythm:Regular Rate:Normal     Neuro/Psych negative neurological ROS  negative psych ROS   GI/Hepatic Neg liver ROS, GERD  Poorly Controlled,  Endo/Other  negative endocrine ROS  Renal/GU negative Renal ROS  negative genitourinary   Musculoskeletal negative musculoskeletal ROS (+)   Abdominal   Peds negative pediatric ROS (+)  Hematology negative hematology ROS (+)   Anesthesia Other Findings H/o Reflux in PACU  Reproductive/Obstetrics negative OB ROS                             Anesthesia Physical Anesthesia Plan  ASA: III  Anesthesia Plan: General   Post-op Pain Management:    Induction: Intravenous, Rapid sequence and Cricoid pressure planned  PONV Risk Score and Plan: 3  Airway Management Planned: Oral ETT  Additional Equipment:   Intra-op Plan:   Post-operative Plan: Extubation in OR  Informed Consent: I have reviewed the patients History and Physical, chart, labs and discussed the procedure including the risks, benefits and alternatives for the proposed anesthesia with the patient or authorized representative who has indicated his/her understanding and acceptance.     Dental advisory given  Plan Discussed with: CRNA  Anesthesia Plan Comments:         Anesthesia Quick Evaluation

## 2018-12-09 NOTE — Transfer of Care (Signed)
Immediate Anesthesia Transfer of Care Note  Patient: Catherine [name]  Procedure(s) Performed: INCISION AND DRAINAGE RIGHT GROIN and thigh  WITH DEBRIDEMENT (N/A Groin)  Patient Location: PACU  Anesthesia Type:General  Level of Consciousness: awake, alert  and oriented  Airway & Oxygen Therapy: Patient Spontanous Breathing and Patient connected to face mask oxygen  Post-op Assessment: Report given to RN and Post -op Vital signs reviewed and stable  Post vital signs: Reviewed and stable  Last Vitals:  Vitals Value Taken Time  BP 116/66 12/09/2018 12:00 PM  Temp    Pulse 84 12/09/2018 12:02 PM  Resp 8 12/09/2018 12:02 PM  SpO2 100 % 12/09/2018 12:02 PM  Vitals shown include unvalidated device data.  Last Pain:  Vitals:   12/09/18 1018  TempSrc: Oral  PainSc:          Complications: No apparent anesthesia complications

## 2018-12-09 NOTE — Telephone Encounter (Signed)
Perfect

## 2018-12-10 ENCOUNTER — Encounter (HOSPITAL_COMMUNITY): Payer: Self-pay | Admitting: Surgery

## 2018-12-10 ENCOUNTER — Telehealth: Payer: Self-pay | Admitting: Infectious Disease

## 2018-12-10 DIAGNOSIS — A318 Other mycobacterial infections: Secondary | ICD-10-CM

## 2018-12-10 LAB — ACID FAST SMEAR (AFB, MYCOBACTERIA): Acid Fast Smear: NEGATIVE

## 2018-12-10 MED ORDER — HYDROMORPHONE HCL 2 MG PO TABS: 2.0000 mg | ORAL_TABLET | Freq: Three times a day (TID) | ORAL | 0 refills | Status: DC | PRN

## 2018-12-10 MED ORDER — NAPROXEN SODIUM 220 MG PO TABS: 440.0000 mg | ORAL_TABLET | Freq: Every morning | ORAL | Status: DC

## 2018-12-10 NOTE — TOC Transition Note (Signed)
Transition of Care [facility]) - CM/SW Discharge Note   Patient Details  Name: Catherine [name] MRN: [id number] Date of Birth: 02-26-1946  [facility]) CM/SW Contact:  Leeroy Cha, RN Phone Number: 12/10/2018, 11:40 AM   Clinical Narrative:    Home with hhc-rn-see below   Final next level of care: [place] Barriers to Discharge: No Barriers Identified   Patient Goals and CMS Choice Patient states their goals for this hospitalization and ongoing recovery are:: to go home and continue getting better CMS Medicare.gov Compare Post Acute Care list provided to:: Patient Choice offered to / list presented to : Patient  Discharge Placement                       Discharge Plan and Services   Discharge Planning Services: CM Consult Post Acute Care Choice: Home Health                    HH Arranged: RN [place]: [place] ([place]) Date [facility] Agency Contacted: 12/10/18 Time [place]: 1138 Representative spoke with at [place]: [place] by[place]  Social Determinants of Health ([place]) Interventions     Readmission Risk Interventions No flowsheet data found.

## 2018-12-10 NOTE — Discharge Instructions (Signed)
Continue antibiotics per Dr. Drucilla Schmidt with infectious disease.  Control pain.  Change dressing on wound every day.  WOUND CARE  It is important that the wound be kept open.   -Keeping the skin edges apart will allow the wound to gradually heal from the base upwards.   - If the skin edges of the wound close too early, a new fluid pocket can form and infection can occur. -This is the reason to pack deeper wounds with gauze or ribbon -This is why drained wounds cannot be sewed closed right away  A healthy wound should form a lining of bright red "beefy" granulating tissue that will help shrink the wound and help the edges grow new skin into it.   -A little mucus / yellow discharge is normal (the body's natural way to try and form a scab) and should be gently washed off with soap and water with daily dressing changes.  -Green or foul smelling drainage implies bacterial colonization and can slow wound healing - a short course of antibiotic ointment (3-5 days) can help it clear up.  Call the doctor if it does not improve or worsens  -Avoid use of antibiotic ointments for more than a week as they can slow wound healing over time.    -Sometimes other wound care products will be used to reduce need for dressing changes and/or help clean up dirty wounds -Sometimes the surgeon needs to debride the wound in the office to remove dead or infected tissue out of the wound so it can heal more quickly and safely.    Change the dressing at least once a day -Wash the wound with mild soap and water gently every day.  It is good to shower or bathe the wound to help it clean out. -Use clean 4x4 gauze for medium/large wounds or ribbon plain NU-gauze for smaller wounds (it does not need to be sterile, just clean) -Keep the raw wound moist with a little saline or KY (saline) gel on the gauze.  -A dry wound will take longer to heal.  -Keep the skin dry around the wound to prevent breakdown and irritation. -Pack the  wound down to the base -The goal is to keep the skin apart, not overpack the wound -Use a Q-tip or blunt-tipped kabob stick toothpick to push the gauze down to the base in narrow or deep wounds   -Cover with a clean gauze and tape -paper or Medipore tape tend to be gentle on the skin -rotate the orientation of the tape to avoid repeated stress/trauma on the skin -using an ACE or Coban wrap on wounds on arms or legs can be used instead.  Complete all antibiotics through the entire prescription to help the infection heal and prevent new places of infection   Returning the see the surgeon is helpful to follow the healing process and help the wound close as fast as possible.

## 2018-12-10 NOTE — TOC Transition Note (Signed)
Transition of Care [facility]) - CM/SW Discharge Note   Patient Details  Name: Catherine [name] MRN: [id number] Date of Birth: [birth year]  [facility]) CM/SW Contact:  Leeroy Cha, RN Phone Number: 12/10/2018, 1:18 PM   Clinical Narrative:    [name] will contact patient on Monday for dressing changes, patient states she can try to do them at home with help from the spouse until then.   Final next level of care: [place] Barriers to Discharge: No Barriers Identified   Patient Goals and CMS Choice Patient states their goals for this hospitalization and ongoing recovery are:: to go home and continue getting better CMS Medicare.gov Compare Post Acute Care list provided to:: Patient Choice offered to / list presented to : Patient  Discharge Placement                       Discharge Plan and Services   Discharge Planning Services: CM Consult Post Acute Care Choice: Home Health                    HH Arranged: RN [place]: [place] Date [facility] Agency Contacted: 12/10/18 Time [place]: 1211 Representative spoke with at [place]: corey bennett  Social Determinants of Health ([place]) Interventions     Readmission Risk Interventions No flowsheet data found.

## 2018-12-10 NOTE — Progress Notes (Signed)
Catherine [name] [id number] 12-25-1945  CARE TEAM:  PCP: Jonathon Jordan, MD  Outpatient Care Team: Patient Care Team: Jonathon Jordan, MD as PCP - General (Family Medicine) Felecia Shelling, Nanine Means, MD as Consulting Physician (Neurology) Michael Boston, MD as Consulting Physician (General Surgery) Ronald Lobo, MD as Consulting Physician (Gastroenterology) Tommy Medal, Lavell Islam, MD as Consulting Physician (Infectious Diseases) Jackelyn Knife, MD as Rounding Team (Internal Medicine)  Inpatient Treatment Team: Treatment Team: Attending Provider: Antonieta Pert, MD; Registered Nurse: Oleta Mouse, RN; Rounding Team: Redmond Baseman, MD; Consulting Physician: Michael Boston, MD; Consulting Physician: Tommy Medal, Lavell Islam, MD; Registered Nurse: Heloise Ochoa, RN; Technician: Abbe Amsterdam, NT   Problem List:   Principal Problem:   Soft tissue infection - Mycobaterium fortuitum   Active Problems:   Depression with anxiety   DDD (degenerative disc disease), lumbar   Cellulitis of right groin   Mycobacterium fortuitum infection   Thigh abscess   1 Day Post-Op  12/09/2018  POST-OPERATIVE DIAGNOSIS:  RIGHT GROIN & THIGH ABSCESSES   PROCEDURE:  INCISION AND DRAINAGE RIGHT GROIN AND THIGH ABSCESSES DEBRIDEMENT  SURGEON:  [name], MD    Assessment  Recovering  [facility] Stay = 2 days)  Plan: Okay to discharge from surgery standpoint.  Can follow-up with me in a couple weeks to follow-up on wound.  Follow-up with infectious disease. -Pack wound daily.  Teach dressing care.  Patient requests and most likely would benefit from home health wound care. -Antibiotics per infectious disease.  She is currently on PO linezolid and clofazamine.  Discussed with Dr. Drucilla Schmidt with infectious disease.  IV alternatives have unacceptable side effect profile, so he wishes to keep the patient on that atypical antibiotic combination for now.  No need for contact precautions per ID -Pain  control.  Gabapentin seems to help for most of it.  She says she has plenty of that at home.  Occasional hydromorphone for breakthrough.  She is tolerating that better than the other narcotics. .-VTE prophylaxis- SCDs, etc -mobilize as tolerated to help recovery  20 minutes spent in review, evaluation, examination, counseling, and coordination of care.  More than 50% of that time was spent in counseling.  12/10/2018    Subjective: (Chief complaint)  Feels better overall.  Tolerated dressing change.  Still rather sensitive.  Gabapentin increased seems to have helped.  Dilaudid well-tolerated for breakthrough pain.  She is needed that a few times.  Hoping to go home.  Objective:  Vital signs:  Vitals:   12/09/18 1513 12/09/18 2038 12/10/18 0204 12/10/18 0450  BP: 118/62 (!) 120/59 124/60 123/77  Pulse: 63 66 70 75  Resp: 16 18 18 18   Temp:  98.4 F (36.9 C) 98.5 F (36.9 C) 98.7 F (37.1 C)  TempSrc:  Oral Oral Oral  SpO2: 99% 97% 96% 98%    Last BM Date: 12/08/18  Intake/Output   Yesterday:  05/07 0701 - 05/08 0700 In: 9024 [P.O.:600; I.V.:1113] Out: 1020 [Urine:1000; Blood:20] This shift:  No intake/output data recorded.  Bowel function:  Flatus: YES  BM:  No  Drain: (No drain)   Physical Exam:  General: Pt awake/alert/oriented x4 in no acute distress Eyes: PERRL, normal EOM.  Sclera clear.  No icterus Neuro: CN II-XII intact w/o focal sensory/motor deficits. Lymph: No head/neck/groin lymphadenopathy Psych:  No delerium/psychosis/paranoia HENT: Normocephalic, Mucus membranes moist.  No thrush Neck: Supple, No tracheal deviation Chest: No chest wall pain w good excursion CV:  Pulses intact.  Regular  rhythm MS: Normal AROM mjr joints.  No obvious deformity Abdomen: Soft.  Nondistended.  Nontender.  No evidence of peritonitis.  No incarcerated hernias.  Right groin and thigh wounds clean with some fibrotic base but no major purulence or necrosis or  cellulitis.  Sensitive but could tolerate dressing change today  Ext:  No deformity.  No mjr edema.  No cyanosis Skin: No petechiae / purpura  Results:   Cultures: Recent Results (from the past 720 hour(s))  Urine Culture     Status: None   Collection Time: 11/23/18 10:52 AM  Result Value Ref Range Status   MICRO NUMBER: [id number]  Final   SPECIMEN QUALITY: Adequate  Final   Sample Source URINE  Final   STATUS: FINAL  Final   ISOLATE 1:   Final    Single organism less than 10,000 CFU/mL isolated. These organisms, commonly found on external and internal genitalia, are considered colonizers. No further testing performed.  SARS Coronavirus 2 (CEPHEID - Performed in [place] hospital lab), Hosp Order     Status: None   Collection Time: 12/08/18  5:16 PM  Result Value Ref Range Status   SARS Coronavirus 2 NEGATIVE NEGATIVE Final    Comment: (NOTE) If result is NEGATIVE SARS-CoV-2 target nucleic acids are NOT DETECTED. The SARS-CoV-2 RNA is generally detectable in upper and lower  respiratory specimens during the acute phase of infection. The lowest  concentration of SARS-CoV-2 viral copies this assay can detect is 250  copies / mL. A negative result does not preclude SARS-CoV-2 infection  and should not be used as the sole basis for treatment or other  patient management decisions.  A negative result may occur with  improper specimen collection / handling, submission of specimen other  than nasopharyngeal swab, presence of viral mutation(s) within the  areas targeted by this assay, and inadequate number of viral copies  (<250 copies / mL). A negative result must be combined with clinical  observations, patient history, and epidemiological information. If result is POSITIVE SARS-CoV-2 target nucleic acids are DETECTED. The SARS-CoV-2 RNA is generally detectable in upper and lower  respiratory specimens dur ing the acute phase of infection.  Positive  results are indicative of  active infection with SARS-CoV-2.  Clinical  correlation with patient history and other diagnostic information is  necessary to determine patient infection status.  Positive results do  not rule out bacterial infection or co-infection with other viruses. If result is PRESUMPTIVE POSTIVE SARS-CoV-2 nucleic acids MAY BE PRESENT.   A presumptive positive result was obtained on the submitted specimen  and confirmed on repeat testing.  While 2019 novel coronavirus  (SARS-CoV-2) nucleic acids may be present in the submitted sample  additional confirmatory testing may be necessary for epidemiological  and / or clinical management purposes  to differentiate between  SARS-CoV-2 and other Sarbecovirus currently known to infect humans.  If clinically indicated additional testing with an alternate test  methodology [phone]) is advised. The SARS-CoV-2 RNA is generally  detectable in upper and lower respiratory sp ecimens during the acute  phase of infection. The expected result is Negative. Fact Sheet for Patients:  StrictlyIdeas.no Fact Sheet for Healthcare Providers: BankingDealers.co.za This test is not yet approved or cleared by the [place] FDA and has been authorized for detection and/or diagnosis of SARS-CoV-2 by FDA under an Emergency Use Authorization (EUA).  This EUA will remain in effect (meaning this test can be used) for the duration of the COVID-19 declaration under  Section 564(b)(1) of the Act, 21 U.S.C. section 360bbb-3(b)(1), unless the authorization is terminated or revoked sooner. Performed at [facility], [place] [address] [place], [place] [address]   Surgical pcr screen     Status: None   Collection Time: 12/08/18  6:55 PM  Result Value Ref Range Status   MRSA, PCR NEGATIVE NEGATIVE Final   Staphylococcus aureus NEGATIVE NEGATIVE Final    Comment: (NOTE) The Xpert SA Assay (FDA approved for NASAL  specimens in patients 17 years of age and older), is one component of a comprehensive surveillance program. It is not intended to diagnose infection nor to guide or monitor treatment. Performed at [facility], [place] [address], [place], [place] [address]   Aerobic/Anaerobic Culture (surgical/deep wound)     Status: None (Preliminary result)   Collection Time: 12/09/18 11:04 AM  Result Value Ref Range Status   Specimen Description   Final    ABSCESS RIGHT GROIN Performed at [facility], [place] [address], [place], [place] [address]    Special Requests   Final    PATIENT ON FOLLOWING ZYVOX, CLOFAZAMINE Performed at [facility], [place] [address], [place], [place] [address]    Gram Stain   Final    FEW WBC PRESENT,BOTH PMN AND MONONUCLEAR NO ORGANISMS SEEN Performed at [facility], [place] [address], [place], [place] [address]    Culture PENDING  Incomplete   Report Status PENDING  Incomplete  Aerobic/Anaerobic Culture (surgical/deep wound)     Status: None (Preliminary result)   Collection Time: 12/09/18 11:47 AM  Result Value Ref Range Status   Specimen Description   Final    TISSUE RIGHT GROIN Performed at [facility], [place] [address] [place], [place] [address]    Special Requests   Final    PATIENT ON FOLLOWING ZYVOX, CLOFAZAMINE Performed at [facility] [place], [place] [address], [place], [place] [address]    Gram Stain   Final    ABUNDANT WBC PRESENT,BOTH PMN AND MONONUCLEAR NO ORGANISMS SEEN Performed at [facility], [place] [address], [place],  [address]    Culture PENDING  Incomplete   Report Status PENDING  Incomplete    Labs: Results for orders placed or performed during the hospital encounter of 12/08/18 (from the past 48 hour(s))  SARS Coronavirus 2 (CEPHEID - Performed in [place] hospital lab), Hosp Order     Status: None   Collection Time: 12/08/18  5:16 PM  Result Value Ref Range     SARS Coronavirus 2 NEGATIVE NEGATIVE    Comment: (NOTE) If result is NEGATIVE SARS-CoV-2 target nucleic acids are NOT DETECTED. The SARS-CoV-2 RNA is generally detectable in upper and lower  respiratory specimens during the acute phase of infection. The lowest  concentration of SARS-CoV-2 viral copies this assay can detect is 250  copies / mL. A negative result does not preclude SARS-CoV-2 infection  and should not be used as the sole basis for treatment or other  patient management decisions.  A negative result may occur with  improper specimen collection / handling, submission of specimen other  than nasopharyngeal swab, presence of viral mutation(s) within the  areas targeted by this assay, and inadequate number of viral copies  (<250 copies / mL). A negative result must be combined with clinical  observations, patient history, and epidemiological information. If result is POSITIVE SARS-CoV-2 target nucleic acids are DETECTED. The SARS-CoV-2 RNA is generally detectable  in upper and lower  respiratory specimens dur ing the acute phase of infection.  Positive  results are indicative of active infection with SARS-CoV-2.  Clinical  correlation with patient history and other diagnostic information is  necessary to determine patient infection status.  Positive results do  not rule out bacterial infection or co-infection with other viruses. If result is PRESUMPTIVE POSTIVE SARS-CoV-2 nucleic acids MAY BE PRESENT.   A presumptive positive result was obtained on the submitted specimen  and confirmed on repeat testing.  While 2019 novel coronavirus  (SARS-CoV-2) nucleic acids may be present in the submitted sample  additional confirmatory testing may be necessary for epidemiological  and / or clinical management purposes  to differentiate between  SARS-CoV-2 and other Sarbecovirus currently known to infect humans.  If clinically indicated additional testing with an alternate test   methodology [phone]) is advised. The SARS-CoV-2 RNA is generally  detectable in upper and lower respiratory sp ecimens during the acute  phase of infection. The expected result is Negative. Fact Sheet for Patients:  StrictlyIdeas.no Fact Sheet for Healthcare Providers: BankingDealers.co.za This test is not yet approved or cleared by the [place] FDA and has been authorized for detection and/or diagnosis of SARS-CoV-2 by FDA under an Emergency Use Authorization (EUA).  This EUA will remain in effect (meaning this test can be used) for the duration of the COVID-19 declaration under Section 564(b)(1) of the Act, 21 U.S.C. section 360bbb-3(b)(1), unless the authorization is terminated or revoked sooner. Performed at [facility], [place] [address], [place], [place] [address]   CBC     Status: None   Collection Time: 12/08/18  5:35 PM  Result Value Ref Range   WBC 9.1 4.0 - 10.5 K/uL   RBC 4.11 3.87 - 5.11 MIL/uL   Hemoglobin 12.0 12.0 - 15.0 g/dL   HCT 38.3 36.0 - 46.0 %   MCV 93.2 80.0 - 100.0 fL   MCH 29.2 26.0 - 34.0 pg   MCHC 31.3 30.0 - 36.0 g/dL   RDW 13.2 11.5 - 15.5 %   Platelets 263 150 - 400 K/uL   nRBC 0.0 0.0 - 0.2 %    Comment: Performed at [facility], [place] [address], [place], [place] [address]  Comprehensive metabolic panel     Status: Abnormal   Collection Time: 12/08/18  5:35 PM  Result Value Ref Range   Sodium 134 (L) 135 - 145 mmol/L   Potassium 3.8 3.5 - 5.1 mmol/L   Chloride 103 98 - 111 mmol/L   CO2 21 (L) 22 - 32 mmol/L   Glucose, Bld 93 70 - 99 mg/dL   BUN 18 8 - 23 mg/dL   Creatinine, Ser 1.04 (H) 0.44 - 1.00 mg/dL   Calcium 9.1 8.9 - 10.3 mg/dL   Total Protein 6.9 6.5 - 8.1 g/dL   Albumin 3.7 3.5 - 5.0 g/dL   AST 26 15 - 41 U/L   ALT 28 0 - 44 U/L   Alkaline Phosphatase 93 38 - 126 U/L   Total Bilirubin 0.2 (L) 0.3 - 1.2 mg/dL   GFR calc non Af Amer 53 (L)  >60 mL/min   GFR calc Af Amer >60 >60 mL/min   Anion gap 10 5 - 15    Comment: Performed at [facility], [place] [address], [place], [place] [address]  Surgical pcr screen     Status: None   Collection Time: 12/08/18  6:55 PM  Result Value  Ref Range   MRSA, PCR NEGATIVE NEGATIVE   Staphylococcus aureus NEGATIVE NEGATIVE    Comment: (NOTE) The Xpert SA Assay (FDA approved for NASAL specimens in patients 43 years of age and older), is one component of a comprehensive surveillance program. It is not intended to diagnose infection nor to guide or monitor treatment. Performed at [facility], [place] [address] [place], [place] [address]   Aerobic/Anaerobic Culture (surgical/deep wound)     Status: None (Preliminary result)   Collection Time: 12/09/18 11:04 AM  Result Value Ref Range   Specimen Description      ABSCESS RIGHT GROIN Performed at [facility], [address], [place], [place] [address]    Special Requests      PATIENT ON FOLLOWING ZYVOX, CLOFAZAMINE Performed at [facility], [place] [address], [place], [place] [address]    Gram Stain      FEW WBC PRESENT,BOTH PMN AND MONONUCLEAR NO ORGANISMS SEEN Performed at [facility], [place] [address], [place], [place] [address]    Culture PENDING    Report Status PENDING   Aerobic/Anaerobic Culture (surgical/deep wound)     Status: None (Preliminary result)   Collection Time: 12/09/18 11:47 AM  Result Value Ref Range   Specimen Description      TISSUE RIGHT GROIN Performed at [facility], [place] [address] [place], [place] [address]    Special Requests      PATIENT ON FOLLOWING ZYVOX, CLOFAZAMINE Performed at [facility], [place] [address] [place], [place] [address]    Gram Stain      ABUNDANT WBC PRESENT,BOTH PMN AND MONONUCLEAR NO ORGANISMS SEEN Performed at [facility], [place] [address], [place], [place] [address]    Culture  PENDING    Report Status PENDING     Imaging / Studies: No results found.  Medications / Allergies: per chart  Antibiotics: Anti-infectives (From admission, onward)   Start     Dose/Rate Route Frequency Ordered Stop   12/09/18 1030  amikacin (AMIKIN) 375 mg in dextrose 5 % 100 mL IVPB  Status:  Discontinued     5 mg/kg  75.8 kg 101.5 mL/hr over 60 Minutes Intravenous On call to O.R. 12/09/18 6606 12/09/18 1009   12/09/18 1030  vancomycin (VANCOCIN) 1,500 mg in sodium chloride 0.9 % 500 mL IVPB  Status:  Discontinued    Note to Pharmacy:  Pharmacy may adjust dosing strength, interval, or rate of medication as needed for optimal therapy for the patient  Send with patient on call to the OR.  Anesthesia to complete antibiotic administration <39min prior to incision per [facility].   1,500 mg 250 mL/hr over 120 Minutes Intravenous On call to O.R. 12/09/18 3016 12/09/18 1009   12/09/18 0600  clindamycin (CLEOCIN) IVPB 900 mg  Status:  Discontinued     900 mg 100 mL/hr over 30 Minutes Intravenous On call to O.R. 12/08/18 1639 12/09/18 0953   12/09/18 0600  gentamicin (GARAMYCIN) 380 mg in dextrose 5 % 100 mL IVPB  Status:  Discontinued     5 mg/kg  75.8 kg 109.5 mL/hr over 60 Minutes Intravenous On call to O.R. 12/08/18 1639 12/09/18 0953   12/08/18 2000  linezolid (ZYVOX) tablet 600 mg     600 mg Oral 2 times daily 12/08/18 1730          Note: Portions of this report may have been transcribed using voice recognition software. Every  effort was made to ensure accuracy; however, inadvertent computerized transcription errors may be present.   Any transcriptional errors that result from this process are unintentional.     [name], MD, FACS, MASCRS Gastrointestinal and Minimally Invasive Surgery    [address], [place],  [address] [phone] Main / Paging [phone] Fax

## 2018-12-10 NOTE — Care Management Important Message (Signed)
Important Message  Patient Details IM Letter given to Velva Harman to present to the Patient Name: ZAKYAH YANES MRN: [id number] Date of Birth: 1945-12-20   Medicare Important Message Given:  Yes    Kerin Salen 12/10/2018, 11:34 AMImportant Message  Patient Details  Name: ROSALEIGH BRAZZEL MRN: [id number] Date of Birth: Mar 15, 1946   Medicare Important Message Given:  Yes    Kerin Salen 12/10/2018, 11:34 AM

## 2018-12-10 NOTE — Progress Notes (Signed)
Subjective: No new complaints   Antibiotics:  Anti-infectives (From admission, onward)   Start     Dose/Rate Route Frequency Ordered Stop   12/09/18 1030  amikacin (AMIKIN) 375 mg in dextrose 5 % 100 mL IVPB  Status:  Discontinued     5 mg/kg  75.8 kg 101.5 mL/hr over 60 Minutes Intravenous On call to O.R. 12/09/18 0240 12/09/18 1009   12/09/18 1030  vancomycin (VANCOCIN) 1,500 mg in sodium chloride 0.9 % 500 mL IVPB  Status:  Discontinued    Note to Pharmacy:  Pharmacy may adjust dosing strength, interval, or rate of medication as needed for optimal therapy for the patient  Send with patient on call to the OR.  Anesthesia to complete antibiotic administration <80min prior to incision per [facility].   1,500 mg 250 mL/hr over 120 Minutes Intravenous On call to O.R. 12/09/18 9735 12/09/18 1009   12/09/18 0600  clindamycin (CLEOCIN) IVPB 900 mg  Status:  Discontinued     900 mg 100 mL/hr over 30 Minutes Intravenous On call to O.R. 12/08/18 1639 12/09/18 0953   12/09/18 0600  gentamicin (GARAMYCIN) 380 mg in dextrose 5 % 100 mL IVPB  Status:  Discontinued     5 mg/kg  75.8 kg 109.5 mL/hr over 60 Minutes Intravenous On call to O.R. 12/08/18 1639 12/09/18 0953   12/08/18 2000  linezolid (ZYVOX) tablet 600 mg     600 mg Oral 2 times daily 12/08/18 1730        Medications: Scheduled Meds: . acetaminophen  1,000 mg Oral QHS  . acetaminophen  1,000 mg Oral TID  . calcium carbonate  2 tablet Oral Daily  . Chlorhexidine Gluconate Cloth  6 each Topical Once  . cholecalciferol  5,000 Units Oral Daily  . diltiazem (CARDIZEM CD) 360 mg  360 mg Oral Daily  . docusate sodium  200 mg Oral Daily  . fluticasone  1 spray Each Nare Daily  . gabapentin  100 mg Oral TID  . Investigational - Study Medication  100 mg Oral Q supper  . linezolid  600 mg Oral BID  . lip balm  1 application Topical BID  . LORazepam  0.5 mg Oral Daily  . LORazepam  1 mg Oral QHS  .  mometasone-formoterol  2 puff Inhalation BID  . montelukast  10 mg Oral QHS  . naproxen sodium  412.5 mg Oral q morning - 10a  . pantoprazole  40 mg Oral Daily  . polyethylene glycol  17 g Oral Daily  . polyvinyl alcohol  2 drop Both Eyes BID  . sodium chloride flush  3 mL Intravenous Q12H  . sodium chloride flush  3 mL Intravenous Q12H  . sucralfate  1 g Oral TID WC & HS  . umeclidinium bromide  1 puff Inhalation Daily  . vitamin B-12  2,500 mcg Oral Daily   Continuous Infusions: . sodium chloride    . lactated ringers     PRN Meds:.sodium chloride, acetaminophen **OR** acetaminophen, albuterol, alum & mag hydroxide-simeth, hydrocortisone, hydrocortisone cream, HYDROmorphone, lactated ringers, magic mouthwash, menthol-cetylpyridinium, methocarbamol, ondansetron **OR** ondansetron (ZOFRAN) IV, phenol, sodium chloride flush    Objective: Weight change:   Intake/Output Summary (Last 24 hours) at 12/10/2018 1456 Last data filed at 12/10/2018 0600 Gross per 24 hour  Intake 600 ml  Output 1000 ml  Net -400 ml   Blood pressure 124/67, pulse 71, temperature 99 F (37.2 C), temperature source Oral, resp. rate  16, SpO2 100 %. Temp:  [98.4 F (36.9 C)-99 F (37.2 C)] 99 F (37.2 C) (05/08 1047) Pulse Rate:  [63-75] 71 (05/08 1047) Resp:  [16-18] 16 (05/08 1047) BP: (118-124)/(59-77) 124/67 (05/08 1047) SpO2:  [96 %-100 %] 100 % (05/08 1047)  Physical Exam: Chaperone present General: Alert and awake, oriented x3, not in any acute distress. HEENT: anicteric sclera, EOMI CVS regular rate, normal  Chest: , no wheezing, no respiratory distress Abdomen: soft non-distended,  Neuro: nonfocal  CBC:    BMET Recent Labs    12/08/18 1735  NA 134*  K 3.8  CL 103  CO2 21*  GLUCOSE 93  BUN 18  CREATININE 1.04*  CALCIUM 9.1     Liver Panel  Recent Labs    12/08/18 1735  PROT 6.9  ALBUMIN 3.7  AST 26  ALT 28  ALKPHOS 93  BILITOT 0.2*       Sedimentation Rate No  results for input(s): ESRSEDRATE in the last 72 hours. C-Reactive Protein No results for input(s): CRP in the last 72 hours.  Micro Results: Recent Results (from the past 720 hour(s))  Urine Culture     Status: None   Collection Time: 11/23/18 10:52 AM  Result Value Ref Range Status   MICRO NUMBER: [id number]  Final   SPECIMEN QUALITY: Adequate  Final   Sample Source URINE  Final   STATUS: FINAL  Final   ISOLATE 1:   Final    Single organism less than 10,000 CFU/mL isolated. These organisms, commonly found on external and internal genitalia, are considered colonizers. No further testing performed.  SARS Coronavirus 2 (CEPHEID - Performed in [facility] lab), Hosp Order     Status: None   Collection Time: 12/08/18  5:16 PM  Result Value Ref Range Status   SARS Coronavirus 2 NEGATIVE NEGATIVE Final    Comment: (NOTE) If result is NEGATIVE SARS-CoV-2 target nucleic acids are NOT DETECTED. The SARS-CoV-2 RNA is generally detectable in upper and lower  respiratory specimens during the acute phase of infection. The lowest  concentration of SARS-CoV-2 viral copies this assay can detect is 250  copies / mL. A negative result does not preclude SARS-CoV-2 infection  and should not be used as the sole basis for treatment or other  patient management decisions.  A negative result may occur with  improper specimen collection / handling, submission of specimen other  than nasopharyngeal swab, presence of viral mutation(s) within the  areas targeted by this assay, and inadequate number of viral copies  (<250 copies / mL). A negative result must be combined with clinical  observations, patient history, and epidemiological information. If result is POSITIVE SARS-CoV-2 target nucleic acids are DETECTED. The SARS-CoV-2 RNA is generally detectable in upper and lower  respiratory specimens dur ing the acute phase of infection.  Positive  results are indicative of active infection with  SARS-CoV-2.  Clinical  correlation with patient history and other diagnostic information is  necessary to determine patient infection status.  Positive results do  not rule out bacterial infection or co-infection with other viruses. If result is PRESUMPTIVE POSTIVE SARS-CoV-2 nucleic acids MAY BE PRESENT.   A presumptive positive result was obtained on the submitted specimen  and confirmed on repeat testing.  While 2019 novel coronavirus  (SARS-CoV-2) nucleic acids may be present in the submitted sample  additional confirmatory testing may be necessary for epidemiological  and / or clinical management purposes  to differentiate between  SARS-CoV-2 and  other Sarbecovirus currently known to infect humans.  If clinically indicated additional testing with an alternate test  methodology [phone]) is advised. The SARS-CoV-2 RNA is generally  detectable in upper and lower respiratory sp ecimens during the acute  phase of infection. The expected result is Negative. Fact Sheet for Patients:  StrictlyIdeas.no Fact Sheet for Healthcare Providers: BankingDealers.co.za This test is not yet approved or cleared by the [place] FDA and has been authorized for detection and/or diagnosis of SARS-CoV-2 by FDA under an Emergency Use Authorization (EUA).  This EUA will remain in effect (meaning this test can be used) for the duration of the COVID-19 declaration under Section 564(b)(1) of the Act, 21 U.S.C. section 360bbb-3(b)(1), unless the authorization is terminated or revoked sooner. Performed at [facility], [place] [address] [place], [place] [address]   Surgical pcr screen     Status: None   Collection Time: 12/08/18  6:55 PM  Result Value Ref Range Status   MRSA, PCR NEGATIVE NEGATIVE Final   Staphylococcus aureus NEGATIVE NEGATIVE Final    Comment: (NOTE) The Xpert SA Assay (FDA approved for NASAL specimens in patients 64  years of age and older), is one component of a comprehensive surveillance program. It is not intended to diagnose infection nor to guide or monitor treatment. Performed at [facility], [place] [address] [place], [place] [address]   Aerobic/Anaerobic Culture (surgical/deep wound)     Status: None (Preliminary result)   Collection Time: 12/09/18 11:04 AM  Result Value Ref Range Status   Specimen Description   Final    ABSCESS RIGHT GROIN Performed at [facility], [place] [address] [place], [place] [address]    Special Requests   Final    PATIENT ON FOLLOWING ZYVOX, CLOFAZAMINE Performed at [facility], [place] [address], [place], [place] [address]    Gram Stain   Final    FEW WBC PRESENT,BOTH PMN AND MONONUCLEAR NO ORGANISMS SEEN    Culture   Final    CULTURE REINCUBATED FOR BETTER GROWTH Performed at [facility], [place] [address], [place], [place] [address]    Report Status PENDING  Incomplete  Aerobic/Anaerobic Culture (surgical/deep wound)     Status: None (Preliminary result)   Collection Time: 12/09/18 11:47 AM  Result Value Ref Range Status   Specimen Description   Final    TISSUE RIGHT GROIN Performed at [facility], [place] [address] [place], [place] [address]    Special Requests   Final    PATIENT ON FOLLOWING ZYVOX, CLOFAZAMINE Performed at [facility], [place] [address] [place], [place] [address]    Gram Stain   Final    ABUNDANT WBC PRESENT,BOTH PMN AND MONONUCLEAR NO ORGANISMS SEEN    Culture   Final    CULTURE REINCUBATED FOR BETTER GROWTH Performed at [facility], [place] [address] [place], [place] [address]    Report Status PENDING  Incomplete  Acid Fast Smear (AFB)     Status: None   Collection Time: 12/09/18 11:47 AM  Result Value Ref Range Status   AFB Specimen Processing Comment  Final    Comment: Tissue Grinding and Digestion/Decontamination   Acid Fast Smear Negative  Final     Comment: (NOTE) Performed At: [facility] [address] [place], [place] [id number] Rush Farmer MD HW:[id number]    Source (AFB) TISSUE  Final    Comment: RT GROIN Performed at [facility]  [facility], [place] [address] [place], [place] [address]     Studies/Results: No results found.    Assessment/Plan:  INTERVAL HISTORY:   Ready for DC when I saw her  Principal Problem:   Soft tissue infection - Mycobaterium fortuitum   Active Problems:   Depression with anxiety   DDD (degenerative disc disease), lumbar   Cellulitis of right groin   Mycobacterium fortuitum infection   Thigh abscess    Catherine [name] is a 73 y.o. female with  Groin, thigh absceses due to M fortuitum sp I and D by Dr. Johney Maine  --greatly appreciate Dr. Johney Maine critical intervention here --continue Zyvox and clofazamine  I will reschedule her visit for labs with me on May 21st w  CBC w diff on Zyvox  Unfortunately organism is Macrolide R so if cannot get mileage out of zyvox or tedezolid as alternative will need to go to IV Tygacil o perhaps 2 beta lactams that are partially active with imiepenem and cefoxitin. I reallly dont wan to use amikacin with her having already had hearing loss     LOS: 2 days   Alcide Evener 12/10/2018, 2:56 PM

## 2018-12-10 NOTE — Telephone Encounter (Signed)
I have scheduled pt for labs on May 21st at [facility] and then with E visit option for in person visit in June  Can someone tell her of the 2 dates?

## 2018-12-10 NOTE — Discharge Summary (Signed)
Physician Discharge Summary  Catherine [name] SJG:[id number] DOB: 08/24/1945 DOA: 12/08/2018  PCP: Jonathon Jordan, MD  Admit date: 12/08/2018 Discharge date: 12/10/2018  Admitted From: Home/ID office Disposition: Home with home health  Recommendations for Outpatient Follow-up:  1. Follow up with Dr. Michael Boston from surgery 2 weeks 2. Please obtain BMP/CBC in one week 3. Please follow up on the following pending results:  Home Health: yes  Equipment/Devices: none  Discharge Condition: Stable CODE STATUS: Full code Diet recommendation: Regular  Brief/Interim Summary: 73 y.o.femalewith medical history significant forCOPD, DDD, Aorticaneurysm, anxiety/depression, GERD, hypertension, migraine, osteoporosis/osteopenia presented to [facility] from infectious disease Dr. Arlyss Queen office for worsening soft tissue infection in the right inguinal area.Patient complains of soreness in right groin along with blister, open wound. She hasassociated chillsandfatigue but denies any fever, chest pain, nausea, vomiting, abdominal pain,diarrheaorconstipation. 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 Has grown Mycobacterium in the wound culture, was seen by ID today initiated on Zyvox and clofazimine and is being planned for incision and drainage in the morning by general surgery so she is being admitted under hospitalist today. Patient denies any travel history, denies any contact with her patient with known COVID 19 orfever etcs. Patient was admitted on the right groin and thigh abscess, treated with clofazimine and Zyvox as per ID.   Seen by surgery and underwent debridement. At this time patient medically stable cleared by surgery for discharge home to continue on her home antibiotics as per infectious disease and wound care dressing for which home health RN will be set up.  Discharge Diagnoses:  Principal Problem:   Soft tissue infection - Mycobaterium fortuitum   Active Problems:   Depression with anxiety   DDD (degenerative disc disease), lumbar   Cellulitis of right groin   Mycobacterium fortuitum infection   Thigh abscess  Soft tissue infectionon right inguinal area withMycobaterium fortuitum with cellulitis and abscess :Patient has failed outpatient antibiotics. As per ID recommendation cont on clofazimine and Zyvox. S/p debridement by general surgery.this morning seen and okay for discharge, continue packing, home health RN will be set up.  Follow-up with ID and surgery as outpatient.    Depression with anxiety:Stable.  DDD/osteoporosis/osteopenia,on 6 monthly Prolia at home. Continue vitamin D calcium supplementation  COPDnot on home oxygen, stable, continue home symbicort and Spiriva inhaler, along with bronchodilators. Her lungs are clear.  Aneurysmal dilatation of ascending aorta:Avoid fluoroquinolones.Patient has evaluation in March of last year w "Aneurysmal dilatation of the ascending thoracic aorta 4.0 cm transverse"and is due to see her cardiologist coming June, currently denies any symptoms of chest pain syncope.   GERD: contPPI  Hypertension: controlled on home Cardizem.  Migraine,stable. Continue PRN Tylenol holding triptan andtramadol.   Discharge Instructions  Discharge Instructions    Call MD for:   Complete by:  As directed    FEVER > 101.5 F (Temperatures <101.31F can occasionally happen and are not significant)   Call MD for:  extreme fatigue   Complete by:  As directed    Call MD for:  persistant dizziness or light-headedness   Complete by:  As directed     Call MD for:  persistant nausea and  vomiting   Complete by:  As directed    Call MD for:  redness, tenderness, or signs of infection (pain, swelling, redness, odor or green/yellow discharge around incision site)   Complete by:  As directed    Call MD for:  severe uncontrolled pain   Complete by:  As directed    Diet - low sodium heart healthy   Complete by:  As directed    Follow a light diet the first few days at home.    If you feel full, bloated, or constipated, stay on a liquid diet until you feel better and not constipated. Gradually get back to a solid diet.  Avoid fast food or heavy meals the first week as you are more likely to get nauseated. It is expected for your digestive tract to need a few months to get back to normal.   Diet - low sodium heart healthy   Complete by:  As directed    Discharge instructions   Complete by:  As directed    Please call call MD or return to ER for similar recurring problem, nausea/vomiting, uncontrolled pain, abdominal pain chest pain, shortness of breath, fever. drainage from wound Please follow-up your doctor as instructed in a week time and call the office for appointment. Please avoid alcohol, smoking, or any other illicit substance.   Discharge wound care:   Complete by:  As directed    You have an open wound.  In general, it is encouraged that you remove your dressing and packing, shower with soap & water, and replace your dressing once a day.   Pack the wound with clean gauze moistened with normal (0.9%) saline or KY gel to keep the wound moist & uninfected.    .   Eventually your body will heal & pull the open wound closed over the next few months.  Raw open wounds will occasionally bleed or secrete yellow drainage until it heals closed.   Pressure on the dressing for 30 minutes will stop most wound bleeding   Driving Restrictions   Complete by:  As directed    You may drive when you are no longer taking narcotic prescription pain  medication, you can comfortably wear a seatbelt, and you can safely make sudden turns/stops to protect yourself without hesitating due to pain.   Increase activity slowly   Complete by:  As directed    Increase activity slowly   Complete by:  As directed    Lifting restrictions   Complete by:  As directed    Start light daily activities --- self-care, walking, climbing stairs- beginning the day after surgery.   Gradually increase activities as tolerated.   Control your pain to be active.   Stop when you are tired.   Ideally, walk several times a day, eventually an hour a day.   Most people are back to most day-to-day activities in a few weeks.  It takes 4-8 weeks to get back to unrestricted, intense activity. If you can walk 30 minutes without difficulty, it is safe to try more intense activity such as jogging, treadmill, bicycling, low-impact aerobics, swimming, etc. Save the most intensive and strenuous activity for last (Usually 4-8 weeks after surgery) such as sit-ups, heavy lifting, contact sports, etc.   Refrain from any intense heavy lifting or straining until you are off narcotics for pain control.  You will have off days, but things should improve week-by-week. DO NOT PUSH THROUGH PAIN.   Let pain be your guide:  If it hurts to do something, don't do it.  Pain is your body warning you to avoid that activity for another week until the pain goes down.   May shower / Bathe   Complete by:  As directed    May walk up steps   Complete by:  As directed    Sexual Activity Restrictions   Complete by:  As directed    You may have sexual intercourse when it is comfortable. If it hurts to do something, stop.     Allergies as of 12/10/2018      Reactions   Ciprofloxacin Hcl Other (See Comments)   Aortic aneurysm per MD   Metoprolol Other (See Comments), Cough   and wheezing   Penicillins Shortness Of Breath, Rash, Other (See Comments)   Has patient had a PCN reaction causing immediate  rash, facial/tongue/throat swelling, SOB or lightheadedness with hypotension: Yes Has patient had a PCN reaction causing severe rash involving mucus membranes or skin necrosis: No Has patient had a PCN reaction that required hospitalization: Yes - MD office Has patient had a PCN reaction occurring within the last 10 years: No If all of the above answers are "NO", then may proceed with Cephalosporin use.   Oxycodone Other (See Comments)   Nightmares.  Tolerates tramadol   Advair Diskus [fluticasone-salmeterol] Anxiety   Sulfa Antibiotics Rash      Medication List    TAKE these medications   acetaminophen 500 MG tablet Commonly known as:  TYLENOL Take 1,000 mg by mouth 2 (two) times a day.   albuterol 108 (90 Base) MCG/ACT inhaler Commonly known as:  VENTOLIN HFA Inhale 2 puffs into the lungs every 4 (four) hours as needed for wheezing or shortness of breath.   albuterol 0.63 MG/3ML nebulizer solution Commonly known as:  ACCUNEB Take 1 ampule by nebulization 3 (three) times daily as needed for wheezing.   AMBULATORY NON FORMULARY MEDICATION Take 2 capsules by mouth daily. Medication Name: clofazimine   budesonide-formoterol 160-4.5 MCG/ACT inhaler Commonly known as:  SYMBICORT Inhale 2 puffs into the lungs 2 (two) times daily.   calcium elemental as carbonate 400 MG chewable tablet Commonly known as:  BARIATRIC TUMS ULTRA Chew 2 tablets by mouth daily.   carboxymethylcellulose 0.5 % Soln Commonly known as:  REFRESH PLUS Place 2 drops into both eyes 3 (three) times daily.   Refresh Liquigel 1 % Gel Generic drug:  Carboxymethylcellulose Sodium Place 1 drop into both eyes at bedtime.   denosumab 60 MG/ML Sosy injection Commonly known as:  PROLIA Inject 60 mg into the skin every 6 (six) months.   docusate sodium 100 MG capsule Commonly known as:  COLACE Take 200 mg by mouth daily.   esomeprazole 20 MG capsule Commonly known as:  NEXIUM Take 20 mg by mouth daily.    fluticasone 50 MCG/ACT nasal spray Commonly known as:  FLONASE Place 1 spray into both nostrils daily.   gabapentin 100 MG capsule Commonly known as:  NEURONTIN Take 100 mg by mouth at bedtime.   HYDROmorphone 2 MG tablet Commonly known as:  DILAUDID Take 1-2 tablets (2-4 mg total) by mouth every 8 (eight) hours as needed for severe pain.   linezolid 600 MG tablet Commonly known as:  ZYVOX Take 1 tablet (600 mg total) by mouth 2 (two) times daily.   LORazepam 1 MG tablet Commonly known as:  ATIVAN Take 0.5-1 mg by mouth See admin instructions. Take 0.5 mg by mouth in the morning and  take 1 mg by mouth at bedtime   montelukast 10 MG tablet Commonly known as:  SINGULAIR Take 10 mg by mouth at bedtime.   naproxen sodium 220 MG tablet Commonly known as:  ALEVE Take 2 tablets (440 mg total) by mouth every morning.   polyethylene glycol 17 g packet Commonly known as:  MIRALAX / GLYCOLAX Take 17 g by mouth daily.   Spiriva HandiHaler 18 MCG inhalation capsule Generic drug:  tiotropium Place 18 mcg into inhaler and inhale daily.   Taztia XT 360 MG 24 hr capsule Generic drug:  diltiazem Take 360 mg by mouth daily.   VITAMIN B-12 PO Take 2,500 mcg by mouth daily.   Vitamin D3 125 MCG (5000 UT) Caps Take 5,000 Units by mouth daily.            Discharge Care Instructions  (From admission, onward)         Start     Ordered   12/10/18 0000  Discharge wound care:    Comments:  You have an open wound.  In general, it is encouraged that you remove your dressing and packing, shower with soap & water, and replace your dressing once a day.   Pack the wound with clean gauze moistened with normal (0.9%) saline or KY gel to keep the wound moist & uninfected.    .   Eventually your body will heal & pull the open wound closed over the next few months.  Raw open wounds will occasionally bleed or secrete yellow drainage until it heals closed.   Pressure on the dressing for 30  minutes will stop most wound bleeding   12/10/18 0854         Follow-up Information    Michael Boston, MD. Schedule an appointment as soon as possible for a visit in 2 week(s).   Specialty:  General Surgery Why:  To check on your wound Contact information: [place] [address] [phone]        Jonathon Jordan, MD Follow up in 1 week(s).   Specialty:  Family Medicine Contact information: [address] [place] [address] [phone]          Allergies  Allergen Reactions  . Ciprofloxacin Hcl Other (See Comments)    Aortic aneurysm per MD  . Metoprolol Other (See Comments) and Cough    and wheezing  . Penicillins Shortness Of Breath, Rash and Other (See Comments)    Has patient had a PCN reaction causing immediate rash, facial/tongue/throat swelling, SOB or lightheadedness with hypotension: Yes Has patient had a PCN reaction causing severe rash involving mucus membranes or skin necrosis: No Has patient had a PCN reaction that required hospitalization: Yes - MD office Has patient had a PCN reaction occurring within the last 10 years: No If all of the above answers are "NO", then may proceed with Cephalosporin use.   Marland Kitchen Oxycodone Other (See Comments)    Nightmares.  Tolerates tramadol  . Advair Diskus [Fluticasone-Salmeterol] Anxiety  . Sulfa Antibiotics Rash    Consultations:  Infectious disease  General surgery    Procedures/Studies: 1. Incision and drainage of the right groin wound abscess 2.Ct Abdomen Pelvis W Contrast  Result Date: 11/27/2018 CLINICAL DATA:  Mass lump in groin. Recent surgery for mass removal 10/30/2018. Concern mesh may be infected, abscess. EXAM: CT ABDOMEN AND PELVIS WITH CONTRAST TECHNIQUE: Multidetector CT imaging of the abdomen and pelvis was performed using the standard protocol following  bolus administration of intravenous contrast. CONTRAST:  157mL ISOVUE-300 IOPAMIDOL (ISOVUE-300)  INJECTION 61% COMPARISON:  05/31/2018 FINDINGS: Lower chest: No acute abnormality Hepatobiliary: No focal liver abnormality is seen. Status post cholecystectomy. No biliary dilatation. Pancreas: No focal abnormality or ductal dilatation. Spleen: No focal abnormality.  Normal size. Adrenals/Urinary Tract: No adrenal abnormality. No focal renal abnormality. No stones or hydronephrosis. Urinary bladder is unremarkable. Stomach/Bowel: Large stool burden in the colon. No evidence of bowel obstruction. Stomach, small bowel decompressed, unremarkable. Vascular/Lymphatic: Aortic atherosclerosis. No enlarged abdominal or pelvic lymph nodes. Reproductive: Prior hysterectomy.  No adnexal masses. Other: No free fluid or free air. There is a fluid collection within the right inguinal region, extending medially into the right perineal soft tissues. Greatest length measures 6.4 cm. Greatest thickness measures 1.4 cm. This is concerning for abscess. There are multiple enhancing areas in the adductor muscles, best seen coronal image 20 7-29 concerning for small intramuscular abscesses. Musculoskeletal: No acute bony abnormality. IMPRESSION: Fluid collection in the right inguinal region, extending medially into the upper right perineal soft tissues concerning for postoperative abscess, 6.4 x 1.4 cm. Areas of enhancement also noted in the adjacent abductor muscles which on the coronal imaging appear to represent small intramuscular abscesses. Large stool burden in the colon. Prior cholecystectomy and hysterectomy. Aortic atherosclerosis. Electronically Signed   By: Rolm Baptise M.D.   On: 11/27/2018 19:08   Subjective: Resting well, feels well, on packed well with dressing on the right groin. Denies nausea vomiting fevers chills.  Discharge Exam: Vitals:   12/10/18 0450 12/10/18 1047  BP: 123/77 124/67  Pulse: 75 71  Resp: 18 16  Temp: 98.7 F (37.1 C) 99 F (37.2 C)  SpO2: 98% 100%   Vitals:   12/09/18 2038 12/10/18  0204 12/10/18 0450 12/10/18 1047  BP: (!) 120/59 124/60 123/77 124/67  Pulse: 66 70 75 71  Resp: 18 18 18 16   Temp: 98.4 F (36.9 C) 98.5 F (36.9 C) 98.7 F (37.1 C) 99 F (37.2 C)  TempSrc: Oral Oral Oral Oral  SpO2: 97% 96% 98% 100%    General: Pt is alert, awake, not in acute distress Cardiovascular: RRR, S1/S2 +, no rubs, no gallops Respiratory: CTA bilaterally, no wheezing, no rhonchi Abdominal: Soft, NT, ND, bowel sounds + Extremities: no edema, no cyanosis   The results of significant diagnostics from this hospitalization (including imaging, microbiology, ancillary and laboratory) are listed below for reference.     Microbiology: Recent Results (from the past 240 hour(s))  SARS Coronavirus 2 (CEPHEID - Performed in [place] hospital lab), Hosp Order     Status: None   Collection Time: 12/08/18  5:16 PM  Result Value Ref Range Status   SARS Coronavirus 2 NEGATIVE NEGATIVE Final    Comment: (NOTE) If result is NEGATIVE SARS-CoV-2 target nucleic acids are NOT DETECTED. The SARS-CoV-2 RNA is generally detectable in upper and lower  respiratory specimens during the acute phase of infection. The lowest  concentration of SARS-CoV-2 viral copies this assay can detect is 250  copies / mL. A negative result does not preclude SARS-CoV-2 infection  and should not be used as the sole basis for treatment or other  patient management decisions.  A negative result may occur with  improper specimen collection / handling, submission of specimen other  than nasopharyngeal swab, presence of viral mutation(s) within the  areas targeted by this assay, and inadequate number of viral copies  (<250 copies / mL). A negative result must be  combined with clinical  observations, patient history, and epidemiological information. If result is POSITIVE SARS-CoV-2 target nucleic acids are DETECTED. The SARS-CoV-2 RNA is generally detectable in upper and lower  respiratory specimens dur ing  the acute phase of infection.  Positive  results are indicative of active infection with SARS-CoV-2.  Clinical  correlation with patient history and other diagnostic information is  necessary to determine patient infection status.  Positive results do  not rule out bacterial infection or co-infection with other viruses. If result is PRESUMPTIVE POSTIVE SARS-CoV-2 nucleic acids MAY BE PRESENT.   A presumptive positive result was obtained on the submitted specimen  and confirmed on repeat testing.  While 2019 novel coronavirus  (SARS-CoV-2) nucleic acids may be present in the submitted sample  additional confirmatory testing may be necessary for epidemiological  and / or clinical management purposes  to differentiate between  SARS-CoV-2 and other Sarbecovirus currently known to infect humans.  If clinically indicated additional testing with an alternate test  methodology [phone]) is advised. The SARS-CoV-2 RNA is generally  detectable in upper and lower respiratory sp ecimens during the acute  phase of infection. The expected result is Negative. Fact Sheet for Patients:  StrictlyIdeas.no Fact Sheet for Healthcare Providers: BankingDealers.co.za This test is not yet approved or cleared by the [place] FDA and has been authorized for detection and/or diagnosis of SARS-CoV-2 by FDA under an Emergency Use Authorization (EUA).  This EUA will remain in effect (meaning this test can be used) for the duration of the COVID-19 declaration under Section 564(b)(1) of the Act, 21 U.S.C. section 360bbb-3(b)(1), unless the authorization is terminated or revoked sooner. Performed at [facility], [place] [address] [place], [place] [address]   Surgical pcr screen     Status: None   Collection Time: 12/08/18  6:55 PM  Result Value Ref Range Status   MRSA, PCR NEGATIVE NEGATIVE Final   Staphylococcus aureus NEGATIVE NEGATIVE Final     Comment: (NOTE) The Xpert SA Assay (FDA approved for NASAL specimens in patients 31 years of age and older), is one component of a comprehensive surveillance program. It is not intended to diagnose infection nor to guide or monitor treatment. Performed at [facility], [place] [address] [place], [place] [address]   Aerobic/Anaerobic Culture (surgical/deep wound)     Status: None (Preliminary result)   Collection Time: 12/09/18 11:04 AM  Result Value Ref Range Status   Specimen Description   Final    ABSCESS RIGHT GROIN Performed at [facility], [place] [address], [place], [place] [address]    Special Requests   Final    PATIENT ON FOLLOWING ZYVOX, CLOFAZAMINE Performed at [facility], [place] [address], [place], [place] [address]    Gram Stain   Final    FEW WBC PRESENT,BOTH PMN AND MONONUCLEAR NO ORGANISMS SEEN    Culture   Final    CULTURE REINCUBATED FOR BETTER GROWTH Performed at [facility], [place] [address] [place], [place] [address]    Report Status PENDING  Incomplete  Aerobic/Anaerobic Culture (surgical/deep wound)     Status: None (Preliminary result)   Collection Time: 12/09/18 11:47 AM  Result Value Ref Range Status   Specimen Description   Final    TISSUE RIGHT GROIN Performed at [facility], [place] [address], [place],  [address]    Special Requests   Final    PATIENT ON FOLLOWING ZYVOX, CLOFAZAMINE Performed at  [facility], [place] [address], [place], [place] [address]    Gram Stain   Final    ABUNDANT WBC PRESENT,BOTH PMN AND MONONUCLEAR NO ORGANISMS SEEN    Culture   Final    CULTURE REINCUBATED FOR BETTER GROWTH Performed at [facility], [place] [address] [place], [place] [address]    Report Status PENDING  Incomplete     Labs: BNP (last 3 results) No results for input(s): BNP in the last 8760 hours. Basic Metabolic Panel: Recent Labs  Lab 12/08/18 1735  NA 134*  K  3.8  CL 103  CO2 21*  GLUCOSE 93  BUN 18  CREATININE 1.04*  CALCIUM 9.1   Liver Function Tests: Recent Labs  Lab 12/08/18 1735  AST 26  ALT 28  ALKPHOS 93  BILITOT 0.2*  PROT 6.9  ALBUMIN 3.7   No results for input(s): LIPASE, AMYLASE in the last 168 hours. No results for input(s): AMMONIA in the last 168 hours. CBC: Recent Labs  Lab 12/08/18 1735  WBC 9.1  HGB 12.0  HCT 38.3  MCV 93.2  PLT 263   Cardiac Enzymes: No results for input(s): CKTOTAL, CKMB, CKMBINDEX, TROPONINI in the last 168 hours. BNP: Invalid input(s): POCBNP CBG: No results for input(s): GLUCAP in the last 168 hours. D-Dimer No results for input(s): DDIMER in the last 72 hours. Hgb A1c No results for input(s): HGBA1C in the last 72 hours. Lipid Profile No results for input(s): CHOL, HDL, LDLCALC, TRIG, CHOLHDL, LDLDIRECT in the last 72 hours. Thyroid function studies No results for input(s): TSH, T4TOTAL, T3FREE, THYROIDAB in the last 72 hours.  Invalid input(s): FREET3 Anemia work up No results for input(s): VITAMINB12, FOLATE, FERRITIN, TIBC, IRON, RETICCTPCT in the last 72 hours. Urinalysis    Component Value Date/Time   COLORURINE YELLOW 11/23/2018 1052   APPEARANCEUR CLEAR 11/23/2018 1052   LABSPEC 1.011 11/23/2018 1052   PHURINE < OR = 5.0 11/23/2018 1052   GLUCOSEU NEGATIVE 11/23/2018 1052   HGBUR NEGATIVE 11/23/2018 1052   KETONESUR NEGATIVE 11/23/2018 1052   PROTEINUR NEGATIVE 11/23/2018 1052   NITRITE NEGATIVE 11/23/2018 1052   LEUKOCYTESUR NEGATIVE 11/23/2018 1052   Sepsis Labs Invalid input(s): PROCALCITONIN,  WBC,  LACTICIDVEN Microbiology Recent Results (from the past 240 hour(s))  SARS Coronavirus 2 (CEPHEID - Performed in [place] hospital lab), Hosp Order     Status: None   Collection Time: 12/08/18  5:16 PM  Result Value Ref Range Status   SARS Coronavirus 2 NEGATIVE NEGATIVE Final    Comment: (NOTE) If result is NEGATIVE SARS-CoV-2 target nucleic acids are  NOT DETECTED. The SARS-CoV-2 RNA is generally detectable in upper and lower  respiratory specimens during the acute phase of infection. The lowest  concentration of SARS-CoV-2 viral copies this assay can detect is 250  copies / mL. A negative result does not preclude SARS-CoV-2 infection  and should not be used as the sole basis for treatment or other  patient management decisions.  A negative result may occur with  improper specimen collection / handling, submission of specimen other  than nasopharyngeal swab, presence of viral mutation(s) within the  areas targeted by this assay, and inadequate number of viral copies  (<250 copies / mL). A negative result must be combined with clinical  observations, patient history, and epidemiological information. If result is POSITIVE SARS-CoV-2 target nucleic acids are DETECTED. The SARS-CoV-2 RNA is generally detectable in upper and lower  respiratory specimens dur ing the  acute phase of infection.  Positive  results are indicative of active infection with SARS-CoV-2.  Clinical  correlation with patient history and other diagnostic information is  necessary to determine patient infection status.  Positive results do  not rule out bacterial infection or co-infection with other viruses. If result is PRESUMPTIVE POSTIVE SARS-CoV-2 nucleic acids MAY BE PRESENT.   A presumptive positive result was obtained on the submitted specimen  and confirmed on repeat testing.  While 2019 novel coronavirus  (SARS-CoV-2) nucleic acids may be present in the submitted sample  additional confirmatory testing may be necessary for epidemiological  and / or clinical management purposes  to differentiate between  SARS-CoV-2 and other Sarbecovirus currently known to infect humans.  If clinically indicated additional testing with an alternate test  methodology [phone]) is advised. The SARS-CoV-2 RNA is generally  detectable in upper and lower respiratory sp ecimens  during the acute  phase of infection. The expected result is Negative. Fact Sheet for Patients:  StrictlyIdeas.no Fact Sheet for Healthcare Providers: BankingDealers.co.za This test is not yet approved or cleared by the [place] FDA and has been authorized for detection and/or diagnosis of SARS-CoV-2 by FDA under an Emergency Use Authorization (EUA).  This EUA will remain in effect (meaning this test can be used) for the duration of the COVID-19 declaration under Section 564(b)(1) of the Act, 21 U.S.C. section 360bbb-3(b)(1), unless the authorization is terminated or revoked sooner. Performed at [facility], [place] [address] [place], [place] [address]   Surgical pcr screen     Status: None   Collection Time: 12/08/18  6:55 PM  Result Value Ref Range Status   MRSA, PCR NEGATIVE NEGATIVE Final   Staphylococcus aureus NEGATIVE NEGATIVE Final    Comment: (NOTE) The Xpert SA Assay (FDA approved for NASAL specimens in patients 39 years of age and older), is one component of a comprehensive surveillance program. It is not intended to diagnose infection nor to guide or monitor treatment. Performed at [facility], [place] [address], [place], [place] [address]   Aerobic/Anaerobic Culture (surgical/deep wound)     Status: None (Preliminary result)   Collection Time: 12/09/18 11:04 AM  Result Value Ref Range Status   Specimen Description   Final    ABSCESS RIGHT GROIN Performed at [facility], [place] [address], [place], [place] [address]    Special Requests   Final    PATIENT ON FOLLOWING ZYVOX, CLOFAZAMINE Performed at [facility], [place] [address] [place], [place] [address]    Gram Stain   Final    FEW WBC PRESENT,BOTH PMN AND MONONUCLEAR NO ORGANISMS SEEN    Culture   Final    CULTURE REINCUBATED FOR BETTER GROWTH Performed at [facility], [place] [address],  [place], [place] [address]    Report Status PENDING  Incomplete  Aerobic/Anaerobic Culture (surgical/deep wound)     Status: None (Preliminary result)   Collection Time: 12/09/18 11:47 AM  Result Value Ref Range Status   Specimen Description   Final    TISSUE RIGHT GROIN Performed at [facility], [place] [address], [place], [place] [address]    Special Requests   Final    PATIENT ON FOLLOWING ZYVOX, CLOFAZAMINE Performed at [facility], [place] [address], [place], [place] [address]    Gram Stain   Final    ABUNDANT WBC PRESENT,BOTH PMN AND MONONUCLEAR NO ORGANISMS SEEN    Culture  Final    CULTURE REINCUBATED FOR BETTER GROWTH Performed at [facility], [place] [address] [place], [place] [address]    Report Status PENDING  Incomplete     Time coordinating discharge: 25*minutes  SIGNED:   Antonieta Pert, MD  [facility] 12/10/2018, 11:27 AM  If 7PM-7AM, please contact night-coverage [email or web address]

## 2018-12-13 ENCOUNTER — Other Ambulatory Visit: Payer: Self-pay | Admitting: *Deleted

## 2018-12-13 LAB — ACID FAST SMEAR (AFB, MYCOBACTERIA): Acid Fast Smear: NEGATIVE

## 2018-12-13 NOTE — Telephone Encounter (Signed)
Patient has been approved for clofazimine from [employer]. Drug supply form was sent in this morning and they stated it would arrive to clinic in 7-10 days.  Will alert Dr. Tommy Medal when medication arrives.

## 2018-12-13 NOTE — Patient Outreach (Signed)
[facility]) Care Management  12/13/2018  Catherine [name] July 18, 1946 [id number]   EMMI-general discharge RED ON EMMI ALERT Day # 1 Date: Sunday 12/12/18 1046 am  Red Alert Reason: Scheduled follow up? No   Insurance: HTA plan 1 PPO  [facility] admissions x  1 ED visits x 1 in the last 6 months  D/c from [facility] 12/08/18 to 12/10/18   Outreach attempt # 1 successful at the home number  Patient is able to verify HIPAA [place] Management RN reviewed and addressed red alert with patient   EMMI:  Catherine [name] reports on Sunday 12/12/18 when the [facility] call came in she had not made a follow up appointment, therefore the answer was correct on 12/12/18. She confirms on today 12/13/18 she has received a call from her primary care provider's office to check on her, to do an e visit and she made a f/u appointment to see the surgeon, Dr Johney Maine on next Monday, Dec 20 2018.  She reports she is doing well today except still with minimal pain. She states she "is off the pain pills"  She reports Catherine [name] is caring for her wound since Friday 12/10/18. She confirms she has not heard from [facility] home care She would be agreeable to have at least one home health nurse visit to be shown how to or to confirm the wound is okay and is being dressed appropriately. She would be agreeable to assist with wound supplies prn    She gave [facility] RN CM permission for RN CM to call to speak with [facility] staff about the home visits. [facility] RN CM called and  spoke with Maudie Mercury and then [place] about the noted ordered home health RN services in [place] and on pt after summary sheet. CM notes the contact number is incorrect on the sheet. The correct number is [phone]. Colletta Maryland shared with RN CM that Catherine [name]'s coverage is out of network with [facility] and there would be a co pay of $50 for each home visit if Catherine [name] has not met her initial $5100 deductible.  [facility] noted that it appears that only $571 had been applied to her deductible  at this time, also pending the recent hospital bills from the last admission  [name] RN CM noted a list of in network home health providers on the Health team advantage online site. With review of the  inpatient RN CM notes 2 of the agencies were contacted prior to discharge   Attempted to collaborate with the hospital RN CM Message left for hospital RN CM Returned a call to Catherine [name] to update her - She agrees to have [facility] complete a home visit at $50 but preferring only 1-2 visits. She agreed to [facility] RN CM follow up within 4-7 business days  Returned call to [employer] to discussed that Catherine [name] would like 1-2 home visits form [facility] staff. Colletta Maryland states Catherine [name] is to be contacted  Returned call to [place], hospital RN CM to update her    Social: Catherine [name] lives at home with her husband Catherine [name] She is independent with her care needs. 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 01/29/2018, and incision and drainage on June 16, 2018 with excisional biopsy and closure on March 2020, COPD, DDD, Aorticaneurysm, anxiety/depression, GERD, hypertension, migraine, osteoporosis/osteopenia   DME:  Eyeglasses and wound supplies   Medications: She denies concerns with taking medications as prescribed, affording medications, side effects of medications and questions about medications    Appointments: next Monday, Dec 20 2018 to see Dr Johney Maine Surgeon  Dr Stephanie Acre called today 12/13/18 to complete an e visit  12/23/18 labs for ID MD Dr Tommy Medal  01/05/19 CT imaging GI MD Dr Cyndia Bent   Advance Directives: She has a HPOA and living will and does not wish to make changes     Consent: [facility] RN CM reviewed [facility] services with patient. Patient gave verbal consent for services [facility] telephonic RN CM.   Advised patient that there will be further automated EMMI- post discharge calls to assess how the patient is doing following the recent hospitalization Advised the patient that another call may be received from a nurse if any of their responses were abnormal. Patient voiced understanding and was appreciative of f/u call.   Plan: [facility] RN CM will follow up with Catherine Capelle within 4-7 business days to check on her and any other possible needs Cm will plan for case closure if no needs found   Pt encouraged to return a call to [place] CM prn  [facility] RN CM sent a successful outreach letter as discussed with [facility] brochure enclosed for review  Routed to MDs  Joelene Millin L. Lavina Hamman, RN, BSN, [place] Coordinator Office number [phone] Mobile number [phone]  Main [facility] number [phone] Fax number [phone]

## 2018-12-13 NOTE — Telephone Encounter (Signed)
Perfect

## 2018-12-14 LAB — AEROBIC/ANAEROBIC CULTURE W GRAM STAIN (SURGICAL/DEEP WOUND): Culture: NORMAL

## 2018-12-15 ENCOUNTER — Ambulatory Visit: Payer: PPO | Admitting: Infectious Disease

## 2018-12-15 DIAGNOSIS — Z9049 Acquired absence of other specified parts of digestive tract: Secondary | ICD-10-CM | POA: Diagnosis not present

## 2018-12-15 DIAGNOSIS — K219 Gastro-esophageal reflux disease without esophagitis: Secondary | ICD-10-CM | POA: Diagnosis not present

## 2018-12-15 DIAGNOSIS — L03314 Cellulitis of groin: Secondary | ICD-10-CM | POA: Diagnosis not present

## 2018-12-15 DIAGNOSIS — L02214 Cutaneous abscess of groin: Secondary | ICD-10-CM | POA: Diagnosis not present

## 2018-12-15 DIAGNOSIS — F418 Other specified anxiety disorders: Secondary | ICD-10-CM | POA: Diagnosis not present

## 2018-12-15 DIAGNOSIS — I714 Abdominal aortic aneurysm, without rupture: Secondary | ICD-10-CM | POA: Diagnosis not present

## 2018-12-15 DIAGNOSIS — M810 Age-related osteoporosis without current pathological fracture: Secondary | ICD-10-CM | POA: Diagnosis not present

## 2018-12-15 DIAGNOSIS — A318 Other mycobacterial infections: Secondary | ICD-10-CM | POA: Diagnosis not present

## 2018-12-15 DIAGNOSIS — I10 Essential (primary) hypertension: Secondary | ICD-10-CM | POA: Diagnosis not present

## 2018-12-15 DIAGNOSIS — M519 Unspecified thoracic, thoracolumbar and lumbosacral intervertebral disc disorder: Secondary | ICD-10-CM | POA: Diagnosis not present

## 2018-12-15 DIAGNOSIS — Z90710 Acquired absence of both cervix and uterus: Secondary | ICD-10-CM | POA: Diagnosis not present

## 2018-12-15 DIAGNOSIS — M8580 Other specified disorders of bone density and structure, unspecified site: Secondary | ICD-10-CM | POA: Diagnosis not present

## 2018-12-15 DIAGNOSIS — G43909 Migraine, unspecified, not intractable, without status migrainosus: Secondary | ICD-10-CM | POA: Diagnosis not present

## 2018-12-15 DIAGNOSIS — J449 Chronic obstructive pulmonary disease, unspecified: Secondary | ICD-10-CM | POA: Diagnosis not present

## 2018-12-15 DIAGNOSIS — T8142XD Infection following a procedure, deep incisional surgical site, subsequent encounter: Secondary | ICD-10-CM | POA: Diagnosis not present

## 2018-12-16 ENCOUNTER — Ambulatory Visit: Payer: Self-pay | Admitting: *Deleted

## 2018-12-16 DIAGNOSIS — I889 Nonspecific lymphadenitis, unspecified: Secondary | ICD-10-CM | POA: Diagnosis not present

## 2018-12-17 ENCOUNTER — Other Ambulatory Visit: Payer: Self-pay | Admitting: *Deleted

## 2018-12-17 ENCOUNTER — Other Ambulatory Visit: Payer: Self-pay

## 2018-12-17 NOTE — Patient Outreach (Signed)
[facility]) Care Management  12/17/2018  TAHLIA DEAMER 01/30/1946 [id number]   Care coordination and case closure   [facility] RN CM followed up with Mrs [place] to confirm she did received a call from [facility] on Tuesday, Dec 14 2018, a [facility] nurse visited on 12/15/18, Wednesday and on 12/17/18, Friday  She confirms the wound has been evaluated and confirms her husband and she are completing the wound dressing correctly. She reports her pain is getting better and she has not had to use pain medication as much    Plan [facility] RN CM will close case at this time as patient has been assessed and no needs identified/needs resolved.   Pt encouraged to return a call to [facility] RN CM prn   route note to MDs    Joelene Millin L. Lavina Hamman, RN, BSN, [place] Coordinator Office number [phone] Mobile number [phone]  Main [facility] number [phone] Fax number [phone]

## 2018-12-18 LAB — ACID FAST CULTURE WITH REFLEXED SENSITIVITIES (MYCOBACTERIA): Acid Fast Culture: NEGATIVE

## 2018-12-22 ENCOUNTER — Telehealth: Payer: Self-pay | Admitting: Pharmacist

## 2018-12-22 NOTE — Telephone Encounter (Signed)
Perfect thanks much

## 2018-12-22 NOTE — Telephone Encounter (Signed)
2 bottles of clofazimine have arrived to the clinic for patient.  She was given 2 bottles a few weeks ago that should last her roughly 3 months. Will keep these 2 bottles for her locked in the pharmacy office for when she needs then in July/August.

## 2018-12-23 ENCOUNTER — Other Ambulatory Visit: Payer: Self-pay

## 2018-12-23 ENCOUNTER — Other Ambulatory Visit: Payer: PPO

## 2018-12-23 DIAGNOSIS — A318 Other mycobacterial infections: Secondary | ICD-10-CM | POA: Diagnosis not present

## 2018-12-23 LAB — CBC WITH DIFFERENTIAL/PLATELET
Absolute Monocytes: 369 cells/uL (ref 200–950)
Basophils Absolute: 20 cells/uL (ref 0–200)
Basophils Relative: 0.3 %
Eosinophils Absolute: 47 cells/uL (ref 15–500)
Eosinophils Relative: 0.7 %
HCT: 36.1 % (ref 35.0–45.0)
Hemoglobin: 12.1 g/dL (ref 11.7–15.5)
Lymphs Abs: 1333 cells/uL (ref 850–3900)
MCH: 30 pg (ref 27.0–33.0)
MCHC: 33.5 g/dL (ref 32.0–36.0)
MCV: 89.4 fL (ref 80.0–100.0)
MPV: 10 fL (ref 7.5–12.5)
Monocytes Relative: 5.5 %
Neutro Abs: 4931 cells/uL (ref 1500–7800)
Neutrophils Relative %: 73.6 %
Platelets: 156 10*3/uL (ref 140–400)
RBC: 4.04 10*6/uL (ref 3.80–5.10)
RDW: 13.5 % (ref 11.0–15.0)
Total Lymphocyte: 19.9 %
WBC: 6.7 10*3/uL (ref 3.8–10.8)

## 2019-01-04 ENCOUNTER — Other Ambulatory Visit: Payer: Self-pay

## 2019-01-04 ENCOUNTER — Ambulatory Visit
Admission: RE | Admit: 2019-01-04 | Discharge: 2019-01-04 | Disposition: A | Discharge status: Home / Self Care | Payer: PPO | Source: Ambulatory Visit | Attending: Surgery | Admitting: Surgery

## 2019-01-04 DIAGNOSIS — I712 Thoracic aortic aneurysm, without rupture, unspecified: Secondary | ICD-10-CM

## 2019-01-04 DIAGNOSIS — J439 Emphysema, unspecified: Secondary | ICD-10-CM | POA: Diagnosis not present

## 2019-01-05 ENCOUNTER — Ambulatory Visit: Payer: PPO | Admitting: Infectious Disease

## 2019-01-05 ENCOUNTER — Ambulatory Visit: Payer: PPO | Admitting: Surgery

## 2019-01-05 ENCOUNTER — Encounter: Payer: Self-pay | Admitting: Surgery

## 2019-01-05 ENCOUNTER — Other Ambulatory Visit: Payer: PPO

## 2019-01-05 VITALS — BP 120/69 | HR 92 | Temp 97.5°F | Resp 16 | Ht 70.0 in | Wt 165.0 lb

## 2019-01-05 DIAGNOSIS — I712 Thoracic aortic aneurysm, without rupture, unspecified: Secondary | ICD-10-CM

## 2019-01-05 NOTE — Progress Notes (Signed)
HPI:  The patient is a 73 year old woman who returns for follow-up of a 4.0 cm fusiform ascending aortic aneurysm first noted on CT scan of the chest in March 2019.  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  She required further debridement and is currently having dressing changes done and is on antibiotics.  Current Outpatient Medications  Medication Sig Dispense Refill  . acetaminophen (TYLENOL) 500 MG tablet Take 1,000 mg by mouth 2 (two) times a day.     . albuterol (PROVENTIL HFA;VENTOLIN HFA) 108 (90 Base) MCG/ACT inhaler Inhale 2 puffs into the lungs every 4 (four) hours as needed for wheezing or shortness of breath.    . AMBULATORY NON FORMULARY MEDICATION Take 2 capsules by mouth daily. Medication Name: clofazimine 60 capsule 11  . budesonide-formoterol (SYMBICORT) 160-4.5 MCG/ACT inhaler Inhale 2 puffs into the lungs 2 (two) times daily.    . calcium elemental as carbonate (BARIATRIC TUMS ULTRA) 400 MG chewable tablet Chew 2 tablets by mouth daily.     . carboxymethylcellulose (REFRESH PLUS) 0.5 % SOLN Place 2 drops into both eyes 3 (three) times daily.     . Carboxymethylcellulose Sodium (REFRESH LIQUIGEL) 1 % GEL Place 1 drop into both eyes at bedtime.    . Cholecalciferol (VITAMIN D3) 5000 units CAPS Take 5,000 Units by mouth daily.     . Cyanocobalamin (VITAMIN B-12 PO) Take 2,500 mcg by mouth daily.     Marland Kitchen denosumab (PROLIA) 60 MG/ML SOSY injection Inject 60 mg into the skin every 6 (six) months.    . diltiazem (TAZTIA XT) 360 MG 24 hr capsule Take 360 mg by mouth daily.    Marland Kitchen docusate sodium (COLACE) 100 MG capsule Take 200 mg by mouth daily.     Marland Kitchen esomeprazole (NEXIUM) 20 MG capsule Take 20 mg by mouth daily.    . fluticasone (FLONASE) 50 MCG/ACT nasal spray Place 1 spray into both nostrils daily.     Marland Kitchen gabapentin (NEURONTIN) 100 MG capsule Take  100 mg by mouth at bedtime.     Marland Kitchen linezolid (ZYVOX) 600 MG tablet Take 1 tablet (600 mg total) by mouth 2 (two) times daily. 60 tablet 5  . LORazepam (ATIVAN) 1 MG tablet Take 0.5-1 mg by mouth See admin instructions. Take 0.5 mg by mouth in the morning and take 1 mg by mouth at bedtime    . montelukast (SINGULAIR) 10 MG tablet Take 10 mg by mouth at bedtime.     . naproxen sodium (ALEVE) 220 MG tablet Take 2 tablets (440 mg total) by mouth every morning.    . polyethylene glycol (MIRALAX / GLYCOLAX) packet Take 17 g by mouth daily.    Marland Kitchen tiotropium (SPIRIVA HANDIHALER) 18 MCG inhalation capsule Place 18 mcg into inhaler and inhale daily.      No current facility-administered medications for this visit.      Physical Exam: BP 120/69 (BP Location: Right Arm, Patient Position: Sitting, Cuff Size: Normal)   Pulse 92   Temp (!) 97.5 F (36.4 C) (Skin)   Resp 16   Ht 5\' 10"  (1.778 m)   Wt 165 lb (74.8 kg)   SpO2 98% Comment: RA  BMI 23.68 kg/m  She looks well. Cardiac exam shows a regular rate and rhythm with normal heart sounds.  There is no murmur. Lungs are clear.  Diagnostic Tests:  CLINICAL  DATA:  Follow-up TAA  EXAM: CT CHEST WITHOUT CONTRAST  TECHNIQUE: Multidetector CT imaging of the chest was performed following the standard protocol without IV contrast.  COMPARISON:  10/29/2017, MR lumbar spine, 11/14/2017  FINDINGS: Cardiovascular: The tubular ascending thoracic aorta measures 3.9 x 3.8 cm, not significantly changed compared to prior examination. The aortic valve, sinuses of Valsalva, and descending thoracic aorta are normal in caliber. Scattered calcific atherosclerosis. Normal heart size. No pericardial effusion.  Mediastinum/Nodes: No enlarged mediastinal, hilar, or axillary lymph nodes. Thyroid gland, trachea, and esophagus demonstrate no significant findings.  Lungs/Pleura: Mild centrilobular emphysema. Stable benign 3 mm fissural nodule of left  lower lobe (series 8, image 69). No pleural effusion or pneumothorax.  Upper Abdomen: No acute abnormality.  Musculoskeletal: No chest wall mass or suspicious bone lesions identified. Redemonstrated multiple bilateral thoracic perineural cysts, better characterized by prior MRI.  IMPRESSION: 1. The tubular ascending thoracic aorta measures 3.9 x 3.8 cm, not significantly changed compared to prior examination. The aortic valve, sinuses of Valsalva, and descending thoracic aorta are normal in caliber. Scattered calcific atherosclerosis.  2.  Emphysema.   Electronically Signed   By: Eddie Candle M.D.   On: 01/04/2019 09:59   Impression:  This 73 year old woman has a stable 3.9 cm fusiform ascending aortic aneurysm.  This is well below the surgical threshold of 5.5 cm.  Her blood pressure is under good control.  I reviewed the CT images with her and answered her questions.  I stressed the importance of continued good blood pressure control and preventing further enlargement and aortic dissection.  I think it is reasonable to follow this up again in 2 years with a CT scan of the chest.  Plan:  I will see her back in the office in 2 years with a CT scan of the chest without contrast.  I spent 15 minutes performing this established patient evaluation and > 50% of this time was spent face to face counseling and coordinating the care of this patient's aortic aneurysm.    Gaye Pollack, MD [facility] [phone]

## 2019-01-07 LAB — FUNGAL ORGANISM REFLEX

## 2019-01-07 LAB — FUNGUS CULTURE RESULT

## 2019-01-07 LAB — FUNGUS CULTURE WITH STAIN

## 2019-01-08 LAB — FUNGUS CULTURE WITH STAIN

## 2019-01-08 LAB — FUNGAL ORGANISM REFLEX

## 2019-01-08 LAB — FUNGUS CULTURE RESULT

## 2019-01-10 LAB — RAPID GROWER BROTH SUSCEP.
Clarithromycin: 8
Doxycycline: >16
Minocycline: 8
Tigecycline: 0.25

## 2019-01-10 LAB — AFB ORGANISM ID BY DNA PROBE
M avium complex: NEGATIVE
M gordonae: NEGATIVE
M kansasii: NEGATIVE
M tuberculosis complex: NEGATIVE

## 2019-01-10 LAB — ORG ID BY SEQUENCING RFLX AST

## 2019-01-10 LAB — ACID FAST CULTURE WITH REFLEXED SENSITIVITIES (MYCOBACTERIA): Acid Fast Culture: POSITIVE — AB

## 2019-01-11 ENCOUNTER — Telehealth: Payer: Self-pay

## 2019-01-11 NOTE — Telephone Encounter (Signed)
Patient called office today stating since starting Zyvox she has not been feeling well. Patient states she feels sick to the stomach and feels fatigued. Patient would like to know what Dr. Tommy Medal would recommend for her symptoms.  Patient would also like to know if Dr. Tommy Medal received the sensitivity report that Dr. Johney Maine did. Patient states that Zyvox was intermediate instead of susceptible, and would like to know if this antibiotic is the right choice for her.  Patient would also like to switch her E-visit on 6/15 to in person since she has multiple questions to discuss with Md. Kim

## 2019-01-12 NOTE — Telephone Encounter (Signed)
That is fine and is also reasonable to get safety labs.    Terms of drugs that are easy to use and that her at fully active the fluoroquinolones like ciprofloxacin and moxifloxacin are active and I would normally like to use them but they are contraindicated given her history of having an aortic aneurysm.  Cassie is there any data on using these drugs short term in patients such as her?  We could switch to IV tigecycline which may cause a lot more GI upset but appears to be active  IV amikacin is another possibility but she is hard of hearing already and I would hate to cause permanent hearing loss  Cassie any thoughts this maybe one to engage with Brigitte Pulse on

## 2019-01-12 NOTE — Telephone Encounter (Signed)
I can't find anything about short term use... every study I find just included patients that had some sort of FQ prescriptions prior to the study. I see you looped in Brigitte Pulse.. hopefully he can help [place]!

## 2019-01-13 NOTE — Telephone Encounter (Signed)
She go see Corene Cornea, he is going to push to have mesh removed from her hernia repair I believe. Not sure what abx regimen he would come up with

## 2019-01-17 ENCOUNTER — Encounter: Payer: Self-pay | Admitting: Infectious Disease

## 2019-01-17 ENCOUNTER — Other Ambulatory Visit: Payer: Self-pay

## 2019-01-17 ENCOUNTER — Ambulatory Visit (INDEPENDENT_AMBULATORY_CARE_PROVIDER_SITE_OTHER): Payer: PPO | Admitting: Infectious Disease

## 2019-01-17 VITALS — BP 108/67 | HR 99 | Temp 98.0°F

## 2019-01-17 DIAGNOSIS — A318 Other mycobacterial infections: Secondary | ICD-10-CM

## 2019-01-17 DIAGNOSIS — L02214 Cutaneous abscess of groin: Secondary | ICD-10-CM | POA: Diagnosis not present

## 2019-01-17 DIAGNOSIS — I889 Nonspecific lymphadenitis, unspecified: Secondary | ICD-10-CM

## 2019-01-17 DIAGNOSIS — M6748 Ganglion, other site: Secondary | ICD-10-CM | POA: Diagnosis not present

## 2019-01-17 DIAGNOSIS — R0600 Dyspnea, unspecified: Secondary | ICD-10-CM

## 2019-01-17 DIAGNOSIS — M67451 Ganglion, right hip: Secondary | ICD-10-CM

## 2019-01-17 DIAGNOSIS — L089 Local infection of the skin and subcutaneous tissue, unspecified: Secondary | ICD-10-CM | POA: Diagnosis not present

## 2019-01-17 DIAGNOSIS — R42 Dizziness and giddiness: Secondary | ICD-10-CM | POA: Diagnosis not present

## 2019-01-17 DIAGNOSIS — R0609 Other forms of dyspnea: Secondary | ICD-10-CM | POA: Diagnosis not present

## 2019-01-17 DIAGNOSIS — H903 Sensorineural hearing loss, bilateral: Secondary | ICD-10-CM

## 2019-01-17 DIAGNOSIS — L049 Acute lymphadenitis, unspecified: Secondary | ICD-10-CM

## 2019-01-17 HISTORY — DX: Dyspnea, unspecified: R06.00

## 2019-01-17 HISTORY — DX: Dizziness and giddiness: R42

## 2019-01-17 HISTORY — DX: Other forms of dyspnea: R06.09

## 2019-01-17 LAB — CBC WITH DIFFERENTIAL/PLATELET
Abs Immature Granulocytes: 0.02 10*3/uL (ref 0.00–0.07)
Basophils Absolute: 0 10*3/uL (ref 0.0–0.1)
Basophils Relative: 0 %
Eosinophils Absolute: 0 10*3/uL (ref 0.0–0.5)
Eosinophils Relative: 1 %
HCT: 29.6 % — ABNORMAL LOW (ref 36.0–46.0)
Hemoglobin: 9.6 g/dL — ABNORMAL LOW (ref 12.0–15.0)
Immature Granulocytes: 0 %
Lymphocytes Relative: 21 %
Lymphs Abs: 1.3 10*3/uL (ref 0.7–4.0)
MCH: 28.7 pg (ref 26.0–34.0)
MCHC: 32.4 g/dL (ref 30.0–36.0)
MCV: 88.4 fL (ref 80.0–100.0)
Monocytes Absolute: 0.5 10*3/uL (ref 0.1–1.0)
Monocytes Relative: 8 %
Neutro Abs: 4.4 10*3/uL (ref 1.7–7.7)
Neutrophils Relative %: 70 %
Platelets: 169 10*3/uL (ref 150–400)
RBC: 3.35 MIL/uL — ABNORMAL LOW (ref 3.87–5.11)
RDW: 13.4 % (ref 11.5–15.5)
WBC: 6.3 10*3/uL (ref 4.0–10.5)
nRBC: 0.3 % — ABNORMAL HIGH (ref 0.0–0.2)

## 2019-01-17 LAB — COMPREHENSIVE METABOLIC PANEL
ALT: 16 U/L (ref 0–44)
AST: 13 U/L — ABNORMAL LOW (ref 15–41)
Albumin: 3.9 g/dL (ref 3.5–5.0)
Alkaline Phosphatase: 42 U/L (ref 38–126)
Anion gap: 9 (ref 5–15)
BUN: 19 mg/dL (ref 8–23)
CO2: 21 mmol/L — ABNORMAL LOW (ref 22–32)
Calcium: 8.9 mg/dL (ref 8.9–10.3)
Chloride: 105 mmol/L (ref 98–111)
Creatinine, Ser: 1.13 mg/dL — ABNORMAL HIGH (ref 0.44–1.00)
GFR calc Af Amer: 56 mL/min — ABNORMAL LOW (ref >60)
GFR calc non Af Amer: 48 mL/min — ABNORMAL LOW (ref >60)
Glucose, Bld: 98 mg/dL (ref 70–99)
Potassium: 4.1 mmol/L (ref 3.5–5.1)
Sodium: 135 mmol/L (ref 135–145)
Total Bilirubin: 0.4 mg/dL (ref 0.3–1.2)
Total Protein: 5.9 g/dL — ABNORMAL LOW (ref 6.5–8.1)

## 2019-01-17 NOTE — Progress Notes (Signed)
Complaint: Worsening shortness of breath with exertion and dizziness   Subjective:    Patient ID: Catherine [name], female    DOB: August 02, 1946, 73 y.o.   MRN: [id number]  HPI  Catherine [name] is a quite pleasant 73 year old Caucasian female with a past medical history examined for thoracic aneurysm, anxiety, social cystitis who in 2000 1617 developed a cystic lesion in her right groin.  She had this excised by general surgery on June 17 and pathology came back consistent with a lymphangioma and benign lymph nodes.  She then later underwent excision and repair of a pelvic mass which turned out on pathology be consistent with a synovial cyst, and also underwent bilateral femoral hernia repair and left inguinal hernia repair in June 2019.  In November 2019 she developed a proximal thigh mass which was excised and also found to be consistent with a synovial cyst and benign lymph nodes.  She had worsening swelling in her right groin and underwent incision and drainage of what appeared to be a seroma on September 20, 2018.  Postoperatively in the ensuing week she had worsening erythema and swelling and drainage.  She also developed overt fevers and malaise and was started on doxycycline with resolution of her fevers and malaise.  However just prior to surgery she was having worsening erythema in this area and started on doxycycline without any improvement in her symptoms.  She underwent right groin and inner thigh exploration with excision of subcutaneous lymph nodes on November 02, 2018 in the OR there was some yellow cheesy material found consistent with necrosis.  Pathology report showed necrotizing granulomatous inflammation in the soft tissue as well as the 3 lymph nodes that were excised.  Stains were negative but cultures have subsequently grown nontuberculous mycobacteria and ensuing few weeks.  This nontuberculous mycobacteria was probe not just for TB but M gordonae I and M avium and kansasii I and none  of these organisms were present.  Saw her in clinic we do not yet have the identity of the organism.  We held off on initiating antimicrobial therapy.  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  I had an electronic visit with her and we decided to do initiate doxycycline and Bactrim despite her history of sulfa allergies given that these 2 drugs had reasonable probability of being active against her organism.  She had in the interim developed worsening blistering and erythema in her groin.  We also had had a CT scan done which had shown:  Fluid collection in the right inguinal region, extending medially into the upper right perineal soft tissues concerning for postoperative abscess, 6.4 x 1.4 cm. Areas of enhancement also noted in the adjacent abductor muscles which on the coronal imaging appear to represent small intramuscular abscesses.  Time unfortunately she has done worse with worsening drainage which she shows me today.  She has worsening pain in her groin and induration of this area as well as the labia which is somewhat distorted.  Microbiologic sensitivity of come back largely on this Mycobacterium fortuitum.    Choices of antimicrobial therapy are limited as described below.  I felt strongly that she needed surgery with an I&D and she was admitted to [facility] long and underwent formal incision and debridement by Dr. Johney Maine.  Cultures yielded MRSA as well as Mycobacterium fortuitum now showing intermediate susceptibility to Zyvox.  She also endorses having worsening dyspnea and some chest discomfort since being on Zyvox. 73  She has some dizziness with standing.  I was concerned that she might have bone marrow toxicity from Zyvox and anemia that might explain her symptoms.  She was indeed orthostatic on checking her blood pressure from lying to standing.  However stat labs that were done and came back about 2 hours after her visit show her to be anemic but not sufficiently  anemic to explain her symptoms.  I have asked her to stop her Zyvox and clofazimine.  I am skeptical however that these antibiotics are causing her symptoms and have advised her to have a very low threshold to go to the emergency department to have a CT angiogram performed to rule out pulmonary embolism.  She is assured me that she will do so tomorrow if she is not feeling better or feeling worse tonight.  I also CC her primary care physician     Past Medical History:  Diagnosis Date  . Anxiety   . Aortic aneurysm ([place]) 12/08/2018  . Arthritis    HANDS  . Chronic constipation   . Complication of anesthesia    upon waking up from anesthesia patient experienced chest pain - EKG done was negative, patient states "it may have been indigestion"  . COPD (chronic obstructive pulmonary disease) ([place])   . DDD (degenerative disc disease), lumbar    L2-3/3-4/4-5  . Dysuria   . Frequency of urination   . Ganglion cyst of right groin 11/23/2018  . GERD (gastroesophageal reflux disease)   . Hypertension   . Migraine   . 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 Osteopenia 2019   TO OSTEOPOROSIS   . Pelvic pain in female   . Smokers' cough ([place])   . Thoracic aortic aneurysm (TAA) ([place])   . Urgency of urination    h/o INTERSTIAL CYSTITIS...DR. HUMPHRIES/GRAPEY  . Wears glasses     Past Surgical History:  Procedure Laterality Date  . ABDOMINAL HYSTERECTOMY  1982   W/  UNILATERAL SALPINGOOPHORECTOMY  . bilateral hernia surgery      removed ganglion cyst and swollen lymph node   . CHOLECYSTECTOMY OPEN  1987   W/  APPENDECTOMY  . COLONOSCOPY  2002  &  12-2012 & 03/19/15  . CYSTO WITH HYDRODISTENSION N/A 02/20/2014   Procedure: [place] AND MARCAINE INSTILLATION;  Surgeon: Bernestine Amass, MD;  Location: [facility];  Service: Urology;  Laterality: N/A;  . EXCISION MASS LOWER EXTREMETIES Right 11/02/2018   Procedure: REMOVAL OF RIGHT GROIN  AND THIGH SUBCUTANEOUS MASSES.;  Surgeon: Michael Boston, MD;  Location: [place];  Service: General;  Laterality: Right;  . EYE SURGERY     bilateral cataract surgery   . INCISION AND DRAINAGE ABSCESS N/A 12/09/2018   Procedure: INCISION AND DRAINAGE RIGHT GROIN and thigh  WITH DEBRIDEMENT;  Surgeon: Michael Boston, MD;  Location: [facility];  Service: General;  Laterality: N/A;  . LAPAROSCOPIC UNILATERAL SALPINGOOPHORECTOMY  1984  . MASS EXCISION Right 06/16/2018   Procedure: REMOVAL OF RIGHT GROIN SUBCUTANEOUS MASS ERAS PATHWAY;  Surgeon: Michael Boston, MD;  Location: [facility];  Service: General;  Laterality: Right;  . removal of lymph node cyst in right groin     . ROTATOR CUFF REPAIR Right 2003   and BONE SPUR    Family History  Problem Relation Age of Onset  . Esophageal cancer Mother 75  . Lung cancer Father 70      Social History   Socioeconomic History  . Marital status: Married    Spouse name: Not on file  .  Number of children: Not on file  . Years of education: Not on file  . Highest education level: Not on file  Occupational History  . Not on file  Social Needs  . Financial resource strain: Not on file  . Food insecurity    Worry: Not on file    Inability: Not on file  . Transportation needs    Medical: Not on file    Non-medical: Not on file  Tobacco Use  . Smoking status: Former Smoker    Packs/day: 0.75    Years: 48.00    Pack years: 36.00    Types: Cigarettes    Quit date: 09/04/2017    Years since quitting: 1.3  . Smokeless tobacco: Never Used  Substance and Sexual Activity  . Alcohol use: No  . Drug use: No  . Sexual activity: Not on file  Lifestyle  . Physical activity    Days per week: Not on file    Minutes per session: Not on file  . Stress: Not on file  Relationships  . Social Herbalist on phone: Not on file    Gets together: Not on file    Attends religious service: Not on file    Active member of club or organization: Not on file     Attends meetings of clubs or organizations: Not on file    Relationship status: Not on file  Other Topics Concern  . Not on file  Social History Narrative  . Not on file    Allergies  Allergen Reactions  . Ciprofloxacin Hcl Other (See Comments)    Aortic aneurysm per MD  . Metoprolol Other (See Comments) and Cough    and wheezing  . Penicillins Shortness Of Breath, Rash and Other (See Comments)    Has patient had a PCN reaction causing immediate rash, facial/tongue/throat swelling, SOB or lightheadedness with hypotension: Yes Has patient had a PCN reaction causing severe rash involving mucus membranes or skin necrosis: No Has patient had a PCN reaction that required hospitalization: Yes - MD office Has patient had a PCN reaction occurring within the last 10 years: No If all of the above answers are "NO", then may proceed with Cephalosporin use.   Marland Kitchen Oxycodone Other (See Comments)    Nightmares.  Tolerates tramadol  . Advair Diskus [Fluticasone-Salmeterol] Anxiety  . Sulfa Antibiotics Rash     Current Outpatient Medications:  .  acetaminophen (TYLENOL) 500 MG tablet, Take 1,000 mg by mouth 2 (two) times a day. , Disp: , Rfl:  .  albuterol (PROVENTIL HFA;VENTOLIN HFA) 108 (90 Base) MCG/ACT inhaler, Inhale 2 puffs into the lungs every 4 (four) hours as needed for wheezing or shortness of breath., Disp: , Rfl:  .  AMBULATORY NON FORMULARY MEDICATION, Take 2 capsules by mouth daily. Medication Name: clofazimine, Disp: 60 capsule, Rfl: 11 .  budesonide-formoterol (SYMBICORT) 160-4.5 MCG/ACT inhaler, Inhale 2 puffs into the lungs 2 (two) times daily., Disp: , Rfl:  .  calcium elemental as carbonate (BARIATRIC TUMS ULTRA) 400 MG chewable tablet, Chew 2 tablets by mouth daily. , Disp: , Rfl:  .  carboxymethylcellulose (REFRESH PLUS) 0.5 % SOLN, Place 2 drops into both eyes 3 (three) times daily. , Disp: , Rfl:  .  Carboxymethylcellulose Sodium (REFRESH LIQUIGEL) 1 % GEL, Place 1 drop into  both eyes at bedtime., Disp: , Rfl:  .  Cholecalciferol (VITAMIN D3) 5000 units CAPS, Take 5,000 Units by mouth daily. , Disp: , Rfl:  .  Cyanocobalamin (VITAMIN B-12 PO), Take 2,500 mcg by mouth daily. , Disp: , Rfl:  .  denosumab (PROLIA) 60 MG/ML SOSY injection, Inject 60 mg into the skin every 6 (six) months., Disp: , Rfl:  .  diltiazem (TAZTIA XT) 360 MG 24 hr capsule, Take 360 mg by mouth daily., Disp: , Rfl:  .  docusate sodium (COLACE) 100 MG capsule, Take 200 mg by mouth daily. , Disp: , Rfl:  .  esomeprazole (NEXIUM) 20 MG capsule, Take 20 mg by mouth daily., Disp: , Rfl:  .  fluticasone (FLONASE) 50 MCG/ACT nasal spray, Place 1 spray into both nostrils daily. , Disp: , Rfl:  .  gabapentin (NEURONTIN) 100 MG capsule, Take 100 mg by mouth at bedtime. , Disp: , Rfl:  .  linezolid (ZYVOX) 600 MG tablet, Take 1 tablet (600 mg total) by mouth 2 (two) times daily., Disp: 60 tablet, Rfl: 5 .  LORazepam (ATIVAN) 1 MG tablet, Take 0.5-1 mg by mouth See admin instructions. Take 0.5 mg by mouth in the morning and take 1 mg by mouth at bedtime, Disp: , Rfl:  .  montelukast (SINGULAIR) 10 MG tablet, Take 10 mg by mouth at bedtime. , Disp: , Rfl:  .  naproxen sodium (ALEVE) 220 MG tablet, Take 2 tablets (440 mg total) by mouth every morning., Disp: , Rfl:  .  polyethylene glycol (MIRALAX / GLYCOLAX) packet, Take 17 g by mouth daily., Disp: , Rfl:  .  tiotropium (SPIRIVA HANDIHALER) 18 MCG inhalation capsule, Place 18 mcg into inhaler and inhale daily. , Disp: , Rfl:     Review of Systems  Constitutional: Negative for chills and fever.  HENT: Negative for congestion and sore throat.   Eyes: Negative for photophobia.  Respiratory: Positive for chest tightness and shortness of breath. Negative for cough and wheezing.   Cardiovascular: Negative for chest pain, palpitations and leg swelling.  Gastrointestinal: Negative for abdominal distention, abdominal pain, blood in stool, constipation, diarrhea,  nausea and vomiting.  Genitourinary: Negative for dysuria, flank pain and hematuria.  Musculoskeletal: Negative for back pain and myalgias.  Skin: Positive for color change and wound. Negative for rash.  Neurological: Positive for dizziness. Negative for weakness and headaches.  Hematological: Positive for adenopathy. Does not bruise/bleed easily.  Psychiatric/Behavioral: Negative for agitation, behavioral problems, confusion, decreased concentration and suicidal ideas. The patient is nervous/anxious.        Objective:   Physical Exam Vitals signs and nursing note reviewed. Exam conducted with a chaperone present.  Constitutional:      General: She is not in acute distress.    Appearance: She is not diaphoretic.  HENT:     Head: Normocephalic and atraumatic.     Right Ear: External ear normal.     Left Ear: External ear normal.     Nose: Nose normal.     Mouth/Throat:     Pharynx: No oropharyngeal exudate.  Eyes:     General: No scleral icterus.    Conjunctiva/sclera: Conjunctivae normal.  Neck:     Musculoskeletal: Normal range of motion and neck supple.  Cardiovascular:     Rate and Rhythm: Regular rhythm. Tachycardia present.     Heart sounds: Normal heart sounds. No murmur. No friction rub. No gallop.   Pulmonary:     Effort: Pulmonary effort is normal. No respiratory distress.     Breath sounds: Normal breath sounds. No stridor. No wheezing, rhonchi or rales.  Abdominal:     General: Bowel sounds are  normal.     Palpations: Abdomen is soft.     Tenderness: There is no rebound.  Genitourinary:    Exam position: Supine.     Labia:        Right: Tenderness present.   Musculoskeletal: Normal range of motion.        General: No tenderness.  Lymphadenopathy:     Cervical: No cervical adenopathy.     Lower Body: Right inguinal adenopathy present.  Skin:    General: Skin is warm and dry.     Coloration: Skin is not jaundiced or pale.     Findings: No erythema or rash.   Neurological:     General: No focal deficit present.     Mental Status: She is alert and oriented to person, place, and time.     Coordination: Coordination normal.  Psychiatric:        Mood and Affect: Mood normal.        Behavior: Behavior normal.        Thought Content: Thought content normal.        Judgment: Judgment normal.   She has significant swelling and induration in her groin  Now with open sores from where she has blisters  Photograph from  November 23, 2018:     12/08/2018:     01/17/19:         Assessment & Plan:   Worsening dyspnea on exertion over the last 2 weeks with dizziness:  Initially I thought this could have been done due to anemia in the setting of Zyvox given the patient's association with taking the Zyvox and having the symptoms.  Her hemoglobin at 9 is a drop from 12 but not low enough to explain her symptoms.  I am also skeptical that Zyvox could be directly causing the symptoms.  Obviously the next thing to worry about is a worse case scenario would be that she had a pulmonary embolism that she is developed in the last few weeks.  I have asked to have a very low threshold to go to the emergency department and certainly if she feels worse tonight to go to the emergency department tonight if she is not feeling better tomorrow to go tomorrow.  I will CC her primary care doc as well.  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   I discussed this case with Dr. Brigitte Pulse at [facility].  He has concerned that the mesh underlying all of this could be infected and that that area may need to have surgery.  He is agreed to formally see her at [facility] and he should have received records  from [place].  I will reach out to him again today via email  In the interim if we need did to pick a 2 drug regimen I would probably pick clofazimine along with IV tigecycline though I was defer to Dr. Romie Levee expertise here.   I do not know if fluoroquinolones have been tried in situations like this and the risk of aneurysm rupture avoided?    Aortic aneurysm: avoid FQ until we have had further information or second opinion.  I spent greater than 25 minutes with the patient including greater than 50% of time in face to face counsel of  the patient the nature of her mycobacterial infection that we need to always treated with 2 drugs for her to hold both drugs for now while we awaited her laboratory data, regarding the nature of orthostatic hypotension and need to drop her blood pressure medicine, her labs and in coordination of her care.

## 2019-01-18 ENCOUNTER — Emergency Department (HOSPITAL_COMMUNITY)
Admission: EM | Admit: 2019-01-18 | Discharge: 2019-01-18 | Disposition: A | Discharge status: Home / Self Care | Payer: PPO | Attending: Emergency Medicine | Admitting: Emergency Medicine

## 2019-01-18 ENCOUNTER — Emergency Department (HOSPITAL_COMMUNITY): Payer: PPO

## 2019-01-18 ENCOUNTER — Encounter (HOSPITAL_COMMUNITY): Payer: Self-pay | Admitting: Emergency Medicine

## 2019-01-18 ENCOUNTER — Other Ambulatory Visit: Payer: Self-pay

## 2019-01-18 DIAGNOSIS — Z79899 Other long term (current) drug therapy: Secondary | ICD-10-CM | POA: Diagnosis not present

## 2019-01-18 DIAGNOSIS — Z87891 Personal history of nicotine dependence: Secondary | ICD-10-CM | POA: Diagnosis not present

## 2019-01-18 DIAGNOSIS — J449 Chronic obstructive pulmonary disease, unspecified: Secondary | ICD-10-CM | POA: Insufficient documentation

## 2019-01-18 DIAGNOSIS — I10 Essential (primary) hypertension: Secondary | ICD-10-CM | POA: Diagnosis not present

## 2019-01-18 DIAGNOSIS — R0602 Shortness of breath: Secondary | ICD-10-CM | POA: Diagnosis not present

## 2019-01-18 DIAGNOSIS — J439 Emphysema, unspecified: Secondary | ICD-10-CM | POA: Diagnosis not present

## 2019-01-18 LAB — BASIC METABOLIC PANEL
Anion gap: 14 (ref 5–15)
BUN: 20 mg/dL (ref 8–23)
CO2: 18 mmol/L — ABNORMAL LOW (ref 22–32)
Calcium: 9 mg/dL (ref 8.9–10.3)
Chloride: 107 mmol/L (ref 98–111)
Creatinine, Ser: 1.02 mg/dL — ABNORMAL HIGH (ref 0.44–1.00)
GFR calc Af Amer: >60 mL/min (ref >60)
GFR calc non Af Amer: 55 mL/min — ABNORMAL LOW (ref >60)
Glucose, Bld: 97 mg/dL (ref 70–99)
Potassium: 3.9 mmol/L (ref 3.5–5.1)
Sodium: 139 mmol/L (ref 135–145)

## 2019-01-18 LAB — CBC WITH DIFFERENTIAL/PLATELET
Abs Immature Granulocytes: 0.02 10*3/uL (ref 0.00–0.07)
Basophils Absolute: 0 10*3/uL (ref 0.0–0.1)
Basophils Relative: 0 %
Eosinophils Absolute: 0 10*3/uL (ref 0.0–0.5)
Eosinophils Relative: 1 %
HCT: 30.9 % — ABNORMAL LOW (ref 36.0–46.0)
Hemoglobin: 10 g/dL — ABNORMAL LOW (ref 12.0–15.0)
Immature Granulocytes: 0 %
Lymphocytes Relative: 16 %
Lymphs Abs: 1 10*3/uL (ref 0.7–4.0)
MCH: 29.6 pg (ref 26.0–34.0)
MCHC: 32.4 g/dL (ref 30.0–36.0)
MCV: 91.4 fL (ref 80.0–100.0)
Monocytes Absolute: 0.6 10*3/uL (ref 0.1–1.0)
Monocytes Relative: 9 %
Neutro Abs: 4.9 10*3/uL (ref 1.7–7.7)
Neutrophils Relative %: 74 %
Platelets: 160 10*3/uL (ref 150–400)
RBC: 3.38 MIL/uL — ABNORMAL LOW (ref 3.87–5.11)
RDW: 13.5 % (ref 11.5–15.5)
WBC: 6.6 10*3/uL (ref 4.0–10.5)
nRBC: 0 % (ref 0.0–0.2)

## 2019-01-18 LAB — C-REACTIVE PROTEIN: CRP: 0.2 mg/L (ref <8.0)

## 2019-01-18 LAB — TROPONIN I: Troponin I: <0.03 ng/mL (ref <0.03)

## 2019-01-18 LAB — BRAIN NATRIURETIC PEPTIDE: B Natriuretic Peptide: 16.9 pg/mL (ref 0.0–100.0)

## 2019-01-18 LAB — SEDIMENTATION RATE: Sed Rate: 2 mm/h (ref 0–30)

## 2019-01-18 MED ORDER — SODIUM CHLORIDE (PF) 0.9 % IJ SOLN
INTRAMUSCULAR | Status: AC
  Filled 2019-01-18: qty 50

## 2019-01-18 MED ORDER — IOHEXOL 350 MG/ML SOLN
100.0000 mL | Freq: Once | INTRAVENOUS | Status: AC | PRN
  Administered 2019-01-18: 100 mL via INTRAVENOUS

## 2019-01-18 NOTE — ED Triage Notes (Signed)
Pt reports had surgery on right groin abscesses 6 weeks ago. Reports had intermittent SOB with exertion since. Is being treated for infection but was advised to come to ED to rule out PE. Pt denies taking blood thinners.  PCP advised to not take her HTN meds today as her BP would drop when go from lying to sitting.

## 2019-01-18 NOTE — ED Provider Notes (Signed)
[facility] DEPT Provider Note   CSN: [id number] Arrival date & time: 01/18/19  1040    History   Chief Complaint Chief Complaint  Patient presents with  . Shortness of Breath    with exertion    HPI Catherine [name] is a 73 y.o. female.     The history is provided by the patient.  Shortness of Breath Severity:  Mild Onset quality:  Gradual Timing:  Intermittent Progression:  Waxing and waning Chronicity:  New Context: activity   Relieved by:  Rest Worsened by:  Exertion Associated symptoms: no abdominal pain, no chest pain, no cough, no ear pain, no fever, no rash, no sore throat, no vomiting and no wheezing     Past Medical History:  Diagnosis Date  . Anxiety   . Aortic aneurysm ([place]) 12/08/2018  . Arthritis    HANDS  . Chronic constipation   . Complication of anesthesia    upon waking up from anesthesia patient experienced chest pain - EKG done was negative, patient states "it may have been indigestion"  . COPD (chronic obstructive pulmonary disease) ([place])   . DDD (degenerative disc disease), lumbar    L2-3/3-4/4-5  . Dizziness 01/17/2019  . DOE (dyspnea on exertion) 01/17/2019  . Dysuria   . Frequency of urination   . Ganglion cyst of right groin 11/23/2018  . GERD (gastroesophageal reflux disease)   . Hypertension   . Migraine   . 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 Osteopenia 2019   TO OSTEOPOROSIS   . Pelvic pain in female   . Smokers' cough ([place])   . Thoracic aortic aneurysm (TAA) ([place])   . Urgency of urination    h/o INTERSTIAL CYSTITIS...DR. HUMPHRIES/GRAPEY  . Wears glasses     Patient Active Problem List   Diagnosis Date Noted  . Dizziness 01/17/2019  . DOE (dyspnea on exertion) 01/17/2019  . Mycobacterium fortuitum infection   . Thigh abscess   . Aortic aneurysm ([place]) 12/08/2018  . Soft tissue infection - Mycobaterium fortuitum   12/08/2018  . Cellulitis of right groin 12/08/2018  . Ganglion cyst of  right groin 11/23/2018  . 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 Groin abscess 06/16/2018  . Anxiety   . Urgency of urination   . DDD (degenerative disc disease), lumbar   . Osteopenia 08/04/2017  . Right sided sciatica 02/18/2017  . Piriformis muscle pain 02/18/2017  . Right groin pain 11/04/2016  . Neuropathy 11/04/2016  . Numbness 11/04/2016  . Depression with anxiety 11/04/2016  . Dysfunction of right eustachian tube 10/30/2015  . Presbycusis of both ears 10/30/2015  . Sensory hearing loss, bilateral 10/30/2015  . Subjective tinnitus of both ears 10/30/2015  . Atypical chest pain 05/08/2014    Past Surgical History:  Procedure Laterality Date  . ABDOMINAL HYSTERECTOMY  1982   W/  UNILATERAL SALPINGOOPHORECTOMY  . bilateral hernia surgery      removed ganglion cyst and swollen lymph node   . CHOLECYSTECTOMY OPEN  1987   W/  APPENDECTOMY  . COLONOSCOPY  2002  &  12-2012 & 03/19/15  . CYSTO WITH HYDRODISTENSION N/A 02/20/2014   Procedure: [place] AND MARCAINE INSTILLATION;  Surgeon: Bernestine Amass, MD;  Location: [facility];  Service: Urology;  Laterality: N/A;  . EXCISION MASS LOWER EXTREMETIES Right 11/02/2018   Procedure: REMOVAL OF RIGHT GROIN AND THIGH SUBCUTANEOUS MASSES.;  Surgeon: Michael Boston, MD;  Location: [facility];  Service: General;  Laterality: Right;  .  EYE SURGERY     bilateral cataract surgery   . INCISION AND DRAINAGE ABSCESS N/A 12/09/2018   Procedure: INCISION AND DRAINAGE RIGHT GROIN and thigh  WITH DEBRIDEMENT;  Surgeon: Michael Boston, MD;  Location: [facility];  Service: General;  Laterality: N/A;  . LAPAROSCOPIC UNILATERAL SALPINGOOPHORECTOMY  1984  . MASS EXCISION Right 06/16/2018   Procedure: REMOVAL OF RIGHT GROIN SUBCUTANEOUS MASS ERAS PATHWAY;  Surgeon: Michael Boston, MD;  Location: [facility];  Service: General;  Laterality: Right;  . removal of lymph node cyst in right groin     . ROTATOR CUFF REPAIR  Right 2003   and BONE SPUR     OB History   No obstetric history on file.      Home Medications    Prior to Admission medications   Medication Sig Start Date End Date Taking? Authorizing Provider  acetaminophen (TYLENOL) 500 MG tablet Take 1,000 mg by mouth at bedtime.    Yes [provider]  albuterol (PROVENTIL HFA;VENTOLIN HFA) 108 (90 Base) MCG/ACT inhaler Inhale 2 puffs into the lungs every 4 (four) hours as needed for wheezing or shortness of breath.   Yes [provider]  budesonide-formoterol (SYMBICORT) 160-4.5 MCG/ACT inhaler Inhale 2 puffs into the lungs 2 (two) times daily.   Yes [provider]  calcium elemental as carbonate (BARIATRIC TUMS ULTRA) 400 MG chewable tablet Chew 2 tablets by mouth daily.    Yes [provider]  carboxymethylcellulose (REFRESH PLUS) 0.5 % SOLN Place 2 drops into both eyes 3 (three) times daily.    Yes [provider]  Carboxymethylcellulose Sodium (REFRESH LIQUIGEL) 1 % GEL Place 1 drop into both eyes at bedtime.   Yes [provider]  cetirizine (ZYRTEC) 10 MG tablet Take 10 mg by mouth daily.   Yes [provider]  Cholecalciferol (VITAMIN D3) 5000 units CAPS Take 5,000 Units by mouth daily.    Yes [provider]  Cyanocobalamin (VITAMIN B-12 PO) Take 2,500 mcg by mouth daily.    Yes [provider]  denosumab (PROLIA) 60 MG/ML SOSY injection Inject 60 mg into the skin every 6 (six) months.   Yes [provider]  diltiazem (TAZTIA XT) 360 MG 24 hr capsule Take 360 mg by mouth daily.   Yes [provider]  docusate sodium (COLACE) 100 MG capsule Take 200 mg by mouth daily.    Yes [provider]  esomeprazole (NEXIUM) 20 MG capsule Take 20 mg by mouth daily.   Yes [provider]  fluticasone (FLONASE) 50 MCG/ACT nasal spray Place 1 spray into both nostrils daily.    Yes [provider]  gabapentin (NEURONTIN) 100 MG  capsule Take 100 mg by mouth at bedtime.    Yes [provider]  LORazepam (ATIVAN) 1 MG tablet Take 0.5-1 mg by mouth See admin instructions. Take 0.5 mg by mouth in the morning and take 1 mg by mouth at bedtime   Yes [provider]  montelukast (SINGULAIR) 10 MG tablet Take 10 mg by mouth at bedtime.    Yes [provider]  polyethylene glycol (MIRALAX / GLYCOLAX) packet Take 17 g by mouth daily.   Yes [provider]  tiotropium (SPIRIVA HANDIHALER) 18 MCG inhalation capsule Place 18 mcg into inhaler and inhale daily.    Yes [provider]  AMBULATORY NON FORMULARY MEDICATION Take 2 capsules by mouth daily. Medication Name: clofazimine Patient not taking: Reported on 01/18/2019 12/08/18   Tommy Medal, [place]  N, MD  linezolid (ZYVOX) 600 MG tablet Take 1 tablet (600 mg total) by mouth 2 (two) times daily. Patient not taking: Reported on 01/18/2019 12/08/18   Tommy Medal, Lavell Islam, MD  naproxen sodium (ALEVE) 220 MG tablet Take 2 tablets (440 mg total) by mouth every morning. Patient not taking: Reported on 01/18/2019 12/10/18   Antonieta Pert, MD    Family History Family History  Problem Relation Age of Onset  . Esophageal cancer Mother 81  . Lung cancer Father 34    Social History Social History   Tobacco Use  . Smoking status: Former Smoker    Packs/day: 0.75    Years: 48.00    Pack years: 36.00    Types: Cigarettes    Quit date: 09/04/2017    Years since quitting: 1.3  . Smokeless tobacco: Never Used  Substance Use Topics  . Alcohol use: No  . Drug use: No     Allergies   Ciprofloxacin hcl, Metoprolol, Penicillins, Oxycodone, Advair diskus [fluticasone-salmeterol], and Sulfa antibiotics   Review of Systems Review of Systems  Constitutional: Negative for chills and fever.  HENT: Negative for ear pain and sore throat.   Eyes: Negative for pain and visual disturbance.  Respiratory: Positive for shortness of breath. Negative for cough and  wheezing.   Cardiovascular: Negative for chest pain and palpitations.  Gastrointestinal: Negative for abdominal pain and vomiting.  Genitourinary: Negative for dysuria and hematuria.  Musculoskeletal: Negative for arthralgias and back pain.  Skin: Negative for color change and rash.  Neurological: Negative for seizures and syncope.  All other systems reviewed and are negative.    Physical Exam Updated Vital Signs BP 131/79 (BP Location: Right Arm)   Pulse (!) 112   Temp 98.2 F (36.8 C) (Oral)   Resp 18   SpO2 100%   Physical Exam Vitals signs and nursing note reviewed.  Constitutional:      General: She is not in acute distress.    Appearance: She is well-developed.  HENT:     Head: Normocephalic and atraumatic.  Eyes:     Extraocular Movements: Extraocular movements intact.     Conjunctiva/sclera: Conjunctivae normal.     Pupils: Pupils are equal, round, and reactive to light.  Neck:     Musculoskeletal: Normal range of motion and neck supple.  Cardiovascular:     Rate and Rhythm: Normal rate and regular rhythm.     Heart sounds: No murmur.  Pulmonary:     Effort: Pulmonary effort is normal. No respiratory distress.     Breath sounds: Normal breath sounds. No decreased breath sounds, wheezing, rhonchi or rales.  Abdominal:     Palpations: Abdomen is soft.     Tenderness: There is no abdominal tenderness.  Musculoskeletal:     Right lower leg: No edema.     Left lower leg: No edema.  Skin:    General: Skin is warm and dry.     Capillary Refill: Capillary refill takes less than 2 seconds.  Neurological:     Mental Status: She is alert.      ED Treatments / Results  Labs (all labs ordered are listed, but only abnormal results are displayed) Labs Reviewed  CBC WITH DIFFERENTIAL/PLATELET - Abnormal; Notable for the following components:      Result Value   RBC 3.38 (*)    Hemoglobin 10.0 (*)    HCT 30.9 (*)    All other components within normal limits   BASIC METABOLIC PANEL -  Abnormal; Notable for the following components:   CO2 18 (*)    Creatinine, Ser 1.02 (*)    GFR calc non Af Amer 55 (*)    All other components within normal limits  TROPONIN I  BRAIN NATRIURETIC PEPTIDE    EKG EKG Interpretation  Date/Time:  Tuesday January 18 2019 10:59:00 EDT Ventricular Rate:  105 PR Interval:    QRS Duration: 92 QT Interval:  348 QTC Calculation: 460 R Axis:   80 Text Interpretation:  Sinus tachycardia Borderline T wave abnormalities Confirmed by Lennice Sites [phone]) on 01/18/2019 11:03:54 AM   Radiology Ct Angio Chest Pe W And/or Wo Contrast  Result Date: 01/18/2019 CLINICAL DATA:  Evaluate for pulmonary embolism EXAM: CT ANGIOGRAPHY CHEST WITH CONTRAST TECHNIQUE: Multidetector CT imaging of the chest was performed using the standard protocol during bolus administration of intravenous contrast. Multiplanar CT image reconstructions and MIPs were obtained to evaluate the vascular anatomy. CONTRAST:  161mL OMNIPAQUE IOHEXOL 350 MG/ML SOLN COMPARISON:  01/04/2019 FINDINGS: Cardiovascular: Satisfactory opacification of the pulmonary arteries to the segmental level. No evidence of pulmonary embolism. Unchanged caliber of the tubular ascending thoracic aorta. Normal heart size. No pericardial effusion. Mediastinum/Nodes: No enlarged mediastinal, hilar, or axillary lymph nodes. Thyroid gland, trachea, and esophagus demonstrate no significant findings. Lungs/Pleura: Mild centrilobular emphysema. No pleural effusion or pneumothorax. Upper Abdomen: No acute abnormality. Musculoskeletal: No chest wall abnormality. No acute or significant osseous findings. Unchanged fluid attenuation thoracic perineural cysts. Review of the MIP images confirms the above findings. IMPRESSION: 1.  Negative examination for pulmonary embolism. 2.  Mild emphysema. Electronically Signed   By: Eddie Candle M.D.   On: 01/18/2019 14:25   Dg Chest Portable 1 View  Result Date:  01/18/2019 CLINICAL DATA:  Intermittent shortness of breath. EXAM: PORTABLE CHEST 1 VIEW COMPARISON:  CT chest 01/04/2019 FINDINGS: The lungs are hyperinflated likely secondary to COPD. There is no focal consolidation. There is no pleural effusion or pneumothorax. The heart and mediastinal contours are unremarkable. There is no acute osseous abnormality. Mild osteoarthritis of the right shoulder. IMPRESSION: No active disease. Electronically Signed   By: Kathreen Devoid   On: 01/18/2019 12:22    Procedures Procedures (including critical care time)  Medications Ordered in ED Medications  sodium chloride (PF) 0.9 % injection (has no administration in time range)  iohexol (OMNIPAQUE) 350 MG/ML injection 100 mL (100 mLs Intravenous Contrast Given 01/18/19 1351)     Initial Impression / Assessment and Plan / ED Course  I have reviewed the triage vital signs and the nursing notes.  Pertinent labs & imaging results that were available during my care of the patient were reviewed by me and considered in my medical decision making (see chart for details).     Catherine [name] is a 73 year old female who presents to the ED with shortness of breath.  Patient with history of COPD.  Patient with intermittent shortness of breath over the last several weeks.  Worse with exertion.  Normal at rest.  Patient was sent from primary care doctor's office to rule out a PE.  No signs of volume overload on exam.  Clear breath sounds.  Denies any chest pain.  EKG shows sinus tachycardia.  Will obtain troponin, BNP, chest x-ray, CT PE scan and reevaluate.  She denies any infectious symptoms.  No cough, no sputum production.  Patient with no signs of PE or pneumonia on CT scan of chest.  Patient has mild emphysema seen.  BNP normal.  Troponin normal.  EKG with no ischemic changes.  No significant anemia, electrolyte abnormality, kidney injury.  Overall no acute process for shortness of breath with exertion.  Suspect that this is  likely progressive in nature from COPD.  Recommend follow-up with primary care doctor.  May benefit from echocardiogram/pulmonary consultation outpatient.  Discharged from ED in good condition given return precautions.  This chart was dictated using voice recognition software.  Despite best efforts to proofread,  errors can occur which can change the documentation meaning.    Final Clinical Impressions(s) / ED Diagnoses   Final diagnoses:  Shortness of breath    ED Discharge Orders    None       Lennice Sites, DO 01/18/19 1442

## 2019-01-21 LAB — ACID FAST CULTURE WITH REFLEXED SENSITIVITIES (MYCOBACTERIA): Acid Fast Culture: NEGATIVE

## 2019-01-24 DIAGNOSIS — A318 Other mycobacterial infections: Secondary | ICD-10-CM | POA: Diagnosis not present

## 2019-01-26 ENCOUNTER — Encounter: Payer: Self-pay | Admitting: Infectious Disease

## 2019-01-26 ENCOUNTER — Ambulatory Visit (INDEPENDENT_AMBULATORY_CARE_PROVIDER_SITE_OTHER): Payer: PPO | Admitting: Infectious Disease

## 2019-01-26 ENCOUNTER — Other Ambulatory Visit: Payer: Self-pay

## 2019-01-26 VITALS — BP 150/65 | HR 92 | Temp 98.6°F

## 2019-01-26 DIAGNOSIS — T8579XA Infection and inflammatory reaction due to other internal prosthetic devices, implants and grafts, initial encounter: Secondary | ICD-10-CM

## 2019-01-26 DIAGNOSIS — L02214 Cutaneous abscess of groin: Secondary | ICD-10-CM

## 2019-01-26 DIAGNOSIS — I712 Thoracic aortic aneurysm, without rupture, unspecified: Secondary | ICD-10-CM

## 2019-01-26 DIAGNOSIS — H903 Sensorineural hearing loss, bilateral: Secondary | ICD-10-CM | POA: Diagnosis not present

## 2019-01-26 DIAGNOSIS — I889 Nonspecific lymphadenitis, unspecified: Secondary | ICD-10-CM | POA: Diagnosis not present

## 2019-01-26 DIAGNOSIS — M6748 Ganglion, other site: Secondary | ICD-10-CM

## 2019-01-26 DIAGNOSIS — L089 Local infection of the skin and subcutaneous tissue, unspecified: Secondary | ICD-10-CM | POA: Diagnosis not present

## 2019-01-26 DIAGNOSIS — M67451 Ganglion, right hip: Secondary | ICD-10-CM

## 2019-01-26 DIAGNOSIS — L03314 Cellulitis of groin: Secondary | ICD-10-CM | POA: Diagnosis not present

## 2019-01-26 DIAGNOSIS — T8579XD Infection and inflammatory reaction due to other internal prosthetic devices, implants and grafts, subsequent encounter: Secondary | ICD-10-CM

## 2019-01-26 DIAGNOSIS — A318 Other mycobacterial infections: Secondary | ICD-10-CM | POA: Diagnosis not present

## 2019-01-26 DIAGNOSIS — R0609 Other forms of dyspnea: Secondary | ICD-10-CM

## 2019-01-26 DIAGNOSIS — L049 Acute lymphadenitis, unspecified: Secondary | ICD-10-CM

## 2019-01-26 HISTORY — DX: Infection and inflammatory reaction due to other internal prosthetic devices, implants and grafts, initial encounter: T85.79XA

## 2019-01-26 NOTE — Progress Notes (Signed)
Complaint: Follow-up for Mycobacterium  fortuitum groin infection   Subjective:    Patient ID: Catherine [name], female    DOB: 1946-06-06, 73 y.o.   MRN: [id number]  HPI  Catherine [name] is a quite pleasant 73 year old Caucasian female with a past medical history examined for thoracic aneurysm, anxiety, social cystitis who in 2000 1617 developed a cystic lesion in her right groin.  She had this excised by general surgery on June 17 and pathology came back consistent with a lymphangioma and benign lymph nodes.  She then later underwent excision and repair of a pelvic mass which turned out on pathology be consistent with a synovial cyst, and also underwent bilateral femoral hernia repair and left inguinal hernia repair in June 2019.  In November 2019 she developed a proximal thigh mass which was excised and also found to be consistent with a synovial cyst and benign lymph nodes.  She had worsening swelling in her right groin and underwent incision and drainage of what appeared to be a seroma on September 20, 2018.  Postoperatively in the ensuing week she had worsening erythema and swelling and drainage.  She also developed overt fevers and malaise and was started on doxycycline with resolution of her fevers and malaise.  However just prior to surgery she was having worsening erythema in this area and started on doxycycline without any improvement in her symptoms.  She underwent right groin and inner thigh exploration with excision of subcutaneous lymph nodes on November 02, 2018 in the OR there was some yellow cheesy material found consistent with necrosis.  Pathology report showed necrotizing granulomatous inflammation in the soft tissue as well as the 3 lymph nodes that were excised.  Stains were negative but cultures have subsequently grown nontuberculous mycobacteria and ensuing few weeks.  This nontuberculous mycobacteria was probe not just for TB but M gordonae I and M avium and kansasii I and none of  these organisms were present.  Saw her in clinic we do not yet have the identity of the organism.  We held off on initiating antimicrobial therapy.  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  I had an electronic visit with her and we decided to do initiate doxycycline and Bactrim despite her history of sulfa allergies given that these 2 drugs had reasonable probability of being active against her organism.  She had in the interim developed worsening blistering and erythema in her groin.  We also had had a CT scan done which had shown:  Fluid collection in the right inguinal region, extending medially into the upper right perineal soft tissues concerning for postoperative abscess, 6.4 x 1.4 cm. Areas of enhancement also noted in the adjacent abductor muscles which on the coronal imaging appear to represent small intramuscular abscesses.  Time unfortunately she has done worse with worsening drainage which she shows me today.  She has worsening pain in her groin and induration of this area as well as the labia which is somewhat distorted.  Microbiologic sensitivity of come back largely on this Mycobacterium fortuitum.    Choices of antimicrobial therapy are limited as described below.  I felt strongly that she needed surgery with an I&D and she was admitted to [facility] long and underwent formal incision and debridement by Dr. Johney Maine.  Cultures yielded MRSA as well as Mycobacterium fortuitum now showing intermediate susceptibility to Zyvox.  I last saw her she was havingworsening dyspnea and some chest discomfort since being on Zyvox.  She has some dizziness with  standing.  I was concerned that she might have bone marrow toxicity from Zyvox and anemia that might explain her symptoms.  She was indeed orthostatic on checking her blood pressure from lying to standing.  However stat labs that were done and came back show her to be anemic but not sufficiently anemic to explain her  symptoms.  I asked her to stop her Zyvox and clofazimine.  Ended up going to the emergency department (73 y.o.) getting a CT angiogram that ruled out pulmonary embolism.  Since stopping the Zyvox and the clofazimine she felt much better.  Given the complicated nature of her case and in particular the difficulty navigating antibiotics I reached out to Dr. Brigitte Pulse at [facility].  Dr. Lorenda Cahill felt strongly that this was a case that needed surgical reconsideration and certainly 1 that he needed to look over in person.  She therefore traveled to [facility] on Monday and was seen by Dr. Lorenda Cahill.  Dr. Lorenda Cahill has come to the conclusion after thoroughly reviewing the radiographs and the laboratory data susceptibility patterns and clinical case and feels very strongly that the patient very likely has infection of her mesh and that the mesh will need to be removed to affect cure.  Certainly if the mesh is involved and I have also had anxiety about this there is no way wanted to cure his infection without eradication of this material.  Furthermore as Dr. Lorenda Cahill points out the antimicrobials that we are having to use against this organism are not without trivial risk to this patient.  The only antimicrobials were left to give her that are viable would be the clofazimine (and we need to get S for this abx) and then tigecycline which would be very difficult for her to come to tolerate given the intense nausea and will require a PICC line, amikacin which would have a high risk of nephrotoxicity and ototoxicity in a patient who already has sensorineural hearing loss, and a fluoroquinolone, the latter of which Dr. Lorenda Cahill would prefer to use but which would carry the risk of aortic rupture. Dr. Lorenda Cahill felt that it would be worth trying FQ (namely moxifloxacin) and clofazamine given that the risk is farly low at estimated 82 cases of anursme of disseection by 60 days based on [place]  study.  He ended using these 2 antibiotics after surgical control of the infection including wide excision and mesh removal was achieved and that then the patient could be followed for large amount of her aortic aneurysm while on therapy and for 60 days after this.  Patient states that her right groin is having increased swelling and induration on the proximal aspect which she showed me today.      Past Medical History:  Diagnosis Date   Anxiety    Aortic aneurysm ([place]) 12/08/2018   Arthritis    HANDS   Chronic constipation    Complication of anesthesia    upon waking up from anesthesia patient experienced chest pain - EKG done was negative, patient states "it may have been indigestion"   COPD (chronic obstructive pulmonary disease) (HCC)    DDD (degenerative disc disease), lumbar    L2-3/3-4/4-5   Dizziness 01/17/2019   DOE (dyspnea on exertion) 01/17/2019   Dysuria    Frequency of urination    Ganglion cyst of right groin 11/23/2018   GERD (gastroesophageal reflux disease)    Hypertension    Infected hernioplasty mesh ([place]) 01/26/2019   Migraine  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 HUMPHRIES/GRAPEY   Wears glasses     Past Surgical History:  Procedure Laterality Date   ABDOMINAL HYSTERECTOMY  1982   W/  UNILATERAL SALPINGOOPHORECTOMY   bilateral hernia surgery      removed ganglion cyst and swollen lymph node    CHOLECYSTECTOMY OPEN  1987   W/  APPENDECTOMY   COLONOSCOPY  2002  &  12-2012 & 03/19/15   CYSTO WITH HYDRODISTENSION N/A 02/20/2014   Procedure: [place] AND MARCAINE INSTILLATION;  Surgeon: Bernestine Amass, MD;  Location: [facility];  Service: Urology;  Laterality: N/A;   EXCISION MASS LOWER  EXTREMETIES Right 11/02/2018   Procedure: REMOVAL OF RIGHT GROIN AND THIGH SUBCUTANEOUS MASSES.;  Surgeon: Michael Boston, MD;  Location: [facility];  Service: General;  Laterality: Right;   EYE SURGERY     bilateral cataract surgery    INCISION AND DRAINAGE ABSCESS N/A 12/09/2018   Procedure: INCISION AND DRAINAGE RIGHT GROIN and thigh  WITH DEBRIDEMENT;  Surgeon: Michael Boston, MD;  Location: [facility];  Service: General;  Laterality: N/A;   LAPAROSCOPIC UNILATERAL SALPINGOOPHORECTOMY  1984   MASS EXCISION Right 06/16/2018   Procedure: REMOVAL OF RIGHT GROIN SUBCUTANEOUS MASS ERAS PATHWAY;  Surgeon: Michael Boston, MD;  Location: [facility];  Service: General;  Laterality: Right;   removal of lymph node cyst in right groin      ROTATOR CUFF REPAIR Right 2003   and BONE SPUR    Family History  Problem Relation Age of Onset   Esophageal cancer Mother 38   Lung cancer Father 27      Social History   Socioeconomic History   Marital status: Married    Spouse name: Not on file   Number of children: Not on file   Years of education: Not on file   Highest education level: Not on file  Occupational History   Not on file  Social Needs   Financial resource strain: Not on file   Food insecurity    Worry: Not on file    Inability: Not on file   Transportation needs    Medical: Not on file    Non-medical: Not on file  Tobacco Use   Smoking status: Former Smoker    Packs/day: 0.75    Years: 48.00    Pack years: 36.00    Types: Cigarettes    Quit date: 09/04/2017    Years since quitting: 1.3   Smokeless tobacco: Never Used  Substance and Sexual Activity   Alcohol use: No   Drug use: No   Sexual activity: Not on file  Lifestyle   Physical activity    Days per week: Not on file    Minutes per session: Not on file   Stress: Not on file  Relationships   Social connections    Talks on phone: Not on file    Gets together: Not on file    Attends religious service: Not on  file    Active member of club or organization: Not on file    Attends meetings of clubs or organizations: Not on file    Relationship status: Not on file  Other Topics Concern   Not on file  Social History Narrative  Not on file    Allergies  Allergen Reactions   Ciprofloxacin Hcl Other (See Comments)    Aortic aneurysm per MD   Metoprolol Other (See Comments) and Cough    and wheezing   Penicillins Shortness Of Breath, Rash and Other (See Comments)    Has patient had a PCN reaction causing immediate rash, facial/tongue/throat swelling, SOB or lightheadedness with hypotension: Yes Has patient had a PCN reaction causing severe rash involving mucus membranes or skin necrosis: No Has patient had a PCN reaction that required hospitalization: Yes - MD office Has patient had a PCN reaction occurring within the last 10 years: No If all of the above answers are "NO", then may proceed with Cephalosporin use.    Oxycodone Other (See Comments)    Nightmares.  Tolerates tramadol   Advair Diskus [Fluticasone-Salmeterol] Anxiety   Sulfa Antibiotics Rash     Current Outpatient Medications:    acetaminophen (TYLENOL) 500 MG tablet, Take 1,000 mg by mouth at bedtime. , Disp: , Rfl:    albuterol (PROVENTIL HFA;VENTOLIN HFA) 108 (90 Base) MCG/ACT inhaler, Inhale 2 puffs into the lungs every 4 (four) hours as needed for wheezing or shortness of breath., Disp: , Rfl:    AMBULATORY NON FORMULARY MEDICATION, Take 2 capsules by mouth daily. Medication Name: clofazimine (Patient not taking: Reported on 01/18/2019), Disp: 60 capsule, Rfl: 11   budesonide-formoterol (SYMBICORT) 160-4.5 MCG/ACT inhaler, Inhale 2 puffs into the lungs 2 (two) times daily., Disp: , Rfl:    calcium elemental as carbonate (BARIATRIC TUMS ULTRA) 400 MG chewable tablet, Chew 2 tablets by mouth daily. , Disp: , Rfl:    carboxymethylcellulose (REFRESH PLUS) 0.5 % SOLN, Place 2 drops into both eyes 3 (three) times daily.  , Disp: , Rfl:    Carboxymethylcellulose Sodium (REFRESH LIQUIGEL) 1 % GEL, Place 1 drop into both eyes at bedtime., Disp: , Rfl:    cetirizine (ZYRTEC) 10 MG tablet, Take 10 mg by mouth daily., Disp: , Rfl:    Cholecalciferol (VITAMIN D3) 5000 units CAPS, Take 5,000 Units by mouth daily. , Disp: , Rfl:    Cyanocobalamin (VITAMIN B-12 PO), Take 2,500 mcg by mouth daily. , Disp: , Rfl:    denosumab (PROLIA) 60 MG/ML SOSY injection, Inject 60 mg into the skin every 6 (six) months., Disp: , Rfl:    diltiazem (TAZTIA XT) 360 MG 24 hr capsule, Take 360 mg by mouth daily., Disp: , Rfl:    docusate sodium (COLACE) 100 MG capsule, Take 200 mg by mouth daily. , Disp: , Rfl:    esomeprazole (NEXIUM) 20 MG capsule, Take 20 mg by mouth daily., Disp: , Rfl:    fluticasone (FLONASE) 50 MCG/ACT nasal spray, Place 1 spray into both nostrils daily. , Disp: , Rfl:    gabapentin (NEURONTIN) 100 MG capsule, Take 100 mg by mouth at bedtime. , Disp: , Rfl:    linezolid (ZYVOX) 600 MG tablet, Take 1 tablet (600 mg total) by mouth 2 (two) times daily. (Patient not taking: Reported on 01/18/2019), Disp: 60 tablet, Rfl: 5   LORazepam (ATIVAN) 1 MG tablet, Take 0.5-1 mg by mouth See admin instructions. Take 0.5 mg by mouth in the morning and take 1 mg by mouth at bedtime, Disp: , Rfl:    montelukast (SINGULAIR) 10 MG tablet, Take 10 mg by mouth at bedtime. , Disp: , Rfl:    naproxen sodium (ALEVE) 220 MG tablet, Take 2 tablets (440 mg total) by mouth every morning. (Patient not taking:  Reported on 01/18/2019), Disp: , Rfl:    polyethylene glycol (MIRALAX / GLYCOLAX) packet, Take 17 g by mouth daily., Disp: , Rfl:    tiotropium (SPIRIVA HANDIHALER) 18 MCG inhalation capsule, Place 18 mcg into inhaler and inhale daily. , Disp: , Rfl:     Review of Systems  Constitutional: Negative for chills and fever.  HENT: Negative for congestion and sore throat.   Eyes: Negative for photophobia.  Respiratory: Negative  for cough, chest tightness, shortness of breath and wheezing.   Cardiovascular: Negative for chest pain, palpitations and leg swelling.  Gastrointestinal: Negative for abdominal distention, abdominal pain, blood in stool, constipation, diarrhea, nausea and vomiting.  Genitourinary: Negative for dysuria, flank pain and hematuria.  Musculoskeletal: Negative for back pain and myalgias.  Skin: Positive for color change and wound. Negative for rash.  Neurological: Positive for dizziness. Negative for weakness and headaches.  Hematological: Positive for adenopathy. Does not bruise/bleed easily.  Psychiatric/Behavioral: Negative for agitation, behavioral problems, confusion, decreased concentration and suicidal ideas. The patient is nervous/anxious.        Objective:   Physical Exam Vitals signs and nursing note reviewed. Exam conducted with a chaperone present.  Constitutional:      General: She is not in acute distress.    Appearance: She is not diaphoretic.  HENT:     Head: Normocephalic and atraumatic.     Right Ear: External ear normal.     Left Ear: External ear normal.     Nose: Nose normal.     Mouth/Throat:     Pharynx: No oropharyngeal exudate.  Eyes:     General: No scleral icterus.    Conjunctiva/sclera: Conjunctivae normal.  Neck:     Musculoskeletal: Normal range of motion and neck supple.  Cardiovascular:     Rate and Rhythm: Regular rhythm. Tachycardia present.     Heart sounds: Normal heart sounds. No murmur. No friction rub. No gallop.   Pulmonary:     Effort: Pulmonary effort is normal. No respiratory distress.     Breath sounds: Normal breath sounds. No stridor. No wheezing, rhonchi or rales.  Abdominal:     General: Bowel sounds are normal.     Palpations: Abdomen is soft.     Tenderness: There is no rebound.  Genitourinary:    Exam position: Supine.     Labia:        Right: Tenderness present.   Musculoskeletal: Normal range of motion.        General: No  tenderness.  Lymphadenopathy:     Cervical: No cervical adenopathy.     Lower Body: Right inguinal adenopathy present.  Skin:    General: Skin is warm and dry.     Coloration: Skin is not jaundiced or pale.     Findings: No erythema or rash.  Neurological:     General: No focal deficit present.     Mental Status: She is alert and oriented to person, place, and time.     Coordination: Coordination normal.  Psychiatric:        Mood and Affect: Mood normal.        Behavior: Behavior normal.        Thought Content: Thought content normal.        Judgment: Judgment normal.   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   Dr Lorenda Cahill recommends wide surgical I and D with mesh removal due to high concern re mesh involvement from orignal hernia repair with organism undoubtedly haviing formed biofilms if involving this mesh and if involved again would make medical cure impossible  He recommends following surgery with 2 drug regimen of moxifloxacin x4 months minimum with monitoring for aortic aneurysm rupture during that time.  In for 60 days after completing therapy  Already reached out to Dr. Alwyn Pea re Dr. Romie Levee thoughts and he and I will discuss further when he finishes clinic. He told me that he was of a mind to get imaging first prior to performing surgery  Aortic aneurysm: The above recommendations by Dr. Lorenda Cahill I will CC her cardiothoracic surgery and  DOE and Dizziness, Gi upset: all resolved with stopping abx

## 2019-02-02 ENCOUNTER — Ambulatory Visit: Payer: PPO | Admitting: Infectious Disease

## 2019-02-14 ENCOUNTER — Ambulatory Visit: Payer: Self-pay | Admitting: Surgery

## 2019-02-14 ENCOUNTER — Other Ambulatory Visit: Payer: Self-pay | Admitting: Infectious Disease

## 2019-02-14 DIAGNOSIS — A319 Mycobacterial infection, unspecified: Secondary | ICD-10-CM

## 2019-02-14 NOTE — H&P (Signed)
Catherine [name] Documented: 02/14/2019 1:30 PM Location: [facility] Patient #: [id number] DOB: September 29, 1945 Married / Language: [name] / Race: White Female   History of Present Illness Catherine Hector MD; 02/14/2019 2:02 PM) The patient is a 73 year old female presenting for a post-operative visit. Note for "Post-Operative": ` ` ` The patient returns  s/p excision of cyst in right groin 01/2016. Pathology c/w lymphagioma & benign LN s/p excision of inner pelvic mass, bilateral femoral hernia repair, left inguinal hernia repair 01/2018. Pathology c/w synovial cyst s/p proximal thigh mass 06/2018. Pathology c/w syniovial cyst & benign LN Incision and drainage of small seroma right lower groin incision 09/20/2018 Preoperative incision and drainage of right groin and thigh abscesses with debridement 12/09/2018   Patient returns after seeing infectious disease at [facility] as well as more local follow-up with Dr. Drucilla Schmidt.  She has been off antibiotics for a month and has recurrent pain and swelling concerning for persistent mycobacterial infection. Recommendation made for removal of mesh since infection and right groin and concerned that mesh is now infected as well. Patient is able to walk and function. Does not feel the thighs at swollen but the right groin is. She is ready to consider surgery     12/09/2018  12:04 PM  PATIENT: Catherine [name] 73 y.o. female  Patient Care Team: Jonathon Jordan, MD as PCP - General (Family Medicine) Felecia Shelling, Nanine Means, MD as Consulting Physician (Neurology) Michael Boston, MD as Consulting Physician (General Surgery) Ronald Lobo, MD as Consulting Physician (Gastroenterology) Tommy Medal, Lavell Islam, MD as Consulting Physician (Infectious Diseases) Jackelyn Knife, MD as Rounding Team (Internal Medicine)  PRE-OPERATIVE DIAGNOSIS: RIGHT GROIN & THIGH ABSCESSES  POST-OPERATIVE DIAGNOSIS: RIGHT GROIN & THIGH  ABSCESSES   PROCEDURE: INCISION AND DRAINAGE RIGHT GROIN AND THIGH ABSCESSES DEBRIDEMENT  SURGEON: Catherine Hector, MD  ASSISTANT: OR Staff  ANESTHESIA: local and general  EBL: Total I/O In: 277 [I.V.:913] Out: 20 [Blood:20]  Delay start of Pharmacological VTE agent (>24hrs) due to surgical blood loss or risk of bleeding: no  DRAINS: none  SPECIMEN: Right groin and thigh skin, dermis, subcutaneous tissue, fascia for culture and pathology  DISPOSITION OF SPECIMEN: MICROBIOLOGY & PATHOLOGY  COUNTS: YES  PLAN OF CARE: Admit to inpatient  PATIENT DISPOSITION: PACU - hemodynamically stable.  INDICATION: Former smoking female with right groin masses requiring numerous operations. S/p excision of cyst in right groin 01/2016. Pathology c/w lymphagioma & benign LN s/p lap TEP excision of inner pelvic mass, bilateral femoral hernia repair, left inguinal hernia repair 01/2018. Pathology c/w synovial cyst s/p excision proximal thigh mass 06/2018. Pathology c/w syniovial cyst & benign LN Incision and drainage of small seroma right lower groin incision 09/20/2018 Right groin and inner thigh exploration with excision of subcutaneous and lymph node 11/02/2018  Development of chronic wound infection. Growing atypical Mycobacterium species. Followed by infectious disease. Progression of inflammation despite antibiotic regimen. Admitted for worsening cellulitis and pain. Recommendation made for operative incision and drainage of abscesses and debridement of chronically inflamed infected tissue The anatomy and physiology of skin abscesses was discussed. Pathophysiology of SQ abscess, possible progression to fasciitis & sepsis, etc discussed . I stressed good hygiene & wound care. Possible redebridement was discussed as well.  Possibility of recurrence was discussed. Risks, benefits, alternatives were discussed. I noted a good likelihood this will help address the problem. Risks of  anesthesia and other risks discussed. Questions answered. The patient is does wish to proceed.  OR FINDINGS: Swollen indurated right groin and inner thigh incisions with cellulitis and scant purulent drainage. Some tissue necrosis/infection.    Incision done in right groin and right thigh through skin, dermis, subcutaneous tissues, down to level of fascia. Excision with surgical scalpel and cautery.  Right groin wound 5 x 2 cm. Goes 2 cm deep.  Right thigh wound 7 x 3 cm with undermining. Goes 3 cm deep   DESCRIPTION:  Informed consent was confirmed. The patient remained on oral linezolid & clofazimine per infectious disease. The patient underwent general anesthesia without any difficulty. The patient was positioned supine. SCDs were active during the entire case. The area around the abscess was prepped and draped in a sterile fashion. A surgical timeout confirmed our plan.  I made an incision over the most fluctuant area of the right groin and right thigh inflamed former incisions. Purulent material expressed. Sent for aerobic and anaerobic culture. I placed my finger into the abscess cavity to break up loculations. The incision was extended to adequately expose the entire cavity. I extended the wound to expose the chronically inflamed necrotic and infected tissue. I used scalpel and scissors to excise necrotic and infected tissue down to the level of the anterior abdominal wall fascia on the right groin and the anterior inner fascia of the right thigh. We did irrigation. The fascia was viable. Excess skin excised to have nice open wounds. Right thigh wound undermined it cephalad for the central area of swelling not associated with incision. We took extra care to ensure hemostasis. Tissue was sent for culture and pathology. We called and discussed with microbiology to make sure that appropriate specimens were obtained and handled  The wound was packed with saline moistened gauze.  Sterile dressings applied. Patient is being extubated go to recovery room. Plan is to continue antibiotic regimen. I did do a field block of local anesthetic. We will begin dressing care tomorrow. Hopefully go home tomorrow. Per the patient's request, I did not call her spouse or any other family member.  Catherine [name], M.D., F.A.C.S. Gastrointestinal and Minimally Invasive Surgery [facility] [address] [place], [place] [address] [phone] Main / Paging     Pathology: Diagnosis Synovium, cyst, right groin - BENIGN CYST, COMPATIBLE WITH CLINICALLY STATED SYNOVIAL CYST. - BENIGN LYMPH NODE. Jaquita Folds MD Pathologist, Electronic Signature (Case signed 06/18/2018) Specimen Tylar Amborn and Clinical Information Specimen(s) Obtained: Synovium, cyst, right groin Specimen Clinical Information right groin subcutaneous mass, 3 cm x 2 cm (kp) Delonta Yohannes Received in formalin are multiple irregular portions of fibroadipose tissue measuring 4.5 x 4.5 x 1.5 cm in aggregate. There is clear mucinous fluid present on the surface of the tissue. An intact cyst is not grossly identified. The cut surface shows an ill-defined firm tan-white nodular area measuring up to 1.8 cm. Sections are submitted in three cassettes. (GP:ah 06/17/18) Report signed out from the following location(s) Technical Component was performed at [facility], [place], [place] [address]. CLIA #: [id number], Interpretation was performed at [facility], [place],  [address]. CLIA #: [id number],  Marland Kitchen 06/16/2018  5:37 PM  PATIENT: Catherine [name] 73 y.o. female  Patient Care Team: Jonathon Jordan, MD as PCP - General (Family Medicine) Felecia Shelling, Nanine Means, MD as Consulting Physician (Neurology) Michael Boston, MD as Consulting Physician (General Surgery) Ronald Lobo, MD as Consulting Physician  (Gastroenterology)  PRE-OPERATIVE DIAGNOSIS: Right groin subcutaneous mass,  3 cm x 2 cm  POST-OPERATIVE DIAGNOSIS: Right groin subcutaneous masses, largest 4 cm x 3 x 3 cm  PROCEDURE: REMOVAL OF RIGHT GROIN / THIGH MASSES  SURGEON: Catherine Hector, MD  ASSISTANT: RN  ANESTHESIA: Local anesthetic and field block in the subcutaneous tissues. No deep local anesthetic General  I&O Total I/O In: 965 [I.V.:915; IV Piggyback:50] Out: 10 [Blood:10]  Delay start of Pharmacological VTE agent (>24hrs) due to surgical blood loss or risk of bleeding: NO  DRAINS: none  SPECIMEN: No Specimen  DISPOSITION OF SPECIMEN: N/A  COUNTS: YES  PLAN OF CARE: Discharge to home after PACU  PATIENT DISPOSITION: PACU - hemodynamically stable.  INDICATION: Pleasant woman was right inguinal mass excised consistent with lymphocele. Recurrent pain and swelling and found to have a femoral hernia and femoral canal cystic mass. Excised. Consistent with a synovial cyst. Femoral hernia repaired. Initially did well. Then felt new swelling in her inner thigh away from her prior areas. CT scan showed fluid collection. Aspirated. Thick fluid concerning for recurrent synovial cyst. Mass recurred. Patient requested for groin exploration with possible excision.  The pathophysiology of skin & subcutaneous masses was discussed. Natural history risks without surgery were discussed. I recommended surgery to remove the mass. I explained the technique of removal with use of local anesthesia & possible need for more aggressive sedation/anesthesia for patient comfort.   Risks such as bleeding, infection, wound breakdown, heart attack, death, and other risks were discussed. I noted a good likelihood this will help address the problem. Possibility that this will not correct all symptoms was explained. Possibility of regrowth/recurrence of the mass was discussed. We will work to minimize complications.  Questions were answered. The patient expresses understanding & wishes to proceed with surgery.  OR FINDINGS: Simple cystic mass 4 x 3 x 3 cm on proximal inner right thigh consistent with recurrent synovial cyst. Smaller firm nodules in a cluster heading towards the femoral foramen concerning for a small synovial cyst as well. Excised.  Blake drain left in place  DESCRIPTION:  The patient was identified & brought into the operating room. The patient was positioned supine with arms tucked. SCDs were active during the entire case. The patient underwent general anesthesia without any difficulty. The abdomen was prepped and draped in a sterile fashion. A Surgical Timeout confirmed our plan.  Made an oblique incision in the medial right groin just above the crease between the thigh region. Came to the subcutaneous tissues and the deep subcutaneous tissues going towards the right inner thigh was a cystic mass. Able to come around it. Was decompressed and had thick tan syrupy brown fluid within it. Concerning for recurrent synovial cyst. Skeletonized the more distal aspects on the proximal thigh and placed medium-size clips on it. Elevate the mass off the inner thigh muscular compartment and excised the larger mass. I could feel some small PE and small marble sized cystic masses with fibrosis heading towards the femoral ring underneath the inguinal ligament. Skeletonized this region carefully excised it. Placed clips just at the femoral foramen. Took care to avoid any vascular or nerve injury. Hemostasis was good.  I did copious irrigation over a liter and reinspection. Remaining tissues were soft with no evidence of any persistent cystic masses or other areas of concern. Most compartments intact. Hemostasis good. No evidence of any neurovascular injury. Resulting 10 x 5 x 4 cm wound. Side to place a 15 [place] Blake drain from the right lower quadrant through the subcutaneous  tissues into  this inguinal/proximal thigh pocket. The skin with 2-0 Prolene suture. Closed with 3-0 Vicryl interrupted sutures at Scarpa's and 4 Monocryl suture at the deep dermal subcuticular running fashion. Dermabond placed.  Patient extubated in recovery room. Talkative. Has some soreness but moving extremities normally. I discussed operative findings, updated the patient's status, discussed probable steps to recovery, and gave postoperative recommendations to the patient's spouse. Recommendations were made. Questions were answered. He expressed understanding & appreciation. I had discussed postoperative care with the patient in the holding area.   Catherine [name], M.D., F.A.C.S. Gastrointestinal and Minimally Invasive Surgery [facility] 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)  VISIT FOR WOUND CHECK (Z51.89)  CHANGE OR REMOVAL OF DRAINS (Z48.03)  GROIN PAIN, CHRONIC, RIGHT (R10.31)  RIGHT INGUINAL HERNIA (K40.90)  PREOP - ING HERNIA - ENCOUNTER FOR PREOPERATIVE EXAMINATION FOR GENERAL SURGICAL PROCEDURE (Z01.818)  OTHER GANGLION AND CYST OF SYNOVIUM, TENDON, AND BURSA (M67.49, M67.89,M71.39)  BILATERAL FEMORAL HERNIA WITHOUT OBSTRUCTION OR GANGRENE, RECURRENCE NOT SPECIFIED (K41.20)  MASS OF THIGH, RIGHT (R22.41)  ACID-FAST BACTERIA PRESENT (R89.9)   Allergies (Sabrina Canty, CMA; 02/14/2019 1:30 PM) Penicillin G Benzathine & Proc *PENICILLINS*  Rash Sulfabenzamide *CHEMICALS*  rash Cipro *FLUOROQUINOLONES*  Allergies Reconciled   Medication History (Sabrina Canty, CMA; 02/14/2019 1:31 PM) Gabapentin (100MG   Capsule, 1 (one) Capsule Oral two times daily, as needed, Taken starting 09/13/2018) Active. (Start 100mg  twice a day - can increase gradually to 300mg  3x/day) Linezolid (600MG  Tablet, Oral) Active. Clofazimine (50MG  Capsule, Oral) Active. Prolia (60MG /ML Soln Pref Syr, Subcutaneous) Active. Calcium Carbonate (Oral) Specific strength unknown - Active. Vitamin B-12 (5000MCG Tablet Disint, Oral) Active. Cetirizine HCl (5MG  Tablet, Oral) Active. Flonase (50MCG/ACT Suspension, Nasal) Active. Ibuprofen (Oral) Specific strength unknown - Active. LORazepam (1MG  Tablet, Oral) Active. DiltiaZEM HCl ER Beads (360MG  Capsule ER 24HR, Oral) Active. Symbicort (160-4.5MCG/ACT Aerosol, Inhalation) Active. Albuterol Sulfate ((2.5 MG/3ML)0.083% Nebulized Soln, Inhalation) Active. Sucralfate (1GM Tablet, Oral) Active. NexIUM (40MG  Capsule DR, Oral) Active. Vitamin D (Cholecalciferol) (1000UNIT Capsule, Oral) Active. Medications Reconciled  Vitals (Sabrina Canty CMA; 02/14/2019 1:31 PM) 02/14/2019 1:31 PM Weight: 165 lb Height: 70.5in Body Surface Area: 1.93 m Body Mass Index: 23.34 kg/m  Temp.: 97.4F (Oral)  Pulse: 111 (Regular)  BP: 128/62(Sitting, Left Arm, Standard)       Physical Exam Catherine Hector MD; 02/14/2019 2:03 PM) General Mental Status-Alert. General Appearance-Not in acute distress. Voice-Normal.  Integumentary Global Assessment Normal Exam - Distribution of scalp and body hair is normal. General Characteristics Overall Skin Surface - no rashes and no suspicious lesions.  Head and Neck Head-normocephalic, atraumatic with no lesions or palpable masses. Face Global Assessment - atraumatic, no absence of expression. Neck Global Assessment - no abnormal movements, no decreased range of motion. Trachea-midline. Thyroid Gland Characteristics - non-tender.  Eye Eyeball - Left-Extraocular movements intact, No Nystagmus - Left. Eyeball  - Right-Extraocular movements intact, No Nystagmus - Right. Upper Eyelid - Left-No Cyanotic - Left. Upper Eyelid - Right-No Cyanotic - Right.  Chest and Lung Exam Inspection Accessory muscles - No use of accessory muscles in breathing.  Female Genitourinary Note: Right lower groin incision closed down to with some swelling. No frank drainage. Concerning  for subcutaneous nodularity/infection  Right inner thigh incision closed down with no erythema or medial swelling. Some lateral subcutaneous nodularity.  No more pain. No cellulitis. Not fully close down and a couple small subcutaneous nodules in the region but overall improved   Peripheral Vascular Upper Extremity Inspection - Not Gangrenous, No Petechiae. Inspection - Not Gangrenous, No Petechiae.  Neurologic Neurologic evaluation reveals -normal attention span and ability to concentrate, able to name objects and repeat phrases. Appropriate fund of knowledge and normal coordination.  Neuropsychiatric Mental status exam performed with findings of-able to articulate well with normal speech/language, rate, volume and coherence and no evidence of hallucinations, delusions, obsessions or homicidal/suicidal ideation. Orientation-oriented X3.  Musculoskeletal Global Assessment Gait and Station - normal gait and station.  Lymphatic General Lymphatics Description - No Generalized lymphadenopathy.    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  Concerned that infection will not clear without mesh removal. Second opinion at [facility] concurs as well. Patient with recurrent swelling and nodularity off antibiotics.  Discussed at length with her. When plan laparoscopic preperitoneal approach to remove the preperitoneal mesh. Initially was  thinking of replacing with absorbable Phasix mesh, but I think I would remove and not replace any mesh at this point. Right groin excisional debridement of any nodularity with open wounds. Reculture. Then plan antibiotics per infectious disease.  I believe they are leaning more to reconsidering fluoroquinolone and monitoring her small but stable aortic aneurysm closely. Dr. Cyndia Bent follows her aneurysm & is aware.  Current Plans You are being scheduled for surgery- Our schedulers will call you.  You should hear from our office's scheduling department within 5 working days about the location, date, and time of surgery. We try to make accommodations for patient's preferences in scheduling surgery, but sometimes the OR schedule or the surgeon's schedule prevents [place] from making those accommodations.  If you have not heard from our office [phone]) in 5 working days, call the office and ask for your surgeon's nurse.  If you have other questions about your diagnosis, plan, or surgery, call the office and ask for your surgeon's nurse.  Written instructions provided Pt Education - [facility] Pain Control (Darrold Bezek) Pt Education - [facility] Hernia Post-Op HCI (Jaydin Jalomo): discussed with patient and provided information. The anatomy & physiology of the abdominal wall and pelvic floor was discussed. The pathophysiology of hernias and infection in the inguinal and pelvic region was discussed. Natural history risks such as progressive enlargement, pain, incarceration, and strangulation was discussed. Contributors to complications such as smoking, obesity, diabetes, prior surgery, etc were discussed.  I feel the risks of no intervention will lead to serious problems that outweigh the operative risks; therefore, I recommended surgery to remove mesh and debridement the groin to remove chronic infection. I explained laparoscopic techniques with possible need for an open approach. I noted usual use of mesh to patch and/or  buttress hernia repair  Risks such as bleeding, infection, abscess, need for further treatment, heart attack, major artery or vascular injury, lymphedema, nerve injury or numbness , death, and other risks were discussed. I noted a good likelihood this will help address the problem. Goals of post-operative recovery were discussed as well. Possibility that this will not correct all symptoms was explained. I stressed the importance of low-impact activity, aggressive pain control, avoiding constipation, & not pushing through pain to  minimize risk of post-operative chronic pain or injury. Possibility of reherniation was discussed. We will work to minimize complications.  An educational handout further explaining the pathology & treatment options was given as well. Questions were answered. The patient expresses understanding & wishes to proceed with surgery.

## 2019-02-14 NOTE — H&P (View-Only) (Signed)
Catherine [name] Documented: 02/14/2019 1:30 PM Location: [facility] Patient #: [id number] DOB: 05-Jan-1946 Married / Language: [name] / Race: White Female   History of Present Illness [name] MD; 02/14/2019 2:02 PM) The patient is a 73 year old female presenting for a post-operative visit. Note for "Post-Operative": ` ` ` The patient returns  s/p excision of cyst in right groin 01/2016. Pathology c/w lymphagioma & benign LN s/p excision of inner pelvic mass, bilateral femoral hernia repair, left inguinal hernia repair 01/2018. Pathology c/w synovial cyst s/p proximal thigh mass 06/2018. Pathology c/w syniovial cyst & benign LN Incision and drainage of small seroma right lower groin incision 09/20/2018 Preoperative incision and drainage of right groin and thigh abscesses with debridement 12/09/2018   Patient returns after seeing infectious disease at [facility] as well as more local follow-up with Dr. Drucilla Schmidt.  She has been off antibiotics for a month and has recurrent pain and swelling concerning for persistent mycobacterial infection. Recommendation made for removal of mesh since infection and right groin and concerned that mesh is now infected as well. Patient is able to walk and function. Does not feel the thighs at swollen but the right groin is. She is ready to consider surgery     12/09/2018  12:04 PM  PATIENT: Catherine [name] 73 y.o. female  Patient Care Team: Jonathon Jordan, MD as PCP - General (Family Medicine) Felecia Shelling, Nanine Means, MD as Consulting Physician (Neurology) Michael Boston, MD as Consulting Physician (General Surgery) Ronald Lobo, MD as Consulting Physician (Gastroenterology) Tommy Medal, Lavell Islam, MD as Consulting Physician (Infectious Diseases) Jackelyn Knife, MD as Rounding Team (Internal Medicine)  PRE-OPERATIVE DIAGNOSIS: RIGHT GROIN & THIGH ABSCESSES  POST-OPERATIVE DIAGNOSIS: RIGHT GROIN & THIGH  ABSCESSES   PROCEDURE: INCISION AND DRAINAGE RIGHT GROIN AND THIGH ABSCESSES DEBRIDEMENT  SURGEON: [name], MD  ASSISTANT: OR Staff  ANESTHESIA: local and general  EBL: Total I/O In: 621 [I.V.:913] Out: 20 [Blood:20]  Delay start of Pharmacological VTE agent (>24hrs) due to surgical blood loss or risk of bleeding: no  DRAINS: none  SPECIMEN: Right groin and thigh skin, dermis, subcutaneous tissue, fascia for culture and pathology  DISPOSITION OF SPECIMEN: MICROBIOLOGY & PATHOLOGY  COUNTS: YES  PLAN OF CARE: Admit to inpatient  PATIENT DISPOSITION: PACU - hemodynamically stable.  INDICATION: Former smoking female with right groin masses requiring numerous operations. S/p excision of cyst in right groin 01/2016. Pathology c/w lymphagioma & benign LN s/p lap TEP excision of inner pelvic mass, bilateral femoral hernia repair, left inguinal hernia repair 01/2018. Pathology c/w synovial cyst s/p excision proximal thigh mass 06/2018. Pathology c/w syniovial cyst & benign LN Incision and drainage of small seroma right lower groin incision 09/20/2018 Right groin and inner thigh exploration with excision of subcutaneous and lymph node 11/02/2018  Development of chronic wound infection. Growing atypical Mycobacterium species. Followed by infectious disease. Progression of inflammation despite antibiotic regimen. Admitted for worsening cellulitis and pain. Recommendation made for operative incision and drainage of abscesses and debridement of chronically inflamed infected tissue The anatomy and physiology of skin abscesses was discussed. Pathophysiology of SQ abscess, possible progression to fasciitis & sepsis, etc discussed . I stressed good hygiene & wound care. Possible redebridement was discussed as well.  Possibility of recurrence was discussed. Risks, benefits, alternatives were discussed. I noted a good likelihood this will help address the problem. Risks of  anesthesia and other risks discussed. Questions answered. The patient is does wish to proceed.  OR FINDINGS: Swollen indurated right groin and inner thigh incisions with cellulitis and scant purulent drainage. Some tissue necrosis/infection.    Incision done in right groin and right thigh through skin, dermis, subcutaneous tissues, down to level of fascia. Excision with surgical scalpel and cautery.  Right groin wound 5 x 2 cm. Goes 2 cm deep.  Right thigh wound 7 x 3 cm with undermining. Goes 3 cm deep   DESCRIPTION:  Informed consent was confirmed. The patient remained on oral linezolid & clofazimine per infectious disease. The patient underwent general anesthesia without any difficulty. The patient was positioned supine. SCDs were active during the entire case. The area around the abscess was prepped and draped in a sterile fashion. A surgical timeout confirmed our plan.  I made an incision over the most fluctuant area of the right groin and right thigh inflamed former incisions. Purulent material expressed. Sent for aerobic and anaerobic culture. I placed my finger into the abscess cavity to break up loculations. The incision was extended to adequately expose the entire cavity. I extended the wound to expose the chronically inflamed necrotic and infected tissue. I used scalpel and scissors to excise necrotic and infected tissue down to the level of the anterior abdominal wall fascia on the right groin and the anterior inner fascia of the right thigh. We did irrigation. The fascia was viable. Excess skin excised to have nice open wounds. Right thigh wound undermined it cephalad for the central area of swelling not associated with incision. We took extra care to ensure hemostasis. Tissue was sent for culture and pathology. We called and discussed with microbiology to make sure that appropriate specimens were obtained and handled  The wound was packed with saline moistened gauze.  Sterile dressings applied. Patient is being extubated go to recovery room. Plan is to continue antibiotic regimen. I did do a field block of local anesthetic. We will begin dressing care tomorrow. Hopefully go home tomorrow. Per the patient's request, I did not call her spouse or any other family member.  [name], M.D., F.A.C.S. Gastrointestinal and Minimally Invasive Surgery [facility] [address], [place], [place] [address] [phone] Main / Paging     Pathology: Diagnosis Synovium, cyst, right groin - BENIGN CYST, COMPATIBLE WITH CLINICALLY STATED SYNOVIAL CYST. - BENIGN LYMPH NODE. Jaquita Folds MD Pathologist, Electronic Signature (Case signed 06/18/2018) Specimen Catherine [name] and Clinical Information Specimen(s) Obtained: Synovium, cyst, right groin Specimen Clinical Information right groin subcutaneous mass, 3 cm x 2 cm (kp) Thelonious Kauffmann Received in formalin are multiple irregular portions of fibroadipose tissue measuring 4.5 x 4.5 x 1.5 cm in aggregate. There is clear mucinous fluid present on the surface of the tissue. An intact cyst is not grossly identified. The cut surface shows an ill-defined firm tan-white nodular area measuring up to 1.8 cm. Sections are submitted in three cassettes. (GP:ah 06/17/18) Report signed out from the following location(s) Technical Component was performed at [facility] [place], [place], [place] [address]. CLIA #: [id number], Interpretation was performed at [facility] [place], [place], [place] [address]. CLIA #: [id number],  Marland Kitchen 06/16/2018  5:37 PM  PATIENT: Catherine [name] 73 y.o. female  Patient Care Team: Jonathon Jordan, MD as PCP - General (Family Medicine) Felecia Shelling, Nanine Means, MD as Consulting Physician (Neurology) Michael Boston, MD as Consulting Physician (General Surgery) Ronald Lobo, MD as Consulting Physician  (Gastroenterology)  PRE-OPERATIVE DIAGNOSIS: Right groin subcutaneous mass,  3 cm x 2 cm  POST-OPERATIVE DIAGNOSIS: Right groin subcutaneous masses, largest 4 cm x 3 x 3 cm  PROCEDURE: REMOVAL OF RIGHT GROIN / THIGH MASSES  SURGEON: [name], MD  ASSISTANT: RN  ANESTHESIA: Local anesthetic and field block in the subcutaneous tissues. No deep local anesthetic General  I&O Total I/O In: 965 [I.V.:915; IV Piggyback:50] Out: 10 [Blood:10]  Delay start of Pharmacological VTE agent (>24hrs) due to surgical blood loss or risk of bleeding: NO  DRAINS: none  SPECIMEN: No Specimen  DISPOSITION OF SPECIMEN: N/A  COUNTS: YES  PLAN OF CARE: Discharge to home after PACU  PATIENT DISPOSITION: PACU - hemodynamically stable.  INDICATION: Pleasant woman was right inguinal mass excised consistent with lymphocele. Recurrent pain and swelling and found to have a femoral hernia and femoral canal cystic mass. Excised. Consistent with a synovial cyst. Femoral hernia repaired. Initially did well. Then felt new swelling in her inner thigh away from her prior areas. CT scan showed fluid collection. Aspirated. Thick fluid concerning for recurrent synovial cyst. Mass recurred. Patient requested for groin exploration with possible excision.  The pathophysiology of skin & subcutaneous masses was discussed. Natural history risks without surgery were discussed. I recommended surgery to remove the mass. I explained the technique of removal with use of local anesthesia & possible need for more aggressive sedation/anesthesia for patient comfort.   Risks such as bleeding, infection, wound breakdown, heart attack, death, and other risks were discussed. I noted a good likelihood this will help address the problem. Possibility that this will not correct all symptoms was explained. Possibility of regrowth/recurrence of the mass was discussed. We will work to minimize complications.  Questions were answered. The patient expresses understanding & wishes to proceed with surgery.  OR FINDINGS: Simple cystic mass 4 x 3 x 3 cm on proximal inner right thigh consistent with recurrent synovial cyst. Smaller firm nodules in a cluster heading towards the femoral foramen concerning for a small synovial cyst as well. Excised.  Blake drain left in place  DESCRIPTION:  The patient was identified & brought into the operating room. The patient was positioned supine with arms tucked. SCDs were active during the entire case. The patient underwent general anesthesia without any difficulty. The abdomen was prepped and draped in a sterile fashion. A Surgical Timeout confirmed our plan.  Made an oblique incision in the medial right groin just above the crease between the thigh region. Came to the subcutaneous tissues and the deep subcutaneous tissues going towards the right inner thigh was a cystic mass. Able to come around it. Was decompressed and had thick tan syrupy brown fluid within it. Concerning for recurrent synovial cyst. Skeletonized the more distal aspects on the proximal thigh and placed medium-size clips on it. Elevate the mass off the inner thigh muscular compartment and excised the larger mass. I could feel some small PE and small marble sized cystic masses with fibrosis heading towards the femoral ring underneath the inguinal ligament. Skeletonized this region carefully excised it. Placed clips just at the femoral foramen. Took care to avoid any vascular or nerve injury. Hemostasis was good.  I did copious irrigation over a liter and reinspection. Remaining tissues were soft with no evidence of any persistent cystic masses or other areas of concern. Most compartments intact. Hemostasis good. No evidence of any neurovascular injury. Resulting 10 x 5 x 4 cm wound. Side to place a 15 [place] Blake drain from the right lower quadrant through the subcutaneous  tissues into  this inguinal/proximal thigh pocket. The skin with 2-0 Prolene suture. Closed with 3-0 Vicryl interrupted sutures at Scarpa's and 4 Monocryl suture at the deep dermal subcuticular running fashion. Dermabond placed.  Patient extubated in recovery room. Talkative. Has some soreness but moving extremities normally. I discussed operative findings, updated the patient's status, discussed probable steps to recovery, and gave postoperative recommendations to the patient's spouse. Recommendations were made. Questions were answered. He expressed understanding & appreciation. I had discussed postoperative care with the patient in the holding area.   [name], M.D., F.A.C.S. Gastrointestinal and Minimally Invasive Surgery [facility] 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)  VISIT FOR WOUND CHECK (Z51.89)  CHANGE OR REMOVAL OF DRAINS (Z48.03)  GROIN PAIN, CHRONIC, RIGHT (R10.31)  RIGHT INGUINAL HERNIA (K40.90)  PREOP - ING HERNIA - ENCOUNTER FOR PREOPERATIVE EXAMINATION FOR GENERAL SURGICAL PROCEDURE (Z01.818)  OTHER GANGLION AND CYST OF SYNOVIUM, TENDON, AND BURSA (M67.49, M67.89,M71.39)  BILATERAL FEMORAL HERNIA WITHOUT OBSTRUCTION OR GANGRENE, RECURRENCE NOT SPECIFIED (K41.20)  MASS OF THIGH, RIGHT (R22.41)  ACID-FAST BACTERIA PRESENT (R89.9)   Allergies (Sabrina Canty, CMA; 02/14/2019 1:30 PM) Penicillin G Benzathine & Proc *PENICILLINS*  Rash Sulfabenzamide *CHEMICALS*  rash Cipro *FLUOROQUINOLONES*  Allergies Reconciled   Medication History (Sabrina Canty, CMA; 02/14/2019 1:31 PM) Gabapentin (100MG   Capsule, 1 (one) Capsule Oral two times daily, as needed, Taken starting 09/13/2018) Active. (Start 100mg  twice a day - can increase gradually to 300mg  3x/day) Linezolid (600MG  Tablet, Oral) Active. Clofazimine (50MG  Capsule, Oral) Active. Prolia (60MG /ML Soln Pref Syr, Subcutaneous) Active. Calcium Carbonate (Oral) Specific strength unknown - Active. Vitamin B-12 (5000MCG Tablet Disint, Oral) Active. Cetirizine HCl (5MG  Tablet, Oral) Active. Flonase (50MCG/ACT Suspension, Nasal) Active. Ibuprofen (Oral) Specific strength unknown - Active. LORazepam (1MG  Tablet, Oral) Active. DiltiaZEM HCl ER Beads (360MG  Capsule ER 24HR, Oral) Active. Symbicort (160-4.5MCG/ACT Aerosol, Inhalation) Active. Albuterol Sulfate ((2.5 MG/3ML)0.083% Nebulized Soln, Inhalation) Active. Sucralfate (1GM Tablet, Oral) Active. NexIUM (40MG  Capsule DR, Oral) Active. Vitamin D (Cholecalciferol) (1000UNIT Capsule, Oral) Active. Medications Reconciled  Vitals (Sabrina Canty CMA; 02/14/2019 1:31 PM) 02/14/2019 1:31 PM Weight: 165 lb Height: 70.5in Body Surface Area: 1.93 m Body Mass Index: 23.34 kg/m  Temp.: 97.60F (Oral)  Pulse: 111 (Regular)  BP: 128/62(Sitting, Left Arm, Standard)       Physical Exam [name] MD; 02/14/2019 2:03 PM) General Mental Status-Alert. General Appearance-Not in acute distress. Voice-Normal.  Integumentary Global Assessment Normal Exam - Distribution of scalp and body hair is normal. General Characteristics Overall Skin Surface - no rashes and no suspicious lesions.  Head and Neck Head-normocephalic, atraumatic with no lesions or palpable masses. Face Global Assessment - atraumatic, no absence of expression. Neck Global Assessment - no abnormal movements, no decreased range of motion. Trachea-midline. Thyroid Gland Characteristics - non-tender.  Eye Eyeball - Left-Extraocular movements intact, No Nystagmus - Left. Eyeball  - Right-Extraocular movements intact, No Nystagmus - Right. Upper Eyelid - Left-No Cyanotic - Left. Upper Eyelid - Right-No Cyanotic - Right.  Chest and Lung Exam Inspection Accessory muscles - No use of accessory muscles in breathing.  Female Genitourinary Note: Right lower groin incision closed down to with some swelling. No frank drainage. Concerning  for subcutaneous nodularity/infection  Right inner thigh incision closed down with no erythema or medial swelling. Some lateral subcutaneous nodularity.  No more pain. No cellulitis. Not fully close down and a couple small subcutaneous nodules in the region but overall improved   Peripheral Vascular Upper Extremity Inspection - Not Gangrenous, No Petechiae. Inspection - Not Gangrenous, No Petechiae.  Neurologic Neurologic evaluation reveals -normal attention span and ability to concentrate, able to name objects and repeat phrases. Appropriate fund of knowledge and normal coordination.  Neuropsychiatric Mental status exam performed with findings of-able to articulate well with normal speech/language, rate, volume and coherence and no evidence of hallucinations, delusions, obsessions or homicidal/suicidal ideation. Orientation-oriented X3.  Musculoskeletal Global Assessment Gait and Station - normal gait and station.  Lymphatic General Lymphatics Description - No Generalized lymphadenopathy.    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  Concerned that infection will not clear without mesh removal. Second opinion at [facility] concurs as well. Patient with recurrent swelling and nodularity off antibiotics.  Discussed at length with her. When plan laparoscopic preperitoneal approach to remove the preperitoneal mesh. Initially was  thinking of replacing with absorbable Phasix mesh, but I think I would remove and not replace any mesh at this point. Right groin excisional debridement of any nodularity with open wounds. Reculture. Then plan antibiotics per infectious disease.  I believe they are leaning more to reconsidering fluoroquinolone and monitoring her small but stable aortic aneurysm closely. Dr. Cyndia Bent follows her aneurysm & is aware.  Current Plans You are being scheduled for surgery- Our schedulers will call you.  You should hear from our office's scheduling department within 5 working days about the location, date, and time of surgery. We try to make accommodations for patient's preferences in scheduling surgery, but sometimes the OR schedule or the surgeon's schedule prevents [place] from making those accommodations.  If you have not heard from our office [phone]) in 5 working days, call the office and ask for your surgeon's nurse.  If you have other questions about your diagnosis, plan, or surgery, call the office and ask for your surgeon's nurse.  Written instructions provided Pt Education - [facility] Pain Control (Ivan Maskell) Pt Education - [facility] Hernia Post-Op HCI (Prestyn Mahn): discussed with patient and provided information. The anatomy & physiology of the abdominal wall and pelvic floor was discussed. The pathophysiology of hernias and infection in the inguinal and pelvic region was discussed. Natural history risks such as progressive enlargement, pain, incarceration, and strangulation was discussed. Contributors to complications such as smoking, obesity, diabetes, prior surgery, etc were discussed.  I feel the risks of no intervention will lead to serious problems that outweigh the operative risks; therefore, I recommended surgery to remove mesh and debridement the groin to remove chronic infection. I explained laparoscopic techniques with possible need for an open approach. I noted usual use of mesh to patch and/or  buttress hernia repair  Risks such as bleeding, infection, abscess, need for further treatment, heart attack, major artery or vascular injury, lymphedema, nerve injury or numbness , death, and other risks were discussed. I noted a good likelihood this will help address the problem. Goals of post-operative recovery were discussed as well. Possibility that this will not correct all symptoms was explained. I stressed the importance of low-impact activity, aggressive pain control, avoiding constipation, & not pushing through pain to  minimize risk of post-operative chronic pain or injury. Possibility of reherniation was discussed. We will work to minimize complications.  An educational handout further explaining the pathology & treatment options was given as well. Questions were answered. The patient expresses understanding & wishes to proceed with surgery.

## 2019-02-16 ENCOUNTER — Telehealth: Payer: Self-pay | Admitting: *Deleted

## 2019-02-16 MED ORDER — MOXIFLOXACIN HCL 400 MG PO TABS: 400.0000 mg | ORAL_TABLET | Freq: Every day | ORAL | 5 refills | Status: DC

## 2019-02-16 NOTE — Telephone Encounter (Signed)
Medication sent to pharmacy and patient notified and she understands not to start taking until AFTER her surgery.

## 2019-02-16 NOTE — Telephone Encounter (Signed)
-----   Message from Truman Hayward, MD sent at 02/16/2019  2:03 PM EDT ----- She would start on moxifloxacin 400mg  daily + clofazamine (she has this from [facility]) AFTER surgery and not before so we can get new cultures ----- Message ----- From: Reggy Eye, CMA Sent: 02/16/2019   9:58 AM EDT To: Truman Hayward, MD  MRI has been canceled and she wants to no if she should have new antibiotic on hand for day after surgery to start taking 02/28/19. She mentioned Moxifloxacin. Please advise if this is what you want to start her on.  Darnelle Maffucci ----- Message ----- From: Tommy Medal, Lavell Islam, MD Sent: 02/14/2019  12:14 PM EDT To: Reggy Eye, CMA  Darnelle Maffucci lets make sure MRI on hold Dr. Johney Maine texted me back that he I seeing pt today actually and will try to get the mesh out

## 2019-02-17 ENCOUNTER — Encounter (HOSPITAL_COMMUNITY): Payer: Self-pay

## 2019-02-17 ENCOUNTER — Ambulatory Visit (HOSPITAL_COMMUNITY): Payer: PPO

## 2019-02-23 NOTE — Progress Notes (Signed)
LOV DR BARTLE [facility] 01-05-19 Epic CHEST ANGIO CT AND 1 VIEW CHEST XRAY 01-18-19 Epic EKG 01-18-19 EPIC

## 2019-02-23 NOTE — Patient Instructions (Addendum)
YOU HAD  A COVID 19 TEST ON 02-24-2019. ONCE YOUR COVID TEST IS COMPLETED, PLEASE BEGIN THE QUARANTINE INSTRUCTIONS AS OUTLINED IN YOUR HANDOUT.                MARWAH DISBRO    Your procedure is scheduled on: 02-28-2019  Report to [facility] Main  Entrance    Report to admitting at  1:00 PM      Call this number if you have problems the morning of surgery [phone]      Remember: Do not eat food :After Midnight. CLEAR LIQUIDS UNTIL 900 AM. NOTHING BY MOUTH AFTER 900 AM.   BRUSH YOUR TEETH MORNING OF SURGERY AND RINSE YOUR MOUTH OUT, NO CHEWING GUM CANDY OR MINTS.     CLEAR LIQUID DIET   Foods Allowed                                                                     Foods Excluded  Coffee and tea, regular and decaf                             liquids that you cannot  Plain Jell-O any favor except red or purple                                           see through such as: Fruit ices (not with fruit pulp)                                     milk, soups, orange juice  Iced Popsicles                                    All solid food Carbonated beverages, regular and diet                                    Cranberry, grape and apple juices Sports drinks like Gatorade Lightly seasoned clear broth or consume(fat free) Sugar, honey syrup  Sample Menu Breakfast                                Lunch                                     Supper Cranberry juice                    Beef broth                            Chicken broth Jell-O  Grape juice                           Apple juice Coffee or tea                        Jell-O                                      Popsicle                                                Coffee or tea                        Coffee or tea  _____________________________________________________________________     Take these medicines the morning of surgery with A SIP OF WATER: TYLENOL, ALBUTEROL  INHALER (PLEASE BRING) , SYMBICORT INHALER, SPIRIVA HANDIHALER ,DILTIAZEM, NEXIUM, DILAUDID IF NEEDED, SUMATRIPTAN IF NEEDED,  TRAMADOL IF NEEDED                                You may not have any metal on your body including hair pins and              piercings  Do not wear jewelry, make-up, lotions, powders or perfumes, deodorant             Do not wear nail polish.  Do not shave  48 hours prior to surgery.                Do not bring valuables to the hospital. [place].  Contacts, dentures or bridgework may not be worn into surgery.  Leave suitcase in the car. After surgery it may be brought to your room.     _____________________________________________________________________             [facility] - Preparing for Surgery Before surgery, you can play an important role.  Because skin is not sterile, your skin needs to be as free of germs as possible.  You can reduce the number of germs on your skin by washing with CHG (chlorahexidine gluconate) soap before surgery.  CHG is an antiseptic cleaner which kills germs and bonds with the skin to continue killing germs even after washing. Please DO NOT use if you have an allergy to CHG or antibacterial soaps.  If your skin becomes reddened/irritated stop using the CHG and inform your nurse when you arrive at Short Stay. Do not shave (including legs and underarms) for at least 48 hours prior to the first CHG shower.  You may shave your face/neck. Please follow these instructions carefully:  1.  Shower with CHG Soap the night before surgery and the  morning of Surgery.  2.  If you choose to wash your hair, wash your hair first as usual with your  normal  shampoo.  3.  After you shampoo, rinse your hair and body thoroughly to remove the  shampoo.  4.  Use CHG as you would any other liquid soap.  You can apply chg directly  to the skin and wash                       Gently  with a scrungie or clean washcloth.  5.  Apply the CHG Soap to your body ONLY FROM THE NECK DOWN.   Do not use on face/ open                           Wound or open sores. Avoid contact with eyes, ears mouth and genitals (private parts).                       Wash face,  Genitals (private parts) with your normal soap.             6.  Wash thoroughly, paying special attention to the area where your surgery  will be performed.  7.  Thoroughly rinse your body with warm water from the neck down.  8.  DO NOT shower/wash with your normal soap after using and rinsing off  the CHG Soap.                9.  Pat yourself dry with a clean towel.            10.  Wear clean pajamas.            11.  Place clean sheets on your bed the night of your first shower and do not  sleep with pets. Day of Surgery : Do not apply any lotions/deodorants the morning of surgery.  Please wear clean clothes to the hospital/surgery center.  FAILURE TO FOLLOW THESE INSTRUCTIONS MAY RESULT IN THE CANCELLATION OF YOUR SURGERY PATIENT SIGNATURE_________________________________  NURSE SIGNATURE__________________________________  ________________________________________________________________________

## 2019-02-24 ENCOUNTER — Encounter (HOSPITAL_COMMUNITY)
Admission: RE | Admit: 2019-02-24 | Discharge: 2019-02-24 | Disposition: A | Discharge status: Home / Self Care | Payer: PPO | Source: Ambulatory Visit | Attending: Surgery | Admitting: Surgery

## 2019-02-24 ENCOUNTER — Other Ambulatory Visit: Payer: Self-pay

## 2019-02-24 ENCOUNTER — Other Ambulatory Visit (HOSPITAL_COMMUNITY)
Admission: RE | Admit: 2019-02-24 | Discharge: 2019-02-24 | Disposition: A | Discharge status: Home / Self Care | Payer: PPO | Source: Ambulatory Visit | Attending: Surgery | Admitting: Surgery

## 2019-02-24 ENCOUNTER — Encounter (HOSPITAL_COMMUNITY): Payer: Self-pay

## 2019-02-24 DIAGNOSIS — Z79899 Other long term (current) drug therapy: Secondary | ICD-10-CM | POA: Diagnosis not present

## 2019-02-24 DIAGNOSIS — J449 Chronic obstructive pulmonary disease, unspecified: Secondary | ICD-10-CM | POA: Insufficient documentation

## 2019-02-24 DIAGNOSIS — Z1159 Encounter for screening for other viral diseases: Secondary | ICD-10-CM | POA: Diagnosis not present

## 2019-02-24 DIAGNOSIS — Z01818 Encounter for other preprocedural examination: Secondary | ICD-10-CM | POA: Diagnosis not present

## 2019-02-24 DIAGNOSIS — I10 Essential (primary) hypertension: Secondary | ICD-10-CM | POA: Insufficient documentation

## 2019-02-24 DIAGNOSIS — F419 Anxiety disorder, unspecified: Secondary | ICD-10-CM | POA: Diagnosis not present

## 2019-02-24 DIAGNOSIS — I714 Abdominal aortic aneurysm, without rupture: Secondary | ICD-10-CM | POA: Diagnosis not present

## 2019-02-24 DIAGNOSIS — M810 Age-related osteoporosis without current pathological fracture: Secondary | ICD-10-CM | POA: Diagnosis not present

## 2019-02-24 DIAGNOSIS — A319 Mycobacterial infection, unspecified: Secondary | ICD-10-CM | POA: Insufficient documentation

## 2019-02-24 DIAGNOSIS — K219 Gastro-esophageal reflux disease without esophagitis: Secondary | ICD-10-CM | POA: Insufficient documentation

## 2019-02-24 DIAGNOSIS — Z87891 Personal history of nicotine dependence: Secondary | ICD-10-CM | POA: Insufficient documentation

## 2019-02-24 DIAGNOSIS — Z7951 Long term (current) use of inhaled steroids: Secondary | ICD-10-CM | POA: Diagnosis not present

## 2019-02-24 HISTORY — DX: Emphysema, unspecified: J43.9

## 2019-02-24 HISTORY — DX: Presence of external hearing-aid: Z97.4

## 2019-02-24 LAB — BASIC METABOLIC PANEL
Anion gap: 7 (ref 5–15)
BUN: 15 mg/dL (ref 8–23)
CO2: 24 mmol/L (ref 22–32)
Calcium: 9 mg/dL (ref 8.9–10.3)
Chloride: 107 mmol/L (ref 98–111)
Creatinine, Ser: 0.83 mg/dL (ref 0.44–1.00)
GFR calc Af Amer: >60 mL/min (ref >60)
GFR calc non Af Amer: >60 mL/min (ref >60)
Glucose, Bld: 98 mg/dL (ref 70–99)
Potassium: 4.2 mmol/L (ref 3.5–5.1)
Sodium: 138 mmol/L (ref 135–145)

## 2019-02-24 LAB — CBC
HCT: 41.4 % (ref 36.0–46.0)
Hemoglobin: 12.8 g/dL (ref 12.0–15.0)
MCH: 29.4 pg (ref 26.0–34.0)
MCHC: 30.9 g/dL (ref 30.0–36.0)
MCV: 95 fL (ref 80.0–100.0)
Platelets: 244 10*3/uL (ref 150–400)
RBC: 4.36 MIL/uL (ref 3.87–5.11)
RDW: 15 % (ref 11.5–15.5)
WBC: 7.1 10*3/uL (ref 4.0–10.5)
nRBC: 0 % (ref 0.0–0.2)

## 2019-02-24 LAB — SARS CORONAVIRUS 2 (TAT 6-24 HRS): SARS Coronavirus 2: NEGATIVE

## 2019-02-25 NOTE — Progress Notes (Signed)
Anesthesia Chart Review   Case: [id number] Date/Time: 02/28/19 1445   Procedures:      LAPAROSCOPIC EXPLORATION WITH REMOVAL OF RIGHT PREPERITONEAL MESH (Right )     GROIN EXPLORATION (N/A )   Anesthesia type: General   Pre-op diagnosis: RECURRENT RIGHT GROIN MYCOBACTERIAL INFECTION, HISTORY OF INGUINAL HERNIA REPAIR WITH MESH   Location: [facility]   Surgeon: Michael Boston, MD      DISCUSSION:73 y.o. former smoker (36 pack years, quit 09/04/17) with h/o HTN, COPD w/chronic cough, anxiety, TAA followed by Dr. Cyndia Bent, GERD, migraines, recurrent right groin infection with h/o inguinal hernia repair with mesh scheduled for above procedure 02/28/2019 with Dr. Michael Boston.   Pt last seen by vascular surgeon, Dr. Gilford Raid, 01/05/2019.  Stable 3.9 cm fusiform AAA.  2 year follow up recommended.    VS: BP 131/78   Pulse 82   Temp 37.3 C (Oral)   Resp 18   Ht 5\' 10"  (1.778 m)   Wt 75.5 kg   SpO2 99%   BMI 23.87 kg/m   PROVIDERS: Jonathon Jordan, MD is PCP   Gilford Raid, MD is Vascular Surgeon  LABS: Labs reviewed: Acceptable for surgery. (all labs ordered are listed, but only abnormal results are displayed)  Labs Reviewed  BASIC METABOLIC PANEL  CBC     IMAGES: CT Angio Chest 01/18/2019  IMPRESSION: 1.  Negative examination for pulmonary embolism.  2.  Mild emphysema.   EKG: 6/17/202 Rate 105 bpm Sinus tachycardia  Borderline T wave abnormalities  CV:  Past Medical History:  Diagnosis Date  . Anxiety   . Aortic aneurysm ([place]) 12/08/2018  . Arthritis    HANDS  . Chronic constipation   . Complication of anesthesia    upon waking up from anesthesia patient experienced chest pain - EKG done was negative, patient states "it may have been indigestion"  . COPD (chronic obstructive pulmonary disease) ([place])   . DDD (degenerative disc disease), lumbar    L2-3/3-4/4-5  . Dizziness 01/17/2019  . DOE (dyspnea on exertion) 01/17/2019  . Dysuria   . Frequency  of urination   . Ganglion cyst of right groin 11/23/2018  . GERD (gastroesophageal reflux disease)   . Hearing aid worn   . Hypertension   . Infected hernioplasty mesh ([place]) 01/26/2019  . Migraine   . Mild emphysema ([place])   . 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 Osteopenia 2019   TO OSTEOPOROSIS   . Pelvic pain in female   . Smokers' cough ([place])   . Thoracic aortic aneurysm (TAA) ([facility])    FOLLOWED BY DR BARTLE  LAST CT CHEST  WAS 6-3 SEE EPIC   . Urgency of urination    h/o INTERSTIAL CYSTITIS...DR. HUMPHRIES/GRAPEY  . Wears glasses     Past Surgical History:  Procedure Laterality Date  . ABDOMINAL HYSTERECTOMY  1982   W/  UNILATERAL SALPINGOOPHORECTOMY  . bilateral hernia surgery      removed ganglion cyst and swollen lymph node   . CHOLECYSTECTOMY OPEN  1987   W/  APPENDECTOMY  . COLONOSCOPY  2002  &  12-2012 & 03/19/15  . CYSTO WITH HYDRODISTENSION N/A 02/20/2014   Procedure: Orchid AND MARCAINE INSTILLATION;  Surgeon: Bernestine Amass, MD;  Location: [facility];  Service: Urology;  Laterality: N/A;  . EXCISION MASS LOWER EXTREMETIES Right 11/02/2018   Procedure: REMOVAL OF RIGHT GROIN AND THIGH SUBCUTANEOUS MASSES.;  Surgeon: Michael Boston, MD;  Location: [facility]  OR;  Service: General;  Laterality: Right;  . EYE SURGERY     bilateral cataract surgery   . INCISION AND DRAINAGE ABSCESS N/A 12/09/2018   Procedure: INCISION AND DRAINAGE RIGHT GROIN and thigh  WITH DEBRIDEMENT;  Surgeon: Michael Boston, MD;  Location: [facility];  Service: General;  Laterality: N/A;  . LAPAROSCOPIC UNILATERAL SALPINGOOPHORECTOMY  1984  . MASS EXCISION Right 06/16/2018   Procedure: REMOVAL OF RIGHT GROIN SUBCUTANEOUS MASS ERAS PATHWAY;  Surgeon: Michael Boston, MD;  Location: [facility];  Service: General;  Laterality: Right;  . removal of lymph node cyst in right groin     . ROTATOR CUFF REPAIR Right 2003   and BONE SPUR    MEDICATIONS: . acetaminophen  (TYLENOL) 500 MG tablet  . albuterol (PROVENTIL HFA;VENTOLIN HFA) 108 (90 Base) MCG/ACT inhaler  . AMBULATORY NON FORMULARY MEDICATION  . budesonide-formoterol (SYMBICORT) 160-4.5 MCG/ACT inhaler  . CALCIUM CARBONATE ANTACID PO  . carboxymethylcellulose (REFRESH PLUS) 0.5 % SOLN  . Carboxymethylcellulose Sodium (REFRESH LIQUIGEL) 1 % GEL  . Cholecalciferol (VITAMIN D3) 5000 units CAPS  . Cyanocobalamin (VITAMIN B-12 PO)  . denosumab (PROLIA) 60 MG/ML SOSY injection  . diltiazem (TAZTIA XT) 240 MG 24 hr capsule  . docusate sodium (COLACE) 100 MG capsule  . esomeprazole (NEXIUM) 20 MG capsule  . fluticasone (FLONASE) 50 MCG/ACT nasal spray  . gabapentin (NEURONTIN) 100 MG capsule  . HYDROmorphone (DILAUDID) 2 MG tablet  . linezolid (ZYVOX) 600 MG tablet  . LORazepam (ATIVAN) 1 MG tablet  . moxifloxacin (AVELOX) 400 MG tablet  . naproxen sodium (ALEVE) 220 MG tablet  . polyethylene glycol (MIRALAX / GLYCOLAX) packet  . SUMAtriptan (IMITREX) 50 MG tablet  . tiotropium (SPIRIVA HANDIHALER) 18 MCG inhalation capsule  . traMADol (ULTRAM) 50 MG tablet   No current facility-administered medications for this encounter.     Maia Plan [facility] Pre-Surgical Testing [phone] 02/25/19  11:29 AM

## 2019-02-25 NOTE — Anesthesia Preprocedure Evaluation (Addendum)
Anesthesia Evaluation  Patient identified by MRN, date of birth, ID band Patient awake    Reviewed: Allergy & Precautions, NPO status , Patient's Chart, lab work & pertinent test results  History of Anesthesia Complications (+) history of anesthetic complications  Airway Mallampati: II  TM Distance: >3 FB Neck ROM: Full    Dental no notable dental hx. (+) Teeth Intact   Pulmonary COPD,  COPD inhaler, former smoker,    Pulmonary exam normal breath sounds clear to auscultation       Cardiovascular hypertension, Pt. on medications + DOE  Normal cardiovascular exam Rhythm:Regular Rate:Normal     Neuro/Psych  Headaches, PSYCHIATRIC DISORDERS Anxiety Depression  Neuromuscular disease    GI/Hepatic Neg liver ROS, GERD  Medicated,  Endo/Other  negative endocrine ROS  Renal/GU negative Renal ROS   Interstitial cystitis    Musculoskeletal  (+) Arthritis , Osteoarthritis,  Right groin recurrent mycobacterial infection S/P inguinal herniorrhaphy with mesh   Abdominal   Peds  Hematology negative hematology ROS (+)   Anesthesia Other Findings   Reproductive/Obstetrics                            Anesthesia Physical Anesthesia Plan  ASA: III  Anesthesia Plan: General   Post-op Pain Management:    Induction: Intravenous, Cricoid pressure planned and Rapid sequence  PONV Risk Score and Plan:   Airway Management Planned: Oral ETT  Additional Equipment:   Intra-op Plan:   Post-operative Plan: Extubation in OR  Informed Consent: I have reviewed the patients History and Physical, chart, labs and discussed the procedure including the risks, benefits and alternatives for the proposed anesthesia with the patient or authorized representative who has indicated his/her understanding and acceptance.     Dental advisory given  Plan Discussed with: CRNA and Surgeon  Anesthesia Plan Comments: (See  PAT note 02/24/2019, Konrad Felix, PA-C)       Anesthesia Quick Evaluation

## 2019-02-27 MED ORDER — CLINDAMYCIN PHOSPHATE 900 MG/50ML IV SOLN: 900.0000 mg | INTRAVENOUS | Status: AC

## 2019-02-27 MED ORDER — GENTAMICIN SULFATE 40 MG/ML IJ SOLN
5.0000 mg/kg | INTRAVENOUS | Status: AC
  Administered 2019-02-28: 380 mg via INTRAVENOUS
  Filled 2019-02-27: qty 9.5

## 2019-02-27 MED ORDER — BUPIVACAINE LIPOSOME 1.3 % IJ SUSP
20.0000 mL | INTRAMUSCULAR | Status: DC
  Filled 2019-02-27: qty 20

## 2019-02-28 ENCOUNTER — Encounter (HOSPITAL_COMMUNITY)
Admission: RE | Disposition: A | Discharge status: Home / Self Care | Payer: Self-pay | Source: Home / Self Care | Attending: Surgery

## 2019-02-28 ENCOUNTER — Encounter (HOSPITAL_COMMUNITY): Payer: Self-pay

## 2019-02-28 ENCOUNTER — Inpatient Hospital Stay (HOSPITAL_COMMUNITY): Payer: PPO | Admitting: Physician Assistant

## 2019-02-28 ENCOUNTER — Inpatient Hospital Stay (HOSPITAL_COMMUNITY)
Admission: RE | Admit: 2019-02-28 | Discharge: 2019-03-02 | DRG: 571 | Disposition: A | Discharge status: Home / Self Care | Payer: PPO | Attending: Surgery | Admitting: Surgery

## 2019-02-28 ENCOUNTER — Inpatient Hospital Stay (HOSPITAL_COMMUNITY): Payer: PPO | Admitting: Certified Registered"

## 2019-02-28 DIAGNOSIS — J410 Simple chronic bronchitis: Secondary | ICD-10-CM | POA: Diagnosis present

## 2019-02-28 DIAGNOSIS — F418 Other specified anxiety disorders: Secondary | ICD-10-CM | POA: Diagnosis present

## 2019-02-28 DIAGNOSIS — J439 Emphysema, unspecified: Secondary | ICD-10-CM | POA: Diagnosis present

## 2019-02-28 DIAGNOSIS — Z7951 Long term (current) use of inhaled steroids: Secondary | ICD-10-CM | POA: Diagnosis not present

## 2019-02-28 DIAGNOSIS — T8140XA Infection following a procedure, unspecified, initial encounter: Secondary | ICD-10-CM | POA: Diagnosis not present

## 2019-02-28 DIAGNOSIS — Z880 Allergy status to penicillin: Secondary | ICD-10-CM

## 2019-02-28 DIAGNOSIS — Z885 Allergy status to narcotic agent status: Secondary | ICD-10-CM | POA: Diagnosis not present

## 2019-02-28 DIAGNOSIS — M19042 Primary osteoarthritis, left hand: Secondary | ICD-10-CM | POA: Diagnosis present

## 2019-02-28 DIAGNOSIS — Z4689 Encounter for fitting and adjustment of other specified devices: Secondary | ICD-10-CM | POA: Diagnosis not present

## 2019-02-28 DIAGNOSIS — I10 Essential (primary) hypertension: Secondary | ICD-10-CM | POA: Diagnosis present

## 2019-02-28 DIAGNOSIS — Y828 Other medical devices associated with adverse incidents: Secondary | ICD-10-CM | POA: Diagnosis present

## 2019-02-28 DIAGNOSIS — L02214 Cutaneous abscess of groin: Secondary | ICD-10-CM | POA: Diagnosis present

## 2019-02-28 DIAGNOSIS — Z882 Allergy status to sulfonamides status: Secondary | ICD-10-CM | POA: Diagnosis not present

## 2019-02-28 DIAGNOSIS — Z79899 Other long term (current) drug therapy: Secondary | ICD-10-CM

## 2019-02-28 DIAGNOSIS — N760 Acute vaginitis: Secondary | ICD-10-CM | POA: Diagnosis present

## 2019-02-28 DIAGNOSIS — M810 Age-related osteoporosis without current pathological fracture: Secondary | ICD-10-CM | POA: Diagnosis present

## 2019-02-28 DIAGNOSIS — K660 Peritoneal adhesions (postprocedural) (postinfection): Secondary | ICD-10-CM | POA: Diagnosis present

## 2019-02-28 DIAGNOSIS — L089 Local infection of the skin and subcutaneous tissue, unspecified: Secondary | ICD-10-CM | POA: Diagnosis present

## 2019-02-28 DIAGNOSIS — F419 Anxiety disorder, unspecified: Secondary | ICD-10-CM | POA: Diagnosis present

## 2019-02-28 DIAGNOSIS — I719 Aortic aneurysm of unspecified site, without rupture: Secondary | ICD-10-CM | POA: Diagnosis present

## 2019-02-28 DIAGNOSIS — Z881 Allergy status to other antibiotic agents status: Secondary | ICD-10-CM

## 2019-02-28 DIAGNOSIS — Z90710 Acquired absence of both cervix and uterus: Secondary | ICD-10-CM

## 2019-02-28 DIAGNOSIS — A318 Other mycobacterial infections: Secondary | ICD-10-CM | POA: Diagnosis present

## 2019-02-28 DIAGNOSIS — Z801 Family history of malignant neoplasm of trachea, bronchus and lung: Secondary | ICD-10-CM

## 2019-02-28 DIAGNOSIS — K219 Gastro-esophageal reflux disease without esophagitis: Secondary | ICD-10-CM | POA: Diagnosis present

## 2019-02-28 DIAGNOSIS — L049 Acute lymphadenitis, unspecified: Secondary | ICD-10-CM | POA: Diagnosis present

## 2019-02-28 DIAGNOSIS — K4090 Unilateral inguinal hernia, without obstruction or gangrene, not specified as recurrent: Secondary | ICD-10-CM | POA: Diagnosis present

## 2019-02-28 DIAGNOSIS — Z9049 Acquired absence of other specified parts of digestive tract: Secondary | ICD-10-CM

## 2019-02-28 DIAGNOSIS — L905 Scar conditions and fibrosis of skin: Secondary | ICD-10-CM | POA: Diagnosis present

## 2019-02-28 DIAGNOSIS — K5909 Other constipation: Secondary | ICD-10-CM | POA: Diagnosis present

## 2019-02-28 DIAGNOSIS — T83728A Exposure of other implanted mesh and other prosthetic materials to surrounding organ or tissue, initial encounter: Secondary | ICD-10-CM | POA: Diagnosis not present

## 2019-02-28 DIAGNOSIS — L929 Granulomatous disorder of the skin and subcutaneous tissue, unspecified: Secondary | ICD-10-CM | POA: Diagnosis not present

## 2019-02-28 DIAGNOSIS — Z800 Family history of malignant neoplasm of digestive organs: Secondary | ICD-10-CM

## 2019-02-28 DIAGNOSIS — M5136 Other intervertebral disc degeneration, lumbar region: Secondary | ICD-10-CM | POA: Diagnosis present

## 2019-02-28 DIAGNOSIS — M19041 Primary osteoarthritis, right hand: Secondary | ICD-10-CM | POA: Diagnosis present

## 2019-02-28 DIAGNOSIS — Z888 Allergy status to other drugs, medicaments and biological substances status: Secondary | ICD-10-CM

## 2019-02-28 DIAGNOSIS — I96 Gangrene, not elsewhere classified: Secondary | ICD-10-CM | POA: Diagnosis not present

## 2019-02-28 DIAGNOSIS — A319 Mycobacterial infection, unspecified: Secondary | ICD-10-CM | POA: Diagnosis not present

## 2019-02-28 HISTORY — PX: GROIN DISSECTION: SHX5250

## 2019-02-28 HISTORY — PX: LESION REMOVAL: SHX5196

## 2019-02-28 HISTORY — PX: LAPAROSCOPIC ABDOMINAL EXPLORATION: SHX6249

## 2019-02-28 HISTORY — PX: LAPAROSCOPIC LYSIS OF ADHESIONS: SHX5905

## 2019-02-28 SURGERY — EXPLORATION, ABDOMEN, LAPAROSCOPIC
Anesthesia: General | Site: Groin | Laterality: Right

## 2019-02-28 MED ORDER — LIDOCAINE 2% (20 MG/ML) 5 ML SYRINGE
INTRAMUSCULAR | Status: AC
  Filled 2019-02-28: qty 5

## 2019-02-28 MED ORDER — SODIUM CHLORIDE 0.9 % IR SOLN
Status: DC | PRN
  Administered 2019-02-28: 1000 mL via INTRAVESICAL

## 2019-02-28 MED ORDER — DOCUSATE SODIUM 100 MG PO CAPS
200.0000 mg | ORAL_CAPSULE | Freq: Every day | ORAL | Status: DC
  Administered 2019-03-01: 10:00:00 200 mg via ORAL
  Filled 2019-02-28: qty 2

## 2019-02-28 MED ORDER — ACETAMINOPHEN 500 MG PO TABS
1000.0000 mg | ORAL_TABLET | Freq: Three times a day (TID) | ORAL | Status: DC
  Administered 2019-03-01 – 2019-03-02 (×4): 1000 mg via ORAL
  Filled 2019-02-28 (×5): qty 2

## 2019-02-28 MED ORDER — LACTATED RINGERS IR SOLN
Status: DC | PRN
  Administered 2019-02-28: 3000 mL

## 2019-02-28 MED ORDER — ONDANSETRON HCL 4 MG/2ML IJ SOLN
4.0000 mg | Freq: Four times a day (QID) | INTRAMUSCULAR | Status: DC | PRN
  Administered 2019-03-01: 05:00:00 4 mg via INTRAVENOUS
  Filled 2019-02-28: qty 2

## 2019-02-28 MED ORDER — HYDROMORPHONE HCL 2 MG PO TABS: 2.0000 mg | ORAL_TABLET | Freq: Four times a day (QID) | ORAL | Status: DC | PRN

## 2019-02-28 MED ORDER — ACETAMINOPHEN 500 MG PO TABS: 1000.0000 mg | ORAL_TABLET | Freq: Two times a day (BID) | ORAL | Status: DC

## 2019-02-28 MED ORDER — UMECLIDINIUM BROMIDE 62.5 MCG/INH IN AEPB
1.0000 | INHALATION_SPRAY | Freq: Every day | RESPIRATORY_TRACT | Status: DC
  Administered 2019-03-02: 1 via RESPIRATORY_TRACT
  Filled 2019-02-28: qty 7

## 2019-02-28 MED ORDER — FLUTICASONE PROPIONATE 50 MCG/ACT NA SUSP
1.0000 | Freq: Every day | NASAL | Status: DC
  Administered 2019-03-01: 10:00:00 1 via NASAL
  Filled 2019-02-28: qty 16

## 2019-02-28 MED ORDER — LIDOCAINE 2% (20 MG/ML) 5 ML SYRINGE
INTRAMUSCULAR | Status: DC | PRN
  Administered 2019-02-28: 1 mg/kg/h via INTRAVENOUS

## 2019-02-28 MED ORDER — SODIUM CHLORIDE 0.9 % IV SOLN
INTRAVENOUS | Status: DC | PRN
  Administered 2019-02-28: 50 ug/min via INTRAVENOUS

## 2019-02-28 MED ORDER — SUCCINYLCHOLINE CHLORIDE 200 MG/10ML IV SOSY
PREFILLED_SYRINGE | INTRAVENOUS | Status: AC
  Filled 2019-02-28: qty 10

## 2019-02-28 MED ORDER — METHYLENE BLUE 0.5 % INJ SOLN
INTRAVENOUS | Status: AC
  Filled 2019-02-28: qty 10

## 2019-02-28 MED ORDER — SUGAMMADEX SODIUM 200 MG/2ML IV SOLN
INTRAVENOUS | Status: AC
  Filled 2019-02-28: qty 2

## 2019-02-28 MED ORDER — CARBOXYMETHYLCELLULOSE SODIUM 1 % OP GEL: 1.0000 [drp] | Freq: Every day | OPHTHALMIC | Status: DC

## 2019-02-28 MED ORDER — DENOSUMAB 60 MG/ML ~~LOC~~ SOSY: 60.0000 mg | PREFILLED_SYRINGE | SUBCUTANEOUS | Status: DC

## 2019-02-28 MED ORDER — ZOLPIDEM TARTRATE 5 MG PO TABS: 5.0000 mg | ORAL_TABLET | Freq: Every evening | ORAL | Status: DC | PRN

## 2019-02-28 MED ORDER — POLYETHYLENE GLYCOL 3350 17 G PO PACK
17.0000 g | PACK | Freq: Every day | ORAL | Status: DC
  Administered 2019-03-01: 10:00:00 17 g via ORAL
  Filled 2019-02-28: qty 1

## 2019-02-28 MED ORDER — FENTANYL CITRATE (PF) 250 MCG/5ML IJ SOLN
INTRAMUSCULAR | Status: AC
  Filled 2019-02-28: qty 5

## 2019-02-28 MED ORDER — POLYVINYL ALCOHOL 1.4 % OP SOLN
2.0000 [drp] | Freq: Three times a day (TID) | OPHTHALMIC | Status: DC
  Administered 2019-02-28 – 2019-03-01 (×3): 2 [drp] via OPHTHALMIC
  Filled 2019-02-28: qty 15

## 2019-02-28 MED ORDER — SUGAMMADEX SODIUM 200 MG/2ML IV SOLN
INTRAVENOUS | Status: DC | PRN
  Administered 2019-02-28: 150 mg via INTRAVENOUS

## 2019-02-28 MED ORDER — CLINDAMYCIN PHOSPHATE 900 MG/50ML IV SOLN
INTRAVENOUS | Status: DC | PRN
  Administered 2019-02-28: 900 mg via INTRAVENOUS

## 2019-02-28 MED ORDER — ONDANSETRON HCL 4 MG/2ML IJ SOLN: 4.0000 mg | Freq: Once | INTRAMUSCULAR | Status: DC | PRN

## 2019-02-28 MED ORDER — HYDROMORPHONE HCL 1 MG/ML IJ SOLN
INTRAMUSCULAR | Status: AC
  Filled 2019-02-28: qty 1

## 2019-02-28 MED ORDER — FENTANYL CITRATE (PF) 250 MCG/5ML IJ SOLN
INTRAMUSCULAR | Status: DC | PRN
  Administered 2019-02-28 (×7): 50 ug via INTRAVENOUS

## 2019-02-28 MED ORDER — GABAPENTIN 100 MG PO CAPS: 100.0000 mg | ORAL_CAPSULE | Freq: Every day | ORAL | Status: DC

## 2019-02-28 MED ORDER — VITAMIN D 25 MCG (1000 UNIT) PO TABS
5000.0000 [IU] | ORAL_TABLET | Freq: Every day | ORAL | Status: DC
  Administered 2019-03-01: 10:00:00 5000 [IU] via ORAL
  Filled 2019-02-28: qty 5

## 2019-02-28 MED ORDER — PHENYLEPHRINE HCL (PRESSORS) 10 MG/ML IV SOLN
INTRAVENOUS | Status: AC
  Filled 2019-02-28: qty 1

## 2019-02-28 MED ORDER — SUMATRIPTAN SUCCINATE 50 MG PO TABS
50.0000 mg | ORAL_TABLET | ORAL | Status: DC | PRN
  Filled 2019-02-28: qty 1

## 2019-02-28 MED ORDER — ONDANSETRON HCL 4 MG/2ML IJ SOLN
INTRAMUSCULAR | Status: AC
  Filled 2019-02-28: qty 2

## 2019-02-28 MED ORDER — LIDOCAINE HCL 2 % IJ SOLN
INTRAMUSCULAR | Status: AC
  Filled 2019-02-28: qty 20

## 2019-02-28 MED ORDER — 0.9 % SODIUM CHLORIDE (POUR BTL) OPTIME
TOPICAL | Status: DC | PRN
  Administered 2019-02-28: 18:00:00 1000 mL

## 2019-02-28 MED ORDER — GABAPENTIN 300 MG PO CAPS
300.0000 mg | ORAL_CAPSULE | Freq: Two times a day (BID) | ORAL | Status: DC
  Administered 2019-02-28 – 2019-03-01 (×2): 300 mg via ORAL
  Filled 2019-02-28 (×3): qty 1

## 2019-02-28 MED ORDER — METHOCARBAMOL 500 MG PO TABS: 750.0000 mg | ORAL_TABLET | Freq: Four times a day (QID) | ORAL | Status: DC | PRN

## 2019-02-28 MED ORDER — DEXAMETHASONE SODIUM PHOSPHATE 10 MG/ML IJ SOLN
INTRAMUSCULAR | Status: AC
  Filled 2019-02-28: qty 1

## 2019-02-28 MED ORDER — ONDANSETRON 4 MG PO TBDP: 4.0000 mg | ORAL_TABLET | Freq: Four times a day (QID) | ORAL | Status: DC | PRN

## 2019-02-28 MED ORDER — HYDROMORPHONE HCL 1 MG/ML IJ SOLN
0.5000 mg | INTRAMUSCULAR | Status: DC | PRN
  Administered 2019-02-28: 1 mg via INTRAVENOUS
  Filled 2019-02-28 (×2): qty 1

## 2019-02-28 MED ORDER — LACTATED RINGERS IV SOLN
INTRAVENOUS | Status: DC
  Administered 2019-02-28 (×2): via INTRAVENOUS

## 2019-02-28 MED ORDER — GABAPENTIN 300 MG PO CAPS
300.0000 mg | ORAL_CAPSULE | ORAL | Status: AC
  Administered 2019-02-28: 14:00:00 300 mg via ORAL
  Filled 2019-02-28: qty 1

## 2019-02-28 MED ORDER — STERILE WATER FOR IRRIGATION IR SOLN
Status: DC | PRN
  Administered 2019-02-28: 1000 mL

## 2019-02-28 MED ORDER — POLYETHYLENE GLYCOL 3350 17 G PO PACK: 17.0000 g | PACK | Freq: Every day | ORAL | Status: DC | PRN

## 2019-02-28 MED ORDER — SIMETHICONE 80 MG PO CHEW: 40.0000 mg | CHEWABLE_TABLET | Freq: Four times a day (QID) | ORAL | Status: DC | PRN

## 2019-02-28 MED ORDER — TRAMADOL HCL 50 MG PO TABS
50.0000 mg | ORAL_TABLET | Freq: Four times a day (QID) | ORAL | Status: DC | PRN
  Administered 2019-03-01: 50 mg via ORAL
  Filled 2019-02-28: qty 1

## 2019-02-28 MED ORDER — ROCURONIUM BROMIDE 10 MG/ML (PF) SYRINGE
PREFILLED_SYRINGE | INTRAVENOUS | Status: AC
  Filled 2019-02-28: qty 10

## 2019-02-28 MED ORDER — BUPIVACAINE-EPINEPHRINE (PF) 0.25% -1:200000 IJ SOLN
INTRAMUSCULAR | Status: AC
  Filled 2019-02-28: qty 60

## 2019-02-28 MED ORDER — ONDANSETRON HCL 4 MG/2ML IJ SOLN
INTRAMUSCULAR | Status: DC | PRN
  Administered 2019-02-28: 4 mg via INTRAVENOUS

## 2019-02-28 MED ORDER — CHLORHEXIDINE GLUCONATE CLOTH 2 % EX PADS: 6.0000 | MEDICATED_PAD | Freq: Once | CUTANEOUS | Status: DC

## 2019-02-28 MED ORDER — CLINDAMYCIN PHOSPHATE 900 MG/50ML IV SOLN
INTRAVENOUS | Status: AC
  Filled 2019-02-28: qty 50

## 2019-02-28 MED ORDER — PROCHLORPERAZINE MALEATE 10 MG PO TABS: 10.0000 mg | ORAL_TABLET | Freq: Four times a day (QID) | ORAL | Status: DC | PRN

## 2019-02-28 MED ORDER — LACTATED RINGERS IV SOLN: 1000.0000 mL | Freq: Three times a day (TID) | INTRAVENOUS | Status: DC | PRN

## 2019-02-28 MED ORDER — PROCHLORPERAZINE EDISYLATE 10 MG/2ML IJ SOLN: 5.0000 mg | Freq: Four times a day (QID) | INTRAMUSCULAR | Status: DC | PRN

## 2019-02-28 MED ORDER — ENOXAPARIN SODIUM 40 MG/0.4ML ~~LOC~~ SOLN
40.0000 mg | SUBCUTANEOUS | Status: DC
  Administered 2019-03-01: 10:00:00 40 mg via SUBCUTANEOUS
  Filled 2019-02-28: qty 0.4

## 2019-02-28 MED ORDER — ALBUTEROL SULFATE HFA 108 (90 BASE) MCG/ACT IN AERS: 2.0000 | INHALATION_SPRAY | RESPIRATORY_TRACT | Status: DC | PRN

## 2019-02-28 MED ORDER — HYDROMORPHONE HCL 1 MG/ML IJ SOLN
0.2500 mg | INTRAMUSCULAR | Status: DC | PRN
  Administered 2019-02-28 (×4): 0.5 mg via INTRAVENOUS

## 2019-02-28 MED ORDER — PANTOPRAZOLE SODIUM 40 MG PO TBEC
40.0000 mg | DELAYED_RELEASE_TABLET | Freq: Every day | ORAL | Status: DC
  Administered 2019-03-01: 10:00:00 40 mg via ORAL
  Filled 2019-02-28: qty 1

## 2019-02-28 MED ORDER — PROPOFOL 10 MG/ML IV BOLUS
INTRAVENOUS | Status: AC
  Filled 2019-02-28: qty 20

## 2019-02-28 MED ORDER — LIDOCAINE 2% (20 MG/ML) 5 ML SYRINGE
INTRAMUSCULAR | Status: DC | PRN
  Administered 2019-02-28: 80 mg via INTRAVENOUS

## 2019-02-28 MED ORDER — EPHEDRINE 5 MG/ML INJ
INTRAVENOUS | Status: AC
  Filled 2019-02-28: qty 10

## 2019-02-28 MED ORDER — DIPHENHYDRAMINE HCL 12.5 MG/5ML PO ELIX: 12.5000 mg | ORAL_SOLUTION | Freq: Four times a day (QID) | ORAL | Status: DC | PRN

## 2019-02-28 MED ORDER — SODIUM CHLORIDE 0.9 % IV SOLN
INTRAVENOUS | Status: DC
  Administered 2019-02-28: via INTRAVENOUS

## 2019-02-28 MED ORDER — BISACODYL 10 MG RE SUPP: 10.0000 mg | Freq: Every day | RECTAL | Status: DC | PRN

## 2019-02-28 MED ORDER — BUPIVACAINE LIPOSOME 1.3 % IJ SUSP
INTRAMUSCULAR | Status: DC | PRN
  Administered 2019-02-28: 20 mL

## 2019-02-28 MED ORDER — LORAZEPAM 0.5 MG PO TABS
0.5000 mg | ORAL_TABLET | Freq: Every day | ORAL | Status: DC
  Administered 2019-03-01: 0.5 mg via ORAL
  Filled 2019-02-28: qty 1

## 2019-02-28 MED ORDER — ROCURONIUM BROMIDE 10 MG/ML (PF) SYRINGE
PREFILLED_SYRINGE | INTRAVENOUS | Status: DC | PRN
  Administered 2019-02-28: 10 mg via INTRAVENOUS
  Administered 2019-02-28: 20 mg via INTRAVENOUS
  Administered 2019-02-28: 10 mg via INTRAVENOUS
  Administered 2019-02-28: 50 mg via INTRAVENOUS

## 2019-02-28 MED ORDER — GABAPENTIN 300 MG PO CAPS: 300.0000 mg | ORAL_CAPSULE | ORAL | Status: DC

## 2019-02-28 MED ORDER — ACETAMINOPHEN 500 MG PO TABS: 1000.0000 mg | ORAL_TABLET | ORAL | Status: DC

## 2019-02-28 MED ORDER — DILTIAZEM HCL ER COATED BEADS 240 MG PO CP24
240.0000 mg | ORAL_CAPSULE | Freq: Every day | ORAL | Status: DC
  Administered 2019-03-01: 10:00:00 240 mg via ORAL
  Filled 2019-02-28: qty 1

## 2019-02-28 MED ORDER — PROPOFOL 10 MG/ML IV BOLUS
INTRAVENOUS | Status: DC | PRN
  Administered 2019-02-28: 150 mg via INTRAVENOUS

## 2019-02-28 MED ORDER — DIPHENHYDRAMINE HCL 50 MG/ML IJ SOLN: 12.5000 mg | Freq: Four times a day (QID) | INTRAMUSCULAR | Status: DC | PRN

## 2019-02-28 MED ORDER — SUCCINYLCHOLINE CHLORIDE 200 MG/10ML IV SOSY
PREFILLED_SYRINGE | INTRAVENOUS | Status: DC | PRN
  Administered 2019-02-28: 120 mg via INTRAVENOUS

## 2019-02-28 MED ORDER — STERILE WATER FOR IRRIGATION IR SOLN
Status: DC | PRN
  Administered 2019-02-28: 20:00:00 1 mL

## 2019-02-28 MED ORDER — MOMETASONE FURO-FORMOTEROL FUM 200-5 MCG/ACT IN AERO
2.0000 | INHALATION_SPRAY | Freq: Two times a day (BID) | RESPIRATORY_TRACT | Status: DC
  Administered 2019-03-01 – 2019-03-02 (×2): 2 via RESPIRATORY_TRACT
  Filled 2019-02-28: qty 8.8

## 2019-02-28 MED ORDER — LORAZEPAM 1 MG PO TABS
1.0000 mg | ORAL_TABLET | Freq: Every day | ORAL | Status: DC
  Administered 2019-02-28 – 2019-03-01 (×2): 1 mg via ORAL
  Filled 2019-02-28 (×2): qty 1

## 2019-02-28 MED ORDER — HYDRALAZINE HCL 20 MG/ML IJ SOLN: 5.0000 mg | INTRAMUSCULAR | Status: DC | PRN

## 2019-02-28 MED ORDER — ALBUTEROL SULFATE (2.5 MG/3ML) 0.083% IN NEBU: 2.5000 mg | INHALATION_SOLUTION | RESPIRATORY_TRACT | Status: DC | PRN

## 2019-02-28 MED ORDER — DEXAMETHASONE SODIUM PHOSPHATE 10 MG/ML IJ SOLN
INTRAMUSCULAR | Status: DC | PRN
  Administered 2019-02-28: 10 mg via INTRAVENOUS

## 2019-02-28 MED ORDER — EPHEDRINE SULFATE-NACL 50-0.9 MG/10ML-% IV SOSY
PREFILLED_SYRINGE | INTRAVENOUS | Status: DC | PRN
  Administered 2019-02-28: 10 mg via INTRAVENOUS

## 2019-02-28 MED ORDER — SOD CITRATE-CITRIC ACID 500-334 MG/5ML PO SOLN
30.0000 mL | Freq: Once | ORAL | Status: AC
  Administered 2019-02-28: 30 mL via ORAL
  Filled 2019-02-28: qty 30

## 2019-02-28 MED ORDER — MEPERIDINE HCL 50 MG/ML IJ SOLN: 6.2500 mg | INTRAMUSCULAR | Status: DC | PRN

## 2019-02-28 MED ORDER — ACETAMINOPHEN 500 MG PO TABS
1000.0000 mg | ORAL_TABLET | ORAL | Status: AC
  Administered 2019-02-28: 1000 mg via ORAL
  Filled 2019-02-28: qty 2

## 2019-02-28 MED ORDER — LIDOCAINE 20MG/ML (2%) 15 ML SYRINGE OPTIME
INTRAMUSCULAR | Status: DC | PRN
  Administered 2019-02-28: 1 mg/kg/h via INTRAVENOUS

## 2019-02-28 MED ORDER — FENTANYL CITRATE (PF) 100 MCG/2ML IJ SOLN
INTRAMUSCULAR | Status: AC
  Filled 2019-02-28: qty 2

## 2019-02-28 SURGICAL SUPPLY — 58 items
APL PRP STRL LF DISP 70% ISPRP (MISCELLANEOUS) ×4
APPLIER CLIP 5 13 M/L LIGAMAX5 (MISCELLANEOUS)
APR CLP MED LRG 5 ANG JAW (MISCELLANEOUS)
BINDER ABDOMINAL 12 ML 46-62 (SOFTGOODS) IMPLANT
CABLE HIGH FREQUENCY MONO STRZ (ELECTRODE) ×5 IMPLANT
CHLORAPREP W/TINT 26 (MISCELLANEOUS) ×5 IMPLANT
CLIP APPLIE 5 13 M/L LIGAMAX5 (MISCELLANEOUS) IMPLANT
CONT SPEC 4OZ CLIKSEAL STRL BL (MISCELLANEOUS) ×1 IMPLANT
COVER SURGICAL LIGHT HANDLE (MISCELLANEOUS) ×5 IMPLANT
COVER WAND RF STERILE (DRAPES) ×5 IMPLANT
DECANTER SPIKE VIAL GLASS SM (MISCELLANEOUS) ×4 IMPLANT
DEVICE SECURE STRAP 25 ABSORB (INSTRUMENTS) IMPLANT
DEVICE TROCAR PUNCTURE CLOSURE (ENDOMECHANICALS) ×4 IMPLANT
DRAPE WARM FLUID 44X44 (DRAPES) ×5 IMPLANT
DRSG TEGADERM 2-3/8X2-3/4 SM (GAUZE/BANDAGES/DRESSINGS) ×13 IMPLANT
DRSG TEGADERM 4X4.75 (GAUZE/BANDAGES/DRESSINGS) ×5 IMPLANT
ELECT REM PT RETURN 15FT ADLT (MISCELLANEOUS) ×5 IMPLANT
FILTER STRAW (MISCELLANEOUS) ×1 IMPLANT
GAUZE SPONGE 2X2 8PLY STRL LF (GAUZE/BANDAGES/DRESSINGS) IMPLANT
GAUZE SPONGE 4X4 12PLY STRL (GAUZE/BANDAGES/DRESSINGS) ×1 IMPLANT
GLOVE BIOGEL PI IND STRL 7.5 (GLOVE) IMPLANT
GLOVE BIOGEL PI INDICATOR 7.5 (GLOVE) ×2
GLOVE ECLIPSE 8.0 STRL XLNG CF (GLOVE) ×6 IMPLANT
GLOVE INDICATOR 8.0 STRL GRN (GLOVE) ×6 IMPLANT
GOWN SRG 2XL LVL 4 BRTHBL STRL (GOWNS) IMPLANT
GOWN STRL NON-REIN 2XL LVL4 (GOWNS) ×5
GOWN STRL REUS W/TWL XL LVL3 (GOWN DISPOSABLE) ×10 IMPLANT
IRRIG SUCT STRYKERFLOW 2 WTIP (MISCELLANEOUS) ×5
IRRIGATION SUCT STRKRFLW 2 WTP (MISCELLANEOUS) IMPLANT
KIT BASIN OR (CUSTOM PROCEDURE TRAY) ×5 IMPLANT
KIT TURNOVER KIT A (KITS) IMPLANT
MARKER SKIN DUAL TIP RULER LAB (MISCELLANEOUS) ×4 IMPLANT
NDL HYPO 21X1.5 SAFETY (NEEDLE) IMPLANT
NDL SPNL 22GX3.5 QUINCKE BK (NEEDLE) IMPLANT
NEEDLE HYPO 21X1.5 SAFETY (NEEDLE) ×5 IMPLANT
NEEDLE SPNL 22GX3.5 QUINCKE BK (NEEDLE) ×5 IMPLANT
PAD POSITIONING PINK XL (MISCELLANEOUS) ×5 IMPLANT
SCISSORS LAP 5X35 DISP (ENDOMECHANICALS) ×5 IMPLANT
SET TUBE SMOKE EVAC HIGH FLOW (TUBING) ×5 IMPLANT
SLEEVE ADV FIXATION 5X100MM (TROCAR) ×5 IMPLANT
SPONGE GAUZE 2X2 STER 10/PKG (GAUZE/BANDAGES/DRESSINGS) ×1
SUT MNCRL AB 4-0 PS2 18 (SUTURE) ×6 IMPLANT
SUT PDS AB 1 CT1 27 (SUTURE) ×9 IMPLANT
SUT PROLENE 1 CT 1 30 (SUTURE) ×20 IMPLANT
SUT VIC AB 2-0 SH 27 (SUTURE) ×10
SUT VIC AB 2-0 SH 27X BRD (SUTURE) IMPLANT
SUT VICRYL 0 UR6 27IN ABS (SUTURE) ×1 IMPLANT
SWAB COLLECTION DEVICE MRSA (MISCELLANEOUS) ×2 IMPLANT
SWAB CULTURE ESWAB REG 1ML (MISCELLANEOUS) ×2 IMPLANT
SYR CONTROL 10ML LL (SYRINGE) ×1 IMPLANT
TAPE CLOTH SURG 4X10 WHT LF (GAUZE/BANDAGES/DRESSINGS) ×1 IMPLANT
TOWEL OR 17X26 10 PK STRL BLUE (TOWEL DISPOSABLE) ×5 IMPLANT
TRAY FOLEY MTR SLVR 14FR STAT (SET/KITS/TRAYS/PACK) ×1 IMPLANT
TRAY LAPAROSCOPIC (CUSTOM PROCEDURE TRAY) ×5 IMPLANT
TROCAR ADV FIXATION 11X100MM (TROCAR) IMPLANT
TROCAR ADV FIXATION 5X100MM (TROCAR) ×5 IMPLANT
TROCAR BLADELESS OPT 5 100 (ENDOMECHANICALS) ×5 IMPLANT
TROCAR XCEL BLUNT TIP 100MML (ENDOMECHANICALS) ×1 IMPLANT

## 2019-02-28 NOTE — Anesthesia Procedure Notes (Signed)
Procedure Name: Intubation Date/Time: 02/28/2019 5:17 PM Performed by: Mitzie Na, CRNA Pre-anesthesia Checklist: Patient identified, Emergency Drugs available, Suction available, Patient being monitored and Timeout performed Patient Re-evaluated:Patient Re-evaluated prior to induction Oxygen Delivery Method: Circle system utilized Preoxygenation: Pre-oxygenation with 100% oxygen Induction Type: IV induction, Rapid sequence and Cricoid Pressure applied Laryngoscope Size: Mac and 3 Grade View: Grade I Tube type: Oral Tube size: 7.0 mm Number of attempts: 1 Airway Equipment and Method: Stylet Placement Confirmation: ETT inserted through vocal cords under direct vision and positive ETCO2 Secured at: 25 cm Tube secured with: Tape Dental Injury: Teeth and Oropharynx as per pre-operative assessment

## 2019-02-28 NOTE — Op Note (Signed)
02/28/2019  9:50 PM  PATIENT:  Catherine [name]  73 y.o. female  Patient Care Team: Jonathon Jordan, MD as PCP - General (Family Medicine) Felecia Shelling, Nanine Means, MD as Consulting Physician (Neurology) Michael Boston, MD as Consulting Physician (General Surgery) Ronald Lobo, MD as Consulting Physician (Gastroenterology) Tommy Medal, Lavell Islam, MD as Consulting Physician (Infectious Diseases) Jackelyn Knife, MD as Rounding Team (Internal Medicine) Lorenda Cahill [place] Constable, MD as Consulting Physician (Infectious Diseases)  PRE-OPERATIVE DIAGNOSIS:  RECURRENT RIGHT GROIN MYCOBACTERIAL INFECTION, HISTORY OF INGUINAL HERNIA REPAIR WITH MESH  POST-OPERATIVE DIAGNOSIS:   RECURRENT RIGHT GROIN MYCOBACTERIAL INFECTION HISTORY OF INGUINAL HERNIA REPAIR WITH MESH  PROCEDURE:   LAPAROSCOPIC EXPLORATION WITH REMOVAL OF RIGHT PREPERITONEAL MESH RIGHT GROIN EXPLORATION WITH RE-EXCISION OF GROIN ABSCESSES PRIMARY RIGHT INGUINAL HERNIA REPAIR LAPAROSCOPIC LYSIS OF ADHESIONS EXCISION RLQ ABDOMINAL WALL SCAR  SURGEON:  [name], MD  ASSISTANT: None  ANESTHESIA:     Regional ilioinguinal and genitofemoral and spermatic cord nerve blocks  General  EBL:  Total I/O In: 1000 [I.V.:1000] Out: 600 [Urine:350; Blood:250].  See anesthesia record  Delay start of Pharmacological VTE agent (>24hrs) due to surgical blood loss or risk of bleeding:  no  DRAINS: NONE  SPECIMEN:  Right preperitoneal ultra pro mesh.  Right lower quadrant dimpled scar, possible sinus tract Right groin and proximal thigh old scar with chronic abscesses  DISPOSITION OF SPECIMEN: Mesh and right groin abscesses sent for pathology as well as aerobic/anaerobic, fungal, TB/mycobacterial culture  COUNTS:  YES  PLAN OF CARE: Discharge to home after PACU  PATIENT DISPOSITION:  PACU - hemodynamically stable.  INDICATION: Patient with complicated medical history with recurrent right groin lesions with prior excisions including  lymphangioma, synovial cyst, chronic groin infections.  Mycobacterial.  Prior excisions with recurrence.  Infectious disease consultation.  Second opinion [employer].  Recommendation removed preperitoneal right groin mesh given the chronic draining wounds.  Technique was discussed at length with the patient.  Risks of nerve injury numbness decreased function hernia recurrence wound abscess recurrence etc. were discussed.  The anatomy & physiology of the abdominal wall and pelvic floor was discussed.  The pathophysiology of hernias in the inguinal and pelvic region was discussed.  Natural history risks such as progressive enlargement, pain, incarceration & strangulation was discussed.   Contributors to complications such as smoking, obesity, diabetes, prior surgery, etc were discussed.    Risks such as bleeding, infection, abscess, need for further treatment, heart attack, death, and other risks were discussed.  I noted a good likelihood this will help address the problem.   Goals of post-operative recovery were discussed as well.  Possibility that this will not correct all symptoms was explained.  I stressed the importance of low-impact activity, aggressive pain control, avoiding constipation, & not pushing through pain to minimize risk of post-operative chronic pain or injury. Possibility of reherniation was discussed.  We will work to minimize complications.     Questions were answered.  The patient expresses understanding & wishes to proceed with surgery.  OR FINDINGS: Very well incorporated preperitoneal mesh with no evidence of any purulence or infection in the preperitoneal space nor along the femoral or internal inguinal canals.  Not consistent with chronic mesh infection.  Sinus dimpled scar in right lower quadrant with fibrotic tract going down to fascia.  No stitch abscess or hernia recurrence.  No purulence or casing granulomas.  Wound closed.  Right groin oblique scars with yellow  caseating wounds/abscesses.  Widely excised down to  inguinal floor with probable inguinal hernia at the base after debridement.  Inguinal floor primarily closed.  Distal aspect of proximal inner thigh wound well-healed without caseating granulomas.  DESCRIPTION:  The patient was identified & brought into the operating room. The patient was positioned supine with arms tucked. SCDs were active during the entire case. The patient underwent general anesthesia without any difficulty.  The abdomen was prepped and draped in a sterile fashion. The patient's bladder was emptied.  A Surgical Timeout confirmed our plan.  I made a transverse incision through the inferior umbilical fold.  I made a small transverse nick through the anterior rectus fascia contralateral to the inguinal hernia side and placed a 0-vicryl stitch through the fascia.  I placed a Hasson trocar into the preperitoneal plane.  Entry was clean.  We induced carbon dioxide insufflation. Camera inspection revealed no injury.  I used a 30mm angled scope to bluntly free the peritoneum off the infraumbilical anterior abdominal wall.  I created enough of a preperitoneal pocket to place 95mm ports into the right & left mid-abdomen into this preperitoneal cavity.  I focused attention on the RIGHT pelvis since that was the dominant hernia side.   I used blunt & focused sharp dissection to free the peritoneum off the flank and down to the pubic rim.  Encountered dense inflammation consistent with the mesh.  Initially freed the Ultrapro mesh off the anterior abdominal wall and the oblique muscles on the right lateral aspect.  Came down to the pubic rim which was densely adherent and freed it off the inner pelvis.  I then began to lift the mesh off the peritoneum through focused blunt as well as cautery scissors dissection.  I was able to freed off the right anterior flank and psoas and come more medially.  Is able to freed off the anterior pelvis and bladder and  come laterally.  The most dense adhesions were to the iliac artery specially.  Eventually freed that off carefully using focus cautery scissors and sharp dissection.  Eventually was able to free off and remove the mesh pretty intact.  Did have to go into the peritoneum and a few spots to take peritoneum to get the mesh out.  On reinspection there was a patch of mesh still on the iliac artery more proximally.  Carefully freed this patch off until the iliac artery was well exposed with no mesh adhered to it.  Fortunately the adhesions to the iliac vein were soft with a fat layer, so I did not have to do any sharp dissection to free the mesh off it.  There was no giant inguinal or femoral hernia from the inside.  Mesh was removed out the umbilical port.  I did copious irrigation.  I assured hemostasis.  I had the circulator fill up the bladder with isotonic solution stained with methylene blue up to 400 mL.  Got good bladder distention without any evidence of any bladder leak or other abnormality the preperitoneal or intraperitoneal space.  I primarily closed the peritoneal defects in the right lower quadrant using 2-0 Vicryl intracorporeal laparoscopic suturing to good result.  Did copious irrigation few more liters and had good hemostasis.  I evacuated the carbon dioxide.  Ports were removed.  Infraumbilical fascia was primary closed using 0 Vicryl interrupted suture.  I closed the port [name] with 4-0 Monocryl suture at the 5 mm port [name].  Sterile gauze and dressing applied.  Next focused on the right lower quadrant groin.  There was a dimple diverting scar in the right lower quadrant region.  I made a transverse prior concave incision around it and came around sharply to find a scar/sinus tract going down to the fascia.  There was no stitch abscess or retained foreign body suture.  Sent that off for culture.  I encountered no abscess nor fat necrosis.  I primarily closed that wound with interrupted Monocryl  suture and sterile dressing.  Next I focused on the right groin.  The right groin scar was mostly closed down but there are a few punctate openings with yellow caseating thinly purulent cheesy material.  Also in the right inguinal/thigh crease.  The right proximal thigh scar was well-healed proximally.  I made a large bio concave oblique incision to excise most of the old scars and excised any subcutaneous nodularity and old scars down to the inguinal floor and scar tissue.  There was some laxity in the inguinal floor after sharp dissection and cautery.  I primarily closed it with 0 Vicryl in a running fashion to good result.  I dissected until there was nice soft normal subcutaneous tissues.  Resulting 10 x 5 cm wound had good granulation.  It was carefully packed.  The patient was extubated & arrived in the PACU in stable condition..  I had discussed postoperative care with the patient in the holding area.  Instructions are written in the chart.  I discussed operative findings, updated the patient's status, discussed probable steps to recovery, and gave postoperative recommendations to the patient's spouse.  Recommendations were made.  Questions were answered.  He expressed understanding & appreciation.   [name], M.D., F.A.C.S. Gastrointestinal and Minimally Invasive Surgery [facility] [address] [place], [place] [address] [phone] Main / Paging  02/28/2019 9:50 PM

## 2019-02-28 NOTE — Interval H&P Note (Signed)
History and Physical Interval Note:  02/28/2019 5:03 PM  Catherine [name]  has presented today for surgery, with the diagnosis of RECURRENT RIGHT GROIN MYCOBACTERIAL INFECTION, HISTORY OF INGUINAL HERNIA REPAIR WITH MESH.  The various methods of treatment have been discussed with the patient and family. After consideration of risks, benefits and other options for treatment, the patient has consented to  Procedure(s): LAPAROSCOPIC EXPLORATION WITH REMOVAL OF RIGHT PREPERITONEAL MESH (Right) GROIN EXPLORATION (N/A) as a surgical intervention.  The patient's history has been reviewed, patient examined, no change in status, stable for surgery.  I have reviewed the patient's chart and labs.  Questions were answered to the patient's satisfaction.    I have re-reviewed the the patient's records, history, medications, and allergies.  I have re-examined the patient.  I again discussed intraoperative plans and goals of post-operative recovery.  The patient agrees to proceed.  Catherine [name]  Jul 05, 1946 [id number]  Patient Care Team: Jonathon Jordan, MD as PCP - General (Family Medicine) Felecia Shelling, Nanine Means, MD as Consulting Physician (Neurology) Michael Boston, MD as Consulting Physician (General Surgery) Ronald Lobo, MD as Consulting Physician (Gastroenterology) Tommy Medal, Lavell Islam, MD as Consulting Physician (Infectious Diseases) Jackelyn Knife, MD as Rounding Team (Internal Medicine) Laurin Coder, MD as Consulting Physician (Infectious Diseases)  Patient Active Problem List   Diagnosis Date Noted  . Soft tissue infection - Mycobaterium fortuitum   12/08/2018    Priority: High  . Depression with anxiety 11/04/2016    Priority: High  . Infected hernioplasty mesh ([place]) 01/26/2019  . Dizziness 01/17/2019  . DOE (dyspnea on exertion) 01/17/2019  . Mycobacterium fortuitum infection   . Thigh abscess   . Aortic aneurysm ([place]) 12/08/2018  . Cellulitis of right groin 12/08/2018  . Ganglion  cyst of right groin 11/23/2018  . 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 Groin abscess 06/16/2018  . Anxiety   . Urgency of urination   . DDD (degenerative disc disease), lumbar   . Osteopenia 08/04/2017  . Right sided sciatica 02/18/2017  . Piriformis muscle pain 02/18/2017  . Right groin pain 11/04/2016  . Neuropathy 11/04/2016  . Numbness 11/04/2016  . Dysfunction of right eustachian tube 10/30/2015  . Presbycusis of both ears 10/30/2015  . Sensory hearing loss, bilateral 10/30/2015  . Subjective tinnitus of both ears 10/30/2015  . Atypical chest pain 05/08/2014    Past Medical History:  Diagnosis Date  . Anxiety   . Aortic aneurysm ([place]) 12/08/2018  . Arthritis    HANDS  . Chronic constipation   . Complication of anesthesia    upon waking up from anesthesia patient experienced chest pain - EKG done was negative, patient states "it may have been indigestion"  . COPD (chronic obstructive pulmonary disease) ([place])   . DDD (degenerative disc disease), lumbar    L2-3/3-4/4-5  . Dizziness 01/17/2019  . DOE (dyspnea on exertion) 01/17/2019  . Dysuria   . Frequency of urination   . Ganglion cyst of right groin 11/23/2018  . GERD (gastroesophageal reflux disease)   . Hearing aid worn   . Hypertension   . Infected hernioplasty mesh ([place]) 01/26/2019  . Migraine   . Mild emphysema ([place])   . 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 Osteopenia 2019   TO OSTEOPOROSIS   . Pelvic pain in female   . Smokers' cough ([place])   . Thoracic aortic aneurysm (TAA) ([facility])    FOLLOWED BY DR BARTLE  LAST CT CHEST  WAS 6-3 SEE EPIC   .  Urgency of urination    h/o INTERSTIAL CYSTITIS...DR. HUMPHRIES/GRAPEY  . Wears glasses     Past Surgical History:  Procedure Laterality Date  . ABDOMINAL HYSTERECTOMY  1982   W/  UNILATERAL SALPINGOOPHORECTOMY  . bilateral hernia surgery      removed ganglion cyst and swollen lymph node   . CHOLECYSTECTOMY OPEN  1987   W/  APPENDECTOMY  . COLONOSCOPY   2002  &  12-2012 & 03/19/15  . CYSTO WITH HYDRODISTENSION N/A 02/20/2014   Procedure: [place] AND MARCAINE INSTILLATION;  Surgeon: Bernestine Amass, MD;  Location: [facility];  Service: Urology;  Laterality: N/A;  . EXCISION MASS LOWER EXTREMETIES Right 11/02/2018   Procedure: REMOVAL OF RIGHT GROIN AND THIGH SUBCUTANEOUS MASSES.;  Surgeon: Michael Boston, MD;  Location: [facility];  Service: General;  Laterality: Right;  . EYE SURGERY     bilateral cataract surgery   . INCISION AND DRAINAGE ABSCESS N/A 12/09/2018   Procedure: INCISION AND DRAINAGE RIGHT GROIN and thigh  WITH DEBRIDEMENT;  Surgeon: Michael Boston, MD;  Location: [facility];  Service: General;  Laterality: N/A;  . LAPAROSCOPIC UNILATERAL SALPINGOOPHORECTOMY  1984  . MASS EXCISION Right 06/16/2018   Procedure: REMOVAL OF RIGHT GROIN SUBCUTANEOUS MASS ERAS PATHWAY;  Surgeon: Michael Boston, MD;  Location: [facility];  Service: General;  Laterality: Right;  . removal of lymph node cyst in right groin     . ROTATOR CUFF REPAIR Right 2003   and BONE SPUR    Social History   Socioeconomic History  . Marital status: Married    Spouse name: Not on file  . Number of children: Not on file  . Years of education: Not on file  . Highest education level: Not on file  Occupational History  . Not on file  Social Needs  . Financial resource strain: Not on file  . Food insecurity    Worry: Not on file    Inability: Not on file  . Transportation needs    Medical: Not on file    Non-medical: Not on file  Tobacco Use  . Smoking status: Former Smoker    Packs/day: 0.75    Years: 48.00    Pack years: 36.00    Types: Cigarettes    Quit date: 09/04/2017    Years since quitting: 1.4  . Smokeless tobacco: Never Used  Substance and Sexual Activity  . Alcohol use: No  . Drug use: No  . Sexual activity: Not on file  Lifestyle  . Physical activity    Days per week: Not on file    Minutes per session:  Not on file  . Stress: Not on file  Relationships  . Social Herbalist on phone: Not on file    Gets together: Not on file    Attends religious service: Not on file    Active member of club or organization: Not on file    Attends meetings of clubs or organizations: Not on file    Relationship status: Not on file  . Intimate partner violence    Fear of current or ex partner: Not on file    Emotionally abused: Not on file    Physically abused: Not on file    Forced sexual activity: Not on file  Other Topics Concern  . Not on file  Social History Narrative  . Not on file    Family History  Problem Relation Age of Onset  . Esophageal cancer  Mother 4  . Lung cancer Father 2    Medications Prior to Admission  Medication Sig Dispense Refill Last Dose  . acetaminophen (TYLENOL) 500 MG tablet Take 1,000 mg by mouth 2 (two) times a day.    02/27/2019 at Unknown time  . albuterol (PROVENTIL HFA;VENTOLIN HFA) 108 (90 Base) MCG/ACT inhaler Inhale 2 puffs into the lungs every 4 (four) hours as needed for wheezing or shortness of breath.   Past Month at Unknown time  . budesonide-formoterol (SYMBICORT) 160-4.5 MCG/ACT inhaler Inhale 2 puffs into the lungs 2 (two) times daily.   02/28/2019 at 0700  . CALCIUM CARBONATE ANTACID PO Take 1,000 mg by mouth daily.    02/27/2019 at Unknown time  . carboxymethylcellulose (REFRESH PLUS) 0.5 % SOLN Place 2 drops into both eyes 3 (three) times daily.    02/28/2019 at 0700  . Carboxymethylcellulose Sodium (REFRESH LIQUIGEL) 1 % GEL Place 1 drop into both eyes at bedtime.   02/27/2019 at Unknown time  . Cholecalciferol (VITAMIN D3) 5000 units CAPS Take 5,000 Units by mouth daily.    02/27/2019 at Unknown time  . Cyanocobalamin (VITAMIN B-12 PO) Take 2,000 mcg by mouth daily.    02/27/2019 at Unknown time  . diltiazem (TAZTIA XT) 240 MG 24 hr capsule Take 240 mg by mouth daily.    02/28/2019 at 0700  . docusate sodium (COLACE) 100 MG capsule Take 200 mg  by mouth daily.    02/26/2019  . esomeprazole (NEXIUM) 20 MG capsule Take 20 mg by mouth daily.   02/28/2019 at 0700  . fluticasone (FLONASE) 50 MCG/ACT nasal spray Place 1 spray into both nostrils daily.    02/27/2019 at Unknown time  . gabapentin (NEURONTIN) 100 MG capsule Take 100 mg by mouth at bedtime.    02/27/2019 at Unknown time  . LORazepam (ATIVAN) 1 MG tablet Take 0.5-1 mg by mouth See admin instructions. Take 0.5 mg by mouth in the morning and take 1 mg by mouth at bedtime   02/28/2019 at 1100  . moxifloxacin (AVELOX) 400 MG tablet Take 1 tablet (400 mg total) by mouth daily at 8 pm. 30 tablet 5 not started  . polyethylene glycol (MIRALAX / GLYCOLAX) packet Take 17 g by mouth daily.    02/27/2019 at Unknown time  . SUMAtriptan (IMITREX) 50 MG tablet Take 50 mg by mouth every 2 (two) hours as needed for migraine. May repeat in 2 hours if headache persists or recurs.   Past Month at Unknown time  . tiotropium (SPIRIVA HANDIHALER) 18 MCG inhalation capsule Place 18 mcg into inhaler and inhale daily.    02/28/2019 at 0700  . AMBULATORY NON FORMULARY MEDICATION Take 2 capsules by mouth daily. Medication Name: clofazimine 60 capsule 11 More than a month at Unknown time  . denosumab (PROLIA) 60 MG/ML SOSY injection Inject 60 mg into the skin every 6 (six) months.   More than a month at Unknown time  . HYDROmorphone (DILAUDID) 2 MG tablet Take 2 mg by mouth every 6 (six) hours as needed for severe pain.   More than a month at Unknown time  . linezolid (ZYVOX) 600 MG tablet Take 1 tablet (600 mg total) by mouth 2 (two) times daily. (Patient not taking: Reported on 01/18/2019) 60 tablet 5   . naproxen sodium (ALEVE) 220 MG tablet Take 2 tablets (440 mg total) by mouth every morning. (Patient not taking: Reported on 01/18/2019)     . traMADol (ULTRAM) 50 MG tablet Take 50  mg by mouth every 6 (six) hours as needed for moderate pain.   More than a month at Unknown time    Current Facility-Administered  Medications  Medication Dose Route Frequency Provider Last Rate Last Dose  . bupivacaine liposome (EXPAREL) 1.3 % injection 266 mg  20 mL Infiltration On Call to OR Michael Boston, MD      . Chlorhexidine Gluconate Cloth 2 % PADS 6 each  6 each Topical Once Michael Boston, MD       And  . Chlorhexidine Gluconate Cloth 2 % PADS 6 each  6 each Topical Once Michael Boston, MD      . clindamycin (CLEOCIN) 900 MG/50ML IVPB           . gentamicin (GARAMYCIN) 380 mg in dextrose 5 % 100 mL IVPB  5 mg/kg Intravenous On Call to OR Michael Boston, MD      . lactated ringers infusion   Intravenous Continuous Myrtie Soman, MD 10 mL/hr at 02/28/19 1340    . sodium citrate-citric acid (ORACIT) solution 30 mL  30 mL Oral Once Josephine Igo, MD         Allergies  Allergen Reactions  . Ciprofloxacin Hcl Other (See Comments)    Aortic aneurysm per MD  . Metoprolol Other (See Comments) and Cough    and wheezing  . Penicillins Shortness Of Breath, Rash and Other (See Comments)    Has patient had a PCN reaction causing immediate rash, facial/tongue/throat swelling, SOB or lightheadedness with hypotension: Yes Has patient had a PCN reaction causing severe rash involving mucus membranes or skin necrosis: No Has patient had a PCN reaction that required hospitalization: Yes - MD office Has patient had a PCN reaction occurring within the last 10 years: No If all of the above answers are "NO", then may proceed with Cephalosporin use.   Marland Kitchen Oxycodone Other (See Comments)    Nightmares.  Tolerates tramadol  . Advair Diskus [Fluticasone-Salmeterol] Anxiety  . Sulfa Antibiotics Rash    BP 132/69   Pulse 87   Temp 98.4 F (36.9 C) (Oral)   Resp 18   Ht 5\' 10"  (1.778 m)   Wt 75.5 kg   SpO2 99%   BMI 23.87 kg/m   Labs: No results found for this or any previous visit (from the past 48 hour(s)).  Imaging / Studies: No results found.   [name], M.D., F.A.C.S. Gastrointestinal and Minimally Invasive  Surgery [facility] [address], [place], [place] [address] [phone] Main / Paging  02/28/2019 5:03 PM    [name]

## 2019-02-28 NOTE — Anesthesia Postprocedure Evaluation (Signed)
Anesthesia Post Note  Patient: Catherine [name]  Procedure(s) Performed: LAPAROSCOPIC EXPLORATION WITH REMOVAL OF RIGHT PREPERITONEAL MESH (Right Abdomen) GROIN EXPLORATION AND DEBRIDEMENT (N/A Groin) LAPAROSCOPIC LYSIS OF ADHESIONS (Bilateral Abdomen) EXCISION VAGINAL ABSCESS  AND DISSECTION (Right Groin)     Patient location during evaluation: PACU Anesthesia Type: General Level of consciousness: awake and alert and oriented Pain management: pain level controlled Vital Signs Assessment: post-procedure vital signs reviewed and stable Respiratory status: spontaneous breathing, nonlabored ventilation, respiratory function stable and patient connected to nasal cannula oxygen Cardiovascular status: blood pressure returned to baseline and stable Postop Assessment: no apparent nausea or vomiting Anesthetic complications: no    Last Vitals:  Vitals:   02/28/19 2200 02/28/19 2215  BP: (!) 116/58 (!) 102/45  Pulse: 87 70  Resp: 16 13  Temp: 36.6 C   SpO2: 100% 100%    Last Pain:  Vitals:   02/28/19 2215  TempSrc:   PainSc: 8                  [name]

## 2019-02-28 NOTE — Transfer of Care (Signed)
Immediate Anesthesia Transfer of Care Note  Patient: Catherine [name]  Procedure(s) Performed: LAPAROSCOPIC EXPLORATION WITH REMOVAL OF RIGHT PREPERITONEAL MESH (Right Abdomen) GROIN EXPLORATION AND DEBRIDEMENT (N/A Groin) LAPAROSCOPIC LYSIS OF ADHESIONS (Bilateral Abdomen) EXCISION VAGINAL ABSCESS  AND DISSECTION (Right Groin)  Patient Location: PACU  Anesthesia Type:General  Level of Consciousness: awake, alert  and patient cooperative  Airway & Oxygen Therapy: Patient Spontanous Breathing and Patient connected to face mask oxygen  Post-op Assessment: Report given to RN and Post -op Vital signs reviewed and stable  Post vital signs: Reviewed and stable  Last Vitals:  Vitals Value Taken Time  BP 116/71 02/28/19 2154  Temp    Pulse 94 02/28/19 2157  Resp 17 02/28/19 2157  SpO2 100 % 02/28/19 2157  Vitals shown include unvalidated device data.  Last Pain:  Vitals:   02/28/19 1330  TempSrc:   PainSc: 2       Patients Stated Pain Goal: 2 (30/16/01 0932)  Complications: No apparent anesthesia complications

## 2019-03-01 ENCOUNTER — Encounter (HOSPITAL_COMMUNITY): Payer: Self-pay | Admitting: Surgery

## 2019-03-01 ENCOUNTER — Other Ambulatory Visit: Payer: Self-pay

## 2019-03-01 DIAGNOSIS — Z888 Allergy status to other drugs, medicaments and biological substances status: Secondary | ICD-10-CM | POA: Diagnosis not present

## 2019-03-01 DIAGNOSIS — J410 Simple chronic bronchitis: Secondary | ICD-10-CM | POA: Diagnosis present

## 2019-03-01 DIAGNOSIS — Z882 Allergy status to sulfonamides status: Secondary | ICD-10-CM | POA: Diagnosis not present

## 2019-03-01 DIAGNOSIS — A318 Other mycobacterial infections: Secondary | ICD-10-CM | POA: Diagnosis present

## 2019-03-01 DIAGNOSIS — Z880 Allergy status to penicillin: Secondary | ICD-10-CM | POA: Diagnosis not present

## 2019-03-01 DIAGNOSIS — J439 Emphysema, unspecified: Secondary | ICD-10-CM | POA: Diagnosis present

## 2019-03-01 DIAGNOSIS — M810 Age-related osteoporosis without current pathological fracture: Secondary | ICD-10-CM | POA: Diagnosis present

## 2019-03-01 DIAGNOSIS — Z885 Allergy status to narcotic agent status: Secondary | ICD-10-CM | POA: Diagnosis not present

## 2019-03-01 DIAGNOSIS — K5909 Other constipation: Secondary | ICD-10-CM | POA: Diagnosis present

## 2019-03-01 DIAGNOSIS — K660 Peritoneal adhesions (postprocedural) (postinfection): Secondary | ICD-10-CM | POA: Diagnosis present

## 2019-03-01 DIAGNOSIS — I719 Aortic aneurysm of unspecified site, without rupture: Secondary | ICD-10-CM | POA: Diagnosis present

## 2019-03-01 DIAGNOSIS — Z7951 Long term (current) use of inhaled steroids: Secondary | ICD-10-CM | POA: Diagnosis not present

## 2019-03-01 DIAGNOSIS — M19042 Primary osteoarthritis, left hand: Secondary | ICD-10-CM | POA: Diagnosis present

## 2019-03-01 DIAGNOSIS — N760 Acute vaginitis: Secondary | ICD-10-CM | POA: Diagnosis present

## 2019-03-01 DIAGNOSIS — Z79899 Other long term (current) drug therapy: Secondary | ICD-10-CM | POA: Diagnosis not present

## 2019-03-01 DIAGNOSIS — Z881 Allergy status to other antibiotic agents status: Secondary | ICD-10-CM | POA: Diagnosis not present

## 2019-03-01 DIAGNOSIS — F418 Other specified anxiety disorders: Secondary | ICD-10-CM | POA: Diagnosis present

## 2019-03-01 DIAGNOSIS — Y828 Other medical devices associated with adverse incidents: Secondary | ICD-10-CM | POA: Diagnosis present

## 2019-03-01 DIAGNOSIS — I10 Essential (primary) hypertension: Secondary | ICD-10-CM | POA: Diagnosis present

## 2019-03-01 DIAGNOSIS — L02214 Cutaneous abscess of groin: Secondary | ICD-10-CM | POA: Diagnosis present

## 2019-03-01 DIAGNOSIS — L905 Scar conditions and fibrosis of skin: Secondary | ICD-10-CM | POA: Diagnosis present

## 2019-03-01 DIAGNOSIS — M19041 Primary osteoarthritis, right hand: Secondary | ICD-10-CM | POA: Diagnosis present

## 2019-03-01 DIAGNOSIS — K4090 Unilateral inguinal hernia, without obstruction or gangrene, not specified as recurrent: Secondary | ICD-10-CM | POA: Diagnosis present

## 2019-03-01 DIAGNOSIS — M5136 Other intervertebral disc degeneration, lumbar region: Secondary | ICD-10-CM | POA: Diagnosis present

## 2019-03-01 DIAGNOSIS — K219 Gastro-esophageal reflux disease without esophagitis: Secondary | ICD-10-CM | POA: Diagnosis present

## 2019-03-01 MED ORDER — HYDROMORPHONE HCL 2 MG PO TABS: 2.0000 mg | ORAL_TABLET | Freq: Four times a day (QID) | ORAL | Status: DC | PRN

## 2019-03-01 MED ORDER — SODIUM CHLORIDE 0.9 % IV SOLN: 250.0000 mL | INTRAVENOUS | Status: DC | PRN

## 2019-03-01 MED ORDER — SODIUM CHLORIDE 0.9% FLUSH
3.0000 mL | Freq: Two times a day (BID) | INTRAVENOUS | Status: DC
  Administered 2019-03-01: 10:00:00 3 mL via INTRAVENOUS

## 2019-03-01 MED ORDER — SODIUM CHLORIDE 0.9% FLUSH: 3.0000 mL | INTRAVENOUS | Status: DC | PRN

## 2019-03-01 MED ORDER — LACTATED RINGERS IV BOLUS: 1000.0000 mL | Freq: Three times a day (TID) | INTRAVENOUS | Status: DC | PRN

## 2019-03-01 NOTE — Progress Notes (Signed)
Catherine [name] [id number] July 25, 1946  CARE TEAM:  PCP: Jonathon Jordan, MD  Outpatient Care Team: Patient Care Team: Jonathon Jordan, MD as PCP - General (Family Medicine) Felecia Shelling, Nanine Means, MD as Consulting Physician (Neurology) Michael Boston, MD as Consulting Physician (General Surgery) Ronald Lobo, MD as Consulting Physician (Gastroenterology) Tommy Medal, Lavell Islam, MD as Consulting Physician (Infectious Diseases) Jackelyn Knife, MD as Rounding Team (Internal Medicine) Lorenda Cahill [place] Constable, MD as Consulting Physician (Infectious Diseases)  Inpatient Treatment Team: Treatment Team: Attending Provider: Michael Boston, MD; Registered Nurse: Heloise Ochoa, RN; Registered Nurse: Guadlupe Spanish, RN   Problem List:   Active Problems:   Abscess of groin, right   1 Day Post-Op  02/28/2019  Procedure(s): LAPAROSCOPIC EXPLORATION WITH REMOVAL OF RIGHT PREPERITONEAL MESH GROIN EXPLORATION AND DEBRIDEMENT LAPAROSCOPIC LYSIS OF ADHESIONS EXCISION VAGINAL ABSCESS  AND DISSECTION    Assessment  OK  [facility] Stay = 1 days)  Plan:  Advance to solid diet  Follow off IV fluids.  Physical therapy consultation for right leg weakness -hip flexion.Marland Kitchen  Possible obturator weakness from block versus nerve paresis or injury related to dissection to remove mesh and preperitoneal space  Follow-up on cultures per Dr. Drucilla Schmidt with infectious disease.  Make sure there sensitivities to her current antibiotic regimen of Avelox, Zyvox, clofazimine  VTE prophylaxis- SCDs, etc  No anxiety for now.      25 minutes spent in review, evaluation, examination, counseling, and coordination of care.  More than 50% of that time was spent in counseling.  03/01/2019    Subjective: (Chief complaint)  Some weakness at right thigh with lifting and pulling in.  Feels like it was the last surgery.  Optimistic it will get better.  Minimal pain.  Nausea fading away.  Wants to try and advance  diet.  Appreciative of our care  Objective:  Vital signs:  Vitals:   02/28/19 2354 03/01/19 0101 03/01/19 0203 03/01/19 0441  BP: (!) 115/56 (!) 113/54 101/64 132/67  Pulse: 70 86 88 85  Resp: 18 18 18 18   Temp: (!) 97.3 F (36.3 C) (!) 97.5 F (36.4 C) (!) 97.5 F (36.4 C) 98.4 F (36.9 C)  TempSrc: Oral Oral Oral Oral  SpO2: 98% 100% 100% 98%  Weight:      Height:        Last BM Date: 02/28/19  Intake/Output   Yesterday:  07/27 0701 - 07/28 0700 In: 3463.4 [P.O.:480; I.V.:2823.9; IV Piggyback:159.5] Out: 800 [Urine:550; Blood:250] This shift:  No intake/output data recorded.  Bowel function:  Flatus: YES  BM:  No  Drain: (No drain)   Physical Exam:  General: Pt awake/alert/oriented x4 in no acute distress Eyes: PERRL, normal EOM.  Sclera clear.  No icterus Neuro: CN II-XII intact w/o focal sensory/motor deficits. Lymph: No head/neck/groin lymphadenopathy Psych:  No delerium/psychosis/paranoia HENT: Normocephalic, Mucus membranes moist.  No thrush Neck: Supple, No tracheal deviation Chest:  No chest wall pain w good excursion CV:  Pulses intact.  Regular rhythm MS: Normal AROM mjr joints.  No obvious deformity Abdomen: Soft.  Nondistended.  Nontender.  No evidence of peritonitis.  No incarcerated hernias.  GU: Right groin wound with thinly serosanguineous fluid.  No obvious recurrent hernias  Ext:   Some weakness of right thigh to a duction and hip flexion.  Able to walk to bathroom with some assistance and cane.  No deformity.  No mjr edema.  No cyanosis Skin: No petechiae / purpura  Results:   Cultures: Recent Results (  from the past 720 hour(s))  SARS Coronavirus 2 (Performed in [facility] hospital lab)     Status: None   Collection Time: 02/24/19 10:37 AM   Specimen: Nasal Swab  Result Value Ref Range Status   SARS Coronavirus 2 NEGATIVE NEGATIVE Final    Comment: (NOTE) SARS-CoV-2 target nucleic acids are NOT DETECTED. The SARS-CoV-2 RNA is  generally detectable in upper and lower respiratory specimens during the acute phase of infection. Negative results do not preclude SARS-CoV-2 infection, do not rule out co-infections with other pathogens, and should not be used as the sole basis for treatment or other patient management decisions. Negative results must be combined with clinical observations, patient history, and epidemiological information. The expected result is Negative. Fact Sheet for Patients: SugarRoll.be Fact Sheet for Healthcare Providers: https://www.woods-mathews.com/ This test is not yet approved or cleared by the [place] FDA and  has been authorized for detection and/or diagnosis of SARS-CoV-2 by FDA under an Emergency Use Authorization (EUA). This EUA will remain  in effect (meaning this test can be used) for the duration of the COVID-19 declaration under Section 56 4(b)(1) of the Act, 21 U.S.C. section 360bbb-3(b)(1), unless the authorization is terminated or revoked sooner. Performed at [facility], [place] [address], [place], [place] [address]     Labs: No results found for this or any previous visit (from the past 48 hour(s)).  Imaging / Studies: No results found.  Medications / Allergies: per chart  Antibiotics: Anti-infectives (From admission, onward)   Start     Dose/Rate Route Frequency Ordered Stop   02/28/19 1343  clindamycin (CLEOCIN) 900 MG/50ML IVPB    Note to Pharmacy: Kyra Leyland   : cabinet override      02/28/19 1343 02/28/19 1720   02/28/19 0600  gentamicin (GARAMYCIN) 380 mg in dextrose 5 % 100 mL IVPB     5 mg/kg  75.5 kg 109.5 mL/hr over 60 Minutes Intravenous On call to O.R. 02/27/19 0732 02/28/19 1734   02/27/19 0745  clindamycin (CLEOCIN) IVPB 900 mg     900 mg 100 mL/hr over 30 Minutes Intravenous On call to O.R. 02/27/19 0732 02/28/19 0559        Note: Portions of this report may have been transcribed  using voice recognition software. Every effort was made to ensure accuracy; however, inadvertent computerized transcription errors may be present.   Any transcriptional errors that result from this process are unintentional.     [name], MD, FACS, MASCRS Gastrointestinal and Minimally Invasive Surgery    [address], [place], [place] [address] [phone] Main / Paging [phone] Fax

## 2019-03-01 NOTE — Evaluation (Signed)
Physical Therapy Evaluation Patient Details Name: Catherine [name] MRN: [id number] DOB: [birth year] Today's Date: 03/01/2019   History of Present Illness  Pt is admitted for RECURRENT RIGHT GROIN MYCOBACTERIAL INFECTION and s/p laparoscopic exploration and I&D.  Clinical Impression  Patient evaluated by Physical Therapy with no further acute PT needs identified. All education has been completed and the patient has no further questions.  Pt ambulated in hallway with RW and reports slight dizziness however reports tolerable.  Pt anticipates d/c home today and states her spouse can assist her.  Pt declined RW stating she would be fine with her SPC.  Pt reports her R strength is at baseline now and she plans to return to OP PT after a few weeks of healing.  Pt able to marching in place with good hip flexion when standing with RW. See below for any follow-up Physical Therapy or equipment needs. PT is signing off. Thank you for this referral.     Follow Up Recommendations No PT follow up(declined RW, has SPC)    Equipment Recommendations  None recommended by PT    Recommendations for Other Services       Precautions / Restrictions Precautions Precautions: None Restrictions Weight Bearing Restrictions: No      Mobility  Bed Mobility Overal bed mobility: Modified Independent                Transfers Overall transfer level: Needs assistance Equipment used: None Transfers: Sit to/from Stand Sit to Stand: Supervision            Ambulation/Gait Ambulation/Gait assistance: Min guard;Supervision Gait Distance (Feet): 120 Feet Assistive device: Rolling walker (2 wheeled) Gait Pattern/deviations: Step-through pattern;Decreased stride length     General Gait Details: pt reports dizziness from medication and anesthesia and mildly unsteady with RW, however reports spouse will assist her at home  Stairs            Wheelchair Mobility    Modified Rankin (Stroke Patients  Only)       Balance Overall balance assessment: (denies any recent falls)                                           Pertinent Vitals/Pain Pain Assessment: 0-10 Pain Score: 2  Pain Location: R groin Pain Descriptors / Indicators: Sore Pain Intervention(s): Repositioned;Monitored during session    Home Living Family/patient expects to be discharged to:: Private residence Living Arrangements: Spouse/significant other   Type of Home: House Home Access: Stairs to enter Entrance Stairs-Rails: Right Entrance Stairs-Number of Steps: 4 Home Layout: One level Home Equipment: Cane - single point      Prior Function Level of Independence: Independent with assistive device(s)               Hand Dominance        Extremity/Trunk Assessment        Lower Extremity Assessment Lower Extremity Assessment: Overall WFL for tasks assessed    Cervical / Trunk Assessment Cervical / Trunk Assessment: Normal  Communication   Communication: No difficulties  Cognition Arousal/Alertness: Awake/alert Behavior During Therapy: WFL for tasks assessed/performed Overall Cognitive Status: Within Functional Limits for tasks assessed  General Comments      Exercises     Assessment/Plan    PT Assessment Patent does not need any further PT services  PT Problem List         PT Treatment Interventions      PT Goals (Current goals can be found in the Care Plan section)  Acute Rehab PT Goals PT Goal Formulation: All assessment and education complete, DC therapy    Frequency     Barriers to discharge        Co-evaluation               AM-PAC PT "6 Clicks" Mobility  Outcome Measure Help needed turning from your back to your side while in a flat bed without using bedrails?: None Help needed moving from lying on your back to sitting on the side of a flat bed without using bedrails?: None Help  needed moving to and from a bed to a chair (including a wheelchair)?: A Little Help needed standing up from a chair using your arms (e.g., wheelchair or bedside chair)?: A Little Help needed to walk in hospital room?: A Little Help needed climbing 3-5 steps with a railing? : A Little 6 Click Score: 20    End of Session Equipment Utilized During Treatment: Gait belt Activity Tolerance: Patient tolerated treatment well Patient left: in chair;with call bell/phone within reach Nurse Communication: Mobility status PT Visit Diagnosis: Difficulty in walking, not elsewhere classified (R26.2)    Time: 7076-1518 PT Time Calculation (min) (ACUTE ONLY): 11 min   Charges:   PT Evaluation $PT Eval Low Complexity: [place], PT, DPT Acute Rehabilitation Services Office: [phone] Pager: [phone]  Trena Platt 03/01/2019, 1:19 PM

## 2019-03-02 LAB — ACID FAST SMEAR (AFB, MYCOBACTERIA)
Acid Fast Smear: NEGATIVE
Acid Fast Smear: NEGATIVE

## 2019-03-02 NOTE — Discharge Instructions (Signed)
WOUND CARE  It is important that the wound be kept open.   -Keeping the skin edges apart will allow the wound to gradually heal from the base upwards.   - If the skin edges of the wound close too early, a new fluid pocket can form and infection can occur. -This is the reason to pack deeper wounds with gauze or ribbon -This is why drained wounds cannot be sewed closed right away  A healthy wound should form a lining of bright red "beefy" granulating tissue that will help shrink the wound and help the edges grow new skin into it.   -A little mucus / yellow discharge is normal (the body's natural way to try and form a scab) and should be gently washed off with soap and water with daily dressing changes.  -Green or foul smelling drainage implies bacterial colonization and can slow wound healing - a short course of antibiotic ointment (3-5 days) can help it clear up.  Call the doctor if it does not improve or worsens  -Avoid use of antibiotic ointments for more than a week as they can slow wound healing over time.    -Sometimes other wound care products will be used to reduce need for dressing changes and/or help clean up dirty wounds -Sometimes the surgeon needs to debride the wound in the office to remove dead or infected tissue out of the wound so it can heal more quickly and safely.    Change the dressing at least once a day -Wash the wound with mild soap and water gently every day.  It is good to shower or bathe the wound to help it clean out. -Use clean 4x4 gauze for medium/large wounds or ribbon plain NU-gauze for smaller wounds (it does not need to be sterile, just clean) -Keep the raw wound moist with a little saline or KY (saline) gel on the gauze.  -A dry wound will take longer to heal.  -Keep the skin dry around the wound to prevent breakdown and irritation. -Pack the wound down to the base -The goal is to keep the skin apart, not overpack the wound -Use a Q-tip or blunt-tipped kabob  stick toothpick to push the gauze down to the base in narrow or deep wounds   -Cover with a clean gauze and tape -paper or Medipore tape tend to be gentle on the skin -rotate the orientation of the tape to avoid repeated stress/trauma on the skin -using an ACE or Coban wrap on wounds on arms or legs can be used instead.  Complete all antibiotics through the entire prescription to help the infection heal and prevent new places of infection   Returning the see the surgeon is helpful to follow the healing process and help the wound close as fast as possible.    GENERAL SURGERY: POST OP INSTRUCTIONS  ######################################################################  EAT Gradually transition to a high fiber diet with a fiber supplement over the next few weeks after discharge.  Start with a pureed / full liquid diet (see below)  WALK Walk an hour a day.  Control your pain to do that.    CONTROL PAIN Control pain so that you can walk, sleep, tolerate sneezing/coughing, go up/down stairs.  HAVE A BOWEL MOVEMENT DAILY Keep your bowels regular to avoid problems.  OK to try a laxative to override constipation.  OK to use an antidairrheal to slow down diarrhea.  Call if not better after 2 tries  CALL IF YOU HAVE PROBLEMS/CONCERNS Call if  you are still struggling despite following these instructions. Call if you have concerns not answered by these instructions  ######################################################################    1. DIET: Follow a light bland diet & liquids the first 24 hours after arrival home, such as soup, liquids, starches, etc.  Be sure to drink plenty of fluids.  Quickly advance to a usual solid diet within a few days.  Avoid fast food or heavy meals as your are more likely to get nauseated or have irregular bowels.  A low-fat, high-fiber diet for the rest of your life is ideal.   2. Take your usually prescribed home medications unless otherwise  directed. 3. PAIN CONTROL: a. Pain is best controlled by a usual combination of three different methods TOGETHER: i. Ice/Heat ii. Over the counter pain medication iii. Prescription pain medication b. Most patients will experience some swelling and bruising around the incisions.  Ice packs or heating pads (30-60 minutes up to 6 times a day) will help. Use ice for the first few days to help decrease swelling and bruising, then switch to heat to help relax tight/sore spots and speed recovery.  Some people prefer to use ice alone, heat alone, alternating between ice & heat.  Experiment to what works for you.  Swelling and bruising can take several weeks to resolve.   c. It is helpful to take an over-the-counter pain medication regularly for the first few weeks.  Choose one of the following that works best for you: i. Naproxen (Aleve, etc)  Two 220mg  tabs twice a day ii. Ibuprofen (Advil, etc) Three 200mg  tabs four times a day (every meal & bedtime) iii. Acetaminophen (Tylenol, etc) 500-650mg  four times a day (every meal & bedtime) d. A  prescription for pain medication (such as oxycodone, hydrocodone, etc) should be given to you upon discharge.  Take your pain medication as prescribed.  i. If you are having problems/concerns with the prescription medicine (does not control pain, nausea, vomiting, rash, itching, etc), please call us [phone] to see if we need to switch you to a different pain medicine that will work better for you and/or control your side effect better. ii. If you need a refill on your pain medication, please contact your pharmacy.  They will contact our office to request authorization. Prescriptions will not be filled after 5 pm or on week-ends. 4. Avoid getting constipated.  Between the surgery and the pain medications, it is common to experience some constipation.  Increasing fluid intake and taking a fiber supplement (such as Metamucil, Citrucel, FiberCon, MiraLax, etc) 1-2 times  a day regularly will usually help prevent this problem from occurring.  A mild laxative (prune juice, Milk of Magnesia, MiraLax, etc) should be taken according to package directions if there are no bowel movements after 48 hours.   5. Wash / shower every day.  You may shower over the dressings as they are waterproof.  Continue to shower over incision(s) after the dressing is off. 6. Remove your waterproof bandages 5 days after surgery.  You may leave the incision open to air.  You may have skin tapes (Steri Strips) covering the incision(s).  Leave them on until one week, then remove.  You may replace a dressing/Band-Aid to cover the incision for comfort if you wish.      7. ACTIVITIES as tolerated:   a. You may resume regular (light) daily activities beginning the next day--such as daily self-care, walking, climbing stairs--gradually increasing activities as tolerated.  If you can walk 30  minutes without difficulty, it is safe to try more intense activity such as jogging, treadmill, bicycling, low-impact aerobics, swimming, etc. b. Save the most intensive and strenuous activity for last such as sit-ups, heavy lifting, contact sports, etc  Refrain from any heavy lifting or straining until you are off narcotics for pain control.   c. DO NOT PUSH THROUGH PAIN.  Let pain be your guide: If it hurts to do something, don't do it.  Pain is your body warning you to avoid that activity for another week until the pain goes down. d. You may drive when you are no longer taking prescription pain medication, you can comfortably wear a seatbelt, and you can safely maneuver your car and apply brakes. e. Dennis Bast may have sexual intercourse when it is comfortable.  8. FOLLOW UP in our office a. Please call [facility] at [phone] to set up an appointment to see your surgeon in the office for a follow-up appointment approximately 2-3 weeks after your surgery. b. Make sure that you call for this appointment the day you arrive  home to insure a convenient appointment time. 9. IF YOU HAVE DISABILITY OR FAMILY LEAVE FORMS, BRING THEM TO THE OFFICE FOR PROCESSING.  DO NOT GIVE THEM TO YOUR DOCTOR.   WHEN TO CALL us [phone]: 1. Poor pain control 2. Reactions / problems with new medications (rash/itching, nausea, etc)  3. Fever over 101.5 F (38.5 C) 4. Worsening swelling or bruising 5. Continued bleeding from incision. 6. Increased pain, redness, or drainage from the incision 7. Difficulty breathing / swallowing   The clinic staff is available to answer your questions during regular business hours (8:30am-5pm).  Please dont hesitate to call and ask to speak to one of our nurses for clinical concerns.   If you have a medical emergency, go to the nearest emergency room or call 911.  A surgeon from [facility] is always on call at the [facility], [place], [place], [place], [place]  [address] ? MAIN: [phone] ? TOLL FREE: [phone] ?  FAX [phone] [email or web address]

## 2019-03-02 NOTE — Progress Notes (Signed)
Pt was discharged home today. Instructions were reviewed with patient, and questions were answered. Pt was taken to main entrance via wheelchair by NT.  

## 2019-03-02 NOTE — Discharge Summary (Signed)
Physician Discharge Summary    Patient ID: Catherine [name] MRN: [id number] DOB/AGE: June 20, 1946  73 y.o.  Patient Care Team: Jonathon Jordan, MD as PCP - General (Family Medicine) Felecia Shelling, Nanine Means, MD as Consulting Physician (Neurology) Michael Boston, MD as Consulting Physician (General Surgery) Ronald Lobo, MD as Consulting Physician (Gastroenterology) Tommy Medal, Lavell Islam, MD as Consulting Physician (Infectious Diseases) Jackelyn Knife, MD as Rounding Team (Internal Medicine) Laurin Coder, MD as Consulting Physician (Infectious Diseases)  Admit date: 02/28/2019  Discharge date: 03/02/2019  Hospital Stay = 2 days    Discharge Diagnoses:  Principal Problem:   Soft tissue infection - Mycobaterium fortuitum   Active Problems:   Depression with anxiety   Anxiety   Mycobacterial lymphadenitis   Mycobacterium fortuitum infection   Abscess of groin, right   2 Days Post-Op  02/28/2019  POST-OPERATIVE DIAGNOSIS:   RECURRENT RIGHT GROIN MYCOBACTERIAL INFECTION HISTORY OF INGUINAL HERNIA REPAIR WITH MESH  PROCEDURE:   LAPAROSCOPIC EXPLORATION WITH REMOVAL OF RIGHT PREPERITONEAL MESH RIGHT GROIN EXPLORATION WITH RE-EXCISION OF GROIN ABSCESSES PRIMARY RIGHT INGUINAL HERNIA REPAIR LAPAROSCOPIC LYSIS OF ADHESIONS EXCISION RLQ ABDOMINAL WALL SCAR  SURGEON:  [name], MD  Consults: None  Hospital Course:   The patient underwent the surgery above.  Postoperatively, the patient gradually mobilized and advanced to a solid diet.  Pain and other symptoms were treated aggressively.  She had some mild weakness to right hip flexion and adduction.  That improved over the next 24 hours.  Physical therapy cleared her as well.  By the time of discharge, the patient was walking well the hallways, eating food, having flatus.  Pain was well-controlled on an oral medications.  Based on meeting discharge criteria and continuing to recover, I felt it was safe for the  patient to be discharged from the hospital to further recover with close followup. Postoperative recommendations were discussed in detail.  They are written as well.  Discharged Condition: good  Discharge Exam: Blood pressure 118/65, pulse 71, temperature 98 F (36.7 C), temperature source Oral, resp. rate 18, height 5\' 10"  (1.778 m), weight 75.5 kg, SpO2 93 %.  General: Pt awake/alert/oriented x4 in No acute distress Eyes: PERRL, normal EOM.  Sclera clear.  No icterus Neuro: CN II-XII intact w/o focal sensory/motor deficits. Lymph: No head/neck/groin lymphadenopathy Psych:  No delerium/psychosis/paranoia HENT: Normocephalic, Mucus membranes moist.  No thrush Neck: Supple, No tracheal deviation Chest: No chest wall pain w good excursion CV:  Pulses intact.  Regular rhythm MS: Normal AROM mjr joints.  No obvious deformity Abdomen: Soft.  Nondistended.  Nontender.  No evidence of peritonitis.  No incarcerated hernias. Right groin wound more clean and dry.  Right flank posterior ecchymosis. Ext:  SCDs BLE.  No mjr edema.  No cyanosis Skin: No petechiae / purpura   Disposition:   Follow-up Information    Michael Boston, MD. Schedule an appointment as soon as possible for a visit in 3 week(s).   Specialty: General Surgery Contact information: [address] [place] [address] [phone]        Tommy Medal, Lavell Islam, MD. Call.   Specialty: Infectious Diseases Why: Call for follow up appointment concerning antibiotics Contact information: [address] [place] [address] [phone]           Discharge disposition: 01-Home or Self Care       Discharge Instructions    Call MD for:   Complete by: As directed    FEVER >  101.5 F (Temperatures <101.71F can occasionally happen and are not significant)   Call MD for:  extreme fatigue   Complete by: As directed    Call MD for:  persistant dizziness or light-headedness   Complete by: As  directed    Call MD for:  persistant nausea and vomiting   Complete by: As directed    Call MD for:  redness, tenderness, or signs of infection (pain, swelling, redness, odor or green/yellow discharge around incision site)   Complete by: As directed    Call MD for:  severe uncontrolled pain   Complete by: As directed    Diet - low sodium heart healthy   Complete by: As directed    Follow a light diet the first few days at home.    If you feel full, bloated, or constipated, stay on a liquid diet until you feel better and not constipated. Gradually get back to a solid diet.  Avoid fast food or heavy meals the first week as you are more likely to get nauseated. It is expected for your digestive tract to need a few months to get back to normal.   Discharge wound care:   Complete by: As directed    You have an open wound.  In general, it is encouraged that you remove your dressing and packing, shower with soap & water, and replace your dressing once a day.   Pack the wound with clean gauze moistened with normal (0.9%) saline or KY gel to keep the wound moist & uninfected.    .   Eventually your body will heal & pull the open wound closed over the next few months.  Raw open wounds will occasionally bleed or secrete yellow drainage until it heals closed.   Pressure on the dressing for 30 minutes will stop most wound bleeding Drain sites will drain a little until the drain is removed.   Driving Restrictions   Complete by: As directed    You may drive when you are no longer taking narcotic prescription pain medication, you can comfortably wear a seatbelt, and you can safely make sudden turns/stops to protect yourself without hesitating due to pain.   Increase activity slowly   Complete by: As directed    Lifting restrictions   Complete by: As directed    Start light daily activities --- self-care, walking, climbing stairs- beginning the day after surgery.   Gradually increase activities as  tolerated.   Control your pain to be active.   Stop when you are tired.   Ideally, walk several times a day, eventually an hour a day.   Most people are back to most day-to-day activities in a few weeks.  It takes 4-8 weeks to get back to unrestricted, intense activity. If you can walk 30 minutes without difficulty, it is safe to try more intense activity such as jogging, treadmill, bicycling, low-impact aerobics, swimming, etc. Save the most intensive and strenuous activity for last (Usually 4-8 weeks after surgery) such as sit-ups, heavy lifting, contact sports, etc.   Refrain from any intense heavy lifting or straining until you are off narcotics for pain control.  You will have off days, but things should improve week-by-week. DO NOT PUSH THROUGH PAIN.   Let pain be your guide: If it hurts to do something, don't do it.  Pain is your body warning you to avoid that activity for another week until the pain goes down.   May shower / Bathe   Complete by:  As directed    May walk up steps   Complete by: As directed    Sexual Activity Restrictions   Complete by: As directed    You may have sexual intercourse when it is comfortable. If it hurts to do something, stop.      Allergies as of 03/02/2019      Reactions   Ciprofloxacin Hcl Other (See Comments)   Aortic aneurysm per MD   Metoprolol Other (See Comments), Cough   and wheezing   Penicillins Shortness Of Breath, Rash, Other (See Comments)   Has patient had a PCN reaction causing immediate rash, facial/tongue/throat swelling, SOB or lightheadedness with hypotension: Yes Has patient had a PCN reaction causing severe rash involving mucus membranes or skin necrosis: No Has patient had a PCN reaction that required hospitalization: Yes - MD office Has patient had a PCN reaction occurring within the last 10 years: No If all of the above answers are "NO", then may proceed with Cephalosporin use.   Oxycodone Other (See Comments)   Nightmares.   Tolerates tramadol   Advair Diskus [fluticasone-salmeterol] Anxiety   Sulfa Antibiotics Rash      Medication List    TAKE these medications   acetaminophen 500 MG tablet Commonly known as: TYLENOL Take 1,000 mg by mouth 2 (two) times a day.   albuterol 108 (90 Base) MCG/ACT inhaler Commonly known as: VENTOLIN HFA Inhale 2 puffs into the lungs every 4 (four) hours as needed for wheezing or shortness of breath.   AMBULATORY NON FORMULARY MEDICATION Take 2 capsules by mouth daily. Medication Name: clofazimine   budesonide-formoterol 160-4.5 MCG/ACT inhaler Commonly known as: SYMBICORT Inhale 2 puffs into the lungs 2 (two) times daily.   CALCIUM CARBONATE ANTACID PO Take 1,000 mg by mouth daily.   carboxymethylcellulose 0.5 % Soln Commonly known as: REFRESH PLUS Place 2 drops into both eyes 3 (three) times daily.   Refresh Liquigel 1 % Gel Generic drug: Carboxymethylcellulose Sodium Place 1 drop into both eyes at bedtime.   denosumab 60 MG/ML Sosy injection Commonly known as: PROLIA Inject 60 mg into the skin every 6 (six) months.   docusate sodium 100 MG capsule Commonly known as: COLACE Take 200 mg by mouth daily.   esomeprazole 20 MG capsule Commonly known as: NEXIUM Take 20 mg by mouth daily.   fluticasone 50 MCG/ACT nasal spray Commonly known as: FLONASE Place 1 spray into both nostrils daily.   gabapentin 100 MG capsule Commonly known as: NEURONTIN Take 100 mg by mouth at bedtime.   HYDROmorphone 2 MG tablet Commonly known as: DILAUDID Take 2 mg by mouth every 6 (six) hours as needed for severe pain.   linezolid 600 MG tablet Commonly known as: ZYVOX Take 1 tablet (600 mg total) by mouth 2 (two) times daily.   LORazepam 1 MG tablet Commonly known as: ATIVAN Take 0.5-1 mg by mouth See admin instructions. Take 0.5 mg by mouth in the morning and take 1 mg by mouth at bedtime   moxifloxacin 400 MG tablet Commonly known as: Avelox Take 1 tablet (400 mg  total) by mouth daily at 8 pm.   naproxen sodium 220 MG tablet Commonly known as: ALEVE Take 2 tablets (440 mg total) by mouth every morning.   polyethylene glycol 17 g packet Commonly known as: MIRALAX / GLYCOLAX Take 17 g by mouth daily.   Spiriva HandiHaler 18 MCG inhalation capsule Generic drug: tiotropium Place 18 mcg into inhaler and inhale daily.   SUMAtriptan 50  MG tablet Commonly known as: IMITREX Take 50 mg by mouth every 2 (two) hours as needed for migraine. May repeat in 2 hours if headache persists or recurs.   Taztia XT 240 MG 24 hr capsule Generic drug: diltiazem Take 240 mg by mouth daily.   traMADol 50 MG tablet Commonly known as: ULTRAM Take 50 mg by mouth every 6 (six) hours as needed for moderate pain.   VITAMIN B-12 PO Take 2,000 mcg by mouth daily.   Vitamin D3 125 MCG (5000 UT) Caps Take 5,000 Units by mouth daily.            Discharge Care Instructions  (From admission, onward)         Start     Ordered   03/02/19 0000  Discharge wound care:    Comments: You have an open wound.  In general, it is encouraged that you remove your dressing and packing, shower with soap & water, and replace your dressing once a day.   Pack the wound with clean gauze moistened with normal (0.9%) saline or KY gel to keep the wound moist & uninfected.    .   Eventually your body will heal & pull the open wound closed over the next few months.  Raw open wounds will occasionally bleed or secrete yellow drainage until it heals closed.   Pressure on the dressing for 30 minutes will stop most wound bleeding Drain sites will drain a little until the drain is removed.   03/02/19 0708          Significant Diagnostic Studies:  Results for orders placed or performed during the hospital encounter of 02/28/19 (from the past 72 hour(s))  Aerobic/Anaerobic Culture (surgical/deep wound)     Status: None (Preliminary result)   Collection Time: 02/28/19  9:24 PM    Specimen: Groin, Right; Abscess  Result Value Ref Range   Specimen Description      GROIN Performed at [facility] [address], [place], [place] [address]    Special Requests      NONE Performed at [facility], [place] [address] [place], [place] [address]    Gram Stain      RARE WBC PRESENT, PREDOMINANTLY MONONUCLEAR RARE GRAM POSITIVE COCCI Performed at [facility], [place] [address] [place], [place] [address]    Culture PENDING    Report Status PENDING   Aerobic/Anaerobic Culture (surgical/deep wound)     Status: None (Preliminary result)   Collection Time: 02/28/19  9:40 PM   Specimen: Foreign Body Removal; Other  Result Value Ref Range   Specimen Description      PERITONEAL Performed at [facility], [place] [address], [place], [place] [address]    Special Requests      NONE Performed at [facility], [place] [address], [place], [place] [address]    Gram Stain      RARE WBC PRESENT, PREDOMINANTLY PMN NO ORGANISMS SEEN Performed at [facility], [place] [address], [place], [place] [address]    Culture PENDING    Report Status PENDING     No results found.  Past Medical History:  Diagnosis Date  . Anxiety   . Aortic aneurysm ([place]) 12/08/2018  . Arthritis    HANDS  . Chronic constipation   . Complication of anesthesia    upon waking up from anesthesia patient experienced chest pain - EKG done was negative, patient states "it may have been  indigestion"  . COPD (chronic obstructive pulmonary disease) ([place])   . DDD (degenerative disc disease), lumbar    L2-3/3-4/4-5  . Dizziness 01/17/2019  . DOE (dyspnea on exertion) 01/17/2019  . Dysuria   . Frequency of urination   . Ganglion cyst of right groin 11/23/2018  . GERD (gastroesophageal reflux disease)   . Hearing aid worn   . Hypertension   . Infected hernioplasty mesh ([place]) 01/26/2019  . Migraine   . Mild emphysema ([place])   .  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 Osteopenia 2019   TO OSTEOPOROSIS   . Pelvic pain in female   . Smokers' cough ([place])   . Thoracic aortic aneurysm (TAA) ([facility])    FOLLOWED BY DR BARTLE  LAST CT CHEST  WAS 6-3 SEE EPIC   . Urgency of urination    h/o INTERSTIAL CYSTITIS...DR. HUMPHRIES/GRAPEY  . Wears glasses     Past Surgical History:  Procedure Laterality Date  . ABDOMINAL HYSTERECTOMY  1982   W/  UNILATERAL SALPINGOOPHORECTOMY  . bilateral hernia surgery      removed ganglion cyst and swollen lymph node   . CHOLECYSTECTOMY OPEN  1987   W/  APPENDECTOMY  . COLONOSCOPY  2002  &  12-2012 & 03/19/15  . CYSTO WITH HYDRODISTENSION N/A 02/20/2014   Procedure: Sterling AND MARCAINE INSTILLATION;  Surgeon: Bernestine Amass, MD;  Location: [facility];  Service: Urology;  Laterality: N/A;  . EXCISION MASS LOWER EXTREMETIES Right 11/02/2018   Procedure: REMOVAL OF RIGHT GROIN AND THIGH SUBCUTANEOUS MASSES.;  Surgeon: Michael Boston, MD;  Location: [facility];  Service: General;  Laterality: Right;  . EYE SURGERY     bilateral cataract surgery   . GROIN DISSECTION N/A 02/28/2019   Procedure: Virl Son EXPLORATION AND DEBRIDEMENT;  Surgeon: Michael Boston, MD;  Location: [facility];  Service: General;  Laterality: N/A;  . INCISION AND DRAINAGE ABSCESS N/A 12/09/2018   Procedure: INCISION AND DRAINAGE RIGHT GROIN and thigh  WITH DEBRIDEMENT;  Surgeon: Michael Boston, MD;  Location: [facility];  Service: General;  Laterality: N/A;  . LAPAROSCOPIC ABDOMINAL EXPLORATION Right 02/28/2019   Procedure: LAPAROSCOPIC EXPLORATION WITH REMOVAL OF RIGHT PREPERITONEAL MESH;  Surgeon: Michael Boston, MD;  Location: [facility];  Service: General;  Laterality: Right;  . LAPAROSCOPIC LYSIS OF ADHESIONS Bilateral 02/28/2019   Procedure: LAPAROSCOPIC LYSIS OF ADHESIONS;  Surgeon: Michael Boston, MD;  Location: [facility];  Service: General;  Laterality: Bilateral;  . LAPAROSCOPIC UNILATERAL  SALPINGOOPHORECTOMY  1984  . LESION REMOVAL Right 02/28/2019   Procedure: EXCISION VAGINAL ABSCESS  AND DISSECTION;  Surgeon: Michael Boston, MD;  Location: [facility];  Service: General;  Laterality: Right;  . MASS EXCISION Right 06/16/2018   Procedure: REMOVAL OF RIGHT GROIN SUBCUTANEOUS MASS ERAS PATHWAY;  Surgeon: Michael Boston, MD;  Location: [facility];  Service: General;  Laterality: Right;  . removal of lymph node cyst in right groin     . ROTATOR CUFF REPAIR Right 2003   and BONE SPUR    Social History   Socioeconomic History  . Marital status: Married    Spouse name: Not on file  . Number of children: Not on file  . Years of education: Not on file  . Highest education level: Not on file  Occupational History  . Not on file  Social Needs  . Financial resource strain: Not on file  . Food insecurity    Worry: Not on file    Inability: Not on file  .  Transportation needs    Medical: Not on file    Non-medical: Not on file  Tobacco Use  . Smoking status: Former Smoker    Packs/day: 0.75    Years: 48.00    Pack years: 36.00    Types: Cigarettes    Quit date: 09/04/2017    Years since quitting: 1.4  . Smokeless tobacco: Never Used  Substance and Sexual Activity  . Alcohol use: No  . Drug use: No  . Sexual activity: Not on file  Lifestyle  . Physical activity    Days per week: Not on file    Minutes per session: Not on file  . Stress: Not on file  Relationships  . Social Herbalist on phone: Not on file    Gets together: Not on file    Attends religious service: Not on file    Active member of club or organization: Not on file    Attends meetings of clubs or organizations: Not on file    Relationship status: Not on file  . Intimate partner violence    Fear of current or ex partner: Not on file    Emotionally abused: Not on file    Physically abused: Not on file    Forced sexual activity: Not on file  Other Topics Concern  . Not on file  Social History  Narrative  . Not on file    Family History  Problem Relation Age of Onset  . Esophageal cancer Mother 49  . Lung cancer Father 40    Current Facility-Administered Medications  Medication Dose Route Frequency Provider Last Rate Last Dose  . 0.9 %  sodium chloride infusion   Intravenous Continuous Michael Boston, MD 50 mL/hr at 02/28/19 2349    . 0.9 %  sodium chloride infusion  250 mL Intravenous PRN Michael Boston, MD      . acetaminophen (TYLENOL) tablet 1,000 mg  1,000 mg Oral Tor Netters, MD   1,000 mg at 03/02/19 0528  . albuterol (PROVENTIL) (2.5 MG/3ML) 0.083% nebulizer solution 2.5 mg  2.5 mg Nebulization Q4H PRN Michael Boston, MD      . bisacodyl (DULCOLAX) suppository 10 mg  10 mg Rectal Daily PRN Michael Boston, MD      . cholecalciferol (VITAMIN D3) tablet 5,000 Units  5,000 Units Oral Daily Michael Boston, MD   5,000 Units at 03/01/19 [phone]  . diltiazem (CARDIZEM CD) 24 hr capsule 240 mg  240 mg Oral Daily Michael Boston, MD   240 mg at 03/01/19 0957  . diphenhydrAMINE (BENADRYL) 12.5 MG/5ML elixir 12.5 mg  12.5 mg Oral Q6H PRN Michael Boston, MD       Or  . diphenhydrAMINE (BENADRYL) injection 12.5 mg  12.5 mg Intravenous Q6H PRN Michael Boston, MD      . docusate sodium (COLACE) capsule 200 mg  200 mg Oral Daily Michael Boston, MD   200 mg at 03/01/19 0957  . enoxaparin (LOVENOX) injection 40 mg  40 mg Subcutaneous Q24H Michael Boston, MD   40 mg at 03/01/19 0958  . fluticasone (FLONASE) 50 MCG/ACT nasal spray 1 spray  1 spray Each Nare Daily Michael Boston, MD   1 spray at 03/01/19 1000  . gabapentin (NEURONTIN) capsule 300 mg  300 mg Oral BID Michael Boston, MD   300 mg at 03/01/19 2141  . hydrALAZINE (APRESOLINE) injection 5-20 mg  5-20 mg Intravenous Q4H PRN Michael Boston, MD      . HYDROmorphone (DILAUDID) injection  0.5-2 mg  0.5-2 mg Intravenous Q2H PRN Michael Boston, MD   1 mg at 02/28/19 2350  . HYDROmorphone (DILAUDID) tablet 2-4 mg  2-4 mg Oral Q6H PRN Michael Boston, MD       . lactated ringers bolus 1,000 mL  1,000 mL Intravenous Q8H PRN Nivan Melendrez, Remo Lipps, MD      . lactated ringers infusion 1,000 mL  1,000 mL Intravenous Q8H PRN Alexandra Lipps, Remo Lipps, MD      . LORazepam (ATIVAN) tablet 0.5 mg  0.5 mg Oral Daily Michael Boston, MD   0.5 mg at 03/01/19 1006  . LORazepam (ATIVAN) tablet 1 mg  1 mg Oral Ardeen Fillers, MD   1 mg at 03/01/19 2141  . methocarbamol (ROBAXIN) tablet 750 mg  750 mg Oral Q6H PRN Michael Boston, MD      . mometasone-formoterol [facility]) 200-5 MCG/ACT inhaler 2 puff  2 puff Inhalation BID Michael Boston, MD   2 puff at 03/01/19 [phone]  . ondansetron (ZOFRAN-ODT) disintegrating tablet 4 mg  4 mg Oral Q6H PRN Michael Boston, MD       Or  . ondansetron [facility]) injection 4 mg  4 mg Intravenous Q6H PRN Michael Boston, MD   4 mg at 03/01/19 0451  . pantoprazole (PROTONIX) EC tablet 40 mg  40 mg Oral Daily Michael Boston, MD   40 mg at 03/01/19 0956  . polyethylene glycol (MIRALAX / GLYCOLAX) packet 17 g  17 g Oral Daily Michael Boston, MD   17 g at 03/01/19 8527  . polyethylene glycol (MIRALAX / GLYCOLAX) packet 17 g  17 g Oral Daily PRN Michael Boston, MD      . polyvinyl alcohol (LIQUIFILM TEARS) 1.4 % ophthalmic solution 2 drop  2 drop Both Eyes TID Michael Boston, MD   2 drop at 03/01/19 1715  . prochlorperazine (COMPAZINE) tablet 10 mg  10 mg Oral Q6H PRN Michael Boston, MD       Or  . prochlorperazine (COMPAZINE) injection 5-10 mg  5-10 mg Intravenous Q6H PRN Michael Boston, MD      . simethicone (MYLICON) chewable tablet 40 mg  40 mg Oral Q6H PRN Michael Boston, MD      . sodium chloride flush (NS) 0.9 % injection 3 mL  3 mL Intravenous Gorden Harms, MD   3 mL at 03/01/19 0959  . sodium chloride flush (NS) 0.9 % injection 3 mL  3 mL Intravenous PRN Michael Boston, MD      . SUMAtriptan (IMITREX) tablet 50 mg  50 mg Oral Q2H PRN Michael Boston, MD      . traMADol Veatrice Bourbon) tablet 50 mg  50 mg Oral Q6H PRN Michael Boston, MD   50 mg at 03/01/19 1725  .  umeclidinium bromide (INCRUSE ELLIPTA) 62.5 MCG/INH 1 puff  1 puff Inhalation Daily Michael Boston, MD      . zolpidem (AMBIEN) tablet 5 mg  5 mg Oral QHS PRN Michael Boston, MD         Allergies  Allergen Reactions  . Ciprofloxacin Hcl Other (See Comments)    Aortic aneurysm per MD  . Metoprolol Other (See Comments) and Cough    and wheezing  . Penicillins Shortness Of Breath, Rash and Other (See Comments)    Has patient had a PCN reaction causing immediate rash, facial/tongue/throat swelling, SOB or lightheadedness with hypotension: Yes Has patient had a PCN reaction causing severe rash involving mucus membranes or skin necrosis: No Has patient had a PCN reaction  that required hospitalization: Yes - MD office Has patient had a PCN reaction occurring within the last 10 years: No If all of the above answers are "NO", then may proceed with Cephalosporin use.   Marland Kitchen Oxycodone Other (See Comments)    Nightmares.  Tolerates tramadol  . Advair Diskus [Fluticasone-Salmeterol] Anxiety  . Sulfa Antibiotics Rash    Signed: Morton Peters, MD, FACS, MASCRS Gastrointestinal and Minimally Invasive Surgery    [address] [place], [place] [address] [phone] Main / Paging [phone] Fax   03/02/2019, 7:09 AM

## 2019-03-06 LAB — AEROBIC/ANAEROBIC CULTURE W GRAM STAIN (SURGICAL/DEEP WOUND): Culture: NORMAL

## 2019-03-13 LAB — AEROBIC/ANAEROBIC CULTURE W GRAM STAIN (SURGICAL/DEEP WOUND)

## 2019-03-21 ENCOUNTER — Telehealth: Payer: Self-pay

## 2019-03-21 NOTE — Telephone Encounter (Signed)
She needs to be evaluated by provider asap. Can she see PCP in next 1-2 days if not she may have to go to the ER

## 2019-03-21 NOTE — Telephone Encounter (Signed)
Patient left voicemail stating her blood pressure has been dropping in the low 100s/50s. She has been feeling really tired. Wants to know if you would like to do any bloodwork prior to her upcoming appointment 08/26. Routing to provider for advise.  Catherine [name]

## 2019-03-22 NOTE — Telephone Encounter (Signed)
Ok very good I am worried about her

## 2019-03-22 NOTE — Telephone Encounter (Signed)
Made patient aware of Dr. Lucianne Lei Dam's advise to see PCP in next 1-2 days or go to the ER if she can not get in. Patient states she will contact her PCP.  Catherine [name]

## 2019-03-23 ENCOUNTER — Telehealth: Payer: Self-pay | Admitting: Pharmacist

## 2019-03-23 NOTE — Telephone Encounter (Signed)
Yes, Dr. Linus Salmons is seeing her!

## 2019-03-23 NOTE — Telephone Encounter (Signed)
Excellent. I think someone else is to see her because I am to be doing 2 weeks of virtual visits

## 2019-03-23 NOTE — Telephone Encounter (Signed)
Oh good

## 2019-03-23 NOTE — Telephone Encounter (Signed)
I have 2 bottles of clofazimine for her when she comes to clinic next week on 8/26. FYI.

## 2019-03-24 DIAGNOSIS — J449 Chronic obstructive pulmonary disease, unspecified: Secondary | ICD-10-CM | POA: Diagnosis not present

## 2019-03-24 DIAGNOSIS — I959 Hypotension, unspecified: Secondary | ICD-10-CM | POA: Diagnosis not present

## 2019-03-29 ENCOUNTER — Telehealth: Payer: Self-pay | Admitting: Internal Medicine

## 2019-03-29 NOTE — Telephone Encounter (Signed)
COVID-19 Pre-Screening Questions: ° °Do you currently have a fever (>100 °F), chills or unexplained body aches? N ° °Are you currently experiencing new cough, shortness of breath, sore throat, runny nose? N °•  °Have you recently travelled outside the state of [place] in the last 14 days? N  °•  °Have you been in contact with someone that is currently pending confirmation of Covid19 testing or has been confirmed to have the Covid19 virus?  N ° °**If the patient answers NO to ALL questions -  advise the patient to please call the clinic before coming to the office should any symptoms develop.  ° ° ° °

## 2019-03-30 ENCOUNTER — Ambulatory Visit: Payer: PPO | Admitting: Infectious Disease

## 2019-03-30 ENCOUNTER — Ambulatory Visit (INDEPENDENT_AMBULATORY_CARE_PROVIDER_SITE_OTHER): Payer: PPO | Admitting: Internal Medicine

## 2019-03-30 ENCOUNTER — Other Ambulatory Visit: Payer: Self-pay

## 2019-03-30 ENCOUNTER — Encounter: Payer: Self-pay | Admitting: Internal Medicine

## 2019-03-30 ENCOUNTER — Telehealth: Payer: Self-pay | Admitting: Pharmacist

## 2019-03-30 VITALS — BP 118/56 | HR 102 | Temp 98.6°F

## 2019-03-30 DIAGNOSIS — Z5181 Encounter for therapeutic drug level monitoring: Secondary | ICD-10-CM

## 2019-03-30 DIAGNOSIS — I712 Thoracic aortic aneurysm, without rupture, unspecified: Secondary | ICD-10-CM

## 2019-03-30 DIAGNOSIS — A318 Other mycobacterial infections: Secondary | ICD-10-CM | POA: Diagnosis not present

## 2019-03-30 DIAGNOSIS — I889 Nonspecific lymphadenitis, unspecified: Secondary | ICD-10-CM

## 2019-03-30 LAB — CBC WITH DIFFERENTIAL/PLATELET
Absolute Monocytes: 472 cells/uL (ref 200–950)
Basophils Absolute: 21 cells/uL (ref 0–200)
Basophils Relative: 0.4 %
Eosinophils Absolute: 101 cells/uL (ref 15–500)
Eosinophils Relative: 1.9 %
HCT: 36.2 % (ref 35.0–45.0)
Hemoglobin: 12.2 g/dL (ref 11.7–15.5)
Lymphs Abs: 1352 cells/uL (ref 850–3900)
MCH: 30.9 pg (ref 27.0–33.0)
MCHC: 33.7 g/dL (ref 32.0–36.0)
MCV: 91.6 fL (ref 80.0–100.0)
MPV: 10.6 fL (ref 7.5–12.5)
Monocytes Relative: 8.9 %
Neutro Abs: 3355 cells/uL (ref 1500–7800)
Neutrophils Relative %: 63.3 %
Platelets: 227 10*3/uL (ref 140–400)
RBC: 3.95 10*6/uL (ref 3.80–5.10)
RDW: 13.1 % (ref 11.0–15.0)
Total Lymphocyte: 25.5 %
WBC: 5.3 10*3/uL (ref 3.8–10.8)

## 2019-03-30 LAB — COMPLETE METABOLIC PANEL WITH GFR
AG Ratio: 2.1 (calc) (ref 1.0–2.5)
ALT: 8 U/L (ref 6–29)
AST: 13 U/L (ref 10–35)
Albumin: 4.1 g/dL (ref 3.6–5.1)
Alkaline phosphatase (APISO): 58 U/L (ref 37–153)
BUN/Creatinine Ratio: 18 (calc) (ref 6–22)
BUN: 19 mg/dL (ref 7–25)
CO2: 25 mmol/L (ref 20–32)
Calcium: 8.9 mg/dL (ref 8.6–10.4)
Chloride: 107 mmol/L (ref 98–110)
Creat: 1.07 mg/dL — ABNORMAL HIGH (ref 0.60–0.93)
GFR, Est African American: 60 mL/min/{1.73_m2} (ref >=60)
GFR, Est Non African American: 51 mL/min/{1.73_m2} — ABNORMAL LOW (ref >=60)
Globulin: 2 g/dL (calc) (ref 1.9–3.7)
Glucose, Bld: 96 mg/dL (ref 65–99)
Potassium: 4 mmol/L (ref 3.5–5.3)
Sodium: 139 mmol/L (ref 135–146)
Total Bilirubin: 0.3 mg/dL (ref 0.2–1.2)
Total Protein: 6.1 g/dL (ref 6.1–8.1)

## 2019-03-30 MED ORDER — FLUCONAZOLE 150 MG PO TABS: 150.0000 mg | ORAL_TABLET | Freq: Once | ORAL | 5 refills | Status: DC

## 2019-03-30 NOTE — Telephone Encounter (Signed)
Patient picked up 3 months of clofazimine today. 

## 2019-03-31 DIAGNOSIS — Z5181 Encounter for therapeutic drug level monitoring: Secondary | ICD-10-CM | POA: Insufficient documentation

## 2019-03-31 LAB — FUNGUS CULTURE WITH STAIN

## 2019-03-31 LAB — FUNGUS CULTURE RESULT

## 2019-03-31 LAB — FUNGAL ORGANISM REFLEX

## 2019-03-31 NOTE — Assessment & Plan Note (Signed)
Recent CT in June was stable.  Now on moxifloxacin with small associated risk of change in size.  She will check with Dr. Cyndia Bent but likely will reimage after treatment at some point.  Risk expected to be up to about 60 days after treatment completion which will be around the end of November.

## 2019-03-31 NOTE — Assessment & Plan Note (Signed)
Will check cmp and recheck cbc to be sure it is stable off of linezolid.

## 2019-03-31 NOTE — Assessment & Plan Note (Signed)
On appropriate treatment and tolerating now.  She will follow up with Dr. Tommy Medal in 1 month.

## 2019-03-31 NOTE — Progress Notes (Signed)
   Subjective:    Patient ID: Catherine [name], female    DOB: [birth year], 73 y.o.   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 She has a history of inguinal drainage and above infection that was near previous mesh placement and on therapy with clofazimine and moxifloxacin.  She was seen in consultation as well by Dr. Lorenda Cahill at [facility] who helped formulate the plan.  There was concern of involvement of the mesh and this was removed and thoroughly I and Ded by Dr. Johney Maine last month.  She had been off of antibiotics about 6 weeks prior to the surgery.  Growth in the cultures sent only revealed rare Proprionibacterium acnes in 1 culture.  She otherwise is tolerating the medications fine with no associated rash, diarrhea.  Today she received a 100 dose pack for the next 3 months from our pharmacy team.    She previously had issues with linezolid which was stopped due to anemia which has resolved.    She also has a known ascending thoracic aortic aneurysm 3.9x3.8 cm.  She understands that moxifloxacin has a known side effect of potential exacerbation of this.       Her wound is otherwise healing well.    Review of Systems  Constitutional: Positive for fatigue. Negative for chills and fever.  Gastrointestinal: Negative for diarrhea and nausea.  Hematological: Negative for adenopathy.       Objective:   Physical Exam Constitutional:      Appearance: Normal appearance.  Eyes:     General: No scleral icterus. Cardiovascular:     Rate and Rhythm: Normal rate and regular rhythm.     Heart sounds: No murmur.  Pulmonary:     Effort: Pulmonary effort is normal. No respiratory distress.  Genitourinary:    Comments: Right groin with open incision area that has good looking tissue, no surrounding erythema, no drainage Skin:    Findings: No rash.  Neurological:     General: No focal deficit present.     Mental Status: She is alert.  Psychiatric:        Mood and  Affect: Mood normal.   SH: former smoker        Assessment & Plan:

## 2019-04-14 LAB — ACID FAST CULTURE WITH REFLEXED SENSITIVITIES (MYCOBACTERIA)
Acid Fast Culture: NEGATIVE
Acid Fast Culture: NEGATIVE

## 2019-04-15 ENCOUNTER — Other Ambulatory Visit: Payer: Self-pay | Admitting: Surgery

## 2019-04-15 DIAGNOSIS — R222 Localized swelling, mass and lump, trunk: Secondary | ICD-10-CM

## 2019-04-19 ENCOUNTER — Ambulatory Visit
Admission: RE | Admit: 2019-04-19 | Discharge: 2019-04-19 | Disposition: A | Discharge status: Home / Self Care | Payer: PPO | Source: Ambulatory Visit | Attending: Surgery | Admitting: Surgery

## 2019-04-19 DIAGNOSIS — K5730 Diverticulosis of large intestine without perforation or abscess without bleeding: Secondary | ICD-10-CM | POA: Diagnosis not present

## 2019-04-19 DIAGNOSIS — R222 Localized swelling, mass and lump, trunk: Secondary | ICD-10-CM

## 2019-04-19 MED ORDER — IOPAMIDOL (ISOVUE-300) INJECTION 61%
100.0000 mL | Freq: Once | INTRAVENOUS | Status: AC | PRN
  Administered 2019-04-19: 100 mL via INTRAVENOUS

## 2019-04-20 ENCOUNTER — Other Ambulatory Visit: Payer: Self-pay | Admitting: Radiology

## 2019-04-20 ENCOUNTER — Ambulatory Visit (HOSPITAL_COMMUNITY)
Admission: RE | Admit: 2019-04-20 | Discharge: 2019-04-20 | Disposition: A | Discharge status: Home / Self Care | Payer: PPO | Source: Ambulatory Visit | Attending: Interventional Radiology | Admitting: Interventional Radiology

## 2019-04-20 ENCOUNTER — Other Ambulatory Visit: Payer: Self-pay

## 2019-04-20 ENCOUNTER — Other Ambulatory Visit (HOSPITAL_COMMUNITY): Payer: Self-pay | Admitting: Interventional Radiology

## 2019-04-20 DIAGNOSIS — R1903 Right lower quadrant abdominal swelling, mass and lump: Secondary | ICD-10-CM

## 2019-04-20 DIAGNOSIS — Z87891 Personal history of nicotine dependence: Secondary | ICD-10-CM | POA: Insufficient documentation

## 2019-04-20 DIAGNOSIS — J449 Chronic obstructive pulmonary disease, unspecified: Secondary | ICD-10-CM | POA: Diagnosis not present

## 2019-04-20 DIAGNOSIS — I712 Thoracic aortic aneurysm, without rupture: Secondary | ICD-10-CM | POA: Diagnosis not present

## 2019-04-20 DIAGNOSIS — Z79899 Other long term (current) drug therapy: Secondary | ICD-10-CM | POA: Diagnosis not present

## 2019-04-20 DIAGNOSIS — K651 Peritoneal abscess: Secondary | ICD-10-CM | POA: Insufficient documentation

## 2019-04-20 DIAGNOSIS — T8143XA Infection following a procedure, organ and space surgical site, initial encounter: Secondary | ICD-10-CM | POA: Diagnosis not present

## 2019-04-20 DIAGNOSIS — X58XXXD Exposure to other specified factors, subsequent encounter: Secondary | ICD-10-CM | POA: Diagnosis not present

## 2019-04-20 DIAGNOSIS — I10 Essential (primary) hypertension: Secondary | ICD-10-CM | POA: Diagnosis not present

## 2019-04-20 DIAGNOSIS — K219 Gastro-esophageal reflux disease without esophagitis: Secondary | ICD-10-CM | POA: Insufficient documentation

## 2019-04-20 DIAGNOSIS — T8149XD Infection following a procedure, other surgical site, subsequent encounter: Secondary | ICD-10-CM | POA: Diagnosis not present

## 2019-04-20 LAB — CBC
HCT: 37.7 % (ref 36.0–46.0)
Hemoglobin: 12.1 g/dL (ref 12.0–15.0)
MCH: 28.7 pg (ref 26.0–34.0)
MCHC: 32.1 g/dL (ref 30.0–36.0)
MCV: 89.3 fL (ref 80.0–100.0)
Platelets: 252 10*3/uL (ref 150–400)
RBC: 4.22 MIL/uL (ref 3.87–5.11)
RDW: 13.2 % (ref 11.5–15.5)
WBC: 7.1 10*3/uL (ref 4.0–10.5)
nRBC: 0 % (ref 0.0–0.2)

## 2019-04-20 LAB — PROTIME-INR
INR: 1 (ref 0.8–1.2)
Prothrombin Time: 13 seconds (ref 11.4–15.2)

## 2019-04-20 MED ORDER — LIDOCAINE HCL 1 % IJ SOLN
INTRAMUSCULAR | Status: AC
  Filled 2019-04-20: qty 20

## 2019-04-20 MED ORDER — MIDAZOLAM HCL 2 MG/2ML IJ SOLN
INTRAMUSCULAR | Status: AC | PRN
  Administered 2019-04-20 (×2): 0.5 mg via INTRAVENOUS
  Administered 2019-04-20: 1 mg via INTRAVENOUS

## 2019-04-20 MED ORDER — MIDAZOLAM HCL 2 MG/2ML IJ SOLN
INTRAMUSCULAR | Status: AC
  Filled 2019-04-20: qty 2

## 2019-04-20 MED ORDER — FENTANYL CITRATE (PF) 100 MCG/2ML IJ SOLN
INTRAMUSCULAR | Status: AC | PRN
  Administered 2019-04-20: 50 ug via INTRAVENOUS
  Administered 2019-04-20 (×2): 25 ug via INTRAVENOUS

## 2019-04-20 MED ORDER — SODIUM CHLORIDE 0.9% FLUSH: 5.0000 mL | Freq: Three times a day (TID) | INTRAVENOUS | Status: DC

## 2019-04-20 MED ORDER — FENTANYL CITRATE (PF) 100 MCG/2ML IJ SOLN
INTRAMUSCULAR | Status: AC
  Filled 2019-04-20: qty 2

## 2019-04-20 NOTE — Procedures (Signed)
Interventional Radiology Procedure Note  Procedure: Abdominal drain placement  Complications: None immediate  Estimated Blood Loss: <58ml  Recommendations: Flush catheter with 10cc of sterile saline daily. Record outputs. Call IR when outputs have decreased to <15-20cc/day x 2 days.   Signed,  Constance Holster, MD

## 2019-04-20 NOTE — H&P (Signed)
Chief Complaint: Patient was seen in consultation today for intra-abdominal fluid collection  Referring Physician(s): Hassell,Daniel  Supervising Physician: Arne Cleveland  Patient Status: [facility] - Out-pt  History of Present Illness: Catherine [name] is a 73 y.o. female with extensive past medical history listed below significant for inguinal hernia repair with mesh in 01/2018 who has had several fluid/abscess collections requiring excision over the past year. She has been followed by ID at [facility] as well as with Dr. Tommy Medal. Most recently she underwent removal of mesh due to persistent infection.  Since surgery 02/28/2019 she has had abdominal pain and distention which was thought to be a post-operative hematoma.  She was seen by Dr. Johney Maine yesterday and sent for imaging due to persistence as well as history of multiple infections.    CT showed: 1. Postoperative findings about the right groin in keeping with history of hernia mesh removal. There is a fluid collection about the right lower quadrant, overlying the right iliac vessels, abutting the inguinal ring and right bladder dome, measuring 8.0 x 6.3 cm (series 2, image 69). This is most likely a postoperative hematoma or seroma.  IR consulted for aspiration and drainage of intra-abdominal fluid collection.  She has been NPO today.  She does not take blood thinners.  She is currently on Zyvox and Avelox with Dr. Tommy Medal.    Past Medical History:  Diagnosis Date  . Anxiety   . Aortic aneurysm ([place]) 12/08/2018  . Arthritis    HANDS  . Chronic constipation   . Complication of anesthesia    upon waking up from anesthesia patient experienced chest pain - EKG done was negative, patient states "it may have been indigestion"  . COPD (chronic obstructive pulmonary disease) ([place])   . DDD (degenerative disc disease), lumbar    L2-3/3-4/4-5  . Dizziness 01/17/2019  . DOE (dyspnea on exertion) 01/17/2019  . Dysuria   . Frequency  of urination   . Ganglion cyst of right groin 11/23/2018  . GERD (gastroesophageal reflux disease)   . Hearing aid worn   . Hypertension   . Infected hernioplasty mesh ([place]) 01/26/2019  . Migraine   . Mild emphysema ([place])   . 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 Osteopenia 2019   TO OSTEOPOROSIS   . Pelvic pain in female   . Smokers' cough ([place])   . Thoracic aortic aneurysm (TAA) ([facility])    FOLLOWED BY DR BARTLE  LAST CT CHEST  WAS 6-3 SEE EPIC   . Urgency of urination    h/o INTERSTIAL CYSTITIS...DR. HUMPHRIES/GRAPEY  . Wears glasses     Past Surgical History:  Procedure Laterality Date  . ABDOMINAL HYSTERECTOMY  1982   W/  UNILATERAL SALPINGOOPHORECTOMY  . bilateral hernia surgery      removed ganglion cyst and swollen lymph node   . CHOLECYSTECTOMY OPEN  1987   W/  APPENDECTOMY  . COLONOSCOPY  2002  &  12-2012 & 03/19/15  . CYSTO WITH HYDRODISTENSION N/A 02/20/2014   Procedure: [place] AND MARCAINE INSTILLATION;  Surgeon: Bernestine Amass, MD;  Location: [facility];  Service: Urology;  Laterality: N/A;  . EXCISION MASS LOWER EXTREMETIES Right 11/02/2018   Procedure: REMOVAL OF RIGHT GROIN AND THIGH SUBCUTANEOUS MASSES.;  Surgeon: Michael Boston, MD;  Location: [place];  Service: General;  Laterality: Right;  . EYE SURGERY     bilateral cataract surgery   . GROIN DISSECTION N/A 02/28/2019   Procedure: GROIN EXPLORATION AND  DEBRIDEMENT;  Surgeon: Michael Boston, MD;  Location: [facility];  Service: General;  Laterality: N/A;  . INCISION AND DRAINAGE ABSCESS N/A 12/09/2018   Procedure: INCISION AND DRAINAGE RIGHT GROIN and thigh  WITH DEBRIDEMENT;  Surgeon: Michael Boston, MD;  Location: [facility];  Service: General;  Laterality: N/A;  . LAPAROSCOPIC ABDOMINAL EXPLORATION Right 02/28/2019   Procedure: LAPAROSCOPIC EXPLORATION WITH REMOVAL OF RIGHT PREPERITONEAL MESH;  Surgeon: Michael Boston, MD;  Location: [facility];  Service: General;  Laterality:  Right;  . LAPAROSCOPIC LYSIS OF ADHESIONS Bilateral 02/28/2019   Procedure: LAPAROSCOPIC LYSIS OF ADHESIONS;  Surgeon: Michael Boston, MD;  Location: [facility];  Service: General;  Laterality: Bilateral;  . LAPAROSCOPIC UNILATERAL SALPINGOOPHORECTOMY  1984  . LESION REMOVAL Right 02/28/2019   Procedure: EXCISION VAGINAL ABSCESS  AND DISSECTION;  Surgeon: Michael Boston, MD;  Location: [facility];  Service: General;  Laterality: Right;  . MASS EXCISION Right 06/16/2018   Procedure: REMOVAL OF RIGHT GROIN SUBCUTANEOUS MASS ERAS PATHWAY;  Surgeon: Michael Boston, MD;  Location: [facility];  Service: General;  Laterality: Right;  . removal of lymph node cyst in right groin     . ROTATOR CUFF REPAIR Right 2003   and BONE SPUR    Allergies: Ciprofloxacin hcl, Metoprolol, Penicillins, Oxycodone, Advair diskus [fluticasone-salmeterol], and Sulfa antibiotics  Medications: Prior to Admission medications   Medication Sig Start Date End Date Taking? Authorizing Provider  acetaminophen (TYLENOL) 500 MG tablet Take 1,000 mg by mouth 2 (two) times a day.    Yes [provider]  albuterol (PROVENTIL HFA;VENTOLIN HFA) 108 (90 Base) MCG/ACT inhaler Inhale 2 puffs into the lungs every 4 (four) hours as needed for wheezing or shortness of breath.   Yes [provider]  AMBULATORY NON FORMULARY MEDICATION Take 2 capsules by mouth daily. Medication Name: clofazimine 12/08/18  Yes Tommy Medal, Lavell Islam, MD  budesonide-formoterol [facility]) 160-4.5 MCG/ACT inhaler Inhale 2 puffs into the lungs 2 (two) times daily.   Yes [provider]  CALCIUM CARBONATE ANTACID PO Take 1,000 mg by mouth daily.    Yes [provider]  carboxymethylcellulose (REFRESH PLUS) 0.5 % SOLN Place 2 drops into both eyes 3 (three) times daily.    Yes [provider]  Carboxymethylcellulose Sodium (REFRESH LIQUIGEL) 1 % GEL Place 1 drop into both eyes at bedtime.   Yes [provider]  Cholecalciferol  (VITAMIN D3) 5000 units CAPS Take 5,000 Units by mouth daily.    Yes [provider]  Cyanocobalamin (VITAMIN B-12 PO) Take 2,000 mcg by mouth daily.    Yes [provider]  docusate sodium (COLACE) 100 MG capsule Take 200 mg by mouth daily.    Yes [provider]  esomeprazole (NEXIUM) 20 MG capsule Take 20 mg by mouth daily.   Yes [provider]  fluticasone (FLONASE) 50 MCG/ACT nasal spray Place 1 spray into both nostrils daily.    Yes [provider]  gabapentin (NEURONTIN) 100 MG capsule Take 100 mg by mouth at bedtime.    Yes [provider]  HYDROmorphone (DILAUDID) 2 MG tablet Take 2 mg by mouth every 6 (six) hours as needed for severe pain.   Yes [provider]  LORazepam (ATIVAN) 1 MG tablet Take 0.5-1 mg by mouth See admin instructions. Take 0.5 mg by mouth in the morning and take 1 mg by mouth at bedtime   Yes [provider]  moxifloxacin (AVELOX) 400 MG tablet Take 1 tablet (400 mg total) by  mouth daily at 8 pm. 02/16/19  Yes Tommy Medal, Lavell Islam, MD  polyethylene glycol [facility] Floria Raveling) packet Take 17 g by mouth daily.    Yes [provider]  SUMAtriptan (IMITREX) 50 MG tablet Take 50 mg by mouth every 2 (two) hours as needed for migraine. May repeat in 2 hours if headache persists or recurs.   Yes [provider]  tiotropium (SPIRIVA HANDIHALER) 18 MCG inhalation capsule Place 18 mcg into inhaler and inhale daily.    Yes [provider]  traMADol (ULTRAM) 50 MG tablet Take 50 mg by mouth every 6 (six) hours as needed for moderate pain.   Yes [provider]  denosumab (PROLIA) 60 MG/ML SOSY injection Inject 60 mg into the skin every 6 (six) months.    [provider]  diltiazem (TAZTIA XT) 240 MG 24 hr capsule Take 240 mg by mouth daily.     [provider]  linezolid (ZYVOX) 600 MG tablet Take 1 tablet (600 mg total) by mouth 2 (two) times daily. Patient  not taking: Reported on 01/18/2019 12/08/18   Tommy Medal, Lavell Islam, MD  naproxen sodium (ALEVE) 220 MG tablet Take 2 tablets (440 mg total) by mouth every morning. 12/10/18   Antonieta Pert, MD     Family History  Problem Relation Age of Onset  . Esophageal cancer Mother 50  . Lung cancer Father 29    Social History   Socioeconomic History  . Marital status: Married    Spouse name: Not on file  . Number of children: Not on file  . Years of education: Not on file  . Highest education level: Not on file  Occupational History  . Not on file  Social Needs  . Financial resource strain: Not on file  . Food insecurity    Worry: Not on file    Inability: Not on file  . Transportation needs    Medical: Not on file    Non-medical: Not on file  Tobacco Use  . Smoking status: Former Smoker    Packs/day: 0.75    Years: 48.00    Pack years: 36.00    Types: Cigarettes    Quit date: 09/04/2017    Years since quitting: 1.6  . Smokeless tobacco: Never Used  Substance and Sexual Activity  . Alcohol use: No  . Drug use: No  . Sexual activity: Not on file  Lifestyle  . Physical activity    Days per week: Not on file    Minutes per session: Not on file  . Stress: Not on file  Relationships  . Social Herbalist on phone: Not on file    Gets together: Not on file    Attends religious service: Not on file    Active member of club or organization: Not on file    Attends meetings of clubs or organizations: Not on file    Relationship status: Not on file  Other Topics Concern  . Not on file  Social History Narrative  . Not on file     Review of Systems: A 12 point ROS discussed and pertinent positives are indicated in the HPI above.  All other systems are negative.  Review of Systems  Constitutional: Negative for fatigue and fever.  Respiratory: Negative for cough and shortness of breath.   Cardiovascular: Negative for chest pain.  Gastrointestinal: Positive for abdominal  distention and abdominal pain. Negative for nausea and vomiting.  Genitourinary: Negative for dysuria.  Musculoskeletal: Negative for back pain.  Psychiatric/Behavioral: Negative for behavioral problems and confusion.    Vital Signs: BP 131/73 (BP Location: Right Arm)   Pulse 79   Temp (!) 97.5 F (36.4 C)   Resp 20   Ht 5\' 10"  (1.778 m)   Wt 164 lb (74.4 kg)   SpO2 99%   BMI 23.53 kg/m   Physical Exam Vitals signs and nursing note reviewed.  Constitutional:      Appearance: Normal appearance.  Cardiovascular:     Rate and Rhythm: Normal rate and regular rhythm.     Pulses: Normal pulses.  Pulmonary:     Effort: Pulmonary effort is normal.     Breath sounds: Normal breath sounds.  Abdominal:     General: There is distension.     Tenderness: There is abdominal tenderness.     Comments: Palpable area of discrete hardness in pelvis into R lower abdomen.   Neurological:     General: No focal deficit present.     Mental Status: She is alert and oriented to person, place, and time. Mental status is at baseline.  Psychiatric:        Mood and Affect: Mood normal.        Behavior: Behavior normal.        Thought Content: Thought content normal.        Judgment: Judgment normal.      MD Evaluation Airway: WNL Heart: WNL Abdomen: WNL Chest/ Lungs: WNL ASA  Classification: 3 Mallampati/Airway Score: One   Imaging: Ct Abdomen Pelvis W Contrast  Result Date: 04/19/2019 CLINICAL DATA:  Right right lower quadrant abdominal wall mass, hernia mesh removal 02/27/2019 EXAM: CT ABDOMEN AND PELVIS WITH CONTRAST TECHNIQUE: Multidetector CT imaging of the abdomen and pelvis was performed using the standard protocol following bolus administration of intravenous contrast. CONTRAST:  157mL ISOVUE-300 IOPAMIDOL (ISOVUE-300) INJECTION 61%, additional oral enteric contrast COMPARISON:  11/26/2018 FINDINGS: Lower chest: No acute abnormality. Hepatobiliary: No focal liver abnormality is seen.  Status post cholecystectomy. Postoperative biliary dilatation. Pancreas: Unremarkable. No pancreatic ductal dilatation or surrounding inflammatory changes. Spleen: Normal in size without significant abnormality. Adrenals/Urinary Tract: Adrenal glands are unremarkable. Kidneys are normal, without renal calculi, solid lesion, or hydronephrosis. Bladder is unremarkable. Stomach/Bowel: Stomach is within normal limits. Appendix appears normal. No evidence of bowel wall thickening, distention, or inflammatory changes. Sigmoid diverticulosis. Moderate burden of stool in the colon. Vascular/Lymphatic: Aortic atherosclerosis. No enlarged abdominal or pelvic lymph nodes. Reproductive: Status post hysterectomy. Other: Postoperative findings about the right groin in keeping with history of hernia mesh removal. There is a fluid collection about the right lower quadrant, overlying the right iliac vessels, abutting the inguinal ring and right bladder dome, measuring 8.0 x 6.3 cm (series 2, image 69). No abdominopelvic ascites. Musculoskeletal: No acute or significant osseous findings. IMPRESSION: 1. Postoperative findings about the right groin in keeping with history of hernia mesh removal. There is a fluid collection about the right lower quadrant, overlying the right iliac vessels, abutting the inguinal ring and right bladder dome, measuring 8.0 x 6.3 cm (series 2, image 69). This is most likely a postoperative hematoma or seroma. 2. Other chronic, incidental, and postoperative findings as detailed above. Aortic Atherosclerosis (ICD10-I70.0). Electronically Signed   By: Eddie Candle M.D.   On: 04/19/2019 15:37    Labs:  CBC: Recent Labs    01/18/19 1124 02/24/19 1146 03/30/19 1146 04/20/19 1309  WBC 6.6 7.1 5.3 7.1  HGB 10.0* 12.8 12.2 12.1  HCT 30.9* 41.4 36.2 37.7  PLT 160 244 227 252    COAGS: Recent Labs    04/20/19 1309  INR 1.0    BMP: Recent Labs    01/17/19 1458 01/18/19 1124 02/24/19 1146  03/30/19 1146  NA 135 139 138 139  K 4.1 3.9 4.2 4.0  CL 105 107 107 107  CO2 21* 18* 24 25  GLUCOSE 98 97 98 96  BUN 19 20 15 19   CALCIUM 8.9 9.0 9.0 8.9  CREATININE 1.13* 1.02* 0.83 1.07*  GFRNONAA 48* 55* >60 51*  GFRAA 56* >60 >60 60    LIVER FUNCTION TESTS: Recent Labs    12/08/18 1735 01/17/19 1458 03/30/19 1146  BILITOT 0.2* 0.4 0.3  AST 26 13* 13  ALT 28 16 8   ALKPHOS 93 42  --   PROT 6.9 5.9* 6.1  ALBUMIN 3.7 3.9  --     TUMOR MARKERS: No results for input(s): AFPTM, CEA, CA199, CHROMGRNA in the last 8760 hours.  Assessment and Plan: Patient with history of multiple surgeries and infections presents with complaint of new intra-abdominal fluid collection s/p mesh removal 02/2019.  IR consulted for aspiration and drainage at the request of Dr. Johney Maine. Case reviewed by Dr. Vernard Gambles who approves patient for procedure.  Patient presents today in their usual state of health.  She has been NPO and is not currently on blood thinners.  She is on antibiotics. Patient is aware that the plan is to send her home after procedure today with outpatient follow up, however that if she develops concerns for infection during recovery, she may be admitted for monitoring.  Risks and benefits discussed with the patient including bleeding, infection, damage to adjacent structures, bowel perforation/fistula connection, and sepsis.  All of the patient's questions were answered, patient is agreeable to proceed. Consent signed and in chart.    Thank you for this interesting consult.  I greatly enjoyed meeting Catherine [name] and look forward to participating in their care.  A copy of this report was sent to the requesting provider on this date.  Electronically Signed: Docia Barrier, PA 04/20/2019, 2:13 PM   I spent a total of  30 Minutes   in face to face in clinical consultation, greater than 50% of which was counseling/coordinating care for intra-abdominal fluid collection.

## 2019-04-20 NOTE — Discharge Instructions (Signed)
Moderate Conscious Sedation, Adult, Care After These instructions provide you with information about caring for yourself after your procedure. Your health care provider may also give you more specific instructions. Your treatment has been planned according to current medical practices, but problems sometimes occur. Call your health care provider if you have any problems or questions after your procedure. What can I expect after the procedure? After your procedure, it is common:  To feel sleepy for several hours.  To feel clumsy and have poor balance for several hours.  To have poor judgment for several hours.  To vomit if you eat too soon. Follow these instructions at home: For at least 24 hours after the procedure:   Do not: ? Participate in activities where you could fall or become injured. ? Drive. ? Use heavy machinery. ? Drink alcohol. ? Take sleeping pills or medicines that cause drowsiness. ? Make important decisions or sign legal documents. ? Take care of children on your own.  Rest. Eating and drinking  Follow the diet recommended by your health care provider.  If you vomit: ? Drink water, juice, or soup when you can drink without vomiting. ? Make sure you have little or no nausea before eating solid foods. General instructions  Have a responsible adult stay with you until you are awake and alert.  Take over-the-counter and prescription medicines only as told by your health care provider.  If you smoke, do not smoke without supervision.  Keep all follow-up visits as told by your health care provider. This is important. Contact a health care provider if:  You keep feeling nauseous or you keep vomiting.  You feel light-headed.  You develop a rash.  You have a fever. Get help right away if:  You have trouble breathing. This information is not intended to replace advice given to you by your health care provider. Make sure you discuss any questions you have  with your health care provider. Document Released: 05/11/2013 Document Revised: 07/03/2017 Document Reviewed: 11/10/2015 Elsevier Patient Education  [place].    Percutaneous Abscess Drain, Care After This sheet gives you information about how to care for yourself after your procedure. Your health care provider may also give you more specific instructions. If you have problems or questions, contact your health care provider. What can I expect after the procedure? After your procedure, it is common to have:  A small amount of bruising and discomfort in the area where the drainage tube (catheter) was placed.  Sleepiness and fatigue. This should go away after the medicines you were given have worn off. Follow these instructions at home: Incision care  Follow instructions from your health care provider about how to take care of your incision. Make sure you: ? Wash your hands with soap and water before you change your bandage (dressing). If soap and water are not available, use hand sanitizer. ? Change your dressing as told by your health care provider. ? Leave stitches (sutures), skin glue, or adhesive strips in place. These skin closures may need to stay in place for 2 weeks or longer. If adhesive strip edges start to loosen and curl up, you may trim the loose edges. Do not remove adhesive strips completely unless your health care provider tells you to do that.  Check your incision area every day for signs of infection. Check for: ? More redness, swelling, or pain. ? More fluid or blood. ? Warmth. ? Pus or a bad smell. ? Fluid leaking from around  your catheter (instead of fluid draining through your catheter). Catheter care   Follow instructions from your health care provider about emptying and cleaning your catheter and collection bag. You may need to clean the catheter every day so it does not clog.  If directed, write down the following information every time you empty your  bag: ? The date and time. ? The amount of drainage. General instructions  Rest at home for 1-2 days after your procedure. Return to your normal activities as told by your health care provider.  Do not take baths, swim, or use a hot tub for 24 hours after your procedure, or until your health care provider says that this is okay.  Take over-the-counter and prescription medicines only as told by your health care provider.  Keep all follow-up visits as told by your health care provider. This is important. Contact a health care provider if:  You have less than 10 mL of drainage a day for 2-3 days in a row, or as directed by your health care provider.  You have more redness, swelling, or pain around your incision area.  You have more fluid or blood coming from your incision area.  Your incision area feels warm to the touch.  You have pus or a bad smell coming from your incision area.  You have fluid leaking from around your catheter (instead of through your catheter).  You have a fever or chills.  You have pain that does not get better with medicine. Get help right away if:  Your catheter comes out.  You suddenly stop having drainage from your catheter.  You suddenly have blood in the fluid that is draining from your catheter.  You become dizzy or you faint.  You develop a rash.  You have nausea or vomiting.  You have difficulty breathing or you feel short of breath.  You develop chest pain.  You have problems with your speech or vision.  You have trouble balancing or moving your arms or legs. Summary  It is common to have a small amount of bruising and discomfort in the area where the drainage tube (catheter) was placed.  You may be directed to record the amount of drainage from the bag every time you empty it.  Follow instructions from your health care provider about emptying and cleaning your catheter and collection bag. This information is not intended to  replace advice given to you by your health care provider. Make sure you discuss any questions you have with your health care provider. Document Released: 12/05/2013 Document Revised: 07/03/2017 Document Reviewed: 06/12/2016 Elsevier Patient Education  2020 [employer].

## 2019-04-20 NOTE — Sedation Documentation (Signed)
PIV 20g LAC, dsg CDI, IV patent, flushed with 10cc NS, and saline locked.

## 2019-04-21 ENCOUNTER — Ambulatory Visit (HOSPITAL_COMMUNITY): Admission: RE | Admit: 2019-04-21 | Payer: PPO | Source: Ambulatory Visit

## 2019-04-25 LAB — AEROBIC/ANAEROBIC CULTURE W GRAM STAIN (SURGICAL/DEEP WOUND): Culture: NO GROWTH

## 2019-04-27 ENCOUNTER — Encounter: Payer: Self-pay | Admitting: Infectious Disease

## 2019-04-27 ENCOUNTER — Other Ambulatory Visit: Payer: Self-pay

## 2019-04-27 ENCOUNTER — Ambulatory Visit: Payer: PPO | Admitting: Infectious Disease

## 2019-04-27 VITALS — BP 124/78 | HR 93 | Temp 98.5°F

## 2019-04-27 DIAGNOSIS — L089 Local infection of the skin and subcutaneous tissue, unspecified: Secondary | ICD-10-CM

## 2019-04-27 DIAGNOSIS — I889 Nonspecific lymphadenitis, unspecified: Secondary | ICD-10-CM

## 2019-04-27 DIAGNOSIS — L02214 Cutaneous abscess of groin: Secondary | ICD-10-CM | POA: Diagnosis not present

## 2019-04-27 DIAGNOSIS — T8579XD Infection and inflammatory reaction due to other internal prosthetic devices, implants and grafts, subsequent encounter: Secondary | ICD-10-CM | POA: Diagnosis not present

## 2019-04-27 DIAGNOSIS — I712 Thoracic aortic aneurysm, without rupture, unspecified: Secondary | ICD-10-CM

## 2019-04-27 DIAGNOSIS — T792XXA Traumatic secondary and recurrent hemorrhage and seroma, initial encounter: Secondary | ICD-10-CM | POA: Diagnosis not present

## 2019-04-27 DIAGNOSIS — A318 Other mycobacterial infections: Secondary | ICD-10-CM

## 2019-04-27 HISTORY — DX: Traumatic secondary and recurrent hemorrhage and seroma, initial encounter: T79.2XXA

## 2019-04-27 NOTE — Progress Notes (Signed)
Complaint: Persistent high output of serous fluid from her abdominal seroma that was drained by interventional radiology, also complaining of a painful area in her thigh that is been present since surgery   Subjective:    Patient ID: Catherine [name], female    DOB: Mar 16, 1946, 73 y.o.   MRN: [id number]  HPI  Catherine [name] is a quite pleasant 73 year old Caucasian female with a past medical history examined for thoracic aneurysm, anxiety, social cystitis who in 2000 1617 developed a cystic lesion in her right groin.  She had this excised by general surgery on June 17 and pathology came back consistent with a lymphangioma and benign lymph nodes.  She then later underwent excision and repair of a pelvic mass which turned out on pathology be consistent with a synovial cyst, and also underwent bilateral femoral hernia repair and left inguinal hernia repair in June 2019.  In November 2019 she developed a proximal thigh mass which was excised and also found to be consistent with a synovial cyst and benign lymph nodes.  She had worsening swelling in her right groin and underwent incision and drainage of what appeared to be a seroma on September 20, 2018.  Postoperatively in the ensuing week she had worsening erythema and swelling and drainage.  She also developed overt fevers and malaise and was started on doxycycline with resolution of her fevers and malaise.  However just prior to surgery she was having worsening erythema in this area and started on doxycycline without any improvement in her symptoms.  She underwent right groin and inner thigh exploration with excision of subcutaneous lymph nodes on November 02, 2018 in the OR there was some yellow cheesy material found consistent with necrosis.  Pathology report showed necrotizing granulomatous inflammation in the soft tissue as well as the 3 lymph nodes that were excised.  Stains were negative but cultures have subsequently grown nontuberculous mycobacteria and  ensuing few weeks.  This nontuberculous mycobacteria was probe not just for TB but M gordonae I and M avium and kansasii I and none of these organisms were present.  Saw her in clinic we do not yet have the identity of the organism.  We held off on initiating antimicrobial therapy.  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  I   I felt strongly that she needed surgery with an I&D and she was admitted to [facility] long and underwent formal incision and debridement by Dr. Johney Maine in May of 2020. .  Cultures yielded MRSA as well as Mycobacterium fortuitum now showing intermediate susceptibility to Zyvox.  Along the way she  Was having dyspnea and some chest discomfort since being on Zyvox.  She has some dizziness with standing.  I was concerned that she might have bone marrow toxicity from Zyvox and anemia that might explain her symptoms.  She was indeed orthostatic on checking her blood pressure from lying to standing.  However stat labs that were done and came back show her to be anemic but not sufficiently anemic to explain her symptoms.  We stopped her Zyvox and clofazimine.  Ended up going to the emergency department getting a CT angiogram that ruled out pulmonary embolism.  Since stopping the Zyvox and the clofazimine she felt much better.  Given the complicated nature of her case and in particular the difficulty navigating antibiotics I reached out to Dr. Brigitte Pulse at [facility].  Dr. Lorenda Cahill felt strongly that this was a case that needed surgical reconsideration and certainly  1 that he needed to look over in person.  She therefore traveled to [facility] on Monday and was seen by Dr. Lorenda Cahill.  Dr. Lorenda Cahill came to the conclusion after thoroughly reviewing the radiographs and the laboratory data susceptibility patterns and clinical case and feels very strongly that the patient very likely has infection of her mesh and that the mesh will need  to be removed to affect cure.  Certainly if the mesh is involved and I have also had anxiety about this there is no way wanted to cure his infection without eradication of this material.  Furthermore as Dr. Lorenda Cahill points out the antimicrobials that we are having to use against this organism are not without trivial risk to this patient.  Catherine [name] is now status post surgery on July 27 by Dr. gross with removal of infected mesh.  We have not recovered further Mycobacterium since that surgery.  She was then initiated on clofazimine and Avelox per Dr. Romie Levee recommendations.  She has been feeling much much better.  She did develop intra-abdominal fluid collection which was drained by interventional radiology on 16 September  and there is still a drain in place.  It is serous in nature and cultures that were sent for routine bacteria did not yield any organisms.  He is having a large amount of drainage from there which is increased over the last few days and she is a bit puzzled by that.  She also has an area in her right groin where she has some tenderness and feels some firmness.       Past Medical History:  Diagnosis Date   Anxiety    Aortic aneurysm ([place]) 12/08/2018   Arthritis    HANDS   Chronic constipation    Complication of anesthesia    upon waking up from anesthesia patient experienced chest pain - EKG done was negative, patient states "it may have been indigestion"   COPD (chronic obstructive pulmonary disease) (HCC)    DDD (degenerative disc disease), lumbar    L2-3/3-4/4-5   Dizziness 01/17/2019   DOE (dyspnea on exertion) 01/17/2019   Dysuria    Frequency of urination    Ganglion cyst of right groin 11/23/2018   GERD (gastroesophageal reflux disease)    Hearing aid worn    Hypertension    Infected hernioplasty mesh ([place]) 01/26/2019   Migraine    Mild emphysema ([place])    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 HUMPHRIES/GRAPEY   Wears glasses     Past Surgical History:  Procedure Laterality Date   ABDOMINAL HYSTERECTOMY  1982   W/  UNILATERAL SALPINGOOPHORECTOMY   bilateral hernia surgery      removed ganglion cyst and swollen lymph node    CHOLECYSTECTOMY OPEN  1987   W/  APPENDECTOMY   COLONOSCOPY  2002  &  12-2012 & 03/19/15   CYSTO WITH HYDRODISTENSION N/A 02/20/2014   Procedure: [place] AND MARCAINE INSTILLATION;  Surgeon: Bernestine Amass, MD;  Location: [facility];  Service: Urology;  Laterality: N/A;   EXCISION MASS LOWER EXTREMETIES Right 11/02/2018   Procedure: REMOVAL OF RIGHT GROIN AND THIGH SUBCUTANEOUS MASSES.;  Surgeon:  Michael Boston, MD;  Location: [facility];  Service: General;  Laterality: Right;   EYE SURGERY     bilateral cataract surgery    GROIN DISSECTION N/A 02/28/2019   Procedure: GROIN EXPLORATION AND DEBRIDEMENT;  Surgeon: Michael Boston, MD;  Location: [facility];  Service: General;  Laterality: N/A;   INCISION AND DRAINAGE ABSCESS N/A 12/09/2018   Procedure: INCISION AND DRAINAGE RIGHT GROIN and thigh  WITH DEBRIDEMENT;  Surgeon: Michael Boston, MD;  Location: [facility];  Service: General;  Laterality: N/A;   LAPAROSCOPIC ABDOMINAL EXPLORATION Right 02/28/2019   Procedure: LAPAROSCOPIC EXPLORATION WITH REMOVAL OF RIGHT PREPERITONEAL MESH;  Surgeon: Michael Boston, MD;  Location: [facility];  Service: General;  Laterality: Right;   LAPAROSCOPIC LYSIS OF ADHESIONS Bilateral 02/28/2019   Procedure: LAPAROSCOPIC LYSIS OF ADHESIONS;  Surgeon: Michael Boston, MD;  Location: [facility];  Service: General;  Laterality: Bilateral;   LAPAROSCOPIC UNILATERAL SALPINGOOPHORECTOMY  1984   LESION REMOVAL Right 02/28/2019   Procedure: EXCISION  VAGINAL ABSCESS  AND DISSECTION;  Surgeon: Michael Boston, MD;  Location: [facility];  Service: General;  Laterality: Right;   MASS EXCISION Right 06/16/2018   Procedure: REMOVAL OF RIGHT GROIN SUBCUTANEOUS MASS ERAS PATHWAY;  Surgeon: Michael Boston, MD;  Location: [facility];  Service: General;  Laterality: Right;   removal of lymph node cyst in right groin      ROTATOR CUFF REPAIR Right 2003   and BONE SPUR    Family History  Problem Relation Age of Onset   Esophageal cancer Mother 65   Lung cancer Father 41      Social History   Socioeconomic History   Marital status: Married    Spouse name: Not on file   Number of children: Not on file   Years of education: Not on file   Highest education level: Not on file  Occupational History   Not on file  Social Needs   Financial resource strain: Not on file   Food insecurity    Worry: Not on file    Inability: Not on file   Transportation needs    Medical: Not on file    Non-medical: Not on file  Tobacco Use   Smoking status: Former Smoker    Packs/day: 0.75    Years: 48.00    Pack years: 36.00    Types: Cigarettes    Quit date: 09/04/2017    Years since quitting: 1.6   Smokeless tobacco: Never Used  Substance and Sexual Activity   Alcohol use: No   Drug use: No   Sexual activity: Not on file  Lifestyle   Physical activity    Days per week: Not on file    Minutes per session: Not on file   Stress: Not on file  Relationships   Social connections    Talks on phone: Not on file    Gets together: Not on file    Attends religious service: Not on file    Active member of club or organization: Not on file    Attends meetings of clubs or organizations: Not on file    Relationship status: Not on file  Other Topics Concern   Not on file  Social History Narrative   Not on file    Allergies  Allergen Reactions   Ciprofloxacin Hcl Other (See Comments)    Aortic aneurysm per MD   Metoprolol Other (See  Comments) and Cough    and wheezing   Penicillins Shortness Of Breath, Rash and Other (  See Comments)    Has patient had a PCN reaction causing immediate rash, facial/tongue/throat swelling, SOB or lightheadedness with hypotension: Yes Has patient had a PCN reaction causing severe rash involving mucus membranes or skin necrosis: No Has patient had a PCN reaction that required hospitalization: Yes - MD office Has patient had a PCN reaction occurring within the last 10 years: No If all of the above answers are "NO", then may proceed with Cephalosporin use.    Oxycodone Other (See Comments)    Nightmares.  Tolerates tramadol   Advair Diskus [Fluticasone-Salmeterol] Anxiety   Sulfa Antibiotics Rash     Current Outpatient Medications:    acetaminophen (TYLENOL) 500 MG tablet, Take 1,000 mg by mouth 2 (two) times a day. , Disp: , Rfl:    albuterol (PROVENTIL HFA;VENTOLIN HFA) 108 (90 Base) MCG/ACT inhaler, Inhale 2 puffs into the lungs every 4 (four) hours as needed for wheezing or shortness of breath., Disp: , Rfl:    AMBULATORY NON FORMULARY MEDICATION, Take 2 capsules by mouth daily. Medication Name: clofazimine, Disp: 60 capsule, Rfl: 11   budesonide-formoterol (SYMBICORT) 160-4.5 MCG/ACT inhaler, Inhale 2 puffs into the lungs 2 (two) times daily., Disp: , Rfl:    CALCIUM CARBONATE ANTACID PO, Take 1,000 mg by mouth daily. , Disp: , Rfl:    carboxymethylcellulose (REFRESH PLUS) 0.5 % SOLN, Place 2 drops into both eyes 3 (three) times daily. , Disp: , Rfl:    Carboxymethylcellulose Sodium (REFRESH LIQUIGEL) 1 % GEL, Place 1 drop into both eyes at bedtime., Disp: , Rfl:    Cholecalciferol (VITAMIN D3) 5000 units CAPS, Take 5,000 Units by mouth daily. , Disp: , Rfl:    Cyanocobalamin (VITAMIN B-12 PO), Take 2,000 mcg by mouth daily. , Disp: , Rfl:    denosumab (PROLIA) 60 MG/ML SOSY injection, Inject 60 mg into the skin every 6 (six) months., Disp: , Rfl:    docusate sodium  (COLACE) 100 MG capsule, Take 200 mg by mouth daily. , Disp: , Rfl:    esomeprazole (NEXIUM) 20 MG capsule, Take 20 mg by mouth daily., Disp: , Rfl:    fluticasone (FLONASE) 50 MCG/ACT nasal spray, Place 1 spray into both nostrils daily. , Disp: , Rfl:    gabapentin (NEURONTIN) 100 MG capsule, Take 100 mg by mouth at bedtime. , Disp: , Rfl:    LORazepam (ATIVAN) 1 MG tablet, Take 0.5-1 mg by mouth See admin instructions. Take 0.5 mg by mouth in the morning and take 1 mg by mouth at bedtime, Disp: , Rfl:    moxifloxacin (AVELOX) 400 MG tablet, Take 1 tablet (400 mg total) by mouth daily at 8 pm., Disp: 30 tablet, Rfl: 5   polyethylene glycol (MIRALAX / GLYCOLAX) packet, Take 17 g by mouth daily. , Disp: , Rfl:    SUMAtriptan (IMITREX) 50 MG tablet, Take 50 mg by mouth every 2 (two) hours as needed for migraine. May repeat in 2 hours if headache persists or recurs., Disp: , Rfl:    tiotropium (SPIRIVA HANDIHALER) 18 MCG inhalation capsule, Place 18 mcg into inhaler and inhale daily. , Disp: , Rfl:    traMADol (ULTRAM) 50 MG tablet, Take 50 mg by mouth every 6 (six) hours as needed for moderate pain., Disp: , Rfl:    diltiazem (TAZTIA XT) 240 MG 24 hr capsule, Take 240 mg by mouth daily. , Disp: , Rfl:    HYDROmorphone (DILAUDID) 2 MG tablet, Take 2 mg by mouth every 6 (six) hours as needed  for severe pain., Disp: , Rfl:    linezolid (ZYVOX) 600 MG tablet, Take 1 tablet (600 mg total) by mouth 2 (two) times daily. (Patient not taking: Reported on 01/18/2019), Disp: 60 tablet, Rfl: 5   naproxen sodium (ALEVE) 220 MG tablet, Take 2 tablets (440 mg total) by mouth every morning. (Patient not taking: Reported on 04/27/2019), Disp: , Rfl:     Review of Systems  Constitutional: Negative for chills and fever.  HENT: Negative for congestion and sore throat.   Eyes: Negative for photophobia.  Respiratory: Negative for cough, chest tightness, shortness of breath and wheezing.   Cardiovascular:  Negative for chest pain, palpitations and leg swelling.  Gastrointestinal: Positive for abdominal distention. Negative for abdominal pain, blood in stool, constipation, diarrhea, nausea and vomiting.  Genitourinary: Negative for dysuria, flank pain and hematuria.  Musculoskeletal: Negative for back pain and myalgias.  Skin: Positive for wound. Negative for color change and rash.  Neurological: Negative for dizziness, weakness and headaches.  Hematological: Positive for adenopathy. Does not bruise/bleed easily.  Psychiatric/Behavioral: Negative for agitation, behavioral problems, confusion, decreased concentration and suicidal ideas. The patient is not nervous/anxious.        Objective:   Physical Exam Vitals signs and nursing note reviewed. Exam conducted with a chaperone present.  Constitutional:      General: She is not in acute distress.    Appearance: She is not diaphoretic.  HENT:     Head: Normocephalic and atraumatic.     Right Ear: External ear normal.     Left Ear: External ear normal.     Nose: Nose normal.     Mouth/Throat:     Pharynx: No oropharyngeal exudate.  Eyes:     General: No scleral icterus.    Conjunctiva/sclera: Conjunctivae normal.  Neck:     Musculoskeletal: Normal range of motion and neck supple.  Cardiovascular:     Rate and Rhythm: Normal rate and regular rhythm.     Heart sounds: Normal heart sounds. No murmur. No friction rub. No gallop.   Pulmonary:     Effort: Pulmonary effort is normal. No respiratory distress.     Breath sounds: Normal breath sounds. No stridor. No wheezing, rhonchi or rales.  Abdominal:     General: Bowel sounds are normal.     Palpations: Abdomen is soft.     Tenderness: There is no rebound.  Genitourinary:    Exam position: Supine.  Musculoskeletal: Normal range of motion.        General: No tenderness.  Lymphadenopathy:     Cervical: No cervical adenopathy.  Skin:    General: Skin is warm and dry.     Coloration:  Skin is not jaundiced or pale.     Findings: No erythema or rash.  Neurological:     General: No focal deficit present.     Mental Status: She is alert and oriented to person, place, and time.     Coordination: Coordination normal.  Psychiatric:        Mood and Affect: Mood normal.        Behavior: Behavior normal.        Thought Content: Thought content normal.        Judgment: Judgment normal.   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  Greatly appreciate Dr. Clyda Greener hard work on [place] and assistance from Dr. Lorenda Cahill  Developed what appears to be a seroma postoperatively has been drained.  We will continue on her current regimen for a minimum of 4 months as mentioned.   Aortic aneurysm: See above recommendations of Dr. Lorenda Cahill.  Patient tells me Dr. Alycia Rossetti wants to reimage her with a CT scan a year after discontinuing fluoroquinolone use.   Seroma: She is concerned by increased output have asked her to follow-up with the radiology drain clinic.  Need for flu vaccination I will give her a high-dose flu shot given her age.

## 2019-04-28 ENCOUNTER — Other Ambulatory Visit: Payer: Self-pay | Admitting: Surgery

## 2019-04-28 DIAGNOSIS — K651 Peritoneal abscess: Secondary | ICD-10-CM

## 2019-05-02 ENCOUNTER — Ambulatory Visit
Admission: RE | Admit: 2019-05-02 | Discharge: 2019-05-02 | Disposition: A | Discharge status: Home / Self Care | Payer: PPO | Source: Ambulatory Visit | Attending: Surgery | Admitting: Surgery

## 2019-05-02 DIAGNOSIS — K651 Peritoneal abscess: Secondary | ICD-10-CM | POA: Diagnosis not present

## 2019-05-02 MED ORDER — IOPAMIDOL (ISOVUE-300) INJECTION 61%
100.0000 mL | Freq: Once | INTRAVENOUS | Status: AC | PRN
  Administered 2019-05-02: 100 mL via INTRAVENOUS

## 2019-05-03 ENCOUNTER — Encounter: Payer: Self-pay | Admitting: Radiology

## 2019-05-03 ENCOUNTER — Ambulatory Visit
Admission: RE | Admit: 2019-05-03 | Discharge: 2019-05-03 | Disposition: A | Discharge status: Home / Self Care | Payer: PPO | Source: Ambulatory Visit | Attending: Surgery | Admitting: Surgery

## 2019-05-03 ENCOUNTER — Other Ambulatory Visit (HOSPITAL_COMMUNITY): Payer: Self-pay | Admitting: Radiology

## 2019-05-03 DIAGNOSIS — K651 Peritoneal abscess: Secondary | ICD-10-CM | POA: Diagnosis not present

## 2019-05-03 HISTORY — PX: IR RADIOLOGIST EVAL & MGMT: IMG5224

## 2019-05-03 NOTE — Progress Notes (Signed)
Referring Physician(s): Gross,Steven  Chief Complaint: The patient is seen in follow up today s/p percutaneous RLQ abscess drain placed in IR 04/20/19.  History of present illness:  Inguinal hernia repair 06/2018- Dr Johney Maine Several fluid/abscss collections since then Mesh removed 02/2019 Post surgical hematoma/seroma - collection drain placed 04/20/2019  Follows with Dr Johney Maine and Dr Drucilla Schmidt Most recent visit with Dr Drucilla Schmidt just last week  OP of this collection has been significant until just yesterday or so OP is thin yellow fluid - like urine color Up to 390 cc just 4 days ago Now just 40 cc out today She does not- or has she ever flushed drain  CT yesterday: IMPRESSION: 1. Interval placement of a right lower quadrant percutaneous drain with complete resolution of the previously noted fluid collection in this location. There is no residual undrained collection at this point. 2. Additional chronic findings as detailed above, stable from prior CT.  Output has slowed in last 24 hrs significantly Here for drain injection and evaluation  Past Medical History:  Diagnosis Date   Anxiety    Aortic aneurysm ([place]) 12/08/2018   Arthritis    HANDS   Chronic constipation    Complication of anesthesia    upon waking up from anesthesia patient experienced chest pain - EKG done was negative, patient states "it may have been indigestion"   COPD (chronic obstructive pulmonary disease) (HCC)    DDD (degenerative disc disease), lumbar    L2-3/3-4/4-5   Dizziness 01/17/2019   DOE (dyspnea on exertion) 01/17/2019   Dysuria    Frequency of urination    Ganglion cyst of right groin 11/23/2018   GERD (gastroesophageal reflux disease)    Hearing aid worn    Hypertension    Infected hernioplasty mesh ([place]) 01/26/2019   Migraine    Mild emphysema ([place])    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 HUMPHRIES/GRAPEY   Wears glasses     Past Surgical History:  Procedure Laterality Date   ABDOMINAL HYSTERECTOMY  1982   W/  UNILATERAL SALPINGOOPHORECTOMY   bilateral hernia surgery      removed ganglion cyst and swollen lymph node    CHOLECYSTECTOMY OPEN  1987   W/  APPENDECTOMY   COLONOSCOPY  2002  &  12-2012 & 03/19/15   CYSTO WITH HYDRODISTENSION N/A 02/20/2014   Procedure: [place] AND MARCAINE INSTILLATION;  Surgeon: Bernestine Amass, MD;  Location: [facility];  Service: Urology;  Laterality: N/A;   EXCISION MASS LOWER EXTREMETIES Right 11/02/2018   Procedure: REMOVAL OF RIGHT GROIN AND THIGH SUBCUTANEOUS MASSES.;  Surgeon: Michael Boston, MD;  Location: [place];  Service: General;  Laterality: Right;   EYE SURGERY     bilateral cataract surgery    GROIN DISSECTION N/A 02/28/2019   Procedure: [place] EXPLORATION AND DEBRIDEMENT;  Surgeon: Michael Boston, MD;  Location: [facility];  Service: General;  Laterality: N/A;   INCISION AND DRAINAGE ABSCESS N/A 12/09/2018   Procedure: INCISION AND DRAINAGE RIGHT GROIN and thigh  WITH DEBRIDEMENT;  Surgeon: Michael Boston, MD;  Location: [facility];  Service: General;  Laterality: N/A;   IR  RADIOLOGIST EVAL & MGMT  05/03/2019   LAPAROSCOPIC ABDOMINAL EXPLORATION Right 02/28/2019   Procedure: LAPAROSCOPIC EXPLORATION WITH REMOVAL OF RIGHT PREPERITONEAL MESH;  Surgeon: Michael Boston, MD;  Location: [facility];  Service: General;  Laterality: Right;   LAPAROSCOPIC LYSIS OF ADHESIONS Bilateral 02/28/2019   Procedure: LAPAROSCOPIC LYSIS OF ADHESIONS;  Surgeon: Michael Boston, MD;  Location: [facility];  Service: General;  Laterality: Bilateral;   LAPAROSCOPIC UNILATERAL SALPINGOOPHORECTOMY  1984    LESION REMOVAL Right 02/28/2019   Procedure: EXCISION VAGINAL ABSCESS  AND DISSECTION;  Surgeon: Michael Boston, MD;  Location: [facility];  Service: General;  Laterality: Right;   MASS EXCISION Right 06/16/2018   Procedure: REMOVAL OF RIGHT GROIN SUBCUTANEOUS MASS ERAS PATHWAY;  Surgeon: Michael Boston, MD;  Location: [facility];  Service: General;  Laterality: Right;   removal of lymph node cyst in right groin      ROTATOR CUFF REPAIR Right 2003   and BONE SPUR    Allergies: Ciprofloxacin hcl, Metoprolol, Penicillins, Oxycodone, Advair diskus [fluticasone-salmeterol], and Sulfa antibiotics  Medications: Prior to Admission medications   Medication Sig Start Date End Date Taking? Authorizing Provider  acetaminophen (TYLENOL) 500 MG tablet Take 1,000 mg by mouth 2 (two) times a day.     [provider]  albuterol (PROVENTIL HFA;VENTOLIN HFA) 108 (90 Base) MCG/ACT inhaler Inhale 2 puffs into the lungs every 4 (four) hours as needed for wheezing or shortness of breath.    [provider]  AMBULATORY NON FORMULARY MEDICATION Take 2 capsules by mouth daily. Medication Name: clofazimine 12/08/18   Tommy Medal, Lavell Islam, MD  budesonide-formoterol [facility]) 160-4.5 MCG/ACT inhaler Inhale 2 puffs into the lungs 2 (two) times daily.    [provider]  CALCIUM CARBONATE ANTACID PO Take 1,000 mg by mouth daily.     [provider]  carboxymethylcellulose (REFRESH PLUS) 0.5 % SOLN Place 2 drops into both eyes 3 (three) times daily.     [provider]  Carboxymethylcellulose Sodium (REFRESH LIQUIGEL) 1 % GEL Place 1 drop into both eyes at bedtime.    [provider]  Cholecalciferol (VITAMIN D3) 5000 units CAPS Take 5,000 Units by mouth daily.     [provider]  Cyanocobalamin (VITAMIN B-12 PO) Take 2,000 mcg by mouth daily.     [provider]  denosumab (PROLIA) 60 MG/ML SOSY injection Inject 60 mg into the skin every 6 (six) months.     [provider]  diltiazem (TAZTIA XT) 240 MG 24 hr capsule Take 240 mg by mouth daily.     [provider]  docusate sodium (COLACE) 100 MG capsule Take 200 mg by mouth daily.     [provider]  esomeprazole (NEXIUM) 20 MG capsule Take 20 mg by mouth daily.    [provider]  fluticasone (FLONASE) 50 MCG/ACT nasal spray Place 1 spray into both nostrils daily.     [provider]  gabapentin (NEURONTIN) 100 MG capsule Take 100 mg by mouth at bedtime.     [provider]  HYDROmorphone (DILAUDID) 2 MG tablet Take 2 mg by mouth every 6 (six) hours as needed for severe pain.    [provider]  linezolid (ZYVOX) 600 MG tablet Take 1 tablet (600 mg total) by mouth 2 (two) times daily. Patient not taking: Reported on 01/18/2019 12/08/18   Tommy Medal, Lavell Islam, MD  LORazepam (ATIVAN) 1 MG tablet Take 0.5-1 mg by mouth See admin instructions. Take 0.5 mg by  mouth in the morning and take 1 mg by mouth at bedtime    [provider]  moxifloxacin (AVELOX) 400 MG tablet Take 1 tablet (400 mg total) by mouth daily at 8 pm. 02/16/19   Tommy Medal, Lavell Islam, MD  naproxen sodium (ALEVE) 220 MG tablet Take 2 tablets (440 mg total) by mouth every morning. Patient not taking: Reported on 04/27/2019 12/10/18   Antonieta Pert, MD  polyethylene glycol (MIRALAX / GLYCOLAX) packet Take 17 g by mouth daily.     [provider]  SUMAtriptan (IMITREX) 50 MG tablet Take 50 mg by mouth every 2 (two) hours as needed for migraine. May repeat in 2 hours if headache persists or recurs.    [provider]  tiotropium (SPIRIVA HANDIHALER) 18 MCG inhalation capsule Place 18 mcg into inhaler and inhale daily.     [provider]  traMADol (ULTRAM) 50 MG tablet Take 50 mg by mouth every 6 (six) hours as needed for moderate pain.    [provider]     Family History  Problem Relation Age of Onset   Esophageal cancer Mother 62    Lung cancer Father 25    Social History   Socioeconomic History   Marital status: Married    Spouse name: Not on file   Number of children: Not on file   Years of education: Not on file   Highest education level: Not on file  Occupational History   Not on file  Social Needs   Financial resource strain: Not on file   Food insecurity    Worry: Not on file    Inability: Not on file   Transportation needs    Medical: Not on file    Non-medical: Not on file  Tobacco Use   Smoking status: Former Smoker    Packs/day: 0.75    Years: 48.00    Pack years: 36.00    Types: Cigarettes    Quit date: 09/04/2017    Years since quitting: 1.6   Smokeless tobacco: Never Used  Substance and Sexual Activity   Alcohol use: No   Drug use: No   Sexual activity: Not on file  Lifestyle   Physical activity    Days per week: Not on file    Minutes per session: Not on file   Stress: Not on file  Relationships   Social connections    Talks on phone: Not on file    Gets together: Not on file    Attends religious service: Not on file    Active member of club or organization: Not on file    Attends meetings of clubs or organizations: Not on file    Relationship status: Not on file  Other Topics Concern   Not on file  Social History Narrative   Not on file     Vital Signs: There were no vitals taken for this visit.  Physical Exam Skin:    General: Skin is warm and dry.     Comments: Site is clean and dry NT no bleeding Small amount of drainage in JP Was hooked up for drain injection Unable to push in any contrast Changed to saline flush-- still unable to get anything into drain Unable to aspirate also      Imaging: Ct Abdomen Pelvis W Contrast  Result Date: 05/03/2019 CLINICAL DATA:  Right lower quadrant abscess. EXAM: CT ABDOMEN AND PELVIS WITH CONTRAST TECHNIQUE: Multidetector CT imaging of the abdomen and pelvis was performed using  the standard protocol  following bolus administration of intravenous contrast. CONTRAST:  122mL ISOVUE-300 IOPAMIDOL (ISOVUE-300) INJECTION 61% COMPARISON:  04/19/2019 FINDINGS: Lower chest: Mild emphysematous changes are noted at the lung bases bilaterally.The heart size is normal. Hepatobiliary: The liver is normal. Status post cholecystectomy.There is no biliary ductal dilation. Pancreas: Normal contours without ductal dilatation. No peripancreatic fluid collection. Spleen: No splenic laceration or hematoma. Adrenals/Urinary Tract: --Adrenal glands: No adrenal hemorrhage. --Right kidney/ureter: No hydronephrosis or perinephric hematoma. --Left kidney/ureter: There is a hyperdense 1.3 cm nodule rising from the upper pole the left kidney. This is stable from prior study. --Urinary bladder: Unremarkable. Stomach/Bowel: --Stomach/Duodenum: No hiatal hernia or other gastric abnormality. Normal duodenal course and caliber. --Small bowel: No dilatation or inflammation. --Colon: There is an above average amount of stool throughout the colon. There is scattered colonic diverticula without CT evidence for diverticulitis. --Appendix: Not visualized. No right lower quadrant inflammation or free fluid. Vascular/Lymphatic: Atherosclerotic calcification is present within the non-aneurysmal abdominal aorta, without hemodynamically significant stenosis. --No retroperitoneal lymphadenopathy. --No mesenteric lymphadenopathy. --No pelvic or inguinal lymphadenopathy. Reproductive: Status post hysterectomy. No adnexal mass. Other: No ascites or free air. There are postsurgical changes in the right inguinal region which are similar to prior study. There is a percutaneous drainage catheter in the right lower quadrant. The previously noted fluid collection in the right lower quadrant is essentially resolved. There is no evidence for a residual drainable collection. The dome of the urinary bladder appears to be partially tethered to the drained collection.  Musculoskeletal. No acute displaced fractures. IMPRESSION: 1. Interval placement of a right lower quadrant percutaneous drain with complete resolution of the previously noted fluid collection in this location. There is no residual undrained collection at this point. 2. Additional chronic findings as detailed above, stable from prior CT. Electronically Signed   By: Constance Holster M.D.   On: 05/03/2019 08:25   Ir Radiologist Eval & Mgmt  Result Date: 05/03/2019 Please refer to notes tab for details about interventional procedure. (Op Note)   Labs:  CBC: Recent Labs    01/18/19 1124 02/24/19 1146 03/30/19 1146 04/20/19 1309  WBC 6.6 7.1 5.3 7.1  HGB 10.0* 12.8 12.2 12.1  HCT 30.9* 41.4 36.2 37.7  PLT 160 244 227 252    COAGS: Recent Labs    04/20/19 1309  INR 1.0    BMP: Recent Labs    01/17/19 1458 01/18/19 1124 02/24/19 1146 03/30/19 1146  NA 135 139 138 139  K 4.1 3.9 4.2 4.0  CL 105 107 107 107  CO2 21* 18* 24 25  GLUCOSE 98 97 98 96  BUN 19 20 15 19   CALCIUM 8.9 9.0 9.0 8.9  CREATININE 1.13* 1.02* 0.83 1.07*  GFRNONAA 48* 55* >60 51*  GFRAA 56* >60 >60 60    LIVER FUNCTION TESTS: Recent Labs    12/08/18 1735 01/17/19 1458 03/30/19 1146  BILITOT 0.2* 0.4 0.3  AST 26 13* 13  ALT 28 16 8   ALKPHOS 93 42  --   PROT 6.9 5.9* 6.1  ALBUMIN 3.7 3.9  --     Assessment:  Appears to have a clogged RLQ drain Unable to flush or aspirate catheter Discussed with Dr Earleen Newport Plan for drain exchange at [facility] next 24-48 hrs She is agreeable to plan   Signed: Lavonia Drafts, PA-C 05/03/2019, 3:06 PM   Please refer to Dr. Earleen Newport attestation of this note for management and plan.

## 2019-05-04 ENCOUNTER — Other Ambulatory Visit: Payer: PPO

## 2019-05-05 ENCOUNTER — Other Ambulatory Visit: Payer: Self-pay

## 2019-05-05 ENCOUNTER — Other Ambulatory Visit: Payer: PPO

## 2019-05-05 ENCOUNTER — Encounter (HOSPITAL_COMMUNITY): Payer: Self-pay | Admitting: Diagnostic Radiology

## 2019-05-05 ENCOUNTER — Ambulatory Visit (HOSPITAL_COMMUNITY)
Admission: RE | Admit: 2019-05-05 | Discharge: 2019-05-05 | Disposition: A | Discharge status: Home / Self Care | Payer: PPO | Source: Ambulatory Visit | Attending: Radiology | Admitting: Radiology

## 2019-05-05 DIAGNOSIS — T8189XA Other complications of procedures, not elsewhere classified, initial encounter: Secondary | ICD-10-CM | POA: Diagnosis not present

## 2019-05-05 DIAGNOSIS — Z4803 Encounter for change or removal of drains: Secondary | ICD-10-CM | POA: Diagnosis not present

## 2019-05-05 DIAGNOSIS — K651 Peritoneal abscess: Secondary | ICD-10-CM | POA: Diagnosis not present

## 2019-05-05 HISTORY — PX: IR CATHETER TUBE CHANGE: IMG717

## 2019-05-05 MED ORDER — LIDOCAINE HCL 1 % IJ SOLN
INTRAMUSCULAR | Status: AC
  Filled 2019-05-05: qty 20

## 2019-05-05 MED ORDER — IOHEXOL 300 MG/ML  SOLN
50.0000 mL | Freq: Once | INTRAMUSCULAR | Status: AC | PRN
  Administered 2019-05-05: 10 mL

## 2019-05-05 MED ORDER — LIDOCAINE HCL 1 % IJ SOLN
INTRAMUSCULAR | Status: DC | PRN
  Administered 2019-05-05 (×2): 5 mL

## 2019-05-05 NOTE — Procedures (Signed)
Interventional Radiology Procedure:   Indications: Post operative seroma in RLQ  Procedure: Drain exchange  Findings: Occluded drain.  Exchanged for new 10.2 Fr drain.  Small residual collection.  Complications: None     EBL: less than 10 ml  Plan: Follow drain output and contact IR when output less than 10 ml per day.     Zissel Biederman R. Anselm Pancoast, MD  Pager: [phone]

## 2019-05-09 ENCOUNTER — Other Ambulatory Visit: Payer: Self-pay | Admitting: Surgery

## 2019-05-09 DIAGNOSIS — K651 Peritoneal abscess: Secondary | ICD-10-CM

## 2019-05-10 ENCOUNTER — Ambulatory Visit
Admission: RE | Admit: 2019-05-10 | Discharge: 2019-05-10 | Disposition: A | Discharge status: Home / Self Care | Payer: PPO | Source: Ambulatory Visit | Attending: Surgery | Admitting: Surgery

## 2019-05-10 ENCOUNTER — Encounter: Payer: Self-pay | Admitting: *Deleted

## 2019-05-10 DIAGNOSIS — Z23 Encounter for immunization: Secondary | ICD-10-CM | POA: Diagnosis not present

## 2019-05-10 DIAGNOSIS — K651 Peritoneal abscess: Secondary | ICD-10-CM

## 2019-05-10 HISTORY — PX: IR RADIOLOGIST EVAL & MGMT: IMG5224

## 2019-05-10 NOTE — Progress Notes (Signed)
Patient ID: Catherine [name], female   DOB: 1945-08-29, 73 y.o.   MRN: [id number]       Chief Complaint: Patient was seen in consultation today for follow-up drain at the request of [place]  Referring Physician(s): Gross,Steven  History of Present Illness: Catherine [name] is a 73 y.o. female  s/p percutaneous RLQ abscess drain placed in IR 04/20/19.  History of present illness:  Inguinal hernia repair 06/2018- Dr Johney Maine Several fluid/abscss collections since then Mesh removed 02/2019 Post surgical hematoma/seroma - collection drain placed 04/20/2019  Follows with Dr Johney Maine and Dr Drucilla Schmidt Most recent visit with Dr Drucilla Schmidt just last week  She has had significant decrease in output from the drain.  Less than 10 mL of thin straw-colored fluid over the past 24 hours.  Drain injection 05/05/2019 showed no fistula, resolving cavity.  She presents for catheter removal.  Past Medical History:  Diagnosis Date   Anxiety    Aortic aneurysm ([place]) 12/08/2018   Arthritis    HANDS   Chronic constipation    Complication of anesthesia    upon waking up from anesthesia patient experienced chest pain - EKG done was negative, patient states "it may have been indigestion"   COPD (chronic obstructive pulmonary disease) (HCC)    DDD (degenerative disc disease), lumbar    L2-3/3-4/4-5   Dizziness 01/17/2019   DOE (dyspnea on exertion) 01/17/2019   Dysuria    Frequency of urination    Ganglion cyst of right groin 11/23/2018   GERD (gastroesophageal reflux disease)    Hearing aid worn    Hypertension    Infected hernioplasty mesh ([place]) 01/26/2019   Migraine    Mild emphysema ([place])    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 HUMPHRIES/GRAPEY   Wears glasses     Past Surgical History:  Procedure Laterality Date   ABDOMINAL HYSTERECTOMY  1982   W/  UNILATERAL SALPINGOOPHORECTOMY   bilateral hernia surgery      removed ganglion cyst and swollen lymph node    CHOLECYSTECTOMY OPEN  1987   W/  APPENDECTOMY   COLONOSCOPY  2002  &  12-2012 & 03/19/15   CYSTO WITH HYDRODISTENSION N/A 02/20/2014   Procedure: [address] AND MARCAINE INSTILLATION;  Surgeon: Bernestine Amass, MD;  Location: [facility];  Service: Urology;  Laterality: N/A;   EXCISION MASS LOWER EXTREMETIES Right 11/02/2018   Procedure: REMOVAL OF RIGHT GROIN AND THIGH SUBCUTANEOUS MASSES.;  Surgeon: Michael Boston, MD;  Location: [facility];  Service: General;  Laterality: Right;   EYE SURGERY     bilateral cataract surgery    GROIN DISSECTION N/A 02/28/2019   Procedure: Virl Son EXPLORATION AND DEBRIDEMENT;  Surgeon: Michael Boston, MD;  Location: [facility];  Service: General;  Laterality: N/A;   INCISION AND DRAINAGE ABSCESS N/A 12/09/2018   Procedure: INCISION AND DRAINAGE RIGHT GROIN and thigh  WITH DEBRIDEMENT;  Surgeon: Michael Boston, MD;  Location: [facility];  Service: General;  Laterality: N/A;   IR CATHETER TUBE CHANGE  05/05/2019   IR RADIOLOGIST EVAL & MGMT  05/03/2019   IR RADIOLOGIST EVAL & MGMT  05/10/2019  LAPAROSCOPIC ABDOMINAL EXPLORATION Right 02/28/2019   Procedure: LAPAROSCOPIC EXPLORATION WITH REMOVAL OF RIGHT PREPERITONEAL MESH;  Surgeon: Michael Boston, MD;  Location: [facility];  Service: General;  Laterality: Right;   LAPAROSCOPIC LYSIS OF ADHESIONS Bilateral 02/28/2019   Procedure: LAPAROSCOPIC LYSIS OF ADHESIONS;  Surgeon: Michael Boston, MD;  Location: [facility];  Service: General;  Laterality: Bilateral;   LAPAROSCOPIC UNILATERAL SALPINGOOPHORECTOMY  1984   LESION REMOVAL Right 02/28/2019   Procedure: EXCISION VAGINAL ABSCESS  AND DISSECTION;  Surgeon: Michael Boston,  MD;  Location: [facility];  Service: General;  Laterality: Right;   MASS EXCISION Right 06/16/2018   Procedure: REMOVAL OF RIGHT GROIN SUBCUTANEOUS MASS ERAS PATHWAY;  Surgeon: Michael Boston, MD;  Location: [facility];  Service: General;  Laterality: Right;   removal of lymph node cyst in right groin      ROTATOR CUFF REPAIR Right 2003   and BONE SPUR    Allergies: Ciprofloxacin hcl, Metoprolol, Penicillins, Oxycodone, Advair diskus [fluticasone-salmeterol], and Sulfa antibiotics  Medications: Prior to Admission medications   Medication Sig Start Date End Date Taking? Authorizing Provider  acetaminophen (TYLENOL) 500 MG tablet Take 1,000 mg by mouth 2 (two) times a day.     [provider]  albuterol (PROVENTIL HFA;VENTOLIN HFA) 108 (90 Base) MCG/ACT inhaler Inhale 2 puffs into the lungs every 4 (four) hours as needed for wheezing or shortness of breath.    [provider]  AMBULATORY NON FORMULARY MEDICATION Take 2 capsules by mouth daily. Medication Name: clofazimine 12/08/18   Tommy Medal, Lavell Islam, MD  budesonide-formoterol [facility]) 160-4.5 MCG/ACT inhaler Inhale 2 puffs into the lungs 2 (two) times daily.    [provider]  CALCIUM CARBONATE ANTACID PO Take 1,000 mg by mouth daily.     [provider]  carboxymethylcellulose (REFRESH PLUS) 0.5 % SOLN Place 2 drops into both eyes 3 (three) times daily.     [provider]  Carboxymethylcellulose Sodium (REFRESH LIQUIGEL) 1 % GEL Place 1 drop into both eyes at bedtime.    [provider]  Cholecalciferol (VITAMIN D3) 5000 units CAPS Take 5,000 Units by mouth daily.     [provider]  Cyanocobalamin (VITAMIN B-12 PO) Take 2,000 mcg by mouth daily.     [provider]  denosumab (PROLIA) 60 MG/ML SOSY injection Inject 60 mg into the skin every 6 (six) months.    [provider]  diltiazem (TAZTIA XT) 240 MG 24 hr capsule Take 240 mg by mouth daily.     [provider]  docusate sodium (COLACE) 100 MG capsule Take 200 mg by mouth daily.     [provider]  esomeprazole (NEXIUM) 20 MG capsule Take 20 mg by mouth daily.    [provider]  fluticasone (FLONASE) 50 MCG/ACT nasal spray Place 1 spray into both nostrils daily.     [provider]  gabapentin (NEURONTIN) 100 MG capsule Take 100 mg by mouth at bedtime.     [provider]  HYDROmorphone (DILAUDID) 2 MG tablet Take 2 mg by mouth every 6 (six) hours as needed for severe pain.    [provider]  linezolid (ZYVOX) 600 MG tablet Take 1 tablet (600 mg total) by mouth 2 (two) times daily. Patient not taking: Reported on 01/18/2019 12/08/18   Tommy Medal, Lavell Islam, MD  LORazepam (ATIVAN) 1 MG tablet Take 0.5-1 mg by mouth See admin instructions. Take 0.5 mg by mouth in the morning and take 1 mg  by mouth at bedtime    [provider]  moxifloxacin (AVELOX) 400 MG tablet Take 1 tablet (400 mg total) by mouth daily at 8 pm. 02/16/19   Tommy Medal, Lavell Islam, MD  naproxen sodium (ALEVE) 220 MG tablet Take 2 tablets (440 mg total) by mouth every morning. Patient not taking: Reported on 04/27/2019 12/10/18   Antonieta Pert, MD  polyethylene glycol (MIRALAX / GLYCOLAX) packet Take 17 g by mouth daily.     [provider]  SUMAtriptan (IMITREX) 50 MG tablet Take 50 mg by mouth every 2 (two) hours as needed for migraine. May repeat in 2 hours if headache persists or recurs.    [provider]  tiotropium (SPIRIVA HANDIHALER) 18 MCG inhalation capsule Place 18 mcg into inhaler and inhale daily.     [provider]  traMADol (ULTRAM) 50 MG tablet Take 50 mg by mouth every 6 (six) hours as needed for moderate pain.    [provider]     Family History  Problem Relation Age of Onset   Esophageal cancer Mother 36   Lung cancer Father 69    Social History   Socioeconomic History   Marital status: Married    Spouse name:  Not on file   Number of children: Not on file   Years of education: Not on file   Highest education level: Not on file  Occupational History   Not on file  Social Needs   Financial resource strain: Not on file   Food insecurity    Worry: Not on file    Inability: Not on file   Transportation needs    Medical: Not on file    Non-medical: Not on file  Tobacco Use   Smoking status: Former Smoker    Packs/day: 0.75    Years: 48.00    Pack years: 36.00    Types: Cigarettes    Quit date: 09/04/2017    Years since quitting: 1.6   Smokeless tobacco: Never Used  Substance and Sexual Activity   Alcohol use: No   Drug use: No   Sexual activity: Not on file  Lifestyle   Physical activity    Days per week: Not on file    Minutes per session: Not on file   Stress: Not on file  Relationships   Social connections    Talks on phone: Not on file    Gets together: Not on file    Attends religious service: Not on file    Active member of club or organization: Not on file    Attends meetings of clubs or organizations: Not on file    Relationship status: Not on file  Other Topics Concern   Not on file  Social History Narrative   Not on file    ECOG Status: 1 - Symptomatic but completely ambulatory    Physical Exam Constitutional: Oriented to person, place, and time. Well-developed and well-nourished. No distress.  Vital Signs: BP 129/70 (BP Location: Right Arm)    Pulse 99    Temp 98.4 F (36.9 C)    SpO2 99%   HENT:  Head: Normocephalic and atraumatic.  Eyes: Conjunctivae and EOM are normal. Right eye exhibits no discharge. Left eye exhibits no discharge. No scleral icterus.  Neck: No JVD present.  Pulmonary/Chest: Effort normal. No stridor. No respiratory distress.  Abdomen: soft, non distended.   right lower quadrant drain catheter insertion site clean/dry/intact.  Less than 10 mL thin straw-colored fluid in the  suction bulb. Neurological:  alert and  oriented to person, place, and time.  Skin: Skin is warm and dry.  not diaphoretic.  Psychiatric:   normal mood and affect.   behavior is normal. Judgment and thought content normal.   Mallampati Score: Review of Systems  Review of Systems: A 12 point ROS discussed and pertinent positives are indicated in the HPI above.  All other systems are negative.     Imaging: Ct Abdomen Pelvis W Contrast  Result Date: 05/03/2019 CLINICAL DATA:  Right lower quadrant abscess. EXAM: CT ABDOMEN AND PELVIS WITH CONTRAST TECHNIQUE: Multidetector CT imaging of the abdomen and pelvis was performed using the standard protocol following bolus administration of intravenous contrast. CONTRAST:  158mL ISOVUE-300 IOPAMIDOL (ISOVUE-300) INJECTION 61% COMPARISON:  04/19/2019 FINDINGS: Lower chest: Mild emphysematous changes are noted at the lung bases bilaterally.The heart size is normal. Hepatobiliary: The liver is normal. Status post cholecystectomy.There is no biliary ductal dilation. Pancreas: Normal contours without ductal dilatation. No peripancreatic fluid collection. Spleen: No splenic laceration or hematoma. Adrenals/Urinary Tract: --Adrenal glands: No adrenal hemorrhage. --Right kidney/ureter: No hydronephrosis or perinephric hematoma. --Left kidney/ureter: There is a hyperdense 1.3 cm nodule rising from the upper pole the left kidney. This is stable from prior study. --Urinary bladder: Unremarkable. Stomach/Bowel: --Stomach/Duodenum: No hiatal hernia or other gastric abnormality. Normal duodenal course and caliber. --Small bowel: No dilatation or inflammation. --Colon: There is an above average amount of stool throughout the colon. There is scattered colonic diverticula without CT evidence for diverticulitis. --Appendix: Not visualized. No right lower quadrant inflammation or free fluid. Vascular/Lymphatic: Atherosclerotic calcification is present within the non-aneurysmal abdominal aorta, without hemodynamically  significant stenosis. --No retroperitoneal lymphadenopathy. --No mesenteric lymphadenopathy. --No pelvic or inguinal lymphadenopathy. Reproductive: Status post hysterectomy. No adnexal mass. Other: No ascites or free air. There are postsurgical changes in the right inguinal region which are similar to prior study. There is a percutaneous drainage catheter in the right lower quadrant. The previously noted fluid collection in the right lower quadrant is essentially resolved. There is no evidence for a residual drainable collection. The dome of the urinary bladder appears to be partially tethered to the drained collection. Musculoskeletal. No acute displaced fractures. IMPRESSION: 1. Interval placement of a right lower quadrant percutaneous drain with complete resolution of the previously noted fluid collection in this location. There is no residual undrained collection at this point. 2. Additional chronic findings as detailed above, stable from prior CT. Electronically Signed   By: Constance Holster M.D.   On: 05/03/2019 08:25   Ct Abdomen Pelvis W Contrast  Result Date: 04/19/2019 CLINICAL DATA:  Right right lower quadrant abdominal wall mass, hernia mesh removal 02/27/2019 EXAM: CT ABDOMEN AND PELVIS WITH CONTRAST TECHNIQUE: Multidetector CT imaging of the abdomen and pelvis was performed using the standard protocol following bolus administration of intravenous contrast. CONTRAST:  121mL ISOVUE-300 IOPAMIDOL (ISOVUE-300) INJECTION 61%, additional oral enteric contrast COMPARISON:  11/26/2018 FINDINGS: Lower chest: No acute abnormality. Hepatobiliary: No focal liver abnormality is seen. Status post cholecystectomy. Postoperative biliary dilatation. Pancreas: Unremarkable. No pancreatic ductal dilatation or surrounding inflammatory changes. Spleen: Normal in size without significant abnormality. Adrenals/Urinary Tract: Adrenal glands are unremarkable. Kidneys are normal, without renal calculi, solid lesion, or  hydronephrosis. Bladder is unremarkable. Stomach/Bowel: Stomach is within normal limits. Appendix appears normal. No evidence of bowel wall thickening, distention, or inflammatory changes. Sigmoid diverticulosis. Moderate burden of stool in the colon. Vascular/Lymphatic: Aortic atherosclerosis. No enlarged abdominal or pelvic lymph nodes. Reproductive: Status post  hysterectomy. Other: Postoperative findings about the right groin in keeping with history of hernia mesh removal. There is a fluid collection about the right lower quadrant, overlying the right iliac vessels, abutting the inguinal ring and right bladder dome, measuring 8.0 x 6.3 cm (series 2, image 69). No abdominopelvic ascites. Musculoskeletal: No acute or significant osseous findings. IMPRESSION: 1. Postoperative findings about the right groin in keeping with history of hernia mesh removal. There is a fluid collection about the right lower quadrant, overlying the right iliac vessels, abutting the inguinal ring and right bladder dome, measuring 8.0 x 6.3 cm (series 2, image 69). This is most likely a postoperative hematoma or seroma. 2. Other chronic, incidental, and postoperative findings as detailed above. Aortic Atherosclerosis (ICD10-I70.0). Electronically Signed   By: Eddie Candle M.D.   On: 04/19/2019 15:37   Ir Catheter Tube Change  Result Date: 05/05/2019 INDICATION: 73 year old with history of right inguinal hernia repair and postoperative fluid collection in the right lower quadrant/inguinal region. Percutaneous drain was placed on 04/20/2019. Catheter recently stopped draining and the catheter appears to be occluded. Patient presents for drain exchange. EXAM: PERCUTANEOUS DRAIN EXCHANGE WITH FLUOROSCOPY MEDICATIONS: None ANESTHESIA/SEDATION: None COMPLICATIONS: None immediate. FLUOROSCOPY TIME:  1 minute and 18 seconds, 3 mGy CONTRAST:  10 mL Omnipaque 300 PROCEDURE: Informed written consent was obtained from the patient after a thorough  discussion of the procedural risks, benefits and alternatives. All questions were addressed. Maximal Sterile Barrier Technique was utilized including caps, mask, sterile gowns, sterile gloves, sterile drape, hand hygiene and skin antiseptic. A timeout was performed prior to the initiation of the procedure. The catheter in the right pelvis was prepped and draped in sterile fashion. Catheter would not flush. The catheter was cut and slightly pulled back. Catheter was removed over a stiff Glidewire. A new 10.2 [place] multipurpose drain was easily advanced over the wire and positioned in the small cavity. Contrast injection was performed to evaluate the cavity. Contrast was aspirated and the catheter was flushed with saline. Catheter was secured to skin with Prolene suture and attached to a suction bulb. FINDINGS: New catheter is well positioned within the small residual collection. Contrast was contained within the small collection. IMPRESSION: Successful exchange of the percutaneous drain in the right anterior pelvic region. Patient has been instructed to contact Interventional Radiology when the output decreases or stops again. Electronically Signed   By: Markus Daft M.D.   On: 05/05/2019 17:13   Dg Sinus/fist Tube Chk-non Gi  Result Date: 05/03/2019 INDICATION: 73 year old female with a history of right lower quadrant fluid collection drained via percutaneous drainage The patient records at least 200-300 cc daily output for the previous week, with precipitous drop to 30 cc yesterday and no output today. EXAM: THROUGH THE TUBE INJECTION MEDICATIONS: None ANESTHESIA/SEDATION: None COMPLICATIONS: None PROCEDURE: Informed written consent was obtained from the patient after a thorough discussion of the procedural risks, benefits and alternatives. All questions were addressed. Maximal Sterile Barrier Technique was utilized including caps, mask, sterile gowns, sterile gloves, sterile drape, hand hygiene and skin  antiseptic. A timeout was performed prior to the initiation of the procedure. Patient was positioned supine position on the table. Scout images were acquired. Attempted injection of contrast failed, with apparent blockage of the drainage catheter. Patient tolerated the procedure well and remained hemodynamically stable throughout. No complications were encountered and no significant blood loss. IMPRESSION: Attempted drain injection demonstrates apparent blockage of the drainage catheter. Signed, Dulcy Fanny. Earleen Newport DO, RPVI [facility] Radiology PLAN: Patient will be scheduled for drain exchange, given the output recorded the previous week and the current evidence of a drain obstruction. Electronically Signed   By: Corrie Mckusick D.O.   On: 05/03/2019 16:11   Ct Image Guided Drainage By Percutaneous Catheter  Result Date: 04/20/2019 INDICATION: 73 year old female with right lower quadrant fluid collection status post inguinal hernia repair. A request for drainage was placed EXAM: CT GUIDED DRAINAGE OF RIGHT LOWER QUADRANT ABSCESS MEDICATIONS: The patient is currently admitted to the hospital and receiving intravenous antibiotics. The antibiotics were administered within an appropriate time frame prior to the initiation of the procedure. ANESTHESIA/SEDATION: 2 mg IV Versed 100 mcg IV Fentanyl Moderate Sedation Time:  27 minutes The patient was continuously monitored during the procedure by the interventional radiology nurse under my direct supervision. COMPLICATIONS: None immediate. TECHNIQUE: Informed written consent was obtained from the patient after a thorough discussion of the procedural risks, benefits and alternatives. All questions were addressed. Maximal Sterile Barrier Technique was utilized including caps, mask, sterile gowns, sterile gloves, sterile drape, hand hygiene and skin antiseptic. A timeout was performed prior to the initiation of the procedure.  PROCEDURE: The operative field was prepped with Chlorhexidine in a sterile fashion, and a sterile drape was applied covering the operative field. A sterile gown and sterile gloves were used for the procedure. Local anesthesia was provided with 1% Lidocaine. A skin entry point was marked with a grid. Through the anesthetized area, a 5 [place] Yueh needle was advanced into the collection under intermittent CT guidance. Once in the collection, thin simple serous fluid was encountered. A short Amplatz wire was placed into the collection. The tract was dilated and a 10 French drainage catheter was placed. The Amplatz wire was removed. Approximately 60 cc of serous fluid was removed. The catheter was sutured in place with a non absorbable suture. A sterile dressing was applied. The patient tolerated the procedure well. There were no immediate complications. Final images demonstrate a well-positioned drainage catheter within the collection with interval decrease in size of the collection. FINDINGS: Scalp CT demonstrates again a right lower quadrant fluid collection. The needle is seen entering this collection from an anterolateral approach. The wire is seen coiled within this collection. Final images demonstrate a well placed 10 French drainage catheter. IMPRESSION: Successful placement of a 10 French drainage catheter within the right lower quadrant collection. Simple serous fluid was aspirated. A sample was sent to the laboratory for evaluation. Electronically Signed   By: Constance Holster M.D.   On: 04/20/2019 16:21   Ir Radiologist Eval & Mgmt  Result Date: 05/10/2019 Please refer to notes tab for details about interventional procedure. (Op Note)  Ir Radiologist Eval & Mgmt  Result Date: 05/03/2019 Please refer to notes tab for details about interventional procedure. (Op Note)   Labs:  CBC: Recent Labs    01/18/19 1124 02/24/19 1146 03/30/19 1146 04/20/19 1309  WBC 6.6 7.1 5.3 7.1  HGB 10.0* 12.8  12.2 12.1  HCT 30.9* 41.4 36.2 37.7  PLT 160 244 227 252    COAGS: Recent Labs    04/20/19 1309  INR 1.0    BMP: Recent Labs    01/17/19 1458 01/18/19 1124 02/24/19 1146 03/30/19 1146  NA 135 139 138 139  K 4.1 3.9 4.2 4.0  CL 105 107 107 107  CO2 21* 18* 24 25  GLUCOSE 98 97 98 96  BUN 19 20 15 19   CALCIUM  8.9 9.0 9.0 8.9  CREATININE 1.13* 1.02* 0.83 1.07*  GFRNONAA 48* 55* >60 51*  GFRAA 56* >60 >60 60    LIVER FUNCTION TESTS: Recent Labs    12/08/18 1735 01/17/19 1458 03/30/19 1146  BILITOT 0.2* 0.4 0.3  AST 26 13* 13  ALT 28 16 8   ALKPHOS 93 42  --   PROT 6.9 5.9* 6.1  ALBUMIN 3.7 3.9  --     TUMOR MARKERS: No results for input(s): AFPTM, CEA, CA199, CHROMGRNA in the last 8760 hours.  Assessment and Plan:  My impression is that her collection has resolved and her output is decreased to a minimal level, such that catheter can be removed.  I discussed with the patient the possibility of recurrent collection and the possibility of atherosclerosis should this occur. The catheter was cut and removed intact.  Sterile gauze dressing placed over the site. She will follow-up with surgery as scheduled.  She knows to call us with any questions or problems regarding the drain catheter site.  Thank you for this interesting consult.  I greatly enjoyed meeting Catherine [name] and look forward to participating in their care.  A copy of this report was sent to the requesting provider on this date.  Electronically Signed: Rickard Rhymes 05/10/2019, 9:06 AM   I spent a total of    15 Minutes in face to face in clinical consultation, greater than 50% of which was counseling/coordinating care for drain catheter removal.

## 2019-06-06 ENCOUNTER — Encounter: Payer: Self-pay | Admitting: Infectious Disease

## 2019-06-06 ENCOUNTER — Other Ambulatory Visit: Payer: Self-pay

## 2019-06-06 ENCOUNTER — Other Ambulatory Visit: Payer: Self-pay | Admitting: Infectious Disease

## 2019-06-06 ENCOUNTER — Ambulatory Visit (INDEPENDENT_AMBULATORY_CARE_PROVIDER_SITE_OTHER): Payer: PPO | Admitting: Infectious Disease

## 2019-06-06 VITALS — BP 143/80 | HR 97 | Temp 98.2°F | Wt 165.0 lb

## 2019-06-06 DIAGNOSIS — R1084 Generalized abdominal pain: Secondary | ICD-10-CM | POA: Diagnosis not present

## 2019-06-06 DIAGNOSIS — A318 Other mycobacterial infections: Secondary | ICD-10-CM | POA: Diagnosis not present

## 2019-06-06 DIAGNOSIS — R1031 Right lower quadrant pain: Secondary | ICD-10-CM | POA: Diagnosis not present

## 2019-06-06 DIAGNOSIS — I889 Nonspecific lymphadenitis, unspecified: Secondary | ICD-10-CM | POA: Diagnosis not present

## 2019-06-06 DIAGNOSIS — I712 Thoracic aortic aneurysm, without rupture, unspecified: Secondary | ICD-10-CM

## 2019-06-06 DIAGNOSIS — R109 Unspecified abdominal pain: Secondary | ICD-10-CM

## 2019-06-06 DIAGNOSIS — T8579XD Infection and inflammatory reaction due to other internal prosthetic devices, implants and grafts, subsequent encounter: Secondary | ICD-10-CM | POA: Diagnosis not present

## 2019-06-06 HISTORY — DX: Unspecified abdominal pain: R10.9

## 2019-06-06 NOTE — Progress Notes (Signed)
Complaint: Worsening abdominal pain and increase in area of induration in her right lower quadrant with numbness extending into her pelvis  Subjective:    Patient ID: Catherine [name], female    DOB: 22-Jul-1946, 73 y.o.   MRN: [id number]  HPI  Catherine [name] is a quite pleasant 73 year old Caucasian female with a past medical history examined for thoracic aneurysm, anxiety, social cystitis who in 2000 1617 developed a cystic lesion in her right groin.  She had this excised by general surgery on June 17 and pathology came back consistent with a lymphangioma and benign lymph nodes.  She then later underwent excision and repair of a pelvic mass which turned out on pathology be consistent with a synovial cyst, and also underwent bilateral femoral hernia repair and left inguinal hernia repair in June 2019.  In November 2019 she developed a proximal thigh mass which was excised and also found to be consistent with a synovial cyst and benign lymph nodes.  She had worsening swelling in her right groin and underwent incision and drainage of what appeared to be a seroma on September 20, 2018.  Postoperatively in the ensuing week she had worsening erythema and swelling and drainage.  She also developed overt fevers and malaise and was started on doxycycline with resolution of her fevers and malaise.  However just prior to surgery she was having worsening erythema in this area and started on doxycycline without any improvement in her symptoms.  She underwent right groin and inner thigh exploration with excision of subcutaneous lymph nodes on November 02, 2018 in the OR there was some yellow cheesy material found consistent with necrosis.  Pathology report showed necrotizing granulomatous inflammation in the soft tissue as well as the 3 lymph nodes that were excised.  Stains were negative but cultures have subsequently grown nontuberculous mycobacteria and ensuing few weeks.  This nontuberculous mycobacteria was probe  not just for TB but M gordonae I and M avium and kansasii I and none of these organisms were present.  Saw her in clinic we do not yet have the identity of the organism.  We held off on initiating antimicrobial therapy.  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  I   I felt strongly that she needed surgery with an I&D and she was admitted to [facility] long and underwent formal incision and debridement by Dr. Johney Maine in May of 2020. .  Cultures yielded MRSA as well as Mycobacterium fortuitum now showing intermediate susceptibility to Zyvox.  Along the way she  Was having dyspnea and some chest discomfort since being on Zyvox.  She has some dizziness with standing.  I was concerned that she might have bone marrow toxicity from Zyvox and anemia that might explain her symptoms.  She was indeed orthostatic on checking her blood pressure from lying to standing.  However stat labs that were done and came back show her to be anemic but not sufficiently anemic to explain her symptoms.  We stopped her Zyvox and clofazimine.  Ended up going to the emergency department getting a CT angiogram that ruled out pulmonary embolism.  Since stopping the Zyvox and the clofazimine she felt much better.  Given the complicated nature of her case and in particular the difficulty navigating antibiotics I reached out to Dr. Brigitte Pulse at [facility].  Dr. Lorenda Cahill felt strongly that this was a case that needed surgical reconsideration and certainly 1 that he needed to look over in person.  She therefore  traveled to [facility] on Monday and was seen by Dr. Lorenda Cahill.  Dr. Lorenda Cahill came to the conclusion after thoroughly reviewing the radiographs and the laboratory data susceptibility patterns and clinical case and feels very strongly that the patient very likely has infection of her mesh and that the mesh will need to be removed to affect cure.  Certainly if the mesh is involved  and I have also had anxiety about this there is no way wanted to cure his infection without eradication of this material.  Furthermore as Dr. Lorenda Cahill points out the antimicrobials that we are having to use against this organism are not without trivial risk to this patient.  Catherine [name] is now status post surgery on July 27 by Dr. gross with removal of infected mesh.  We have not recovered further Mycobacterium since that surgery.  She was then initiated on clofazimine and Avelox per Dr. Romie Levee recommendations.  She has been feeling much much better.  She did develop intra-abdominal fluid collection which was drained by interventional radiology on 16 September  and there is still a drain in place.  It is serous in nature and cultures that were sent for routine bacteria did not yield any organisms.  I last saw Catherine [name] she had an area of subtle induration in her abdomen which has worsened in the interim despite the drain having been removed and there being radiographic resolution of her fluid collection.  She is also been continuing on her antimycobacterial antibiotics.  I think she needs an MRI of the abdomen pelvis to ensure that she has not developed another area of infection in the abdomen pelvis that would require surgical attention.       Past Medical History:  Diagnosis Date  . Anxiety   . Aortic aneurysm ([place]) 12/08/2018  . Arthritis    HANDS  . Chronic constipation   . Complication of anesthesia    upon waking up from anesthesia patient experienced chest pain - EKG done was negative, patient states "it may have been indigestion"  . COPD (chronic obstructive pulmonary disease) ([place])   . DDD (degenerative disc disease), lumbar    L2-3/3-4/4-5  . Dizziness 01/17/2019  . DOE (dyspnea on exertion) 01/17/2019  . Dysuria   . Frequency of urination   . Ganglion cyst of right groin 11/23/2018  . GERD (gastroesophageal reflux disease)   . Hearing aid worn   . Hypertension   . Infected  hernioplasty mesh ([place]) 01/26/2019  . Migraine   . Mild emphysema ([place])   . 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 Osteopenia 2019   TO OSTEOPOROSIS   . Pelvic pain in female   . Seroma, post-traumatic ([place]) 04/27/2019  . Smokers' cough ([place])   . Thoracic aortic aneurysm (TAA) ([facility])    FOLLOWED BY DR BARTLE  LAST CT CHEST  WAS 6-3 SEE EPIC   . Urgency of urination    h/o INTERSTIAL CYSTITIS...DR. HUMPHRIES/GRAPEY  . Wears glasses     Past Surgical History:  Procedure Laterality Date  . ABDOMINAL HYSTERECTOMY  1982   W/  UNILATERAL SALPINGOOPHORECTOMY  . bilateral hernia surgery      removed ganglion cyst and swollen lymph node   . CHOLECYSTECTOMY OPEN  1987   W/  APPENDECTOMY  . COLONOSCOPY  2002  &  12-2012 & 03/19/15  . CYSTO WITH HYDRODISTENSION N/A 02/20/2014   Procedure: Perry AND MARCAINE INSTILLATION;  Surgeon: Bernestine Amass, MD;  Location: [facility];  Service:  Urology;  Laterality: N/A;  . EXCISION MASS LOWER EXTREMETIES Right 11/02/2018   Procedure: REMOVAL OF RIGHT GROIN AND THIGH SUBCUTANEOUS MASSES.;  Surgeon: Michael Boston, MD;  Location: [facility];  Service: General;  Laterality: Right;  . EYE SURGERY     bilateral cataract surgery   . GROIN DISSECTION N/A 02/28/2019   Procedure: Virl Son EXPLORATION AND DEBRIDEMENT;  Surgeon: Michael Boston, MD;  Location: [facility];  Service: General;  Laterality: N/A;  . INCISION AND DRAINAGE ABSCESS N/A 12/09/2018   Procedure: INCISION AND DRAINAGE RIGHT GROIN and thigh  WITH DEBRIDEMENT;  Surgeon: Michael Boston, MD;  Location: [facility];  Service: General;  Laterality: N/A;  . IR CATHETER TUBE CHANGE  05/05/2019  . IR RADIOLOGIST EVAL & MGMT  05/03/2019  . IR RADIOLOGIST EVAL & MGMT  05/10/2019  . LAPAROSCOPIC ABDOMINAL EXPLORATION Right 02/28/2019   Procedure: LAPAROSCOPIC EXPLORATION WITH REMOVAL OF RIGHT PREPERITONEAL MESH;  Surgeon: Michael Boston, MD;  Location: [facility];  Service: General;   Laterality: Right;  . LAPAROSCOPIC LYSIS OF ADHESIONS Bilateral 02/28/2019   Procedure: LAPAROSCOPIC LYSIS OF ADHESIONS;  Surgeon: Michael Boston, MD;  Location: [facility];  Service: General;  Laterality: Bilateral;  . LAPAROSCOPIC UNILATERAL SALPINGOOPHORECTOMY  1984  . LESION REMOVAL Right 02/28/2019   Procedure: EXCISION VAGINAL ABSCESS  AND DISSECTION;  Surgeon: Michael Boston, MD;  Location: [facility];  Service: General;  Laterality: Right;  . MASS EXCISION Right 06/16/2018   Procedure: REMOVAL OF RIGHT GROIN SUBCUTANEOUS MASS ERAS PATHWAY;  Surgeon: Michael Boston, MD;  Location: [facility];  Service: General;  Laterality: Right;  . removal of lymph node cyst in right groin     . ROTATOR CUFF REPAIR Right 2003   and BONE SPUR    Family History  Problem Relation Age of Onset  . Esophageal cancer Mother 33  . Lung cancer Father 66      Social History   Socioeconomic History  . Marital status: Married    Spouse name: Not on file  . Number of children: Not on file  . Years of education: Not on file  . Highest education level: Not on file  Occupational History  . Not on file  Social Needs  . Financial resource strain: Not on file  . Food insecurity    Worry: Not on file    Inability: Not on file  . Transportation needs    Medical: Not on file    Non-medical: Not on file  Tobacco Use  . Smoking status: Former Smoker    Packs/day: 0.75    Years: 48.00    Pack years: 36.00    Types: Cigarettes    Quit date: 09/04/2017    Years since quitting: 1.7  . Smokeless tobacco: Never Used  Substance and Sexual Activity  . Alcohol use: No  . Drug use: No  . Sexual activity: Not on file  Lifestyle  . Physical activity    Days per week: Not on file    Minutes per session: Not on file  . Stress: Not on file  Relationships  . Social Herbalist on phone: Not on file    Gets together: Not on file    Attends religious service: Not on file    Active member of club or organization:  Not on file    Attends meetings of clubs or organizations: Not on file    Relationship status: Not on file  Other Topics Concern  . Not on file  Social History Narrative  . Not on file    Allergies  Allergen Reactions  . Ciprofloxacin Hcl Other (See Comments)    Aortic aneurysm per MD  . Metoprolol Other (See Comments) and Cough    and wheezing  . Penicillins Shortness Of Breath, Rash and Other (See Comments)    Has patient had a PCN reaction causing immediate rash, facial/tongue/throat swelling, SOB or lightheadedness with hypotension: Yes Has patient had a PCN reaction causing severe rash involving mucus membranes or skin necrosis: No Has patient had a PCN reaction that required hospitalization: Yes - MD office Has patient had a PCN reaction occurring within the last 10 years: No If all of the above answers are "NO", then may proceed with Cephalosporin use.   Marland Kitchen Oxycodone Other (See Comments)    Nightmares.  Tolerates tramadol  . Advair Diskus [Fluticasone-Salmeterol] Anxiety     Current Outpatient Medications:  .  acetaminophen (TYLENOL) 500 MG tablet, Take 1,000 mg by mouth 2 (two) times a day. , Disp: , Rfl:  .  albuterol (PROVENTIL HFA;VENTOLIN HFA) 108 (90 Base) MCG/ACT inhaler, Inhale 2 puffs into the lungs every 4 (four) hours as needed for wheezing or shortness of breath., Disp: , Rfl:  .  AMBULATORY NON FORMULARY MEDICATION, Take 2 capsules by mouth daily. Medication Name: clofazimine, Disp: 60 capsule, Rfl: 11 .  budesonide-formoterol (SYMBICORT) 160-4.5 MCG/ACT inhaler, Inhale 2 puffs into the lungs 2 (two) times daily., Disp: , Rfl:  .  CALCIUM CARBONATE ANTACID PO, Take 1,000 mg by mouth daily. , Disp: , Rfl:  .  carboxymethylcellulose (REFRESH PLUS) 0.5 % SOLN, Place 2 drops into both eyes 3 (three) times daily. , Disp: , Rfl:  .  Carboxymethylcellulose Sodium (REFRESH LIQUIGEL) 1 % GEL, Place 1 drop into both eyes at bedtime., Disp: , Rfl:  .  Cholecalciferol  (VITAMIN D3) 5000 units CAPS, Take 5,000 Units by mouth daily. , Disp: , Rfl:  .  Cyanocobalamin (VITAMIN B-12 PO), Take 2,000 mcg by mouth daily. , Disp: , Rfl:  .  denosumab (PROLIA) 60 MG/ML SOSY injection, Inject 60 mg into the skin every 6 (six) months., Disp: , Rfl:  .  docusate sodium (COLACE) 100 MG capsule, Take 200 mg by mouth daily. , Disp: , Rfl:  .  esomeprazole (NEXIUM) 20 MG capsule, Take 20 mg by mouth daily., Disp: , Rfl:  .  fluticasone (FLONASE) 50 MCG/ACT nasal spray, Place 1 spray into both nostrils daily. , Disp: , Rfl:  .  gabapentin (NEURONTIN) 100 MG capsule, Take 100 mg by mouth at bedtime. , Disp: , Rfl:  .  LORazepam (ATIVAN) 1 MG tablet, Take 0.5-1 mg by mouth See admin instructions. Take 0.5 mg by mouth in the morning and take 1 mg by mouth at bedtime, Disp: , Rfl:  .  moxifloxacin (AVELOX) 400 MG tablet, Take 1 tablet (400 mg total) by mouth daily at 8 pm., Disp: 30 tablet, Rfl: 5 .  naproxen sodium (ALEVE) 220 MG tablet, Take 2 tablets (440 mg total) by mouth every morning., Disp: , Rfl:  .  polyethylene glycol (MIRALAX / GLYCOLAX) packet, Take 17 g by mouth daily. , Disp: , Rfl:  .  SUMAtriptan (IMITREX) 50 MG tablet, Take 50 mg by mouth every 2 (two) hours as needed for migraine. May repeat in 2 hours if headache persists or recurs., Disp: , Rfl:  .  tiotropium (SPIRIVA HANDIHALER) 18 MCG inhalation capsule, Place 18 mcg into inhaler and inhale daily. ,  Disp: , Rfl:  .  traMADol (ULTRAM) 50 MG tablet, Take 50 mg by mouth every 6 (six) hours as needed for moderate pain., Disp: , Rfl:  .  diltiazem (TAZTIA XT) 240 MG 24 hr capsule, Take 240 mg by mouth daily. , Disp: , Rfl:  .  HYDROmorphone (DILAUDID) 2 MG tablet, Take 2 mg by mouth every 6 (six) hours as needed for severe pain., Disp: , Rfl:  .  linezolid (ZYVOX) 600 MG tablet, Take 1 tablet (600 mg total) by mouth 2 (two) times daily. (Patient not taking: Reported on 01/18/2019), Disp: 60 tablet, Rfl: 5    Review  of Systems  Constitutional: Negative for chills and fever.  HENT: Negative for congestion and sore throat.   Eyes: Negative for photophobia.  Respiratory: Negative for cough, chest tightness, shortness of breath and wheezing.   Cardiovascular: Negative for chest pain, palpitations and leg swelling.  Gastrointestinal: Positive for abdominal distention and abdominal pain. Negative for blood in stool, constipation, diarrhea, nausea and vomiting.  Genitourinary: Negative for dysuria, flank pain and hematuria.  Musculoskeletal: Negative for back pain and myalgias.  Skin: Positive for wound. Negative for color change and rash.  Neurological: Negative for dizziness, weakness and headaches.  Hematological: Positive for adenopathy. Does not bruise/bleed easily.  Psychiatric/Behavioral: Negative for agitation, behavioral problems, confusion, decreased concentration and suicidal ideas. The patient is not nervous/anxious.        Objective:   Physical Exam Vitals signs and nursing note reviewed. Exam conducted with a chaperone present.  Constitutional:      General: She is not in acute distress.    Appearance: She is not diaphoretic.  HENT:     Head: Normocephalic and atraumatic.     Right Ear: External ear normal.     Left Ear: External ear normal.     Nose: Nose normal.     Mouth/Throat:     Pharynx: No oropharyngeal exudate.  Eyes:     General: No scleral icterus.    Conjunctiva/sclera: Conjunctivae normal.  Neck:     Musculoskeletal: Normal range of motion and neck supple.  Cardiovascular:     Rate and Rhythm: Normal rate and regular rhythm.     Heart sounds: Normal heart sounds. No murmur. No friction rub. No gallop.   Pulmonary:     Effort: Pulmonary effort is normal. No respiratory distress.     Breath sounds: Normal breath sounds. No stridor. No wheezing, rhonchi or rales.  Abdominal:     General: Bowel sounds are normal.     Palpations: Abdomen is soft.     Tenderness: There is  no rebound.  Genitourinary:    Exam position: Supine.  Musculoskeletal: Normal range of motion.        General: No tenderness.  Lymphadenopathy:     Cervical: No cervical adenopathy.  Skin:    General: Skin is warm and dry.     Coloration: Skin is not jaundiced or pale.     Findings: No erythema or rash.  Neurological:     General: No focal deficit present.     Mental Status: She is alert and oriented to person, place, and time.     Coordination: Coordination normal.  Psychiatric:        Mood and Affect: Mood normal.        Behavior: Behavior normal.        Thought Content: Thought content normal.        Judgment: Judgment normal.   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  Greatly appreciate Dr. Clyda Greener hard work on [place] and assistance from Dr. Lorenda Cahill  Developed what appears to be a seroma postoperatively has been drained.  I am greatly concerned by this area of induration and tenderness in her right lower quadrant.  I am concerned that she may be developing soft tissue infection there that was not visualized on imaging.  I will order labs today and an MRI with and without contrast of the abdomen pelvis   Aortic aneurysm: See above recommendations of Dr. Lorenda Cahill.  Patient tells me Dr. Alycia Rossetti wants to reimage her with a CT scan a year after discontinuing fluoroquinolone use.  I spent greater than 25 minutes with the patient including greater than 50% of time in face to face counsel of the patient regarding the  nature of mycobacterial infections and need for both surgical and medical therapy to achieve cure and coordination of care

## 2019-06-07 LAB — COMPLETE METABOLIC PANEL WITH GFR
AG Ratio: 1.8 (calc) (ref 1.0–2.5)
ALT: 9 U/L (ref 6–29)
AST: 14 U/L (ref 10–35)
Albumin: 4 g/dL (ref 3.6–5.1)
Alkaline phosphatase (APISO): 59 U/L (ref 37–153)
BUN/Creatinine Ratio: 14 (calc) (ref 6–22)
BUN: 16 mg/dL (ref 7–25)
CO2: 22 mmol/L (ref 20–32)
Calcium: 9.4 mg/dL (ref 8.6–10.4)
Chloride: 108 mmol/L (ref 98–110)
Creat: 1.15 mg/dL — ABNORMAL HIGH (ref 0.60–0.93)
GFR, Est African American: 55 mL/min/{1.73_m2} — ABNORMAL LOW (ref >=60)
GFR, Est Non African American: 47 mL/min/{1.73_m2} — ABNORMAL LOW (ref >=60)
Globulin: 2.2 g/dL (calc) (ref 1.9–3.7)
Glucose, Bld: 95 mg/dL (ref 65–99)
Potassium: 4.2 mmol/L (ref 3.5–5.3)
Sodium: 141 mmol/L (ref 135–146)
Total Bilirubin: 0.3 mg/dL (ref 0.2–1.2)
Total Protein: 6.2 g/dL (ref 6.1–8.1)

## 2019-06-07 LAB — CBC WITH DIFFERENTIAL/PLATELET
Absolute Monocytes: 625 cells/uL (ref 200–950)
Basophils Absolute: 21 cells/uL (ref 0–200)
Basophils Relative: 0.3 %
Eosinophils Absolute: 92 cells/uL (ref 15–500)
Eosinophils Relative: 1.3 %
HCT: 38.2 % (ref 35.0–45.0)
Hemoglobin: 12.9 g/dL (ref 11.7–15.5)
Lymphs Abs: 1789 cells/uL (ref 850–3900)
MCH: 28.4 pg (ref 27.0–33.0)
MCHC: 33.8 g/dL (ref 32.0–36.0)
MCV: 84.1 fL (ref 80.0–100.0)
MPV: 11 fL (ref 7.5–12.5)
Monocytes Relative: 8.8 %
Neutro Abs: 4572 cells/uL (ref 1500–7800)
Neutrophils Relative %: 64.4 %
Platelets: 217 10*3/uL (ref 140–400)
RBC: 4.54 10*6/uL (ref 3.80–5.10)
RDW: 15.1 % — ABNORMAL HIGH (ref 11.0–15.0)
Total Lymphocyte: 25.2 %
WBC: 7.1 10*3/uL (ref 3.8–10.8)

## 2019-06-07 LAB — C-REACTIVE PROTEIN: CRP: 0.3 mg/L (ref <8.0)

## 2019-06-07 LAB — SEDIMENTATION RATE: Sed Rate: 2 mm/h (ref 0–30)

## 2019-06-14 ENCOUNTER — Other Ambulatory Visit: Payer: Self-pay

## 2019-06-14 ENCOUNTER — Ambulatory Visit (HOSPITAL_COMMUNITY)
Admission: RE | Admit: 2019-06-14 | Discharge: 2019-06-14 | Disposition: A | Discharge status: Home / Self Care | Payer: PPO | Source: Ambulatory Visit | Attending: Infectious Disease | Admitting: Infectious Disease

## 2019-06-14 DIAGNOSIS — R1084 Generalized abdominal pain: Secondary | ICD-10-CM

## 2019-06-14 DIAGNOSIS — R109 Unspecified abdominal pain: Secondary | ICD-10-CM | POA: Diagnosis not present

## 2019-06-14 MED ORDER — GADOBUTROL 1 MMOL/ML IV SOLN
7.0000 mL | Freq: Once | INTRAVENOUS | Status: AC | PRN
  Administered 2019-06-14: 7 mL via INTRAVENOUS

## 2019-06-25 ENCOUNTER — Other Ambulatory Visit: Payer: PPO

## 2019-06-26 ENCOUNTER — Other Ambulatory Visit: Payer: Self-pay | Admitting: Internal Medicine

## 2019-06-27 ENCOUNTER — Ambulatory Visit: Payer: PPO | Admitting: Infectious Disease

## 2019-06-28 ENCOUNTER — Telehealth: Payer: Self-pay | Admitting: *Deleted

## 2019-06-28 ENCOUNTER — Encounter: Payer: Self-pay | Admitting: Pharmacist

## 2019-06-28 ENCOUNTER — Other Ambulatory Visit: Payer: Self-pay | Admitting: Internal Medicine

## 2019-06-28 NOTE — Telephone Encounter (Signed)
Received refill request for fluconazole 150 mg take 1 pill for 1 dose with #5 refills. This is not currently on her medication list. Please advise. Landis Gandy, RN

## 2019-06-28 NOTE — Telephone Encounter (Signed)
That is fine to fill that

## 2019-06-29 DIAGNOSIS — J309 Allergic rhinitis, unspecified: Secondary | ICD-10-CM | POA: Diagnosis not present

## 2019-06-29 DIAGNOSIS — R062 Wheezing: Secondary | ICD-10-CM | POA: Diagnosis not present

## 2019-07-04 DIAGNOSIS — A318 Other mycobacterial infections: Secondary | ICD-10-CM | POA: Diagnosis not present

## 2019-07-04 DIAGNOSIS — A498 Other bacterial infections of unspecified site: Secondary | ICD-10-CM | POA: Diagnosis not present

## 2019-07-04 DIAGNOSIS — G8929 Other chronic pain: Secondary | ICD-10-CM | POA: Diagnosis not present

## 2019-07-04 DIAGNOSIS — R1031 Right lower quadrant pain: Secondary | ICD-10-CM | POA: Diagnosis not present

## 2019-07-18 ENCOUNTER — Ambulatory Visit (INDEPENDENT_AMBULATORY_CARE_PROVIDER_SITE_OTHER): Payer: PPO | Admitting: Infectious Disease

## 2019-07-18 ENCOUNTER — Encounter: Payer: Self-pay | Admitting: Infectious Disease

## 2019-07-18 ENCOUNTER — Other Ambulatory Visit: Payer: Self-pay

## 2019-07-18 VITALS — Wt 168.0 lb

## 2019-07-18 DIAGNOSIS — I712 Thoracic aortic aneurysm, without rupture, unspecified: Secondary | ICD-10-CM

## 2019-07-18 DIAGNOSIS — L02419 Cutaneous abscess of limb, unspecified: Secondary | ICD-10-CM

## 2019-07-18 DIAGNOSIS — T8579XD Infection and inflammatory reaction due to other internal prosthetic devices, implants and grafts, subsequent encounter: Secondary | ICD-10-CM

## 2019-07-18 DIAGNOSIS — I889 Nonspecific lymphadenitis, unspecified: Secondary | ICD-10-CM

## 2019-07-18 DIAGNOSIS — A318 Other mycobacterial infections: Secondary | ICD-10-CM | POA: Diagnosis not present

## 2019-07-18 DIAGNOSIS — A319 Mycobacterial infection, unspecified: Secondary | ICD-10-CM | POA: Diagnosis not present

## 2019-07-18 DIAGNOSIS — M60051 Infective myositis, right thigh: Secondary | ICD-10-CM | POA: Diagnosis not present

## 2019-07-18 DIAGNOSIS — L089 Local infection of the skin and subcutaneous tissue, unspecified: Secondary | ICD-10-CM | POA: Diagnosis not present

## 2019-07-18 NOTE — Progress Notes (Signed)
Complaint: Worsening pain in her right thigh which seems to correlate with the inflamed muscle seen on MRI  Subjective:    Patient ID: Catherine [name], female    DOB: 01-29-1946, 73 y.o.   MRN: [id number]  HPI  Catherine [name] is a quite pleasant 73 year old Caucasian female with a past medical history examined for thoracic aneurysm, anxiety, social cystitis who in 2000 1617 developed a cystic lesion in her right groin.  She had this excised by general surgery on June 17 and pathology came back consistent with a lymphangioma and benign lymph nodes.  She then later underwent excision and repair of a pelvic mass which turned out on pathology be consistent with a synovial cyst, and also underwent bilateral femoral hernia repair and left inguinal hernia repair in June 2019.  In November 2019 she developed a proximal thigh mass which was excised and also found to be consistent with a synovial cyst and benign lymph nodes.  She had worsening swelling in her right groin and underwent incision and drainage of what appeared to be a seroma on September 20, 2018.  Postoperatively in the ensuing week she had worsening erythema and swelling and drainage.  She also developed overt fevers and malaise and was started on doxycycline with resolution of her fevers and malaise.  However just prior to surgery she was having worsening erythema in this area and started on doxycycline without any improvement in her symptoms.  She underwent right groin and inner thigh exploration with excision of subcutaneous lymph nodes on November 02, 2018 in the OR there was some yellow cheesy material found consistent with necrosis.  Pathology report showed necrotizing granulomatous inflammation in the soft tissue as well as the 3 lymph nodes that were excised.  Stains were negative but cultures have subsequently grown nontuberculous mycobacteria and ensuing few weeks.  This nontuberculous mycobacteria was probe not just for TB but M gordonae I  and M avium and kansasii I and none of these organisms were present.  Saw her in clinic we do not yet have the identity of the organism.  We held off on initiating antimicrobial therapy.  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  I   I felt strongly that she needed surgery with an I&D and she was admitted to [facility] long and underwent formal incision and debridement by Dr. Johney Maine in May of 2020. .  Cultures yielded MRSA as well as Mycobacterium fortuitum now showing intermediate susceptibility to Zyvox.  Along the way she  Was having dyspnea and some chest discomfort since being on Zyvox.  She has some dizziness with standing.  I was concerned that she might have bone marrow toxicity from Zyvox and anemia that might explain her symptoms.  She was indeed orthostatic on checking her blood pressure from lying to standing.  However stat labs that were done and came back show her to be anemic but not sufficiently anemic to explain her symptoms.  We stopped her Zyvox and clofazimine.  Ended up going to the emergency department getting a CT angiogram that ruled out pulmonary embolism.  Since stopping the Zyvox and the clofazimine she felt much better.  Given the complicated nature of her case and in particular the difficulty navigating antibiotics I reached out to Dr. Brigitte Pulse at [facility].  Dr. Lorenda Cahill felt strongly that this was a case that needed surgical reconsideration and certainly 1 that he needed to look over in person.  She therefore traveled to [facility] on Monday and was seen by Dr. Lorenda Cahill.  Dr. Lorenda Cahill came to the conclusion after thoroughly reviewing the radiographs and the laboratory data susceptibility patterns and clinical case and feels very strongly that the patient very likely has infection of her mesh and that the mesh will need to be removed to affect cure.  Certainly if the mesh is involved and I have also had anxiety about  this there is no way wanted to cure his infection without eradication of this material.  Furthermore as Dr. Lorenda Cahill points out the antimicrobials that we are having to use against this organism are not without trivial risk to this patient.  Catherine [name] is now status post surgery on July 27 by Dr. gross with removal of infected mesh.  We have not recovered further Mycobacterium since that surgery.  She was then initiated on clofazimine and Avelox per Dr. Romie Levee recommendations.  She has been feeling much much better.  She did develop intra-abdominal fluid collection which was drained by interventional radiology on 16 September  and there is still a drain in place.  It is serous in nature and cultures that were sent for routine bacteria did not yield any organisms.  I last saw Catherine [name] she had an area of subtle induration in her abdomen which has worsened in the interim despite the drain having been removed and there being radiographic resolution of her fluid collection.  She is also been continuing on her antimycobacterial antibiotics.  We obtained an MRI of the abdomen pelvis on June 14, 2019 and this showed:   IMPRESSION: 1. Interval resolution large fluid collection within the right lower quadrant of the abdomen status post percutaneous drainage. 2. No discrete, drainable fluid collection identified at this time. 3. Increased T2 signal and enhancement within the right abductor musculature. This is nonspecific and may be inflammatory or infectious in etiology. Cannot rule out myositis. No discrete, drainable fluid collection identified at this time      Past Medical History:  Diagnosis Date  . Abdominal pain 06/06/2019  . Anxiety   . Aortic aneurysm ([place]) 12/08/2018  . Arthritis    HANDS  . Chronic constipation   . Complication of anesthesia    upon waking up from anesthesia patient experienced chest pain - EKG done was negative, patient states "it may have been indigestion"  .  COPD (chronic obstructive pulmonary disease) ([place])   . DDD (degenerative disc disease), lumbar    L2-3/3-4/4-5  . Dizziness 01/17/2019  . DOE (dyspnea on exertion) 01/17/2019  . Dysuria   . Frequency of urination   . Ganglion cyst of right groin 11/23/2018  . GERD (gastroesophageal reflux disease)   . Hearing aid worn   . Hypertension   . Infected hernioplasty mesh ([place]) 01/26/2019  . Migraine   . Mild emphysema ([place])   . 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 Osteopenia 2019   TO OSTEOPOROSIS   . Pelvic pain in female   . Seroma, post-traumatic ([place]) 04/27/2019  . Smokers' cough ([place])   . Thoracic aortic aneurysm (TAA) ([facility])    FOLLOWED BY DR BARTLE  LAST CT CHEST  WAS 6-3 SEE EPIC   . Urgency of urination    h/o INTERSTIAL CYSTITIS...DR. HUMPHRIES/GRAPEY  . Wears glasses     Past Surgical History:  Procedure Laterality Date  . ABDOMINAL HYSTERECTOMY  1982   W/  UNILATERAL SALPINGOOPHORECTOMY  . bilateral hernia surgery      removed ganglion cyst and swollen lymph node   .  CHOLECYSTECTOMY OPEN  1987   W/  APPENDECTOMY  . COLONOSCOPY  2002  &  12-2012 & 03/19/15  . CYSTO WITH HYDRODISTENSION N/A 02/20/2014   Procedure: [place] AND MARCAINE INSTILLATION;  Surgeon: Bernestine Amass, MD;  Location: [facility];  Service: Urology;  Laterality: N/A;  . EXCISION MASS LOWER EXTREMETIES Right 11/02/2018   Procedure: REMOVAL OF RIGHT GROIN AND THIGH SUBCUTANEOUS MASSES.;  Surgeon: Michael Boston, MD;  Location: [place];  Service: General;  Laterality: Right;  . EYE SURGERY     bilateral cataract surgery   . GROIN DISSECTION N/A 02/28/2019   Procedure: Virl Son EXPLORATION AND DEBRIDEMENT;  Surgeon: Michael Boston, MD;  Location: [facility];  Service: General;  Laterality: N/A;  . INCISION AND DRAINAGE ABSCESS N/A 12/09/2018   Procedure: INCISION AND DRAINAGE RIGHT GROIN and thigh  WITH DEBRIDEMENT;  Surgeon: Michael Boston, MD;  Location: [facility];   Service: General;  Laterality: N/A;  . IR CATHETER TUBE CHANGE  05/05/2019  . IR RADIOLOGIST EVAL & MGMT  05/03/2019  . IR RADIOLOGIST EVAL & MGMT  05/10/2019  . LAPAROSCOPIC ABDOMINAL EXPLORATION Right 02/28/2019   Procedure: LAPAROSCOPIC EXPLORATION WITH REMOVAL OF RIGHT PREPERITONEAL MESH;  Surgeon: Michael Boston, MD;  Location: [facility];  Service: General;  Laterality: Right;  . LAPAROSCOPIC LYSIS OF ADHESIONS Bilateral 02/28/2019   Procedure: LAPAROSCOPIC LYSIS OF ADHESIONS;  Surgeon: Michael Boston, MD;  Location: [facility];  Service: General;  Laterality: Bilateral;  . LAPAROSCOPIC UNILATERAL SALPINGOOPHORECTOMY  1984  . LESION REMOVAL Right 02/28/2019   Procedure: EXCISION VAGINAL ABSCESS  AND DISSECTION;  Surgeon: Michael Boston, MD;  Location: [facility];  Service: General;  Laterality: Right;  . MASS EXCISION Right 06/16/2018   Procedure: REMOVAL OF RIGHT GROIN SUBCUTANEOUS MASS ERAS PATHWAY;  Surgeon: Michael Boston, MD;  Location: [facility];  Service: General;  Laterality: Right;  . removal of lymph node cyst in right groin     . ROTATOR CUFF REPAIR Right 2003   and BONE SPUR    Family History  Problem Relation Age of Onset  . Esophageal cancer Mother 26  . Lung cancer Father 24      Social History   Socioeconomic History  . Marital status: Married    Spouse name: Not on file  . Number of children: Not on file  . Years of education: Not on file  . Highest education level: Not on file  Occupational History  . Not on file  Tobacco Use  . Smoking status: Former Smoker    Packs/day: 0.75    Years: 48.00    Pack years: 36.00    Types: Cigarettes    Quit date: 09/04/2017    Years since quitting: 1.8  . Smokeless tobacco: Never Used  Substance and Sexual Activity  . Alcohol use: No  . Drug use: No  . Sexual activity: Not on file  Other Topics Concern  . Not on file  Social History Narrative  . Not on file   Social Determinants of Health   Financial Resource Strain:   .  Difficulty of Paying Living Expenses: Not on file  Food Insecurity:   . Worried About Charity fundraiser in the Last Year: Not on file  . Ran Out of Food in the Last Year: Not on file  Transportation Needs:   . Lack of Transportation (Medical): Not on file  . Lack of Transportation (Non-Medical): Not on file  Physical Activity:   . Days  of Exercise per Week: Not on file  . Minutes of Exercise per Session: Not on file  Stress:   . Feeling of Stress : Not on file  Social Connections:   . Frequency of Communication with Friends and Family: Not on file  . Frequency of Social Gatherings with Friends and Family: Not on file  . Attends Religious Services: Not on file  . Active Member of Clubs or Organizations: Not on file  . Attends Archivist Meetings: Not on file  . Marital Status: Not on file    Allergies  Allergen Reactions  . Ciprofloxacin Hcl Other (See Comments)    Aortic aneurysm per MD  . Metoprolol Other (See Comments) and Cough    and wheezing  . Penicillins Shortness Of Breath, Rash and Other (See Comments)    Has patient had a PCN reaction causing immediate rash, facial/tongue/throat swelling, SOB or lightheadedness with hypotension: Yes Has patient had a PCN reaction causing severe rash involving mucus membranes or skin necrosis: No Has patient had a PCN reaction that required hospitalization: Yes - MD office Has patient had a PCN reaction occurring within the last 10 years: No If all of the above answers are "NO", then may proceed with Cephalosporin use.   Marland Kitchen Oxycodone Other (See Comments)    Nightmares.  Tolerates tramadol  . Advair Diskus [Fluticasone-Salmeterol] Anxiety     Current Outpatient Medications:  .  acetaminophen (TYLENOL) 500 MG tablet, Take 1,000 mg by mouth 2 (two) times a day. , Disp: , Rfl:  .  albuterol (PROVENTIL HFA;VENTOLIN HFA) 108 (90 Base) MCG/ACT inhaler, Inhale 2 puffs into the lungs every 4 (four) hours as needed for wheezing or  shortness of breath., Disp: , Rfl:  .  AMBULATORY NON FORMULARY MEDICATION, Take 2 capsules by mouth daily. Medication Name: clofazimine, Disp: 60 capsule, Rfl: 11 .  budesonide-formoterol (SYMBICORT) 160-4.5 MCG/ACT inhaler, Inhale 2 puffs into the lungs 2 (two) times daily., Disp: , Rfl:  .  CALCIUM CARBONATE ANTACID PO, Take 1,000 mg by mouth daily. , Disp: , Rfl:  .  carboxymethylcellulose (REFRESH PLUS) 0.5 % SOLN, Place 2 drops into both eyes 3 (three) times daily. , Disp: , Rfl:  .  Carboxymethylcellulose Sodium (REFRESH LIQUIGEL) 1 % GEL, Place 1 drop into both eyes at bedtime., Disp: , Rfl:  .  Cholecalciferol (VITAMIN D3) 5000 units CAPS, Take 5,000 Units by mouth daily. , Disp: , Rfl:  .  Cyanocobalamin (VITAMIN B-12 PO), Take 2,000 mcg by mouth daily. , Disp: , Rfl:  .  denosumab (PROLIA) 60 MG/ML SOSY injection, Inject 60 mg into the skin every 6 (six) months., Disp: , Rfl:  .  docusate sodium (COLACE) 100 MG capsule, Take 200 mg by mouth daily. , Disp: , Rfl:  .  esomeprazole (NEXIUM) 20 MG capsule, Take 20 mg by mouth daily., Disp: , Rfl:  .  fluconazole (DIFLUCAN) 150 MG tablet, TAKE 1 TABLET BY MOUTH AS ONE DOSE, Disp: 1 tablet, Rfl: 5 .  fluticasone (FLONASE) 50 MCG/ACT nasal spray, Place 1 spray into both nostrils daily. , Disp: , Rfl:  .  gabapentin (NEURONTIN) 100 MG capsule, Take 100 mg by mouth at bedtime. , Disp: , Rfl:  .  LORazepam (ATIVAN) 1 MG tablet, Take 0.5-1 mg by mouth See admin instructions. Take 0.5 mg by mouth in the morning and take 1 mg by mouth at bedtime, Disp: , Rfl:  .  moxifloxacin (AVELOX) 400 MG tablet, Take 1 tablet (400  mg total) by mouth daily at 8 pm., Disp: 30 tablet, Rfl: 5 .  naproxen sodium (ALEVE) 220 MG tablet, Take 2 tablets (440 mg total) by mouth every morning., Disp: , Rfl:  .  polyethylene glycol (MIRALAX / GLYCOLAX) packet, Take 17 g by mouth daily. , Disp: , Rfl:  .  SUMAtriptan (IMITREX) 50 MG tablet, Take 50 mg by mouth every 2 (two)  hours as needed for migraine. May repeat in 2 hours if headache persists or recurs., Disp: , Rfl:  .  tiotropium (SPIRIVA HANDIHALER) 18 MCG inhalation capsule, Place 18 mcg into inhaler and inhale daily. , Disp: , Rfl:  .  traMADol (ULTRAM) 50 MG tablet, Take 50 mg by mouth every 6 (six) hours as needed for moderate pain., Disp: , Rfl:  .  diltiazem (TAZTIA XT) 240 MG 24 hr capsule, Take 240 mg by mouth daily. , Disp: , Rfl:  .  HYDROmorphone (DILAUDID) 2 MG tablet, Take 2 mg by mouth every 6 (six) hours as needed for severe pain., Disp: , Rfl:  .  linezolid (ZYVOX) 600 MG tablet, Take 1 tablet (600 mg total) by mouth 2 (two) times daily. (Patient not taking: Reported on 01/18/2019), Disp: 60 tablet, Rfl: 5    Review of Systems  Constitutional: Negative for chills and fever.  HENT: Negative for congestion and sore throat.   Eyes: Negative for photophobia.  Respiratory: Negative for cough, chest tightness, shortness of breath and wheezing.   Cardiovascular: Negative for chest pain, palpitations and leg swelling.  Gastrointestinal: Positive for abdominal distention. Negative for blood in stool, constipation, diarrhea, nausea and vomiting.  Genitourinary: Negative for dysuria, flank pain and hematuria.  Musculoskeletal: Positive for arthralgias and myalgias. Negative for back pain.  Skin: Positive for wound. Negative for color change and rash.  Neurological: Negative for dizziness, weakness and headaches.  Hematological: Positive for adenopathy. Does not bruise/bleed easily.  Psychiatric/Behavioral: Negative for agitation, behavioral problems, confusion, decreased concentration and suicidal ideas. The patient is not nervous/anxious.        Objective:   Physical Exam Vitals and nursing note reviewed. Exam conducted with a chaperone present.  Constitutional:      General: She is not in acute distress.    Appearance: She is not diaphoretic.  HENT:     Head: Normocephalic and atraumatic.      Right Ear: External ear normal.     Left Ear: External ear normal.     Nose: Nose normal.     Mouth/Throat:     Pharynx: No oropharyngeal exudate.  Eyes:     General: No scleral icterus.    Conjunctiva/sclera: Conjunctivae normal.  Cardiovascular:     Rate and Rhythm: Normal rate and regular rhythm.     Heart sounds: Normal heart sounds. No murmur. No friction rub. No gallop.   Pulmonary:     Effort: Pulmonary effort is normal. No respiratory distress.     Breath sounds: Normal breath sounds. No stridor. No wheezing, rhonchi or rales.  Abdominal:     General: Bowel sounds are normal.     Palpations: Abdomen is soft.     Tenderness: There is no rebound.  Genitourinary:    Exam position: Supine.  Musculoskeletal:        General: No tenderness. Normal range of motion.     Cervical back: Normal range of motion and neck supple.  Lymphadenopathy:     Cervical: No cervical adenopathy.  Skin:    General: Skin is warm and  dry.     Coloration: Skin is not jaundiced or pale.     Findings: No erythema or rash.  Neurological:     General: No focal deficit present.     Mental Status: She is alert and oriented to person, place, and time.     Coordination: Coordination normal.  Psychiatric:        Mood and Affect: Mood normal.        Behavior: Behavior normal.        Thought Content: Thought content normal.        Judgment: Judgment normal.   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  Greatly appreciate Dr. Clyda Greener hard work on [place] and assistance from Dr. Lorenda Cahill  Developed what appears to be a seroma postoperatively has been drained.  Prior showed resolution of intra-abdominal fluid collections but did show evidence of possible myositis of the abductor muscle.  She had progressive difficulty walking.  We obtained an MRI with and without contrast to reevaluate the cell area if she has pyomyositis she will need a surgical intervention with cultures done for mycobacterial organisms.  In the interim she is to continue on her current antimicrobial regimen   Aortic aneurysm: See above recommendations of Dr. Lorenda Cahill.  Patient told me Dr. Alycia Rossetti wants to reimage her with a CT scan a year after discontinuing fluoroquinolone use.

## 2019-07-26 ENCOUNTER — Ambulatory Visit (HOSPITAL_COMMUNITY): Payer: PPO

## 2019-08-03 ENCOUNTER — Ambulatory Visit (HOSPITAL_COMMUNITY)
Admission: RE | Admit: 2019-08-03 | Discharge: 2019-08-03 | Disposition: A | Discharge status: Home / Self Care | Payer: PPO | Source: Ambulatory Visit | Attending: Infectious Disease | Admitting: Infectious Disease

## 2019-08-03 ENCOUNTER — Other Ambulatory Visit: Payer: Self-pay

## 2019-08-03 DIAGNOSIS — A319 Mycobacterial infection, unspecified: Secondary | ICD-10-CM

## 2019-08-03 DIAGNOSIS — K651 Peritoneal abscess: Secondary | ICD-10-CM | POA: Diagnosis not present

## 2019-08-03 MED ORDER — GADOBUTROL 1 MMOL/ML IV SOLN
7.0000 mL | Freq: Once | INTRAVENOUS | Status: AC | PRN
  Administered 2019-08-03: 7 mL via INTRAVENOUS

## 2019-08-05 HISTORY — PX: KNEE ARTHROSCOPY W/ MENISCAL REPAIR: SHX1877

## 2019-08-17 ENCOUNTER — Ambulatory Visit (INDEPENDENT_AMBULATORY_CARE_PROVIDER_SITE_OTHER): Payer: PPO | Admitting: Infectious Disease

## 2019-08-17 ENCOUNTER — Other Ambulatory Visit: Payer: Self-pay

## 2019-08-17 ENCOUNTER — Encounter: Payer: Self-pay | Admitting: Infectious Disease

## 2019-08-17 VITALS — BP 115/70 | HR 91 | Temp 97.8°F | Resp 12 | Ht 70.0 in | Wt 169.0 lb

## 2019-08-17 DIAGNOSIS — I889 Nonspecific lymphadenitis, unspecified: Secondary | ICD-10-CM

## 2019-08-17 DIAGNOSIS — L089 Local infection of the skin and subcutaneous tissue, unspecified: Secondary | ICD-10-CM | POA: Diagnosis not present

## 2019-08-17 DIAGNOSIS — A318 Other mycobacterial infections: Secondary | ICD-10-CM | POA: Diagnosis not present

## 2019-08-17 DIAGNOSIS — L02419 Cutaneous abscess of limb, unspecified: Secondary | ICD-10-CM

## 2019-08-17 DIAGNOSIS — T8579XD Infection and inflammatory reaction due to other internal prosthetic devices, implants and grafts, subsequent encounter: Secondary | ICD-10-CM | POA: Diagnosis not present

## 2019-08-17 NOTE — Progress Notes (Signed)
Complaint: Continued pain in her thigh  Subjective:    Patient ID: Catherine [name], female    DOB: June 17, 1946, 74 y.o.   MRN: [id number]  HPI  Catherine [name] is a quite pleasant 74 year old Caucasian female with a past medical history examined for thoracic aneurysm, anxiety, social cystitis who in 2000 1617 developed a cystic lesion in her right groin.  She had this excised by general surgery on June 17 and pathology came back consistent with a lymphangioma and benign lymph nodes.  She then later underwent excision and repair of a pelvic mass which turned out on pathology be consistent with a synovial cyst, and also underwent bilateral femoral hernia repair and left inguinal hernia repair in June 2019.  In November 2019 she developed a proximal thigh mass which was excised and also found to be consistent with a synovial cyst and benign lymph nodes.  She had worsening swelling in her right groin and underwent incision and drainage of what appeared to be a seroma on September 20, 2018.  Postoperatively in the ensuing week she had worsening erythema and swelling and drainage.  She also developed overt fevers and malaise and was started on doxycycline with resolution of her fevers and malaise.  However just prior to surgery she was having worsening erythema in this area and started on doxycycline without any improvement in her symptoms.  She underwent right groin and inner thigh exploration with excision of subcutaneous lymph nodes on November 02, 2018 in the OR there was some yellow cheesy material found consistent with necrosis.  Pathology report showed necrotizing granulomatous inflammation in the soft tissue as well as the 3 lymph nodes that were excised.  Stains were negative but cultures have subsequently grown nontuberculous mycobacteria and ensuing few weeks.  This nontuberculous mycobacteria was probe not just for TB but M gordonae I and M avium and kansasii I and none of these organisms were  present.  Saw her in clinic we do not yet have the identity of the organism.  We held off on initiating antimicrobial therapy.  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  I   I felt strongly that she needed surgery with an I&D and she was admitted to [facility] long and underwent formal incision and debridement by Dr. Johney Maine in May of 2020. .  Cultures yielded MRSA as well as Mycobacterium fortuitum now showing intermediate susceptibility to Zyvox.  Along the way she  Was having dyspnea and some chest discomfort since being on Zyvox.  She has some dizziness with standing.  I was concerned that she might have bone marrow toxicity from Zyvox and anemia that might explain her symptoms.  She was indeed orthostatic on checking her blood pressure from lying to standing.  However stat labs that were done and came back show her to be anemic but not sufficiently anemic to explain her symptoms.  We stopped her Zyvox and clofazimine.  Ended up going to the emergency department getting a CT angiogram that ruled out pulmonary embolism.  Since stopping the Zyvox and the clofazimine she felt much better.  Given the complicated nature of her case and in particular the difficulty navigating antibiotics I reached out to Dr. Brigitte Pulse at [facility].  Dr. Lorenda Cahill felt strongly that this was a case that needed surgical reconsideration and certainly 1 that he needed to look over in person.  She therefore traveled to [facility] on Monday and was seen by Dr. Lorenda Cahill.  Dr. Lorenda Cahill came to the conclusion after thoroughly reviewing the radiographs and the laboratory data susceptibility patterns and clinical case and feels very strongly that the patient very likely has infection of her mesh and that the mesh will need to be removed to affect cure.  Certainly if the mesh is involved and I have also had anxiety about this there is no way wanted to cure his infection without  eradication of this material.  Furthermore as Dr. Lorenda Cahill points out the antimicrobials that we are having to use against this organism are not without trivial risk to this patient.  Catherine [name] is now status post surgery on July 27 by Dr. gross with removal of infected mesh.  We have not recovered further Mycobacterium since that surgery.  She was then initiated on clofazimine and Avelox per Dr. Romie Levee recommendations.  She has been feeling much much better.  She did develop intra-abdominal fluid collection which was drained by interventional radiology on 16 September  and there is still a drain in place.  It is serous in nature and cultures that were sent for routine bacteria did not yield any organisms.  I last saw Catherine [name] she had an area of subtle induration in her abdomen which has worsened in the interim despite the drain having been removed and there being radiographic resolution of her fluid collection.  She is also been continuing on her antimycobacterial antibiotics.  We obtained an MRI of the abdomen pelvis on June 14, 2019 and this showed:   IMPRESSION: 1. Interval resolution large fluid collection within the right lower quadrant of the abdomen status post percutaneous drainage. 2. No discrete, drainable fluid collection identified at this time. 3. Increased T2 signal and enhancement within the right abductor musculature. This is nonspecific and may be inflammatory or infectious in etiology. Cannot rule out myositis. No discrete, drainable fluid collection identified at this time  Since then she was still have pain in this area we were concerned and we reimaged with an MRI of the pelvis on August 03, 2019: Which showed:  IMPRESSION: No evidence of residual or recurrent pelvic abscess.  Decreased edema and persistent atrophy of the right abductor muscles, consistent with subacute denervation changes.     Past Medical History:  Diagnosis Date  . Abdominal pain  06/06/2019  . Anxiety   . Aortic aneurysm ([place]) 12/08/2018  . Arthritis    HANDS  . Chronic constipation   . Complication of anesthesia    upon waking up from anesthesia patient experienced chest pain - EKG done was negative, patient states "it may have been indigestion"  . COPD (chronic obstructive pulmonary disease) ([place])   . DDD (degenerative disc disease), lumbar    L2-3/3-4/4-5  . Dizziness 01/17/2019  . DOE (dyspnea on exertion) 01/17/2019  . Dysuria   . Frequency of urination   . Ganglion cyst of right groin 11/23/2018  . GERD (gastroesophageal reflux disease)   . Hearing aid worn   . Hypertension   . Infected hernioplasty mesh ([place]) 01/26/2019  . Migraine   . Mild emphysema ([place])   . 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 Osteopenia 2019   TO OSTEOPOROSIS   . Pelvic pain in female   . Seroma, post-traumatic ([place]) 04/27/2019  . Smokers' cough ([place])   . Thoracic aortic aneurysm (TAA) ([facility])    FOLLOWED BY DR BARTLE  LAST CT CHEST  WAS 6-3 SEE EPIC   . Urgency of urination    h/o INTERSTIAL CYSTITIS...DR. HUMPHRIES/GRAPEY  . Wears  glasses     Past Surgical History:  Procedure Laterality Date  . ABDOMINAL HYSTERECTOMY  1982   W/  UNILATERAL SALPINGOOPHORECTOMY  . bilateral hernia surgery      removed ganglion cyst and swollen lymph node   . CHOLECYSTECTOMY OPEN  1987   W/  APPENDECTOMY  . COLONOSCOPY  2002  &  12-2012 & 03/19/15  . CYSTO WITH HYDRODISTENSION N/A 02/20/2014   Procedure: [place] AND MARCAINE INSTILLATION;  Surgeon: Bernestine Amass, MD;  Location: [facility];  Service: Urology;  Laterality: N/A;  . EXCISION MASS LOWER EXTREMETIES Right 11/02/2018   Procedure: REMOVAL OF RIGHT GROIN AND THIGH SUBCUTANEOUS MASSES.;  Surgeon: Michael Boston, MD;  Location: [place];  Service: General;  Laterality: Right;  . EYE SURGERY     bilateral cataract surgery   . GROIN DISSECTION N/A 02/28/2019   Procedure: [place] EXPLORATION  AND DEBRIDEMENT;  Surgeon: Michael Boston, MD;  Location: [facility];  Service: General;  Laterality: N/A;  . INCISION AND DRAINAGE ABSCESS N/A 12/09/2018   Procedure: INCISION AND DRAINAGE RIGHT GROIN and thigh  WITH DEBRIDEMENT;  Surgeon: Michael Boston, MD;  Location: [facility];  Service: General;  Laterality: N/A;  . IR CATHETER TUBE CHANGE  05/05/2019  . IR RADIOLOGIST EVAL & MGMT  05/03/2019  . IR RADIOLOGIST EVAL & MGMT  05/10/2019  . LAPAROSCOPIC ABDOMINAL EXPLORATION Right 02/28/2019   Procedure: LAPAROSCOPIC EXPLORATION WITH REMOVAL OF RIGHT PREPERITONEAL MESH;  Surgeon: Michael Boston, MD;  Location: [facility];  Service: General;  Laterality: Right;  . LAPAROSCOPIC LYSIS OF ADHESIONS Bilateral 02/28/2019   Procedure: LAPAROSCOPIC LYSIS OF ADHESIONS;  Surgeon: Michael Boston, MD;  Location: [facility];  Service: General;  Laterality: Bilateral;  . LAPAROSCOPIC UNILATERAL SALPINGOOPHORECTOMY  1984  . LESION REMOVAL Right 02/28/2019   Procedure: EXCISION VAGINAL ABSCESS  AND DISSECTION;  Surgeon: Michael Boston, MD;  Location: [facility];  Service: General;  Laterality: Right;  . MASS EXCISION Right 06/16/2018   Procedure: REMOVAL OF RIGHT GROIN SUBCUTANEOUS MASS ERAS PATHWAY;  Surgeon: Michael Boston, MD;  Location: [facility];  Service: General;  Laterality: Right;  . removal of lymph node cyst in right groin     . ROTATOR CUFF REPAIR Right 2003   and BONE SPUR    Family History  Problem Relation Age of Onset  . Esophageal cancer Mother 73  . Lung cancer Father 81      Social History   Socioeconomic History  . Marital status: Married    Spouse name: Not on file  . Number of children: Not on file  . Years of education: Not on file  . Highest education level: Not on file  Occupational History  . Not on file  Tobacco Use  . Smoking status: Former Smoker    Packs/day: 0.75    Years: 48.00    Pack years: 36.00    Types: Cigarettes    Quit date: 09/04/2017    Years since quitting: 1.9  . Smokeless  tobacco: Never Used  Substance and Sexual Activity  . Alcohol use: No  . Drug use: No  . Sexual activity: Not on file  Other Topics Concern  . Not on file  Social History Narrative  . Not on file   Social Determinants of Health   Financial Resource Strain:   . Difficulty of Paying Living Expenses: Not on file  Food Insecurity:   . Worried About Charity fundraiser in the Last Year: Not  on file  . Ran Out of Food in the Last Year: Not on file  Transportation Needs:   . Lack of Transportation (Medical): Not on file  . Lack of Transportation (Non-Medical): Not on file  Physical Activity:   . Days of Exercise per Week: Not on file  . Minutes of Exercise per Session: Not on file  Stress:   . Feeling of Stress : Not on file  Social Connections:   . Frequency of Communication with Friends and Family: Not on file  . Frequency of Social Gatherings with Friends and Family: Not on file  . Attends Religious Services: Not on file  . Active Member of Clubs or Organizations: Not on file  . Attends Archivist Meetings: Not on file  . Marital Status: Not on file    Allergies  Allergen Reactions  . Ciprofloxacin Hcl Other (See Comments)    Aortic aneurysm per MD  . Metoprolol Other (See Comments) and Cough    and wheezing  . Penicillins Shortness Of Breath, Rash and Other (See Comments)    Has patient had a PCN reaction causing immediate rash, facial/tongue/throat swelling, SOB or lightheadedness with hypotension: Yes Has patient had a PCN reaction causing severe rash involving mucus membranes or skin necrosis: No Has patient had a PCN reaction that required hospitalization: Yes - MD office Has patient had a PCN reaction occurring within the last 10 years: No If all of the above answers are "NO", then may proceed with Cephalosporin use.   Marland Kitchen Oxycodone Other (See Comments)    Nightmares.  Tolerates tramadol  . Advair Diskus [Fluticasone-Salmeterol] Anxiety     Current  Outpatient Medications:  .  acetaminophen (TYLENOL) 500 MG tablet, Take 1,000 mg by mouth 2 (two) times a day. , Disp: , Rfl:  .  albuterol (PROVENTIL HFA;VENTOLIN HFA) 108 (90 Base) MCG/ACT inhaler, Inhale 2 puffs into the lungs every 4 (four) hours as needed for wheezing or shortness of breath., Disp: , Rfl:  .  AMBULATORY NON FORMULARY MEDICATION, Take 2 capsules by mouth daily. Medication Name: clofazimine, Disp: 60 capsule, Rfl: 11 .  budesonide-formoterol (SYMBICORT) 160-4.5 MCG/ACT inhaler, Inhale 2 puffs into the lungs 2 (two) times daily., Disp: , Rfl:  .  CALCIUM CARBONATE ANTACID PO, Take 1,000 mg by mouth daily. , Disp: , Rfl:  .  carboxymethylcellulose (REFRESH PLUS) 0.5 % SOLN, Place 2 drops into both eyes 3 (three) times daily. , Disp: , Rfl:  .  Carboxymethylcellulose Sodium (REFRESH LIQUIGEL) 1 % GEL, Place 1 drop into both eyes at bedtime., Disp: , Rfl:  .  Cholecalciferol (VITAMIN D3) 5000 units CAPS, Take 5,000 Units by mouth daily. , Disp: , Rfl:  .  Cyanocobalamin (VITAMIN B-12 PO), Take 2,000 mcg by mouth daily. , Disp: , Rfl:  .  denosumab (PROLIA) 60 MG/ML SOSY injection, Inject 60 mg into the skin every 6 (six) months., Disp: , Rfl:  .  diltiazem (TAZTIA XT) 240 MG 24 hr capsule, Take 240 mg by mouth daily. , Disp: , Rfl:  .  docusate sodium (COLACE) 100 MG capsule, Take 200 mg by mouth daily. , Disp: , Rfl:  .  esomeprazole (NEXIUM) 20 MG capsule, Take 20 mg by mouth daily., Disp: , Rfl:  .  fluconazole (DIFLUCAN) 150 MG tablet, TAKE 1 TABLET BY MOUTH AS ONE DOSE, Disp: 1 tablet, Rfl: 5 .  fluticasone (FLONASE) 50 MCG/ACT nasal spray, Place 1 spray into both nostrils daily. , Disp: , Rfl:  .  gabapentin (NEURONTIN) 100 MG capsule, Take 100 mg by mouth at bedtime. , Disp: , Rfl:  .  HYDROmorphone (DILAUDID) 2 MG tablet, Take 2 mg by mouth every 6 (six) hours as needed for severe pain., Disp: , Rfl:  .  linezolid (ZYVOX) 600 MG tablet, Take 1 tablet (600 mg total) by mouth  2 (two) times daily. (Patient not taking: Reported on 01/18/2019), Disp: 60 tablet, Rfl: 5 .  LORazepam (ATIVAN) 1 MG tablet, Take 0.5-1 mg by mouth See admin instructions. Take 0.5 mg by mouth in the morning and take 1 mg by mouth at bedtime, Disp: , Rfl:  .  moxifloxacin (AVELOX) 400 MG tablet, Take 1 tablet (400 mg total) by mouth daily at 8 pm., Disp: 30 tablet, Rfl: 5 .  naproxen sodium (ALEVE) 220 MG tablet, Take 2 tablets (440 mg total) by mouth every morning., Disp: , Rfl:  .  polyethylene glycol (MIRALAX / GLYCOLAX) packet, Take 17 g by mouth daily. , Disp: , Rfl:  .  SUMAtriptan (IMITREX) 50 MG tablet, Take 50 mg by mouth every 2 (two) hours as needed for migraine. May repeat in 2 hours if headache persists or recurs., Disp: , Rfl:  .  tiotropium (SPIRIVA HANDIHALER) 18 MCG inhalation capsule, Place 18 mcg into inhaler and inhale daily. , Disp: , Rfl:  .  traMADol (ULTRAM) 50 MG tablet, Take 50 mg by mouth every 6 (six) hours as needed for moderate pain., Disp: , Rfl:     Review of Systems  Constitutional: Negative for chills and fever.  HENT: Negative for congestion and sore throat.   Eyes: Negative for photophobia.  Respiratory: Negative for cough, chest tightness, shortness of breath and wheezing.   Cardiovascular: Negative for chest pain, palpitations and leg swelling.  Gastrointestinal: Negative for blood in stool, constipation, diarrhea, nausea and vomiting.  Genitourinary: Negative for dysuria, flank pain and hematuria.  Musculoskeletal: Positive for myalgias. Negative for back pain.  Skin: Positive for wound. Negative for color change and rash.  Neurological: Negative for dizziness, weakness and headaches.  Hematological: Does not bruise/bleed easily.  Psychiatric/Behavioral: Negative for agitation, behavioral problems, confusion, decreased concentration and suicidal ideas. The patient is not nervous/anxious.        Objective:   Physical Exam Vitals and nursing note  reviewed. Exam conducted with a chaperone present.  Constitutional:      General: She is not in acute distress.    Appearance: She is not diaphoretic.  HENT:     Head: Normocephalic and atraumatic.     Right Ear: External ear normal.     Left Ear: External ear normal.     Nose: Nose normal.     Mouth/Throat:     Pharynx: No oropharyngeal exudate.  Eyes:     General: No scleral icterus.    Conjunctiva/sclera: Conjunctivae normal.  Cardiovascular:     Rate and Rhythm: Normal rate and regular rhythm.     Heart sounds: Normal heart sounds. No murmur. No friction rub. No gallop.   Pulmonary:     Effort: Pulmonary effort is normal. No respiratory distress.     Breath sounds: Normal breath sounds. No stridor. No wheezing, rhonchi or rales.  Abdominal:     General: Bowel sounds are normal.     Palpations: Abdomen is soft.     Tenderness: There is no rebound.  Genitourinary:    Exam position: Supine.  Musculoskeletal:        General: No tenderness. Normal range of motion.  Cervical back: Normal range of motion and neck supple.  Lymphadenopathy:     Cervical: No cervical adenopathy.  Skin:    General: Skin is warm and dry.     Coloration: Skin is not jaundiced or pale.     Findings: No erythema or rash.  Neurological:     General: No focal deficit present.     Mental Status: She is alert and oriented to person, place, and time.     Coordination: Coordination normal.  Psychiatric:        Mood and Affect: Mood normal.        Behavior: Behavior normal.        Thought Content: Thought content normal.        Judgment: Judgment normal.   She had previously  significant swelling and induration in her groin  with open sores from where she has blisters  Photograph from  November 23, 2018:     12/08/2018:     01/17/19:     01/26/2019:     Exam today is 04/27/2019:     Worsening of area of induration and tenderness 06/06/2019:     July 18, 2019:    This is an  area of tenderness in the groin corresponds to the area of muscle inflammation  Assessment & Plan:    Mycobaterium fortuitum  involving right inguinal soft tissues as well as lymph nodes and abscess and infected mesh material status post removal of mesh and second surgery.  Greatly appreciate Dr. Clyda Greener hard work on [place] and assistance from Dr. Lorenda Cahill  Developed what appears to be a seroma postoperatively has been drained.  Prior showed resolution of intra-abdominal fluid collections but did show evidence of possible myositis of the abductor muscle.  She had progressive difficulty walking.  Had MRI which showed no evidence of focal fluid collections and showed reduction in the inflammation in the muscle which was more suggestive of denervation changes   She is now more than 4 months easily since her surgical intervention 3 months since the seroma was drained.  I feel comfortable discontinuing her antimycobacterial antibiotics and seeing her back in 2 months  Pain in right leg with denervation changes seen on MRI: Would recommend physical therapy but not "forever of the steroids   Aortic aneurysm: See above recommendations of Dr. Lorenda Cahill.  Patient told me Dr. Alycia Rossetti wants to reimage her with a CT scan a year after discontinuing fluoroquinolone use.  Wraps could be done sooner to alleviate her anxiety

## 2019-08-18 LAB — CBC WITH DIFFERENTIAL/PLATELET
Absolute Monocytes: 576 cells/uL (ref 200–950)
Basophils Absolute: 19 cells/uL (ref 0–200)
Basophils Relative: 0.3 %
Eosinophils Absolute: 70 cells/uL (ref 15–500)
Eosinophils Relative: 1.1 %
HCT: 37.5 % (ref 35.0–45.0)
Hemoglobin: 12.2 g/dL (ref 11.7–15.5)
Lymphs Abs: 1798 cells/uL (ref 850–3900)
MCH: 29 pg (ref 27.0–33.0)
MCHC: 32.5 g/dL (ref 32.0–36.0)
MCV: 89.1 fL (ref 80.0–100.0)
MPV: 10.5 fL (ref 7.5–12.5)
Monocytes Relative: 9 %
Neutro Abs: 3936 cells/uL (ref 1500–7800)
Neutrophils Relative %: 61.5 %
Platelets: 227 10*3/uL (ref 140–400)
RBC: 4.21 10*6/uL (ref 3.80–5.10)
RDW: 13.7 % (ref 11.0–15.0)
Total Lymphocyte: 28.1 %
WBC: 6.4 10*3/uL (ref 3.8–10.8)

## 2019-08-18 LAB — COMPLETE METABOLIC PANEL WITHOUT GFR
AG Ratio: 2.1 (calc) (ref 1.0–2.5)
ALT: 10 U/L (ref 6–29)
AST: 14 U/L (ref 10–35)
Albumin: 4 g/dL (ref 3.6–5.1)
Alkaline phosphatase (APISO): 69 U/L (ref 37–153)
BUN/Creatinine Ratio: 13 (calc) (ref 6–22)
BUN: 17 mg/dL (ref 7–25)
CO2: 25 mmol/L (ref 20–32)
Calcium: 9 mg/dL (ref 8.6–10.4)
Chloride: 105 mmol/L (ref 98–110)
Creat: 1.26 mg/dL — ABNORMAL HIGH (ref 0.60–0.93)
GFR, Est African American: 49 mL/min/1.73m2 — ABNORMAL LOW (ref >=60)
GFR, Est Non African American: 42 mL/min/1.73m2 — ABNORMAL LOW (ref >=60)
Globulin: 1.9 g/dL (ref 1.9–3.7)
Glucose, Bld: 90 mg/dL (ref 65–99)
Potassium: 4.2 mmol/L (ref 3.5–5.3)
Sodium: 137 mmol/L (ref 135–146)
Total Bilirubin: 0.2 mg/dL (ref 0.2–1.2)
Total Protein: 5.9 g/dL — ABNORMAL LOW (ref 6.1–8.1)

## 2019-08-18 LAB — SEDIMENTATION RATE: Sed Rate: 6 mm/h (ref 0–30)

## 2019-08-18 LAB — C-REACTIVE PROTEIN: CRP: 0.2 mg/L (ref <8.0)

## 2019-09-03 ENCOUNTER — Ambulatory Visit: Payer: PPO

## 2019-09-08 DIAGNOSIS — F330 Major depressive disorder, recurrent, mild: Secondary | ICD-10-CM | POA: Diagnosis not present

## 2019-09-08 DIAGNOSIS — J449 Chronic obstructive pulmonary disease, unspecified: Secondary | ICD-10-CM | POA: Diagnosis not present

## 2019-09-08 DIAGNOSIS — G894 Chronic pain syndrome: Secondary | ICD-10-CM | POA: Diagnosis not present

## 2019-09-12 DIAGNOSIS — M25561 Pain in right knee: Secondary | ICD-10-CM | POA: Diagnosis not present

## 2019-09-12 DIAGNOSIS — A498 Other bacterial infections of unspecified site: Secondary | ICD-10-CM | POA: Diagnosis not present

## 2019-09-12 DIAGNOSIS — A318 Other mycobacterial infections: Secondary | ICD-10-CM | POA: Diagnosis not present

## 2019-09-12 DIAGNOSIS — R1031 Right lower quadrant pain: Secondary | ICD-10-CM | POA: Diagnosis not present

## 2019-09-12 DIAGNOSIS — G8929 Other chronic pain: Secondary | ICD-10-CM | POA: Diagnosis not present

## 2019-09-14 ENCOUNTER — Ambulatory Visit: Payer: PPO

## 2019-09-23 DIAGNOSIS — S76011D Strain of muscle, fascia and tendon of right hip, subsequent encounter: Secondary | ICD-10-CM | POA: Diagnosis not present

## 2019-09-23 DIAGNOSIS — M25551 Pain in right hip: Secondary | ICD-10-CM | POA: Diagnosis not present

## 2019-09-26 DIAGNOSIS — M810 Age-related osteoporosis without current pathological fracture: Secondary | ICD-10-CM | POA: Diagnosis not present

## 2019-09-27 DIAGNOSIS — S76011D Strain of muscle, fascia and tendon of right hip, subsequent encounter: Secondary | ICD-10-CM | POA: Diagnosis not present

## 2019-09-28 DIAGNOSIS — M810 Age-related osteoporosis without current pathological fracture: Secondary | ICD-10-CM | POA: Diagnosis not present

## 2019-09-28 DIAGNOSIS — Z6824 Body mass index (BMI) 24.0-24.9, adult: Secondary | ICD-10-CM | POA: Diagnosis not present

## 2019-09-29 DIAGNOSIS — S76011D Strain of muscle, fascia and tendon of right hip, subsequent encounter: Secondary | ICD-10-CM | POA: Diagnosis not present

## 2019-10-04 DIAGNOSIS — S76011D Strain of muscle, fascia and tendon of right hip, subsequent encounter: Secondary | ICD-10-CM | POA: Diagnosis not present

## 2019-10-06 DIAGNOSIS — S76011D Strain of muscle, fascia and tendon of right hip, subsequent encounter: Secondary | ICD-10-CM | POA: Diagnosis not present

## 2019-10-11 DIAGNOSIS — S76011D Strain of muscle, fascia and tendon of right hip, subsequent encounter: Secondary | ICD-10-CM | POA: Diagnosis not present

## 2019-10-13 DIAGNOSIS — S76011D Strain of muscle, fascia and tendon of right hip, subsequent encounter: Secondary | ICD-10-CM | POA: Diagnosis not present

## 2019-10-14 ENCOUNTER — Other Ambulatory Visit: Payer: Self-pay | Admitting: Family Medicine

## 2019-10-14 DIAGNOSIS — Z1231 Encounter for screening mammogram for malignant neoplasm of breast: Secondary | ICD-10-CM

## 2019-10-17 ENCOUNTER — Other Ambulatory Visit: Payer: Self-pay

## 2019-10-17 ENCOUNTER — Encounter: Payer: Self-pay | Admitting: Infectious Disease

## 2019-10-17 ENCOUNTER — Ambulatory Visit (INDEPENDENT_AMBULATORY_CARE_PROVIDER_SITE_OTHER): Payer: PPO | Admitting: Infectious Disease

## 2019-10-17 VITALS — BP 146/85 | HR 88 | Temp 98.1°F | Wt 171.0 lb

## 2019-10-17 DIAGNOSIS — T8579XD Infection and inflammatory reaction due to other internal prosthetic devices, implants and grafts, subsequent encounter: Secondary | ICD-10-CM | POA: Diagnosis not present

## 2019-10-17 DIAGNOSIS — I889 Nonspecific lymphadenitis, unspecified: Secondary | ICD-10-CM | POA: Diagnosis not present

## 2019-10-17 DIAGNOSIS — I712 Thoracic aortic aneurysm, without rupture, unspecified: Secondary | ICD-10-CM

## 2019-10-17 DIAGNOSIS — A318 Other mycobacterial infections: Secondary | ICD-10-CM

## 2019-10-17 DIAGNOSIS — L02214 Cutaneous abscess of groin: Secondary | ICD-10-CM

## 2019-10-17 DIAGNOSIS — L049 Acute lymphadenitis, unspecified: Secondary | ICD-10-CM

## 2019-10-17 DIAGNOSIS — L089 Local infection of the skin and subcutaneous tissue, unspecified: Secondary | ICD-10-CM | POA: Diagnosis not present

## 2019-10-17 NOTE — Progress Notes (Signed)
Complaint: Continued pain in her thigh  Subjective:    Patient ID: Catherine [name], female    DOB: 08/27/1945, 74 y.o.   MRN: [id number]  HPI  Catherine [name] is a quite pleasant 74 year old Caucasian female with a past medical history examined for thoracic aneurysm, anxiety, social cystitis who in 2000 1617 developed a cystic lesion in her right groin.  She had this excised by general surgery on June 17 and pathology came back consistent with a lymphangioma and benign lymph nodes.  She then later underwent excision and repair of a pelvic mass which turned out on pathology be consistent with a synovial cyst, and also underwent bilateral femoral hernia repair and left inguinal hernia repair in June 2019.  In November 2019 she developed a proximal thigh mass which was excised and also found to be consistent with a synovial cyst and benign lymph nodes.  She had worsening swelling in her right groin and underwent incision and drainage of what appeared to be a seroma on September 20, 2018.  Postoperatively in the ensuing week she had worsening erythema and swelling and drainage.  She also developed overt fevers and malaise and was started on doxycycline with resolution of her fevers and malaise.  However just prior to surgery she was having worsening erythema in this area and started on doxycycline without any improvement in her symptoms.  She underwent right groin and inner thigh exploration with excision of subcutaneous lymph nodes on November 02, 2018 in the OR there was some yellow cheesy material found consistent with necrosis.  Pathology report showed necrotizing granulomatous inflammation in the soft tissue as well as the 3 lymph nodes that were excised.  Stains were negative but cultures have subsequently grown nontuberculous mycobacteria and ensuing few weeks.  This nontuberculous mycobacteria was probe not just for TB but M gordonae I and M avium and kansasii I and none of these organisms were  present.  Saw her in clinic we do not yet have the identity of the organism.  We held off on initiating antimicrobial therapy.  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  I   I felt strongly that she needed surgery with an I&D and she was [facility] and underwent formal incision and debridement by Dr. Johney Maine in May of 2020. .  Cultures yielded MRSA as well as Mycobacterium fortuitum now showing intermediate susceptibility to Zyvox.  Along the way she  Was having dyspnea and some chest discomfort since being on Zyvox.  She has some dizziness with standing.  I was concerned that she might have bone marrow toxicity from Zyvox and anemia that might explain her symptoms.  She was indeed orthostatic on checking her blood pressure from lying to standing.  However stat labs that were done and came back show her to be anemic but not sufficiently anemic to explain her symptoms.  We stopped her Zyvox and clofazimine.  Ended up going to the emergency department getting a CT angiogram that ruled out pulmonary embolism.  Since stopping the Zyvox and the clofazimine she felt much better.  Given the complicated nature of her case and in particular the difficulty navigating antibiotics I reached out to Dr. Brigitte Pulse at [facility].  Dr. Lorenda Cahill felt strongly that this was a case that needed surgical reconsideration and certainly 1 that he needed to look over in person.  She therefore traveled to [facility] on Monday and was seen by Dr. Lorenda Cahill.  Dr. Lorenda Cahill came to the conclusion after thoroughly reviewing the radiographs and the laboratory data susceptibility patterns and clinical case and feels very strongly that the patient very likely has infection of her mesh and that the mesh will need to be removed to affect cure.  Certainly if the mesh is involved and I have also had anxiety about this there is no way wanted to cure his infection without  eradication of this material.  Furthermore as Dr. Lorenda Cahill points out the antimicrobials that we are having to use against this organism are not without trivial risk to this patient.  Catherine [name] is now status post surgery on July 27 by Dr. gross with removal of infected mesh.  We have not recovered further Mycobacterium since that surgery.  She was then initiated on clofazimine and Avelox per Dr. Romie Levee recommendations.  She has been feeling much much better.  She did develop intra-abdominal fluid collection which was drained by interventional radiology on 16 September  and there is still a drain in place.  It is serous in nature and cultures that were sent for routine bacteria did not yield any organisms.  I last saw Catherine [name] she had an area of subtle induration in her abdomen which has worsened in the interim despite the drain having been removed and there being radiographic resolution of her fluid collection.  She is also been continuing on her antimycobacterial antibiotics.  We obtained an MRI of the abdomen pelvis on June 14, 2019 and this showed:   IMPRESSION: 1. Interval resolution large fluid collection within the right lower quadrant of the abdomen status post percutaneous drainage. 2. No discrete, drainable fluid collection identified at this time. 3. Increased T2 signal and enhancement within the right abductor musculature. This is nonspecific and may be inflammatory or infectious in etiology. Cannot rule out myositis. No discrete, drainable fluid collection identified at this time  Since then she was still have pain in this area we were concerned and we reimaged with an MRI of the pelvis on August 03, 2019: Which showed:  IMPRESSION: No evidence of residual or recurrent pelvic abscess.  Decreased edema and persistent atrophy of the right abductor muscles, consistent with subacute denervation changes.  Since I last saw Catherine [name] we had taken her off antibiotics.  She is  certainly not had any worsening of her symptomatology.  She continues to have pain in her groin and some numbness.  She is doing physical therapy and getting greater range of motion her leg she has had worsening edema in that leg as well.     Past Medical History:  Diagnosis Date  . Abdominal pain 06/06/2019  . Anxiety   . Aortic aneurysm ([place]) 12/08/2018  . Arthritis    HANDS  . Chronic constipation   . Complication of anesthesia    upon waking up from anesthesia patient experienced chest pain - EKG done was negative, patient states "it may have been indigestion"  . COPD (chronic obstructive pulmonary disease) ([place])   . DDD (degenerative disc disease), lumbar    L2-3/3-4/4-5  . Dizziness 01/17/2019  . DOE (dyspnea on exertion) 01/17/2019  . Dysuria   . Frequency of urination   . Ganglion cyst of right groin 11/23/2018  . GERD (gastroesophageal reflux disease)   . Hearing aid worn   . Hypertension   . Infected hernioplasty mesh ([place]) 01/26/2019  . Migraine   . Mild emphysema ([place])   . 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 Osteopenia 2019   TO OSTEOPOROSIS   .  Pelvic pain in female   . Seroma, post-traumatic ([place]) 04/27/2019  . Smokers' cough ([employer])   . Thoracic aortic aneurysm (TAA) ([facility])    FOLLOWED BY DR BARTLE  LAST CT CHEST  WAS 6-3 SEE EPIC   . Urgency of urination    h/o INTERSTIAL CYSTITIS...DR. HUMPHRIES/GRAPEY  . Wears glasses     Past Surgical History:  Procedure Laterality Date  . ABDOMINAL HYSTERECTOMY  1982   W/  UNILATERAL SALPINGOOPHORECTOMY  . bilateral hernia surgery      removed ganglion cyst and swollen lymph node   . CHOLECYSTECTOMY OPEN  1987   W/  APPENDECTOMY  . COLONOSCOPY  2002  &  12-2012 & 03/19/15  . CYSTO WITH HYDRODISTENSION N/A 02/20/2014   Procedure: Lee AND MARCAINE INSTILLATION;  Surgeon: Bernestine Amass, MD;  Location: [facility];  Service: Urology;  Laterality: N/A;  . EXCISION  MASS LOWER EXTREMETIES Right 11/02/2018   Procedure: REMOVAL OF RIGHT GROIN AND THIGH SUBCUTANEOUS MASSES.;  Surgeon: Michael Boston, MD;  Location: [place];  Service: General;  Laterality: Right;  . EYE SURGERY     bilateral cataract surgery   . GROIN DISSECTION N/A 02/28/2019   Procedure: Virl Son EXPLORATION AND DEBRIDEMENT;  Surgeon: Michael Boston, MD;  Location: [facility];  Service: General;  Laterality: N/A;  . INCISION AND DRAINAGE ABSCESS N/A 12/09/2018   Procedure: INCISION AND DRAINAGE RIGHT GROIN and thigh  WITH DEBRIDEMENT;  Surgeon: Michael Boston, MD;  Location: [facility];  Service: General;  Laterality: N/A;  . IR CATHETER TUBE CHANGE  05/05/2019  . IR RADIOLOGIST EVAL & MGMT  05/03/2019  . IR RADIOLOGIST EVAL & MGMT  05/10/2019  . LAPAROSCOPIC ABDOMINAL EXPLORATION Right 02/28/2019   Procedure: LAPAROSCOPIC EXPLORATION WITH REMOVAL OF RIGHT PREPERITONEAL MESH;  Surgeon: Michael Boston, MD;  Location: [facility];  Service: General;  Laterality: Right;  . LAPAROSCOPIC LYSIS OF ADHESIONS Bilateral 02/28/2019   Procedure: LAPAROSCOPIC LYSIS OF ADHESIONS;  Surgeon: Michael Boston, MD;  Location: [facility];  Service: General;  Laterality: Bilateral;  . LAPAROSCOPIC UNILATERAL SALPINGOOPHORECTOMY  1984  . LESION REMOVAL Right 02/28/2019   Procedure: EXCISION VAGINAL ABSCESS  AND DISSECTION;  Surgeon: Michael Boston, MD;  Location: [facility];  Service: General;  Laterality: Right;  . MASS EXCISION Right 06/16/2018   Procedure: REMOVAL OF RIGHT GROIN SUBCUTANEOUS MASS ERAS PATHWAY;  Surgeon: Michael Boston, MD;  Location: [facility];  Service: General;  Laterality: Right;  . removal of lymph node cyst in right groin     . ROTATOR CUFF REPAIR Right 2003   and BONE SPUR    Family History  Problem Relation Age of Onset  . Esophageal cancer Mother 98  . Lung cancer Father 19      Social History   Socioeconomic History  . Marital status: Married    Spouse name: Not on file  . Number of children: Not on file  . Years  of education: Not on file  . Highest education level: Not on file  Occupational History  . Not on file  Tobacco Use  . Smoking status: Former Smoker    Packs/day: 0.75    Years: 48.00    Pack years: 36.00    Types: Cigarettes    Quit date: 09/04/2017    Years since quitting: 2.1  . Smokeless tobacco: Never Used  Substance and Sexual Activity  . Alcohol use: No  . Drug use: No  . Sexual activity: Not on file  Other Topics Concern  . Not on file  Social History Narrative  . Not on file   Social Determinants of Health   Financial Resource Strain:   . Difficulty of Paying Living Expenses:   Food Insecurity:   . Worried About Charity fundraiser in the Last Year:   . Arboriculturist in the Last Year:   Transportation Needs:   . Film/video editor (Medical):   Marland Kitchen Lack of Transportation (Non-Medical):   Physical Activity:   . Days of Exercise per Week:   . Minutes of Exercise per Session:   Stress:   . Feeling of Stress :   Social Connections:   . Frequency of Communication with Friends and Family:   . Frequency of Social Gatherings with Friends and Family:   . Attends Religious Services:   . Active Member of Clubs or Organizations:   . Attends Archivist Meetings:   Marland Kitchen Marital Status:     Allergies  Allergen Reactions  . Ciprofloxacin Hcl Other (See Comments)    Aortic aneurysm per MD  . Metoprolol Other (See Comments) and Cough    and wheezing  . Penicillins Shortness Of Breath, Rash and Other (See Comments)    Has patient had a PCN reaction causing immediate rash, facial/tongue/throat swelling, SOB or lightheadedness with hypotension: Yes Has patient had a PCN reaction causing severe rash involving mucus membranes or skin necrosis: No Has patient had a PCN reaction that required hospitalization: Yes - MD office Has patient had a PCN reaction occurring within the last 10 years: No If all of the above answers are "NO", then may proceed with Cephalosporin  use.   Marland Kitchen Oxycodone Other (See Comments)    Nightmares.  Tolerates tramadol  . Advair Diskus [Fluticasone-Salmeterol] Anxiety     Current Outpatient Medications:  .  acetaminophen (TYLENOL) 500 MG tablet, Take 1,000 mg by mouth 2 (two) times a day. , Disp: , Rfl:  .  albuterol (PROVENTIL HFA;VENTOLIN HFA) 108 (90 Base) MCG/ACT inhaler, Inhale 2 puffs into the lungs every 4 (four) hours as needed for wheezing or shortness of breath., Disp: , Rfl:  .  AMBULATORY NON FORMULARY MEDICATION, Take 2 capsules by mouth daily. Medication Name: clofazimine, Disp: 60 capsule, Rfl: 11 .  budesonide-formoterol (SYMBICORT) 160-4.5 MCG/ACT inhaler, Inhale 2 puffs into the lungs 2 (two) times daily., Disp: , Rfl:  .  CALCIUM CARBONATE ANTACID PO, Take 1,000 mg by mouth daily. , Disp: , Rfl:  .  carboxymethylcellulose (REFRESH PLUS) 0.5 % SOLN, Place 2 drops into both eyes 3 (three) times daily. , Disp: , Rfl:  .  Carboxymethylcellulose Sodium (REFRESH LIQUIGEL) 1 % GEL, Place 1 drop into both eyes at bedtime., Disp: , Rfl:  .  Cholecalciferol (VITAMIN D3) 5000 units CAPS, Take 5,000 Units by mouth daily. , Disp: , Rfl:  .  Cyanocobalamin (VITAMIN B-12 PO), Take 2,000 mcg by mouth daily. , Disp: , Rfl:  .  denosumab (PROLIA) 60 MG/ML SOSY injection, Inject 60 mg into the skin every 6 (six) months., Disp: , Rfl:  .  docusate sodium (COLACE) 100 MG capsule, Take 200 mg by mouth daily. , Disp: , Rfl:  .  esomeprazole (NEXIUM) 20 MG capsule, Take 20 mg by mouth daily., Disp: , Rfl:  .  fluconazole (DIFLUCAN) 150 MG tablet, TAKE 1 TABLET BY MOUTH AS ONE DOSE, Disp: 1 tablet, Rfl: 5 .  fluticasone (FLONASE) 50 MCG/ACT nasal spray, Place 1 spray into both  nostrils daily. , Disp: , Rfl:  .  gabapentin (NEURONTIN) 100 MG capsule, Take 100 mg by mouth at bedtime. , Disp: , Rfl:  .  LORazepam (ATIVAN) 1 MG tablet, Take 0.5-1 mg by mouth See admin instructions. Take 0.5 mg by mouth in the morning and take 1 mg by mouth at  bedtime, Disp: , Rfl:  .  naproxen sodium (ALEVE) 220 MG tablet, Take 2 tablets (440 mg total) by mouth every morning., Disp: , Rfl:  .  polyethylene glycol (MIRALAX / GLYCOLAX) packet, Take 17 g by mouth daily. , Disp: , Rfl:  .  SUMAtriptan (IMITREX) 50 MG tablet, Take 50 mg by mouth every 2 (two) hours as needed for migraine. May repeat in 2 hours if headache persists or recurs., Disp: , Rfl:  .  tiotropium (SPIRIVA HANDIHALER) 18 MCG inhalation capsule, Place 18 mcg into inhaler and inhale daily. , Disp: , Rfl:  .  traMADol (ULTRAM) 50 MG tablet, Take 50 mg by mouth every 6 (six) hours as needed for moderate pain., Disp: , Rfl:     Review of Systems  Constitutional: Negative for chills and fever.  HENT: Negative for congestion and sore throat.   Eyes: Negative for photophobia.  Respiratory: Negative for cough, chest tightness, shortness of breath and wheezing.   Cardiovascular: Negative for chest pain, palpitations and leg swelling.  Gastrointestinal: Negative for blood in stool, constipation, diarrhea, nausea and vomiting.  Genitourinary: Negative for dysuria, flank pain and hematuria.  Musculoskeletal: Positive for myalgias. Negative for back pain.  Skin: Positive for wound. Negative for color change and rash.  Neurological: Negative for dizziness, weakness and headaches.  Hematological: Does not bruise/bleed easily.  Psychiatric/Behavioral: Negative for agitation, behavioral problems, confusion, decreased concentration and suicidal ideas. The patient is not nervous/anxious.        Objective:   Physical Exam Vitals and nursing note reviewed. Exam conducted with a chaperone present.  Constitutional:      General: She is not in acute distress.    Appearance: She is not diaphoretic.  HENT:     Head: Normocephalic and atraumatic.     Right Ear: External ear normal.     Left Ear: External ear normal.     Nose: Nose normal.     Mouth/Throat:     Pharynx: No oropharyngeal exudate.   Eyes:     General: No scleral icterus.    Conjunctiva/sclera: Conjunctivae normal.  Cardiovascular:     Rate and Rhythm: Normal rate and regular rhythm.     Heart sounds: Normal heart sounds. No murmur. No friction rub. No gallop.   Pulmonary:     Effort: Pulmonary effort is normal. No respiratory distress.     Breath sounds: Normal breath sounds. No stridor. No wheezing, rhonchi or rales.  Abdominal:     General: Bowel sounds are normal.     Palpations: Abdomen is soft.     Tenderness: There is no rebound.  Genitourinary:    Exam position: Supine.  Musculoskeletal:        General: No tenderness. Normal range of motion.     Cervical back: Normal range of motion and neck supple.  Lymphadenopathy:     Cervical: No cervical adenopathy.  Skin:    General: Skin is warm and dry.     Coloration: Skin is not jaundiced or pale.     Findings: No erythema or rash.  Neurological:     General: No focal deficit present.     Mental  Status: She is alert and oriented to person, place, and time.     Coordination: Coordination normal.  Psychiatric:        Mood and Affect: Mood normal.        Behavior: Behavior normal.        Thought Content: Thought content normal.        Judgment: Judgment normal.    Prior surgical wounds not examined today         Assessment & Plan:    Mycobaterium fortuitum  involving right inguinal soft tissues as well as lymph nodes and abscess and infected mesh material status post removal of mesh and second surgery.  Greatly appreciate Dr. Clyda Greener hard work on [place] and assistance from Dr. Lorenda Cahill  Developed what appears to be a seroma postoperatively has been drained.  Prior showed resolution of intra-abdominal fluid collections but did show evidence of possible myositis of the abductor muscle.  She had progressive difficulty walking.  Had MRI which showed no evidence of focal fluid collections and showed reduction in the inflammation in the muscle which  was more suggestive of denervation changes   She was now more than 4 months easily since her surgical intervention 3 months since the seroma was drained.  I felt comfortable discontinuing her antimycobacterial antibiotics and she has done well in the intervening 2 months with no worsening of her symptoms  Pain in right leg with denervation changes seen on MRI: Would recommend physical therapy and she is following at [facility] orthopedics   Aortic aneurysm: See above recommendations of Dr. Lorenda Cahill.  Patient told me Dr. Alycia Rossetti wants to reimage her with a CT scan a year after discontinuing fluoroquinolone use.

## 2019-10-18 DIAGNOSIS — S76011D Strain of muscle, fascia and tendon of right hip, subsequent encounter: Secondary | ICD-10-CM | POA: Diagnosis not present

## 2019-10-18 LAB — CBC WITH DIFFERENTIAL/PLATELET
Absolute Monocytes: 408 cells/uL (ref 200–950)
Basophils Absolute: 19 cells/uL (ref 0–200)
Basophils Relative: 0.4 %
Eosinophils Absolute: 91 cells/uL (ref 15–500)
Eosinophils Relative: 1.9 %
HCT: 36.8 % (ref 35.0–45.0)
Hemoglobin: 12.4 g/dL (ref 11.7–15.5)
Lymphs Abs: 1387 cells/uL (ref 850–3900)
MCH: 30.3 pg (ref 27.0–33.0)
MCHC: 33.7 g/dL (ref 32.0–36.0)
MCV: 90 fL (ref 80.0–100.0)
MPV: 10.2 fL (ref 7.5–12.5)
Monocytes Relative: 8.5 %
Neutro Abs: 2894 cells/uL (ref 1500–7800)
Neutrophils Relative %: 60.3 %
Platelets: 218 10*3/uL (ref 140–400)
RBC: 4.09 10*6/uL (ref 3.80–5.10)
RDW: 13.5 % (ref 11.0–15.0)
Total Lymphocyte: 28.9 %
WBC: 4.8 10*3/uL (ref 3.8–10.8)

## 2019-10-18 LAB — BASIC METABOLIC PANEL WITH GFR
BUN: 17 mg/dL (ref 7–25)
CO2: 25 mmol/L (ref 20–32)
Calcium: 8.7 mg/dL (ref 8.6–10.4)
Chloride: 106 mmol/L (ref 98–110)
Creat: 0.93 mg/dL (ref 0.60–0.93)
GFR, Est African American: 71 mL/min/{1.73_m2} (ref >=60)
GFR, Est Non African American: 61 mL/min/{1.73_m2} (ref >=60)
Glucose, Bld: 93 mg/dL (ref 65–99)
Potassium: 4.4 mmol/L (ref 3.5–5.3)
Sodium: 137 mmol/L (ref 135–146)

## 2019-10-18 LAB — SEDIMENTATION RATE: Sed Rate: 6 mm/h (ref 0–30)

## 2019-10-18 LAB — C-REACTIVE PROTEIN: CRP: 0.2 mg/L (ref <8.0)

## 2019-10-20 DIAGNOSIS — S76011D Strain of muscle, fascia and tendon of right hip, subsequent encounter: Secondary | ICD-10-CM | POA: Diagnosis not present

## 2019-10-21 DIAGNOSIS — M7051 Other bursitis of knee, right knee: Secondary | ICD-10-CM | POA: Diagnosis not present

## 2019-10-21 DIAGNOSIS — M25551 Pain in right hip: Secondary | ICD-10-CM | POA: Diagnosis not present

## 2019-10-21 DIAGNOSIS — M25561 Pain in right knee: Secondary | ICD-10-CM | POA: Diagnosis not present

## 2019-10-25 DIAGNOSIS — S76011D Strain of muscle, fascia and tendon of right hip, subsequent encounter: Secondary | ICD-10-CM | POA: Diagnosis not present

## 2019-10-28 DIAGNOSIS — S76011D Strain of muscle, fascia and tendon of right hip, subsequent encounter: Secondary | ICD-10-CM | POA: Diagnosis not present

## 2019-11-01 DIAGNOSIS — S76011D Strain of muscle, fascia and tendon of right hip, subsequent encounter: Secondary | ICD-10-CM | POA: Diagnosis not present

## 2019-11-03 DIAGNOSIS — S76011D Strain of muscle, fascia and tendon of right hip, subsequent encounter: Secondary | ICD-10-CM | POA: Diagnosis not present

## 2019-11-08 DIAGNOSIS — S76011D Strain of muscle, fascia and tendon of right hip, subsequent encounter: Secondary | ICD-10-CM | POA: Diagnosis not present

## 2019-11-11 DIAGNOSIS — S76011D Strain of muscle, fascia and tendon of right hip, subsequent encounter: Secondary | ICD-10-CM | POA: Diagnosis not present

## 2019-11-15 DIAGNOSIS — S76011D Strain of muscle, fascia and tendon of right hip, subsequent encounter: Secondary | ICD-10-CM | POA: Diagnosis not present

## 2019-11-18 DIAGNOSIS — S76011D Strain of muscle, fascia and tendon of right hip, subsequent encounter: Secondary | ICD-10-CM | POA: Diagnosis not present

## 2019-11-21 DIAGNOSIS — M7051 Other bursitis of knee, right knee: Secondary | ICD-10-CM | POA: Diagnosis not present

## 2019-11-21 DIAGNOSIS — M25551 Pain in right hip: Secondary | ICD-10-CM | POA: Diagnosis not present

## 2019-11-22 ENCOUNTER — Other Ambulatory Visit (HOSPITAL_COMMUNITY): Payer: Self-pay | Admitting: Orthopaedic Surgery

## 2019-11-22 ENCOUNTER — Other Ambulatory Visit: Payer: Self-pay | Admitting: Orthopaedic Surgery

## 2019-11-22 DIAGNOSIS — M79651 Pain in right thigh: Secondary | ICD-10-CM

## 2019-11-22 DIAGNOSIS — M25561 Pain in right knee: Secondary | ICD-10-CM

## 2019-11-28 ENCOUNTER — Ambulatory Visit
Admission: RE | Admit: 2019-11-28 | Discharge: 2019-11-28 | Disposition: A | Discharge status: Home / Self Care | Payer: PPO | Source: Ambulatory Visit | Attending: Family Medicine | Admitting: Family Medicine

## 2019-11-28 ENCOUNTER — Other Ambulatory Visit: Payer: Self-pay

## 2019-11-28 DIAGNOSIS — Z1231 Encounter for screening mammogram for malignant neoplasm of breast: Secondary | ICD-10-CM

## 2019-11-29 DIAGNOSIS — S76011D Strain of muscle, fascia and tendon of right hip, subsequent encounter: Secondary | ICD-10-CM | POA: Diagnosis not present

## 2019-12-06 ENCOUNTER — Ambulatory Visit (HOSPITAL_COMMUNITY)
Admission: RE | Admit: 2019-12-06 | Discharge: 2019-12-06 | Disposition: A | Discharge status: Home / Self Care | Payer: PPO | Source: Ambulatory Visit | Attending: Orthopaedic Surgery | Admitting: Orthopaedic Surgery

## 2019-12-06 ENCOUNTER — Other Ambulatory Visit: Payer: Self-pay

## 2019-12-06 DIAGNOSIS — M79604 Pain in right leg: Secondary | ICD-10-CM | POA: Diagnosis not present

## 2019-12-06 DIAGNOSIS — M25561 Pain in right knee: Secondary | ICD-10-CM

## 2019-12-06 DIAGNOSIS — M79651 Pain in right thigh: Secondary | ICD-10-CM | POA: Diagnosis not present

## 2019-12-09 DIAGNOSIS — M25561 Pain in right knee: Secondary | ICD-10-CM | POA: Diagnosis not present

## 2019-12-19 DIAGNOSIS — M25561 Pain in right knee: Secondary | ICD-10-CM | POA: Diagnosis not present

## 2019-12-25 DIAGNOSIS — J329 Chronic sinusitis, unspecified: Secondary | ICD-10-CM | POA: Diagnosis not present

## 2020-01-06 DIAGNOSIS — Z79899 Other long term (current) drug therapy: Secondary | ICD-10-CM | POA: Diagnosis not present

## 2020-01-06 DIAGNOSIS — Z01818 Encounter for other preprocedural examination: Secondary | ICD-10-CM | POA: Diagnosis not present

## 2020-01-06 DIAGNOSIS — M25861 Other specified joint disorders, right knee: Secondary | ICD-10-CM | POA: Diagnosis not present

## 2020-01-06 DIAGNOSIS — S83206A Unspecified tear of unspecified meniscus, current injury, right knee, initial encounter: Secondary | ICD-10-CM | POA: Diagnosis not present

## 2020-01-06 DIAGNOSIS — G43109 Migraine with aura, not intractable, without status migrainosus: Secondary | ICD-10-CM | POA: Diagnosis not present

## 2020-01-25 DIAGNOSIS — H90A21 Sensorineural hearing loss, unilateral, right ear, with restricted hearing on the contralateral side: Secondary | ICD-10-CM | POA: Diagnosis not present

## 2020-01-25 DIAGNOSIS — H9313 Tinnitus, bilateral: Secondary | ICD-10-CM | POA: Diagnosis not present

## 2020-01-25 DIAGNOSIS — Z570 Occupational exposure to noise: Secondary | ICD-10-CM | POA: Diagnosis not present

## 2020-01-25 DIAGNOSIS — H90A32 Mixed conductive and sensorineural hearing loss, unilateral, left ear with restricted hearing on the contralateral side: Secondary | ICD-10-CM | POA: Diagnosis not present

## 2020-01-31 DIAGNOSIS — X58XXXA Exposure to other specified factors, initial encounter: Secondary | ICD-10-CM | POA: Diagnosis not present

## 2020-01-31 DIAGNOSIS — S83231A Complex tear of medial meniscus, current injury, right knee, initial encounter: Secondary | ICD-10-CM | POA: Diagnosis not present

## 2020-01-31 DIAGNOSIS — M1711 Unilateral primary osteoarthritis, right knee: Secondary | ICD-10-CM | POA: Diagnosis not present

## 2020-01-31 DIAGNOSIS — Y999 Unspecified external cause status: Secondary | ICD-10-CM | POA: Diagnosis not present

## 2020-01-31 DIAGNOSIS — G8918 Other acute postprocedural pain: Secondary | ICD-10-CM | POA: Diagnosis not present

## 2020-02-08 DIAGNOSIS — Z9889 Other specified postprocedural states: Secondary | ICD-10-CM | POA: Diagnosis not present

## 2020-02-14 DIAGNOSIS — S83261D Peripheral tear of lateral meniscus, current injury, right knee, subsequent encounter: Secondary | ICD-10-CM | POA: Diagnosis not present

## 2020-02-14 DIAGNOSIS — M2341 Loose body in knee, right knee: Secondary | ICD-10-CM | POA: Diagnosis not present

## 2020-02-15 ENCOUNTER — Ambulatory Visit (HOSPITAL_COMMUNITY)
Admission: RE | Admit: 2020-02-15 | Discharge: 2020-02-15 | Disposition: A | Discharge status: Home / Self Care | Payer: PPO | Source: Ambulatory Visit | Attending: Vascular Surgery | Admitting: Vascular Surgery

## 2020-02-15 ENCOUNTER — Other Ambulatory Visit (HOSPITAL_COMMUNITY): Payer: Self-pay | Admitting: Orthopaedic Surgery

## 2020-02-15 ENCOUNTER — Other Ambulatory Visit: Payer: Self-pay

## 2020-02-15 ENCOUNTER — Ambulatory Visit: Payer: PPO | Admitting: Infectious Disease

## 2020-02-15 DIAGNOSIS — R609 Edema, unspecified: Secondary | ICD-10-CM

## 2020-02-17 DIAGNOSIS — M2341 Loose body in knee, right knee: Secondary | ICD-10-CM | POA: Diagnosis not present

## 2020-02-17 DIAGNOSIS — S83261D Peripheral tear of lateral meniscus, current injury, right knee, subsequent encounter: Secondary | ICD-10-CM | POA: Diagnosis not present

## 2020-02-20 DIAGNOSIS — S83261D Peripheral tear of lateral meniscus, current injury, right knee, subsequent encounter: Secondary | ICD-10-CM | POA: Diagnosis not present

## 2020-02-20 DIAGNOSIS — M2341 Loose body in knee, right knee: Secondary | ICD-10-CM | POA: Diagnosis not present

## 2020-02-24 DIAGNOSIS — M2341 Loose body in knee, right knee: Secondary | ICD-10-CM | POA: Diagnosis not present

## 2020-02-24 DIAGNOSIS — S83261D Peripheral tear of lateral meniscus, current injury, right knee, subsequent encounter: Secondary | ICD-10-CM | POA: Diagnosis not present

## 2020-02-27 DIAGNOSIS — S83261D Peripheral tear of lateral meniscus, current injury, right knee, subsequent encounter: Secondary | ICD-10-CM | POA: Diagnosis not present

## 2020-02-27 DIAGNOSIS — M2341 Loose body in knee, right knee: Secondary | ICD-10-CM | POA: Diagnosis not present

## 2020-03-01 DIAGNOSIS — S83261D Peripheral tear of lateral meniscus, current injury, right knee, subsequent encounter: Secondary | ICD-10-CM | POA: Diagnosis not present

## 2020-03-01 DIAGNOSIS — M2341 Loose body in knee, right knee: Secondary | ICD-10-CM | POA: Diagnosis not present

## 2020-03-02 DIAGNOSIS — Z9889 Other specified postprocedural states: Secondary | ICD-10-CM | POA: Diagnosis not present

## 2020-03-06 DIAGNOSIS — S83261D Peripheral tear of lateral meniscus, current injury, right knee, subsequent encounter: Secondary | ICD-10-CM | POA: Diagnosis not present

## 2020-03-06 DIAGNOSIS — M2341 Loose body in knee, right knee: Secondary | ICD-10-CM | POA: Diagnosis not present

## 2020-03-08 DIAGNOSIS — M2341 Loose body in knee, right knee: Secondary | ICD-10-CM | POA: Diagnosis not present

## 2020-03-08 DIAGNOSIS — S83261D Peripheral tear of lateral meniscus, current injury, right knee, subsequent encounter: Secondary | ICD-10-CM | POA: Diagnosis not present

## 2020-03-16 ENCOUNTER — Other Ambulatory Visit: Payer: Self-pay

## 2020-03-16 ENCOUNTER — Ambulatory Visit: Payer: PPO | Admitting: Physician Assistant

## 2020-03-16 ENCOUNTER — Encounter: Payer: Self-pay | Admitting: Physician Assistant

## 2020-03-16 DIAGNOSIS — L820 Inflamed seborrheic keratosis: Secondary | ICD-10-CM | POA: Diagnosis not present

## 2020-03-16 DIAGNOSIS — D485 Neoplasm of uncertain behavior of skin: Secondary | ICD-10-CM

## 2020-03-16 DIAGNOSIS — D1800 Hemangioma unspecified site: Secondary | ICD-10-CM | POA: Diagnosis not present

## 2020-03-16 DIAGNOSIS — Z85828 Personal history of other malignant neoplasm of skin: Secondary | ICD-10-CM

## 2020-03-16 DIAGNOSIS — L821 Other seborrheic keratosis: Secondary | ICD-10-CM | POA: Diagnosis not present

## 2020-03-16 DIAGNOSIS — Z1283 Encounter for screening for malignant neoplasm of skin: Secondary | ICD-10-CM | POA: Diagnosis not present

## 2020-03-16 DIAGNOSIS — L814 Other melanin hyperpigmentation: Secondary | ICD-10-CM | POA: Diagnosis not present

## 2020-03-16 DIAGNOSIS — L578 Other skin changes due to chronic exposure to nonionizing radiation: Secondary | ICD-10-CM

## 2020-03-16 NOTE — Patient Instructions (Signed)
Biopsy, Surgery (Curettage) & Surgery (Excision) Aftercare Instructions  1. Okay to remove bandage in 24 hours  2. Wash area with soap and water  3. Apply Vaseline to area twice daily until healed (Not Neosporin)  4. Okay to cover with a Band-Aid to decrease the chance of infection or prevent irritation from clothing; also it's okay to uncover lesion at home.  5. Suture instructions: return to our office in 7-10 or 10-14 days for a nurse visit for suture removal. Variable healing with sutures, if pain or itching occurs call our office. It's okay to shower or bathe 24 hours after sutures are given.  6. The following risks may occur after a biopsy, curettage or excision: bleeding, scarring, discoloration, recurrence, infection (redness, yellow drainage, pain or swelling).  7. If your results are positive we will contact you, if you do not hear from [place] review your MyChart.  Stay Well

## 2020-03-20 NOTE — Progress Notes (Signed)
   Follow-Up Visit   Subjective  Catherine [name] is a 74 y.o. female who presents for the following: Annual Exam.   The following portions of the chart were reviewed this encounter and updated as appropriate: Tobacco  Allergies  Meds  Problems  Med Hx  Surg Hx  Fam Hx      Objective  Well appearing patient in no apparent distress; mood and affect are within normal limits.  A full examination was performed including scalp, head, eyes, ears, nose, lips, neck, chest, axillae, abdomen, back, buttocks, bilateral upper extremities, bilateral lower extremities, hands, feet, fingers, toes, fingernails, and toenails. All findings within normal limits unless otherwise noted below.  Objective  Head - to toe: All scars clear.No atypical nev. No signs of non-mole skin cancer.   Objective  Scars clear  Right jawline: Scar clear  Right scalp/post: Scar clear  Objective  Right side of neck: Thick dark crust   Assessment & Plan  Skin exam for malignant neoplasm Head - to toe  observe  History of basal cell carcinoma (BCC) (2) Right scalp/post; Right jawline  observe  Neoplasm of uncertain behavior of skin Right side of neck  Skin / nail biopsy Type of biopsy: tangential   Informed consent: discussed and consent obtained   Timeout: patient name, date of birth, surgical site, and procedure verified   Anesthesia: the lesion was anesthetized in a standard fashion   Anesthetic:  1% lidocaine w/ epinephrine 1-100,000 local infiltration Instrument used: flexible razor blade   Hemostasis achieved with: aluminum chloride and electrodesiccation   Outcome: patient tolerated procedure well   Post-procedure details: wound care instructions given    Specimen 1 - Surgical pathology Differential Diagnosis: ISK Check Margins: No Curette, cautery Lentigines - Scattered tan macules - Discussed due to sun exposure - Benign, observe - Call for any changes  Seborrheic Keratoses -  Stuck-on, waxy, tan-brown papules and plaques  - Discussed benign etiology and prognosis. - Observe - Call for any changes   Hemangiomas - Red papules - Discussed benign nature - Observe - Call for any changes  Actinic Damage - diffuse scaly erythematous macules with underlying dyspigmentation - Recommend daily broad spectrum sunscreen SPF 30+ to sun-exposed areas, reapply every 2 hours as needed.  - Call for new or changing lesions.  Skin cancer screening performed today.   I, Saige Busby, PA-C, have reviewed all documentation's for this visit.  The documentation on 03/20/20 for the exam, diagnosis, procedures and orders are all accurate and complete.

## 2020-03-21 DIAGNOSIS — F419 Anxiety disorder, unspecified: Secondary | ICD-10-CM | POA: Diagnosis not present

## 2020-03-21 DIAGNOSIS — G43909 Migraine, unspecified, not intractable, without status migrainosus: Secondary | ICD-10-CM | POA: Diagnosis not present

## 2020-03-21 DIAGNOSIS — R911 Solitary pulmonary nodule: Secondary | ICD-10-CM | POA: Diagnosis not present

## 2020-03-21 DIAGNOSIS — E538 Deficiency of other specified B group vitamins: Secondary | ICD-10-CM | POA: Diagnosis not present

## 2020-03-21 DIAGNOSIS — Z0000 Encounter for general adult medical examination without abnormal findings: Secondary | ICD-10-CM | POA: Diagnosis not present

## 2020-03-21 DIAGNOSIS — Z79899 Other long term (current) drug therapy: Secondary | ICD-10-CM | POA: Diagnosis not present

## 2020-03-21 DIAGNOSIS — I10 Essential (primary) hypertension: Secondary | ICD-10-CM | POA: Diagnosis not present

## 2020-03-21 DIAGNOSIS — E785 Hyperlipidemia, unspecified: Secondary | ICD-10-CM | POA: Diagnosis not present

## 2020-03-21 DIAGNOSIS — E559 Vitamin D deficiency, unspecified: Secondary | ICD-10-CM | POA: Diagnosis not present

## 2020-03-21 DIAGNOSIS — J449 Chronic obstructive pulmonary disease, unspecified: Secondary | ICD-10-CM | POA: Diagnosis not present

## 2020-03-21 DIAGNOSIS — N3010 Interstitial cystitis (chronic) without hematuria: Secondary | ICD-10-CM | POA: Diagnosis not present

## 2020-03-21 DIAGNOSIS — I712 Thoracic aortic aneurysm, without rupture: Secondary | ICD-10-CM | POA: Diagnosis not present

## 2020-03-26 DIAGNOSIS — M810 Age-related osteoporosis without current pathological fracture: Secondary | ICD-10-CM | POA: Diagnosis not present

## 2020-03-30 DIAGNOSIS — M25561 Pain in right knee: Secondary | ICD-10-CM | POA: Diagnosis not present

## 2020-03-30 DIAGNOSIS — Z9889 Other specified postprocedural states: Secondary | ICD-10-CM | POA: Diagnosis not present

## 2020-04-23 DIAGNOSIS — Z9889 Other specified postprocedural states: Secondary | ICD-10-CM | POA: Diagnosis not present

## 2020-04-25 ENCOUNTER — Other Ambulatory Visit: Payer: Self-pay | Admitting: Orthopaedic Surgery

## 2020-04-25 DIAGNOSIS — M25561 Pain in right knee: Secondary | ICD-10-CM

## 2020-05-03 DIAGNOSIS — J0100 Acute maxillary sinusitis, unspecified: Secondary | ICD-10-CM | POA: Diagnosis not present

## 2020-05-07 DIAGNOSIS — H04123 Dry eye syndrome of bilateral lacrimal glands: Secondary | ICD-10-CM | POA: Diagnosis not present

## 2020-05-07 DIAGNOSIS — H40011 Open angle with borderline findings, low risk, right eye: Secondary | ICD-10-CM | POA: Diagnosis not present

## 2020-05-07 DIAGNOSIS — H26491 Other secondary cataract, right eye: Secondary | ICD-10-CM | POA: Diagnosis not present

## 2020-05-07 DIAGNOSIS — Z961 Presence of intraocular lens: Secondary | ICD-10-CM | POA: Diagnosis not present

## 2020-05-08 DIAGNOSIS — Z23 Encounter for immunization: Secondary | ICD-10-CM | POA: Diagnosis not present

## 2020-05-14 ENCOUNTER — Other Ambulatory Visit: Payer: Self-pay

## 2020-05-14 ENCOUNTER — Ambulatory Visit
Admission: RE | Admit: 2020-05-14 | Discharge: 2020-05-14 | Disposition: A | Discharge status: Home / Self Care | Payer: PPO | Source: Ambulatory Visit | Attending: Orthopaedic Surgery | Admitting: Orthopaedic Surgery

## 2020-05-14 DIAGNOSIS — S83281A Other tear of lateral meniscus, current injury, right knee, initial encounter: Secondary | ICD-10-CM | POA: Diagnosis not present

## 2020-05-14 DIAGNOSIS — M7121 Synovial cyst of popliteal space [Baker], right knee: Secondary | ICD-10-CM | POA: Diagnosis not present

## 2020-05-14 DIAGNOSIS — M25561 Pain in right knee: Secondary | ICD-10-CM

## 2020-05-14 DIAGNOSIS — R2241 Localized swelling, mass and lump, right lower limb: Secondary | ICD-10-CM | POA: Diagnosis not present

## 2020-05-14 MED ORDER — GADOBENATE DIMEGLUMINE 529 MG/ML IV SOLN
14.0000 mL | Freq: Once | INTRAVENOUS | Status: AC | PRN
  Administered 2020-05-14: 14 mL via INTRAVENOUS

## 2020-05-18 DIAGNOSIS — M25561 Pain in right knee: Secondary | ICD-10-CM | POA: Diagnosis not present

## 2020-06-04 DIAGNOSIS — M25561 Pain in right knee: Secondary | ICD-10-CM | POA: Diagnosis not present

## 2020-06-06 ENCOUNTER — Ambulatory Visit: Payer: PPO | Attending: Internal Medicine

## 2020-06-06 ENCOUNTER — Other Ambulatory Visit (HOSPITAL_COMMUNITY): Payer: Self-pay | Admitting: Internal Medicine

## 2020-06-06 DIAGNOSIS — Z23 Encounter for immunization: Secondary | ICD-10-CM

## 2020-06-18 DIAGNOSIS — M25561 Pain in right knee: Secondary | ICD-10-CM | POA: Diagnosis not present

## 2020-06-25 DIAGNOSIS — M25561 Pain in right knee: Secondary | ICD-10-CM | POA: Diagnosis not present

## 2020-08-06 ENCOUNTER — Other Ambulatory Visit: Payer: Self-pay | Admitting: *Deleted

## 2020-08-06 DIAGNOSIS — I712 Thoracic aortic aneurysm, without rupture, unspecified: Secondary | ICD-10-CM

## 2020-08-24 DIAGNOSIS — K5900 Constipation, unspecified: Secondary | ICD-10-CM | POA: Diagnosis not present

## 2020-08-24 DIAGNOSIS — R1031 Right lower quadrant pain: Secondary | ICD-10-CM | POA: Diagnosis not present

## 2020-08-24 IMAGING — CT CT ABDOMEN AND PELVIS WITH CONTRAST
1 of 3 series · 13 of 32 positions shown, 19 images · IV contrast (APPLIED)
Comparison: 05/31/2018

CLINICAL DATA: Mass lump in groin. Recent surgery for mass removal
10/30/2018. Concern mesh may be infected, abscess.

EXAM:
CT ABDOMEN AND PELVIS WITH CONTRAST
TECHNIQUE: Multidetector CT imaging of the abdomen and pelvis was performed
using the standard protocol following bolus administration of
intravenous contrast.
CONTRAST:  100mL [id number] IOPAMIDOL (J33IFU-WGG) INJECTION 61%

[Series 2: abd/pelvis w/cm · axial · 0.78mm/px · z∈[-524,-84]mm · 13 of 102 slices shown, 19 images]
[im 7/102  soft-tissue]
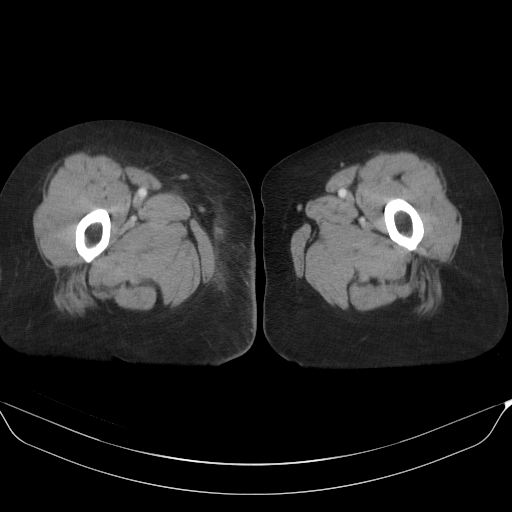
[im 7/102  bone]
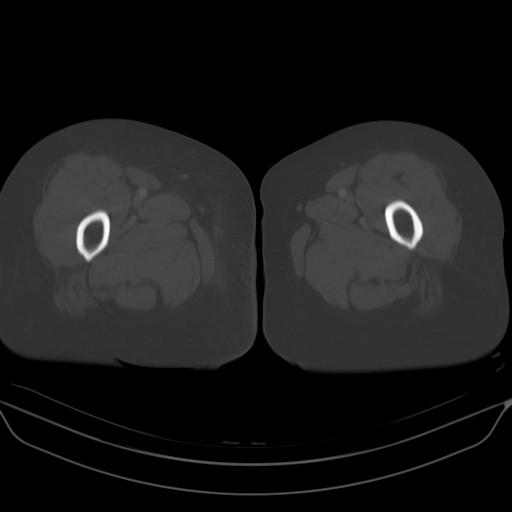
[im 14/102  soft-tissue]
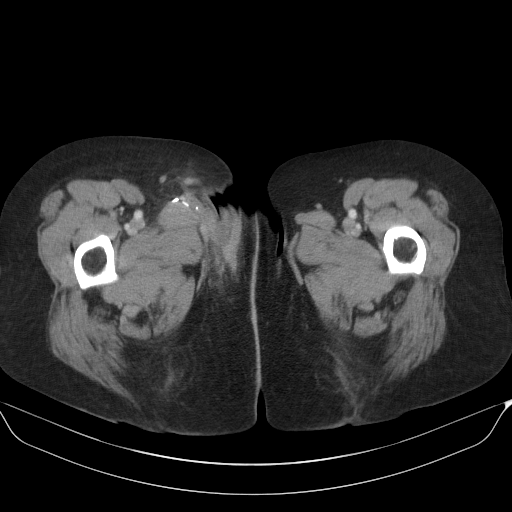
[im 21/102  soft-tissue]
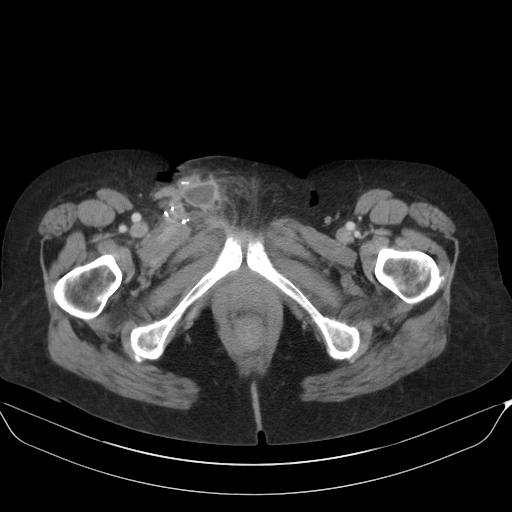
[im 27/102  soft-tissue]
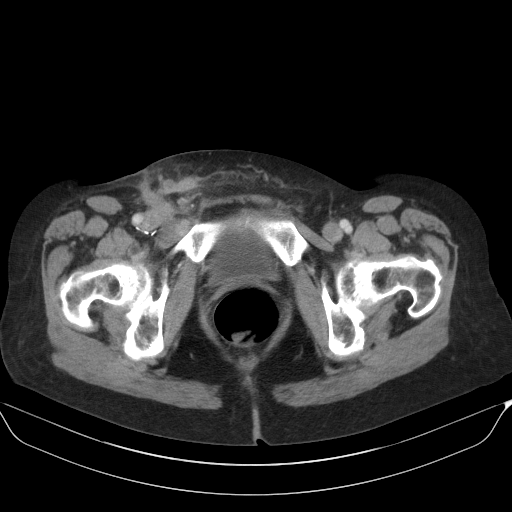
[im 34/102  soft-tissue]
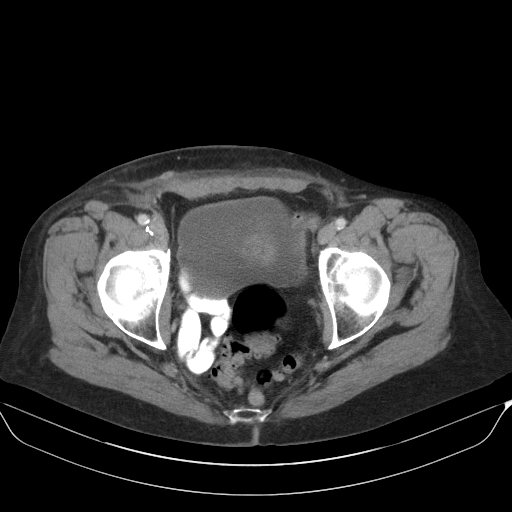
[im 41/102  soft-tissue]
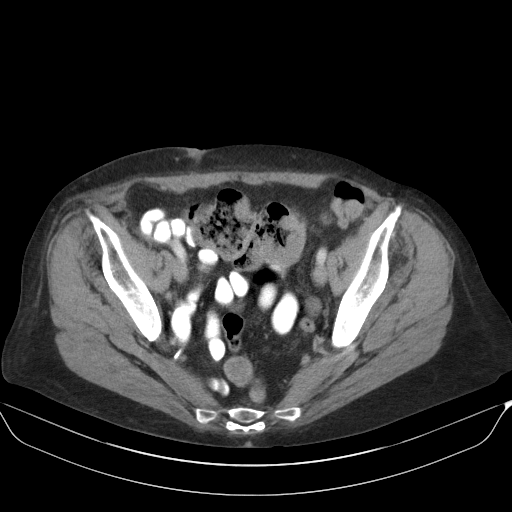
[im 54/102  soft-tissue]
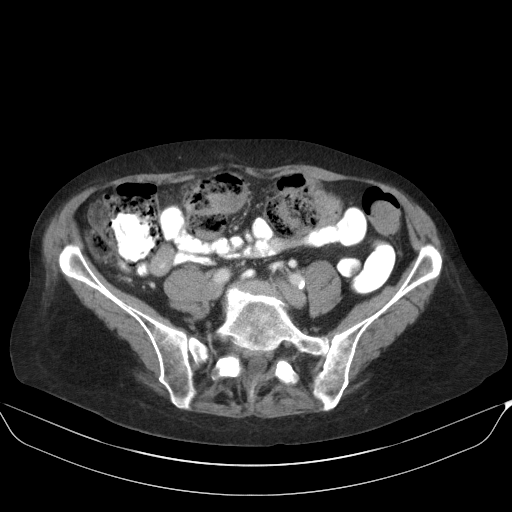
[im 61/102  soft-tissue]
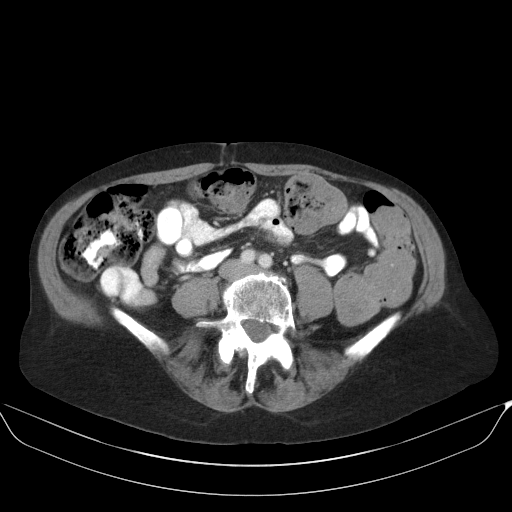
[im 68/102  soft-tissue]
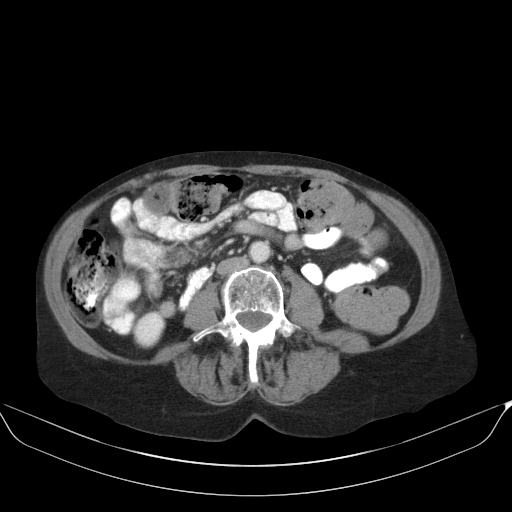
[im 68/102  bone]
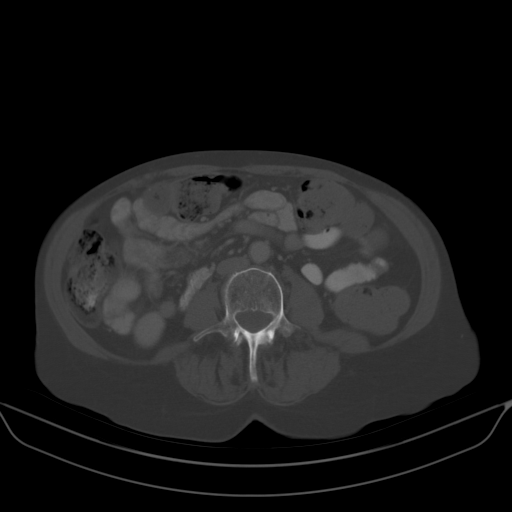
[im 75/102  soft-tissue]
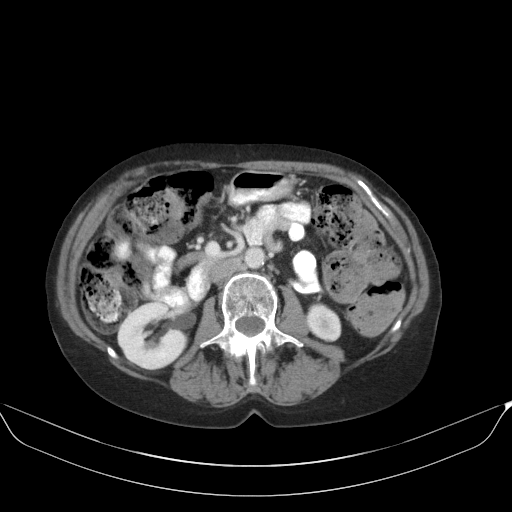
[im 75/102  lung]
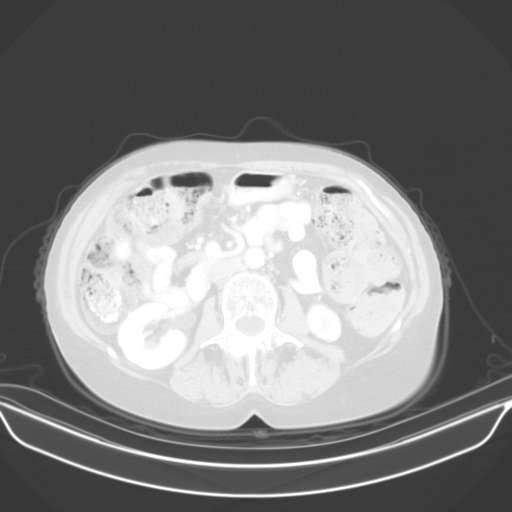
[im 81/102  soft-tissue]
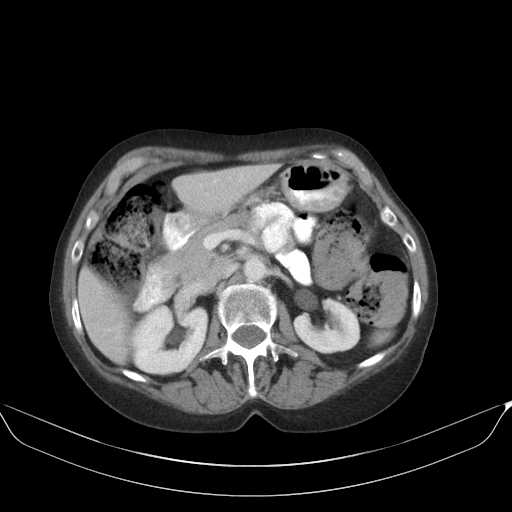
[im 81/102  lung]
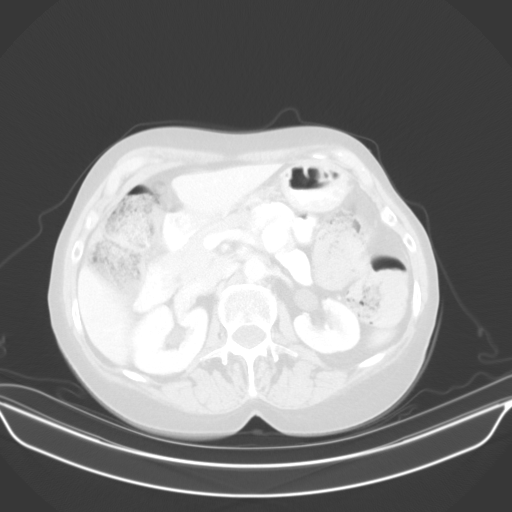
[im 88/102  soft-tissue]
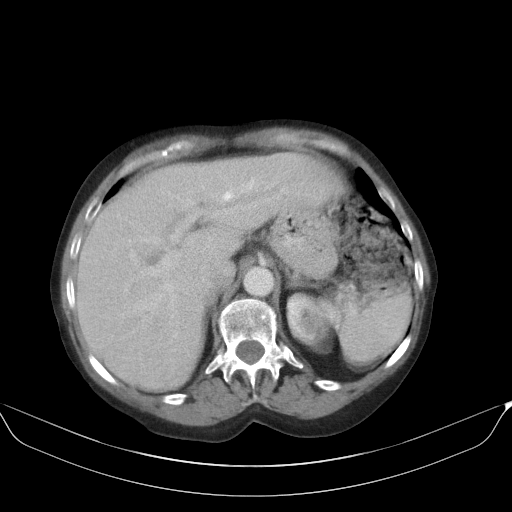
[im 88/102  lung]
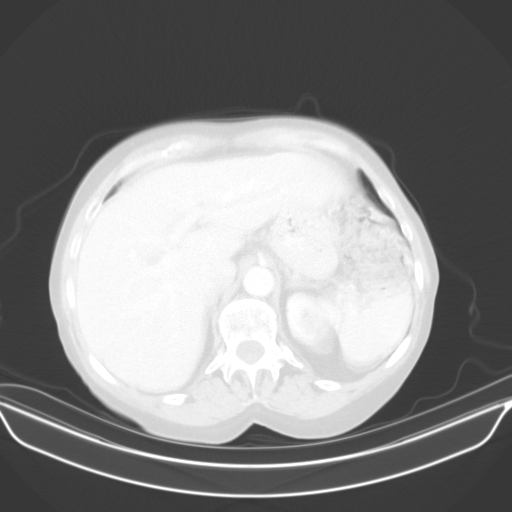
[im 95/102  soft-tissue]
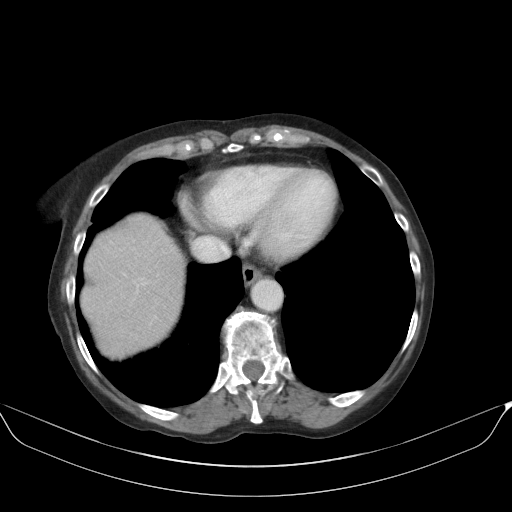
[im 95/102  lung]
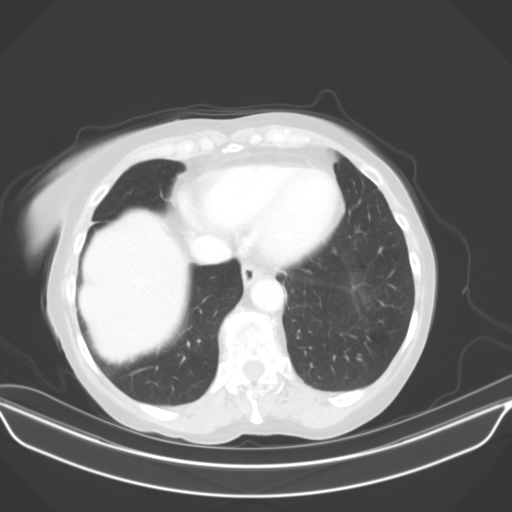

[13 of 32 positions shown; findings below may reference images not displayed]

FINDINGS: Lower chest: No acute abnormality

Hepatobiliary: No focal liver abnormality is seen. Status post
cholecystectomy. No biliary dilatation.

Pancreas: No focal abnormality or ductal dilatation.

Spleen: No focal abnormality.  Normal size.

Adrenals/Urinary Tract: No adrenal abnormality. No focal renal
abnormality. No stones or hydronephrosis. Urinary bladder is
unremarkable.

Stomach/Bowel: Large stool burden in the colon. No evidence of bowel
obstruction. Stomach, small bowel decompressed, unremarkable.

Vascular/Lymphatic: Aortic atherosclerosis. No enlarged abdominal or
pelvic lymph nodes.

Reproductive: Prior hysterectomy.  No adnexal masses.

Other: No free fluid or free air. There is a fluid collection within
the right inguinal region, extending medially into the right
perineal soft tissues. Greatest length measures 6.4 cm. Greatest
thickness measures 1.4 cm. This is concerning for abscess. There are
multiple enhancing areas in the adductor muscles, best seen coronal
image 20 7-29 concerning for small intramuscular abscesses.

Musculoskeletal: No acute bony abnormality.
IMPRESSION: Fluid collection in the right inguinal region, extending medially
into the upper right perineal soft tissues concerning for
postoperative abscess, 6.4 x 1.4 cm. Areas of enhancement also noted
in the adjacent abductor muscles which on the coronal imaging appear
to represent small intramuscular abscesses.

Large stool burden in the colon.

Prior cholecystectomy and hysterectomy.

Aortic atherosclerosis.

## 2020-08-30 ENCOUNTER — Other Ambulatory Visit: Payer: Self-pay | Admitting: Family Medicine

## 2020-08-30 ENCOUNTER — Other Ambulatory Visit (HOSPITAL_COMMUNITY): Payer: Self-pay | Admitting: Family Medicine

## 2020-08-30 DIAGNOSIS — R1031 Right lower quadrant pain: Secondary | ICD-10-CM

## 2020-09-05 ENCOUNTER — Other Ambulatory Visit (HOSPITAL_COMMUNITY): Payer: Self-pay | Admitting: Family Medicine

## 2020-09-05 ENCOUNTER — Encounter: Payer: PPO | Admitting: Surgery

## 2020-09-05 ENCOUNTER — Other Ambulatory Visit: Payer: Self-pay | Admitting: Family Medicine

## 2020-09-05 ENCOUNTER — Ambulatory Visit
Admission: RE | Admit: 2020-09-05 | Discharge: 2020-09-05 | Disposition: A | Discharge status: Home / Self Care | Payer: PPO | Source: Ambulatory Visit | Attending: Surgery | Admitting: Surgery

## 2020-09-05 ENCOUNTER — Other Ambulatory Visit: Payer: Self-pay

## 2020-09-05 ENCOUNTER — Ambulatory Visit
Admission: RE | Admit: 2020-09-05 | Discharge: 2020-09-05 | Disposition: A | Discharge status: Home / Self Care | Payer: PPO | Source: Ambulatory Visit | Attending: Family Medicine | Admitting: Family Medicine

## 2020-09-05 DIAGNOSIS — R1031 Right lower quadrant pain: Secondary | ICD-10-CM

## 2020-09-05 DIAGNOSIS — K5730 Diverticulosis of large intestine without perforation or abscess without bleeding: Secondary | ICD-10-CM | POA: Diagnosis not present

## 2020-09-05 DIAGNOSIS — I712 Thoracic aortic aneurysm, without rupture, unspecified: Secondary | ICD-10-CM

## 2020-09-05 DIAGNOSIS — M47816 Spondylosis without myelopathy or radiculopathy, lumbar region: Secondary | ICD-10-CM | POA: Diagnosis not present

## 2020-09-05 DIAGNOSIS — M4316 Spondylolisthesis, lumbar region: Secondary | ICD-10-CM | POA: Diagnosis not present

## 2020-09-05 DIAGNOSIS — I700 Atherosclerosis of aorta: Secondary | ICD-10-CM | POA: Diagnosis not present

## 2020-09-05 DIAGNOSIS — K449 Diaphragmatic hernia without obstruction or gangrene: Secondary | ICD-10-CM | POA: Diagnosis not present

## 2020-09-05 DIAGNOSIS — J432 Centrilobular emphysema: Secondary | ICD-10-CM | POA: Diagnosis not present

## 2020-09-05 DIAGNOSIS — M47814 Spondylosis without myelopathy or radiculopathy, thoracic region: Secondary | ICD-10-CM | POA: Diagnosis not present

## 2020-09-05 MED ORDER — IOPAMIDOL (ISOVUE-300) INJECTION 61%
100.0000 mL | Freq: Once | INTRAVENOUS | Status: AC | PRN
  Administered 2020-09-05: 100 mL via INTRAVENOUS

## 2020-09-07 ENCOUNTER — Ambulatory Visit (HOSPITAL_COMMUNITY): Payer: PPO

## 2020-09-07 ENCOUNTER — Encounter (HOSPITAL_COMMUNITY): Payer: Self-pay

## 2020-09-07 DIAGNOSIS — K625 Hemorrhage of anus and rectum: Secondary | ICD-10-CM | POA: Diagnosis not present

## 2020-09-07 DIAGNOSIS — K648 Other hemorrhoids: Secondary | ICD-10-CM | POA: Diagnosis not present

## 2020-09-07 DIAGNOSIS — R1031 Right lower quadrant pain: Secondary | ICD-10-CM | POA: Diagnosis not present

## 2020-09-07 DIAGNOSIS — K5900 Constipation, unspecified: Secondary | ICD-10-CM | POA: Diagnosis not present

## 2020-09-12 ENCOUNTER — Other Ambulatory Visit: Payer: Self-pay

## 2020-09-12 ENCOUNTER — Ambulatory Visit: Payer: PPO | Admitting: Surgery

## 2020-09-12 ENCOUNTER — Encounter: Payer: Self-pay | Admitting: Surgery

## 2020-09-12 VITALS — BP 93/57 | HR 106 | Resp 20 | Wt 160.0 lb

## 2020-09-12 DIAGNOSIS — I712 Thoracic aortic aneurysm, without rupture, unspecified: Secondary | ICD-10-CM

## 2020-09-12 NOTE — Progress Notes (Signed)
HPI:  The patient is a 75 year old woman who returns for follow-up of a 4.0 cm fusiform ascending aortic aneurysm first noted on CT scan of the chest in March 2019.  She denies any chest or back pain.  She continues to have a lot of right lower abdominal pain related to her previous groin hernia surgery that was complicated by infection of mesh that had to be removed.  Current Outpatient Medications  Medication Sig Dispense Refill  . acetaminophen (TYLENOL) 500 MG tablet Take 1,000 mg by mouth 2 (two) times a day.     . albuterol (PROVENTIL HFA;VENTOLIN HFA) 108 (90 Base) MCG/ACT inhaler Inhale 2 puffs into the lungs every 4 (four) hours as needed for wheezing or shortness of breath.    . budesonide-formoterol (SYMBICORT) 160-4.5 MCG/ACT inhaler Inhale 2 puffs into the lungs 2 (two) times daily.    Marland Kitchen CALCIUM CARBONATE ANTACID PO Take 1,000 mg by mouth daily.     . carboxymethylcellulose (REFRESH PLUS) 0.5 % SOLN Place 2 drops into both eyes 3 (three) times daily.     . Cholecalciferol (VITAMIN D3) 5000 units CAPS Take 5,000 Units by mouth daily.     . Cyanocobalamin (VITAMIN B-12 PO) Take 2,000 mcg by mouth daily.     Marland Kitchen denosumab (PROLIA) 60 MG/ML SOSY injection Inject 60 mg into the skin every 6 (six) months.    . docusate sodium (COLACE) 100 MG capsule Take 200 mg by mouth daily.     Marland Kitchen esomeprazole (NEXIUM) 20 MG capsule Take 20 mg by mouth daily.    Marland Kitchen gabapentin (NEURONTIN) 100 MG capsule Take 100 mg by mouth 3 (three) times daily.    Marland Kitchen LORazepam (ATIVAN) 1 MG tablet Take 0.5-1 mg by mouth See admin instructions. Take 0.5 mg by mouth in the morning and take 1 mg by mouth at bedtime    . polyethylene glycol (MIRALAX / GLYCOLAX) packet Take 17 g by mouth daily.     . SUMAtriptan (IMITREX) 50 MG tablet Take 50 mg by mouth every 2 (two) hours as needed for migraine. May repeat in 2 hours if headache persists or recurs.    Marland Kitchen tiotropium (SPIRIVA) 18 MCG inhalation capsule Place 18 mcg into  inhaler and inhale daily.     . traMADol (ULTRAM) 50 MG tablet Take 50 mg by mouth every 6 (six) hours as needed for moderate pain.    . Carboxymethylcellulose Sodium (REFRESH LIQUIGEL) 1 % GEL Place 1 drop into both eyes at bedtime.    . fluticasone (FLONASE) 50 MCG/ACT nasal spray Place 1 spray into both nostrils daily.      No current facility-administered medications for this visit.     Physical Exam: BP (!) 93/57 (BP Location: Left Arm, Patient Position: Sitting)   Pulse (!) 106   Resp 20   Wt 160 lb (72.6 kg)   SpO2 98% Comment: RA  BMI 22.96 kg/m  She looks well. Cardiac exam shows a regular rate and rhythm with normal heart sounds. Lung exam is clear.   Diagnostic Tests:  Narrative & Impression  CLINICAL DATA:  Follow-up thoracic aortic aneurysm.  EXAM: CT CHEST WITHOUT CONTRAST  TECHNIQUE: Multidetector CT imaging of the chest was performed following the standard protocol without IV contrast.  COMPARISON:  01/18/2019 chest CT angiogram.  FINDINGS: Cardiovascular: Normal heart size. No significant pericardial effusion/thickening. Atherosclerotic nonaneurysmal thoracic aorta. Aortic root diameter 3.9 cm at sinuses of Valsalva. Maximum ascending thoracic aortic diameter 3.9 cm. Maximum  descending thoracic aortic diameter 3.2 cm. Normal caliber pulmonary arteries.  Mediastinum/Nodes: No discrete thyroid nodules. Unremarkable esophagus. No pathologically enlarged axillary, mediastinal or hilar lymph nodes, noting limited sensitivity for the detection of hilar adenopathy on this noncontrast study.  Lungs/Pleura: No pneumothorax. No pleural effusion. Mild centrilobular emphysema. Solid 3 mm left lower lobe pulmonary nodule along the left major fissure (series 8/image 71), stable, considered benign. No acute consolidative airspace disease, lung masses or new significant pulmonary nodules.  Upper abdomen: Cholecystectomy. Hyperdense 1.4 cm anterior  upper left renal cortical lesion with density 92 HU, stable in size, compatible with a hemorrhagic/proteinaceous Bosniak category 2 renal cyst.  Musculoskeletal: No aggressive appearing focal osseous lesions. Moderate thoracic spondylosis.  IMPRESSION: 1. Atherosclerotic nonaneurysmal thoracic aorta. Maximum ascending thoracic aortic diameter 3.9 cm, top-normal. 2. Aortic Atherosclerosis (ICD10-I70.0) and Emphysema (ICD10-J43.9).   Electronically Signed   By: Ilona Sorrel M.D.   On: 09/05/2020 09:27      Impression:  She has a stable 3.9 cm fusiform ascending aortic aneurysm.  This is well below the surgical threshold of 5.5 cm.  I reviewed the CT scan images with her and answered her questions.  I stressed the importance of continued good blood pressure control and preventing further enlargement and acute aortic dissection.  Plan:  She will return to see me in 2 years with a CT of the chest without contrast.  I spent 15 minutes performing this established patient evaluation and > 50% of this time was spent face to face counseling and coordinating the care of this patient's aortic aneurysm.    Gaye Pollack, MD [facility] [phone]

## 2020-09-21 DIAGNOSIS — F419 Anxiety disorder, unspecified: Secondary | ICD-10-CM | POA: Diagnosis not present

## 2020-09-21 DIAGNOSIS — E2839 Other primary ovarian failure: Secondary | ICD-10-CM | POA: Diagnosis not present

## 2020-09-21 DIAGNOSIS — J449 Chronic obstructive pulmonary disease, unspecified: Secondary | ICD-10-CM | POA: Diagnosis not present

## 2020-09-24 ENCOUNTER — Other Ambulatory Visit: Payer: Self-pay | Admitting: Family Medicine

## 2020-09-24 DIAGNOSIS — E2839 Other primary ovarian failure: Secondary | ICD-10-CM

## 2020-09-27 DIAGNOSIS — M810 Age-related osteoporosis without current pathological fracture: Secondary | ICD-10-CM | POA: Diagnosis not present

## 2020-09-27 DIAGNOSIS — Z6824 Body mass index (BMI) 24.0-24.9, adult: Secondary | ICD-10-CM | POA: Diagnosis not present

## 2020-10-01 DIAGNOSIS — M810 Age-related osteoporosis without current pathological fracture: Secondary | ICD-10-CM | POA: Diagnosis not present

## 2020-10-10 DIAGNOSIS — H60521 Acute chemical otitis externa, right ear: Secondary | ICD-10-CM | POA: Diagnosis not present

## 2020-10-15 DIAGNOSIS — K5901 Slow transit constipation: Secondary | ICD-10-CM | POA: Diagnosis not present

## 2020-10-15 DIAGNOSIS — K648 Other hemorrhoids: Secondary | ICD-10-CM | POA: Diagnosis not present

## 2020-10-15 DIAGNOSIS — R1031 Right lower quadrant pain: Secondary | ICD-10-CM | POA: Diagnosis not present

## 2020-11-01 DIAGNOSIS — Z01812 Encounter for preprocedural laboratory examination: Secondary | ICD-10-CM | POA: Diagnosis not present

## 2020-11-04 DIAGNOSIS — J340 Abscess, furuncle and carbuncle of nose: Secondary | ICD-10-CM | POA: Diagnosis not present

## 2020-11-04 DIAGNOSIS — H9201 Otalgia, right ear: Secondary | ICD-10-CM | POA: Diagnosis not present

## 2020-11-06 DIAGNOSIS — D124 Benign neoplasm of descending colon: Secondary | ICD-10-CM | POA: Diagnosis not present

## 2020-11-06 DIAGNOSIS — K625 Hemorrhage of anus and rectum: Secondary | ICD-10-CM | POA: Diagnosis not present

## 2020-11-06 DIAGNOSIS — R1031 Right lower quadrant pain: Secondary | ICD-10-CM | POA: Diagnosis not present

## 2020-11-06 DIAGNOSIS — K6389 Other specified diseases of intestine: Secondary | ICD-10-CM | POA: Diagnosis not present

## 2020-11-06 DIAGNOSIS — D123 Benign neoplasm of transverse colon: Secondary | ICD-10-CM | POA: Diagnosis not present

## 2020-11-06 DIAGNOSIS — K5730 Diverticulosis of large intestine without perforation or abscess without bleeding: Secondary | ICD-10-CM | POA: Diagnosis not present

## 2020-11-09 DIAGNOSIS — D124 Benign neoplasm of descending colon: Secondary | ICD-10-CM | POA: Diagnosis not present

## 2020-11-09 DIAGNOSIS — D123 Benign neoplasm of transverse colon: Secondary | ICD-10-CM | POA: Diagnosis not present

## 2020-11-12 ENCOUNTER — Ambulatory Visit: Payer: Self-pay | Admitting: Surgery

## 2020-11-12 DIAGNOSIS — K581 Irritable bowel syndrome with constipation: Secondary | ICD-10-CM | POA: Diagnosis not present

## 2020-11-12 DIAGNOSIS — G8929 Other chronic pain: Secondary | ICD-10-CM | POA: Diagnosis not present

## 2020-11-12 DIAGNOSIS — F32A Depression, unspecified: Secondary | ICD-10-CM | POA: Diagnosis not present

## 2020-11-12 DIAGNOSIS — R1031 Right lower quadrant pain: Secondary | ICD-10-CM | POA: Diagnosis not present

## 2020-11-12 DIAGNOSIS — F419 Anxiety disorder, unspecified: Secondary | ICD-10-CM | POA: Diagnosis not present

## 2020-11-12 NOTE — H&P (Signed)
Catherine [name] Appointment: 11/12/2020 1:45 PM Location: [facility] Patient #: [id number] DOB: [birth year] Married / Language: [name] / Race: White Female  History of Present Illness Catherine Hector MD; 11/12/2020 2:24 PM) The patient is a 75 year old female presenting for a post-operative visit. Note for "Post-Operative": ` ` ` The patient returns  s/p excision of cyst in right groin 01/2016. Pathology c/w lymphagioma & benign LN s/p excision of inner pelvic mass, bilateral femoral hernia repair, left inguinal hernia repair 01/2018. Pathology c/w synovial cyst s/p proximal thigh mass 06/2018. Pathology c/w syniovial cyst & benign LN Incision and drainage of small seroma right lower groin incision 09/20/2018 Incision and drainage of right groin and thigh abscesses with debridement 12/09/2018 Laparoscopic preperitoneal dissection and explantation of right sided preperitoneal ultra pro polypropylene mesh. Reexcision of groin wounds. 02/28/2019    Patient sent over concerns of new right lower quadrant abdominal pain the past 3 months.  Patient returns. I haven't seen her in 14 months. She's had a complicated history with right groin synovial cysts and chronic atypical bacterial infections and hernias. She has remained abstinent from smoking. She tries to work on [employer]. She's had some inner thigh weakness she claims oh although working with physical therapy and exercise has recovered. She notes new onset right lower quadrant abdominal pain (although she mentioned some pain 14 months ago). Worse when she is sitting. Worse with activity. Struggle with worsening constipation. She felt like the MiraLAX didn't work anymore. She has been doing Benefiber and Colace and prune juice. Discuss with Dr. Ernest Haber her gastrologist. He did a colonoscopy last week that I do not have a report of. She tells me was normal. At scan did not show any recurrent hernia or groin sister  lymphadenopathy. Nonetheless she was sent back to me to see if I had anything else to offer.  Patient still notes she has chronic groin pain been she "can live with that". Some burning and discomfort. She felt like when we had done nerve blocks in that region and made her inner thigh numb and weak for months. I now she had some abductor weakness after the last surgery but seems to be walking normally now. She does still frustrated by the pain. She has depression and anxiety. She believes she is been offered Lexapro "no one wanted to monitor with me & just kept throwing pills at me".  PRIOR NOTES: 2021: Patient comes today by herself. She completed all antibiotics and has been antibiotic free for the past month. No purulence. She still feels some right lower quadrant/groin/thigh discomfort. Seems more prominent when she is constipated. Takes Aleve in the morning and Tylenol at night. Takes gabapentin twice a day. Working with outpatient physical therapy. Trying to do exercises. However she just feels like her leg is still a Little but we cannot normal. She is concerned her right knee is bothering her. She is concerned that she has some swelling on her right side. Minimal numbness or tingling in her proximal right inner thigh. Improved overall. Denies any new abdominal swelling. She is disappointed that she is not better and worries something else is going on. She had an MRI done in late December that showed no evidence of any cellulitis, seroma, abscess. Mild adductor muscle atrophy but no inflammation anymore as had been seen on prior films  06/2019: Patient returns. Seen by infectious disease. Concern for some swelling. MRI of abdomen and pelvis done. No residual fluid collection. Mild inflammation  of adductor thigh. Subtle to my review. Overall improved. She comes in frustrated. She notes that she is having pain and swelling again. His convinced she has a hernia on her  right lower abdomen. She's been having low back pain again. Taking Aleve for that. However she doesn't like to take in more than once a day for stomach upset. She's been trying to take Tylenol at night. She cannot tolerate more than 100 mg a gabapentin or she feels quite sleepy and groggy. Does not like to take narcotics either. She notes she feels some pain and discomfort when she sitting for a while. She's had some sciatic-like pain from her lower back as well. Frustrated. She is tired of being on antibiotics and hoping to get off them in the next 2 weeks. No pain or discomfort on the left side. Because of the Covid pandemic, she's been staying at home. Can walk around the house okay but not doing much beyond errands.  Summer 2020:  Patient returns after surgery to explant the right-sided TEP mesh & re-excise the wound. She developed seroma in the preperitoneal cavity and inspecting sintering. Drainage output went down improved. She had follow-up where they replaced the drain. Didn't get much out of removed later. No more drains as of last week. She continues on her antibiotic regimen being followed by infectious disease. Worried because of lumpiness is not totally gone away. She does feel some groin discomfort when she was going back to trying her stretching exercises and walking. Gabapentin helps but is very sedating. Doing 100 mg the day and 200 at bedtime. Psych she is constipated needing Senokot, Colace, MiraLAX. As her bowels once a day with that regimen.  PROPIONIBACTERIUM ACNES in mesh AFB & Fungal negative Fluid in seroma cavity negative for any culture  06/14/2019 CLINICAL DATA: Evaluate for abscess. Abdominal pain and fever.  EXAM: MRI ABDOMEN AND PELVIS WITHOUT AND WITH CONTRAST  TECHNIQUE: Multiplanar multisequence MR imaging of the abdomen and pelvis was performed both before and after the administration of intravenous contrast.  CONTRAST: 42mL GADAVIST  GADOBUTROL 1 MMOL/ML IV SOLN  COMPARISON: CT AP 04/09/2019  FINDINGS: COMBINED FINDINGS FOR BOTH MR ABDOMEN AND PELVIS  Lower chest: No acute findings.  Hepatobiliary: No mass or other parenchymal abnormality identified. Previous cholecystectomy. No significant biliary ductal dilatation.  Pancreas: No mass, inflammatory changes, or other parenchymal abnormality identified.  Spleen: Within normal limits in size and appearance.  Adrenals/Urinary Tract: Normal appearance of the adrenal glands. No kidney mass or hydronephrosis identified. Urinary bladder is unremarkable  Stomach/Bowel: Visualized portions within the abdomen are unremarkable.  Vascular/Lymphatic: No pathologically enlarged lymph nodes identified. No abdominal aortic aneurysm demonstrated.  Reproductive: Status post hysterectomy. No adnexal masses.  Other: Postoperative changes within the right inguinal region are again identified compatible with previous herniorrhaphy. The large fluid collection within the right lower quadrant of the abdomen has resolved in the interval status post percutaneous drainage. There is continued increased edema within the soft tissues surrounding the right inguinal canal.  Musculoskeletal: The visualized osseous structures are unremarkable.Asymmetric increased T2 signal and heterogeneous enhancement within right abductor musculature, image 38/5 and image 45/20. no discrete, drainable fluid collection identified at this time.  IMPRESSION: 1. Interval resolution large fluid collection within the right lower quadrant of the abdomen status post percutaneous drainage. 2. No discrete, drainable fluid collection identified at this time. 3. Increased T2 signal and enhancement within the right abductor musculature. This is nonspecific and may be inflammatory or infectious  in etiology. Cannot rule out myositis. No discrete, drainable fluid collection identified at this  time.   Electronically Signed By: Kerby Moors M.D. On: 06/14/2019 12:44   02/28/2019  9:50 PM  PATIENT: Catherine [name] 75 y.o. female  Patient Care Team: Jonathon Jordan, MD as PCP - General (Family Medicine) Felecia Shelling, Nanine Means, MD as Consulting Physician (Neurology) Michael Boston, MD as Consulting Physician (General Surgery) Ronald Lobo, MD as Consulting Physician (Gastroenterology) Tommy Medal, Lavell Islam, MD as Consulting Physician (Infectious Diseases) Jackelyn Knife, MD as Rounding Team (Internal Medicine) Lorenda Cahill [place] Constable, MD as Consulting Physician (Infectious Diseases)  PRE-OPERATIVE DIAGNOSIS: RECURRENT RIGHT GROIN MYCOBACTERIAL INFECTION, HISTORY OF INGUINAL HERNIA REPAIR WITH MESH  POST-OPERATIVE DIAGNOSIS:  RECURRENT RIGHT GROIN MYCOBACTERIAL INFECTION HISTORY OF INGUINAL HERNIA REPAIR WITH MESH  PROCEDURE:  LAPAROSCOPIC EXPLORATION WITH REMOVAL OF RIGHT PREPERITONEAL MESH RIGHT GROIN EXPLORATION WITH RE-EXCISION OF GROIN ABSCESSES PRIMARY RIGHT INGUINAL HERNIA REPAIR LAPAROSCOPIC LYSIS OF ADHESIONS EXCISION RLQ ABDOMINAL WALL SCAR  SURGEON: Catherine Hector, MD  ASSISTANT: None  ANESTHESIA:   Regional ilioinguinal and genitofemoral and spermatic cord nerve blocks General  EBL: Total I/O In: 1000 [I.V.:1000] Out: 600 [Urine:350; Blood:250]. See anesthesia record  Delay start of Pharmacological VTE agent (>24hrs) due to surgical blood loss or risk of bleeding: no  DRAINS: NONE  SPECIMEN: Right preperitoneal ultra pro mesh. Right lower quadrant dimpled scar, possible sinus tract Right groin and proximal thigh old scar with chronic abscesses  DISPOSITION OF SPECIMEN: Mesh and right groin abscesses sent for pathology as well as aerobic/anaerobic, fungal, TB/mycobacterial culture  COUNTS: YES  PLAN OF CARE: Discharge to home after PACU  PATIENT DISPOSITION: PACU - hemodynamically stable.  INDICATION: Patient with  complicated medical history with recurrent right groin lesions with prior excisions including lymphangioma, synovial cyst, chronic groin infections. Mycobacterial. Prior excisions with recurrence. Infectious disease consultation. Second opinion [employer]. Recommendation removed preperitoneal right groin mesh given the chronic draining wounds. Technique was discussed at length with the patient. Risks of nerve injury numbness decreased function hernia recurrence wound abscess recurrence etc. were discussed.  The anatomy & physiology of the abdominal wall and pelvic floor was discussed. The pathophysiology of hernias in the inguinal and pelvic region was discussed. Natural history risks such as progressive enlargement, pain, incarceration & strangulation was discussed. Contributors to complications such as smoking, obesity, diabetes, prior surgery, etc were discussed.   Risks such as bleeding, infection, abscess, need for further treatment, heart attack, death, and other risks were discussed. I noted a good likelihood this will help address the problem. Goals of post-operative recovery were discussed as well. Possibility that this will not correct all symptoms was explained. I stressed the importance of low-impact activity, aggressive pain control, avoiding constipation, & not pushing through pain to minimize risk of post-operative chronic pain or injury. Possibility of reherniation was discussed. We will work to minimize complications.   Questions were answered. The patient expresses understanding & wishes to proceed with surgery.  OR FINDINGS: Very well incorporated preperitoneal mesh with no evidence of any purulence or infection in the preperitoneal space nor along the femoral or internal inguinal canals. Not consistent with chronic mesh infection.  Sinus dimpled scar in right lower quadrant with fibrotic tract going down to fascia. No stitch abscess or hernia recurrence. No  purulence or casing granulomas. Wound closed.  Right groin oblique scars with yellow caseating wounds/abscesses. Widely excised down to inguinal floor with probable inguinal hernia at the base after debridement. Inguinal floor  primarily closed. Distal aspect of proximal inner thigh wound well-healed without caseating granulomas.  DESCRIPTION:  The patient was identified & brought into the operating room. The patient was positioned supine with arms tucked. SCDs were active during the entire case. The patient underwent general anesthesia without any difficulty. The abdomen was prepped and draped in a sterile fashion. The patient's bladder was emptied. A Surgical Timeout confirmed our plan.  I made a transverse incision through the inferior umbilical fold. I made a small transverse nick through the anterior rectus fascia contralateral to the inguinal hernia side and placed a 0-vicryl stitch through the fascia. I placed a Hasson trocar into the preperitoneal plane. Entry was clean. We induced carbon dioxide insufflation. Camera inspection revealed no injury. I used a 68mm angled scope to bluntly free the peritoneum off the infraumbilical anterior abdominal wall. I created enough of a preperitoneal pocket to place 81mm ports into the right & left mid-abdomen into this preperitoneal cavity.  I focused attention on the RIGHT pelvis since that was the dominant hernia side. I used blunt & focused sharp dissection to free the peritoneum off the flank and down to the pubic rim. Encountered dense inflammation consistent with the mesh. Initially freed the Ultrapro mesh off the anterior abdominal wall and the oblique muscles on the right lateral aspect. Came down to the pubic rim which was densely adherent and freed it off the inner pelvis. I then began to lift the mesh off the peritoneum through focused blunt as well as cautery scissors dissection. I was able to freed off the right anterior flank and  psoas and come more medially. Is able to freed off the anterior pelvis and bladder and come laterally. The most dense adhesions were to the iliac artery specially. Eventually freed that off carefully using focus cautery scissors and sharp dissection. Eventually was able to free off and remove the mesh pretty intact. Did have to go into the peritoneum and a few spots to take peritoneum to get the mesh out. On reinspection there was a patch of mesh still on the iliac artery more proximally. Carefully freed this patch off until the iliac artery was well exposed with no mesh adhered to it. Fortunately the adhesions to the iliac vein were soft with a fat layer, so I did not have to do any sharp dissection to free the mesh off it. There was no giant inguinal or femoral hernia from the inside. Mesh was removed out the umbilical port.  I did copious irrigation. I assured hemostasis. I had the circulator fill up the bladder with isotonic solution stained with methylene blue up to 400 mL. Got good bladder distention without any evidence of any bladder leak or other abnormality the preperitoneal or intraperitoneal space. I primarily closed the peritoneal defects in the right lower quadrant using 2-0 Vicryl intracorporeal laparoscopic suturing to good result. Did copious irrigation few more liters and had good hemostasis. I evacuated the carbon dioxide. Ports were removed. Infraumbilical fascia was primary closed using 0 Vicryl interrupted suture. I closed the port [name] with 4-0 Monocryl suture at the 5 mm port [name]. Sterile gauze and dressing applied.  Next focused on the right lower quadrant groin. There was a dimple diverting scar in the right lower quadrant region. I made a transverse prior concave incision around it and came around sharply to find a scar/sinus tract going down to the fascia. There was no stitch abscess or retained foreign body suture. Sent that off for culture. I  encountered  no abscess nor fat necrosis. I primarily closed that wound with interrupted Monocryl suture and sterile dressing.  Next I focused on the right groin. The right groin scar was mostly closed down but there are a few punctate openings with yellow caseating thinly purulent cheesy material. Also in the right inguinal/thigh crease. The right proximal thigh scar was well-healed proximally. I made a large bio concave oblique incision to excise most of the old scars and excised any subcutaneous nodularity and old scars down to the inguinal floor and scar tissue. There was some laxity in the inguinal floor after sharp dissection and cautery. I primarily closed it with 0 Vicryl in a running fashion to good result. I dissected until there was nice soft normal subcutaneous tissues. Resulting 10 x 5 cm wound had good granulation. It was carefully packed.  The patient was extubated & arrived in the PACU in stable condition.. I had discussed postoperative care with the patient in the holding area. Instructions are written in the chart. I discussed operative findings, updated the patient's status, discussed probable steps to recovery, and gave postoperative recommendations to the patient's spouse. Recommendations were made. Questions were answered. He expressed understanding & appreciation.   Catherine [name], M.D., F.A.C.S. Gastrointestinal and Minimally Invasive Surgery [facility] [address], [place], [place] [address] [phone] Main / Paging  02/28/2019 9:50 PM  12/09/2018  12:04 PM  PATIENT: Catherine [name] 75 y.o. female  Patient Care Team: Jonathon Jordan, MD as PCP - General (Family Medicine) Felecia Shelling, Nanine Means, MD as Consulting Physician (Neurology) Michael Boston, MD as Consulting Physician (General Surgery) Ronald Lobo, MD as Consulting Physician (Gastroenterology) Tommy Medal, Lavell Islam, MD as Consulting Physician (Infectious  Diseases) Jackelyn Knife, MD as Rounding Team (Internal Medicine)  PRE-OPERATIVE DIAGNOSIS: RIGHT GROIN & THIGH ABSCESSES  POST-OPERATIVE DIAGNOSIS: RIGHT GROIN & THIGH ABSCESSES   PROCEDURE: INCISION AND DRAINAGE RIGHT GROIN AND THIGH ABSCESSES DEBRIDEMENT  SURGEON: Catherine Hector, MD  ASSISTANT: OR Staff  ANESTHESIA: local and general  EBL: Total I/O In: 659 [I.V.:913] Out: 20 [Blood:20]  Delay start of Pharmacological VTE agent (>24hrs) due to surgical blood loss or risk of bleeding: no  DRAINS: none  SPECIMEN: Right groin and thigh skin, dermis, subcutaneous tissue, fascia for culture and pathology  DISPOSITION OF SPECIMEN: MICROBIOLOGY & PATHOLOGY  COUNTS: YES  PLAN OF CARE: Admit to inpatient  PATIENT DISPOSITION: PACU - hemodynamically stable.  INDICATION: Former smoking female with right groin masses requiring numerous operations. S/p excision of cyst in right groin 01/2016. Pathology c/w lymphagioma & benign LN s/p lap TEP excision of inner pelvic mass, bilateral femoral hernia repair, left inguinal hernia repair 01/2018. Pathology c/w synovial cyst s/p excision proximal thigh mass 06/2018. Pathology c/w syniovial cyst & benign LN Incision and drainage of small seroma right lower groin incision 09/20/2018 Right groin and inner thigh exploration with excision of subcutaneous and lymph node 11/02/2018  Development of chronic wound infection. Growing atypical Mycobacterium species. Followed by infectious disease. Progression of inflammation despite antibiotic regimen. Admitted for worsening cellulitis and pain. Recommendation made for operative incision and drainage of abscesses and debridement of chronically inflamed infected tissue The anatomy and physiology of skin abscesses was discussed. Pathophysiology of SQ abscess, possible progression to fasciitis & sepsis, etc discussed . I stressed good hygiene & wound care. Possible redebridement was  discussed as well.  Possibility of recurrence was discussed. Risks, benefits, alternatives were discussed. I noted a good  likelihood this will help address the problem. Risks of anesthesia and other risks discussed. Questions answered. The patient is does wish to proceed.  OR FINDINGS: Swollen indurated right groin and inner thigh incisions with cellulitis and scant purulent drainage. Some tissue necrosis/infection.    Incision done in right groin and right thigh through skin, dermis, subcutaneous tissues, down to level of fascia. Excision with surgical scalpel and cautery.  Right groin wound 5 x 2 cm. Goes 2 cm deep.  Right thigh wound 7 x 3 cm with undermining. Goes 3 cm deep   DESCRIPTION:  Informed consent was confirmed. The patient remained on oral linezolid & clofazimine per infectious disease. The patient underwent general anesthesia without any difficulty. The patient was positioned supine. SCDs were active during the entire case. The area around the abscess was prepped and draped in a sterile fashion. A surgical timeout confirmed our plan.  I made an incision over the most fluctuant area of the right groin and right thigh inflamed former incisions. Purulent material expressed. Sent for aerobic and anaerobic culture. I placed my finger into the abscess cavity to break up loculations. The incision was extended to adequately expose the entire cavity. I extended the wound to expose the chronically inflamed necrotic and infected tissue. I used scalpel and scissors to excise necrotic and infected tissue down to the level of the anterior abdominal wall fascia on the right groin and the anterior inner fascia of the right thigh. We did irrigation. The fascia was viable. Excess skin excised to have nice open wounds. Right thigh wound undermined it cephalad for the central area of swelling not associated with incision. We took extra care to ensure hemostasis. Tissue was sent for  culture and pathology. We called and discussed with microbiology to make sure that appropriate specimens were obtained and handled  The wound was packed with saline moistened gauze. Sterile dressings applied. Patient is being extubated go to recovery room. Plan is to continue antibiotic regimen. I did do a field block of local anesthetic. We will begin dressing care tomorrow. Hopefully go home tomorrow. Per the patient's request, I did not call her spouse or any other family member.  Catherine [name], M.D., F.A.C.S. Gastrointestinal and Minimally Invasive Surgery [facility] [address], [place], [place] [address] [phone] Main / Paging     Pathology: Diagnosis Synovium, cyst, right groin - BENIGN CYST, COMPATIBLE WITH CLINICALLY STATED SYNOVIAL CYST. - BENIGN LYMPH NODE. Jaquita Folds MD Pathologist, Electronic Signature (Case signed 06/18/2018) Specimen Rudy Luhmann and Clinical Information Specimen(s) Obtained: Synovium, cyst, right groin Specimen Clinical Information right groin subcutaneous mass, 3 cm x 2 cm (kp) Marcell Pfeifer Received in formalin are multiple irregular portions of fibroadipose tissue measuring 4.5 x 4.5 x 1.5 cm in aggregate. There is clear mucinous fluid present on the surface of the tissue. An intact cyst is not grossly identified. The cut surface shows an ill-defined firm tan-white nodular area measuring up to 1.8 cm. Sections are submitted in three cassettes. (GP:ah 06/17/18) Report signed out from the following location(s) Technical Component was performed at [facility] [place], [place], [place] [address]. CLIA #: [id number], Interpretation was performed at [facility] [place], [place], [place] [address]. CLIA #: [id number],  Marland Kitchen 06/16/2018  5:37 PM  PATIENT: Catherine [name] 74 y.o. female  Patient Care Team: Jonathon Jordan, MD as PCP - General (Family  Medicine) Felecia Shelling, Nanine Means, MD as Consulting  Physician (Neurology) Michael Boston, MD as Consulting Physician (General Surgery) Ronald Lobo, MD as Consulting Physician (Gastroenterology)  PRE-OPERATIVE DIAGNOSIS: Right groin subcutaneous mass, 3 cm x 2 cm  POST-OPERATIVE DIAGNOSIS: Right groin subcutaneous masses, largest 4 cm x 3 x 3 cm  PROCEDURE: REMOVAL OF RIGHT GROIN / THIGH MASSES  SURGEON: Catherine Hector, MD  ASSISTANT: RN  ANESTHESIA: Local anesthetic and field block in the subcutaneous tissues. No deep local anesthetic General  I&O Total I/O In: 965 [I.V.:915; IV Piggyback:50] Out: 10 [Blood:10]  Delay start of Pharmacological VTE agent (>24hrs) due to surgical blood loss or risk of bleeding: NO  DRAINS: none  SPECIMEN: No Specimen  DISPOSITION OF SPECIMEN: N/A  COUNTS: YES  PLAN OF CARE: Discharge to home after PACU  PATIENT DISPOSITION: PACU - hemodynamically stable.  INDICATION: Pleasant woman was right inguinal mass excised consistent with lymphocele. Recurrent pain and swelling and found to have a femoral hernia and femoral canal cystic mass. Excised. Consistent with a synovial cyst. Femoral hernia repaired. Initially did well. Then felt new swelling in her inner thigh away from her prior areas. CT scan showed fluid collection. Aspirated. Thick fluid concerning for recurrent synovial cyst. Mass recurred. Patient requested for groin exploration with possible excision.  The pathophysiology of skin & subcutaneous masses was discussed. Natural history risks without surgery were discussed. I recommended surgery to remove the mass. I explained the technique of removal with use of local anesthesia & possible need for more aggressive sedation/anesthesia for patient comfort.   Risks such as bleeding, infection, wound breakdown, heart attack, death, and other risks were discussed. I noted a good likelihood this will help address the problem.  Possibility that this will not correct all symptoms was explained. Possibility of regrowth/recurrence of the mass was discussed. We will work to minimize complications. Questions were answered. The patient expresses understanding & wishes to proceed with surgery.  OR FINDINGS: Simple cystic mass 4 x 3 x 3 cm on proximal inner right thigh consistent with recurrent synovial cyst. Smaller firm nodules in a cluster heading towards the femoral foramen concerning for a small synovial cyst as well. Excised.  Blake drain left in place  DESCRIPTION:  The patient was identified & brought into the operating room. The patient was positioned supine with arms tucked. SCDs were active during the entire case. The patient underwent general anesthesia without any difficulty. The abdomen was prepped and draped in a sterile fashion. A Surgical Timeout confirmed our plan.  Made an oblique incision in the medial right groin just above the crease between the thigh region. Came to the subcutaneous tissues and the deep subcutaneous tissues going towards the right inner thigh was a cystic mass. Able to come around it. Was decompressed and had thick tan syrupy brown fluid within it. Concerning for recurrent synovial cyst. Skeletonized the more distal aspects on the proximal thigh and placed medium-size clips on it. Elevate the mass off the inner thigh muscular compartment and excised the larger mass. I could feel some small PE and small marble sized cystic masses with fibrosis heading towards the femoral ring underneath the inguinal ligament. Skeletonized this region carefully excised it. Placed clips just at the femoral foramen. Took care to avoid any vascular or nerve injury. Hemostasis was good.  I did copious irrigation over a liter and reinspection. Remaining tissues were soft with no evidence of any persistent cystic masses or other areas of concern. Most compartments intact. Hemostasis good. No  evidence of any neurovascular  injury. Resulting 10 x 5 x 4 cm wound. Side to place a 15 [place] Blake drain from the right lower quadrant through the subcutaneous tissues into this inguinal/proximal thigh pocket. The skin with 2-0 Prolene suture. Closed with 3-0 Vicryl interrupted sutures at Scarpa's and 4 Monocryl suture at the deep dermal subcuticular running fashion. Dermabond placed.  Patient extubated in recovery room. Talkative. Has some soreness but moving extremities normally. I discussed operative findings, updated the patient's status, discussed probable steps to recovery, and gave postoperative recommendations to the patient's spouse. Recommendations were made. Questions were answered. He expressed understanding & appreciation. I had discussed postoperative care with the patient in the holding area.   Catherine [name], M.D., F.A.C.S. Gastrointestinal and Minimally Invasive Surgery [facility] 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) VISIT FOR WOUND CHECK (Z51.89) CHANGE OR REMOVAL OF DRAINS (Z48.03) GROIN PAIN, CHRONIC, RIGHT (R10.31) RIGHT INGUINAL HERNIA (K40.90) PREOP - ING HERNIA - ENCOUNTER FOR PREOPERATIVE EXAMINATION FOR GENERAL SURGICAL PROCEDURE (Z01.818) OTHER GANGLION AND CYST OF SYNOVIUM, TENDON, AND BURSA (M67.49, M67.89,M71.39) BILATERAL FEMORAL HERNIA WITHOUT OBSTRUCTION OR GANGRENE, RECURRENCE NOT SPECIFIED (K41.20) MASS OF THIGH, RIGHT (R22.41) ACID-FAST BACTERIA PRESENT (R89.9) MYCOBACTERIUM FORTUITUM INFECTION (A31.8) PROPIONIBACTERIUM INFECTION (A49.8) ABDOMINAL WALL MASS OF RIGHT  LOWER QUADRANT (R19.03) RIGHT KNEE PAIN, UNSPECIFIED CHRONICITY (M25.561)  Allergies Janeann Forehand, CNA; 11/12/2020 1:38 PM) Penicillin G Benzathine & Proc *PENICILLINS* Rash Sulfabenzamide *CHEMICALS* rash Cipro *FLUOROQUINOLONES* Allergies Reconciled  Medication History Janeann Forehand, CNA; 11/12/2020 1:39 PM) Judithann Sauger Aerosphere (160-9-4.8MCG/ACT Aerosol, Inhalation) Active. Gabapentin (100MG  Capsule, 1 (one) Oral three times daily, as needed, Taken starting 07/04/2019) Active. (Start 100mg  twice a day - can increase gradually to 300mg  3x/day) SUMAtriptan Succinate (50MG  Tablet, Oral) Active. traMADol HCl (50MG  Tablet, Oral) Active. Prolia (60MG /ML Soln Pref Syr, Subcutaneous) Active. Calcium Carbonate (Oral) Specific strength unknown - Active. Vitamin B-12 (5000MCG Tablet Disint, Oral) Active. Cetirizine HCl (5MG  Tablet, Oral) Active. Flonase (50MCG/ACT Suspension, Nasal) Active. Ibuprofen (Oral) Specific strength unknown - Active. LORazepam (1MG  Tablet, Oral) Active. Symbicort (160-4.5MCG/ACT Aerosol, Inhalation) Active. Albuterol Sulfate ((2.5 MG/3ML)0.083% Nebulized Soln, Inhalation) Active. Sucralfate (1GM Tablet, Oral) Active. NexIUM (40MG  Capsule DR, Oral) Active. Vitamin D (Cholecalciferol) (1000UNIT Capsule, Oral) Active. Medications Reconciled    Vitals (Donyelle Alston CNA; 11/12/2020 1:40 PM) 11/12/2020 1:39 PM Weight: 164.13 lb Height: 70in Body Surface Area: 1.92 m Body Mass Index: 23.55 kg/m  Temp.: 97.31F  Pulse: 96 (Regular)  P.OX: 98% (Room air) BP: 140/96(Sitting, Left Arm, Standard)        Physical Exam Catherine Hector MD; 11/12/2020 2:16 PM)  General Mental Status-Alert. General Appearance-Not in acute distress. Voice-Normal. Note: Walking normally without any difficulty getting up and down the bed. Normal gait.  Integumentary Global Assessment Upon inspection and palpation of skin surfaces of the  - Distribution of scalp and body hair is normal. General Characteristics Overall examination of the patient's skin reveals - no rashes and no suspicious lesions.  Head and Neck Head-normocephalic, atraumatic with no lesions or palpable masses. Face Global Assessment - atraumatic, no absence of expression. Neck Global Assessment - no abnormal movements, no decreased range of motion. Trachea-midline. Thyroid Gland Characteristics - non-tender.  Eye Eyeball - Left-Extraocular movements intact, No Nystagmus - Left. Eyeball -  Right-Extraocular movements intact, No Nystagmus - Right. Upper Eyelid - Left-No Cyanotic - Left. Upper Eyelid - Right-No Cyanotic - Right. Note: Wears glasses. Vision corrected  Chest and Lung Exam Inspection Accessory muscles - No use of accessory muscles in breathing.  Abdomen Note: Thin abdominal wall with right lower quadrant asymmetric bulging. I think I feel a spigelian type hernia between her old right subcostal open cholecystectomy incision and near one of her RLQ old port [name]. It is sensitive there. A little more prominent when she stands. Suspicious for incisional or spigelian type hernia.  Female Genitourinary Note: Right suprapubic groin and thigh incisions all closed. Minimal fullness in inguinal and right femoral/inner thigh region. No wounds or drainage or cellulitis. Only mildly sensitive to touch. No obvious recurrent inguinal or femoral hernia  Peripheral Vascular Upper Extremity Inspection - Left - Not Gangrenous, No Petechiae. Inspection - Right - Not Gangrenous, No Petechiae.  Neurologic Neurologic evaluation reveals -normal attention span and ability to concentrate, able to name objects and repeat phrases. Appropriate fund of knowledge and normal coordination.  Neuropsychiatric Mental status exam performed with findings of-able to articulate well with normal speech/language, rate, volume and coherence and no  evidence of hallucinations, delusions, obsessions or homicidal/suicidal ideation. Orientation-oriented X3. Note: Somewhat anxious. Interrupts. Somewhat consolable.  Musculoskeletal Global Assessment Gait and Station - normal gait and station. Note: She was concerned about some swelling in her right lower extremity. I cannot see any significant difference in her thighs Delane Ginger /legs no fluctuance.  Lymphatic General Lymphatics Description - No Generalized lymphadenopathy.    Assessment & Plan Catherine Hector MD; 11/12/2020 2:26 PM)  ABDOMINAL PAIN, RIGHT LOWER QUADRANT (R10.31) Impression: Pain in right lower quadrant with increasing asymmetry and some subcutaneous mass suspicious for reducible hernia. Between her old right subcostal incision and right lower quadrant port site. That part of her abdominal wall is rather thin on CT scan with no classic Spigelian hernia but more obvious when she stands.  Because of the newer onset in January with increasing size and clear asymmetry now, I think she may have a hernia that explains some of her discomfort. His challenge in a patient with IBS constipation issues now on 5 different fiber and other laxatives supplements to get under control. She claims that is working better under Dr. Thayer Headings guidance.  I offered diagnostic laparoscopy lysis adhesions and possible mesh repair. She's been infection free for over a year. For some reason she wants [facility] only. We'll try and make that happen.  This colostomy opportunity to try and do a triple neurectomy given her chronic groin pain. She is a little guarded on that but is open to the idea since she still struggles with the chronic groin pain. She feels like she can live without just not the abdominal pain.  I strongly suspect that anxiety and almost obsessive-compulsive tendencies or exacerbating her symptoms. Recommend she consider trial of an SSRI such as fluoxetine. I'll recheck her primary  care physician to see if that is an option. Some people with chronic pain finding benefit with that lowering the intensity and frequency of her symptoms since she feels like its constant with intermittent flares.  Try to give her time to address her concerns and needs. I think she is willing to proceed somewhat guardedly.  Current Plans Pt Education - [facility] Good Bowel Health (Kanae Ignatowski)  GROIN PAIN, CHRONIC, RIGHT (R10.31) Impression: Pain related to chronic inflammation from her multiple groin incisions and chronic infection.  Her  incision and groin looks the best that seen in a long while.  She claims she still has pain but can work with it. Her physical activity level seems completely normal to me. I could consider a triple neurectomy to see if that would help all that I could cause some numbing and other issues. She is terrified about any potential thigh weakness or Dr. issues. She is against any nerve blocks. However she is open to considering the triple neurectomies if he could help with her chronic pain while I am there  If she still struggles after surgery can revisit the idea of a pain specialist. She seemed more interested at this time around.  Current Plans Return to clinic as needed.  Soreness, decreased appetite, and poor energy level are common problems after surgery. While many people can struggle with a bad day, these concerns should gradually fade away or at least improve. Much of your recovery depends on your health & the severity of your operation. Please call if you have any further questions / concerns related to surgery.  Increase activity as tolerated to regular everyday activity. Consider daily low impact exercise every day such as walking an hour a day.  Do not push through pain. If it hurts to do it, then don't do it.  Diet as tolerated. Low fat high fiber diet ideal. 30 g fiber a day ideal. Consider taking a daily fiber supplement to keep your bowels  regular.  Followup with your primary care physician for other health issues as would normally be done.  Consider screening for malignancies (breast, prostate, colon, melanoma, etc) as appropriate. Discuss with you primary care physician.  Pt Education - [facility] Pain Control (Delita Chiquito)  PREOP - [place] - ENCOUNTER FOR PREOPERATIVE EXAMINATION FOR GENERAL SURGICAL PROCEDURE (Z01.818)  Current Plans You are being scheduled for surgery- Our schedulers will call you.  You should hear from our office's scheduling department within 5 working days about the location, date, and time of surgery. We try to make accommodations for patient's preferences in scheduling surgery, but sometimes the OR schedule or the surgeon's schedule prevents [place] from making those accommodations.  If you have not heard from our office [phone]) in 5 working days, call the office and ask for your surgeon's nurse.  If you have other questions about your diagnosis, plan, or surgery, call the office and ask for your surgeon's nurse.  Written instructions provided [facility] Consent - Hernia Repair - Ventral/Incisional/Umbilical (Raidyn Breiner): discussed with patient and provided information. Pt Education - [facility] Hernia Post-Op HCI (Ilian Wessell): discussed with patient and provided information. Pt Education - [facility] Pain Control (Tallie Dodds) Pt Education - Pamphlet Given - Laparoscopic Hernia Repair: discussed with patient and provided information. Pt Education - [facility] Mesh education: discussed with patient and provided information.  IRRITABLE BOWEL SYNDROME WITH CONSTIPATION (K58.1)  Current Plans Pt Education - [facility] Good Bowel Health (Deajah Erkkila) Pt Education - [facility] IBS patient info: discussed with patient and provided information.  ANXIETY AND DEPRESSION (F41.9) Impression: Her anxiety and depression seen to be worse.  I recommend she consider an SSRI (Zoloft, etc) to help since I think she has some obsessive-compulsive tendencies with her chronic pain.  That can help calm things down. Good help with some anxiety & depression. She was initially skeptical and then open to it. She claims someone gave her Lexapro and she didn't want to take it thought it needed to be monitored. We'll see if her primary care physician agrees.  I did recommend that  the patient needs to stay on it for 3 months at the very least for time for it to kick in.  Another option is to consider work with the therapist but I think she will be very resistant to that.  Catherine Hector, MD, FACS, MASCRS  Esophageal, Gastrointestinal & Colorectal Surgery Robotic and Minimally Invasive Surgery [facility] [address] [place], [place] [address] [phone] Fax [phone] Main/Paging  CONTACT INFORMATION: Weekday (9AM-5PM) concerns: Call [facility] main office at [phone] Weeknight (5PM-9AM) or Weekend/Holiday concerns: Check [email or web address] for General Surgery [facility] coverage (Please, do not use SecureChat as it is not reliable communication to operating surgeons for immediate patient care)

## 2020-11-22 ENCOUNTER — Other Ambulatory Visit: Payer: Self-pay | Admitting: Family Medicine

## 2020-11-22 DIAGNOSIS — Z1231 Encounter for screening mammogram for malignant neoplasm of breast: Secondary | ICD-10-CM

## 2020-12-04 ENCOUNTER — Ambulatory Visit: Payer: Self-pay | Attending: Internal Medicine

## 2020-12-04 ENCOUNTER — Other Ambulatory Visit (HOSPITAL_BASED_OUTPATIENT_CLINIC_OR_DEPARTMENT_OTHER): Payer: Self-pay

## 2020-12-04 ENCOUNTER — Other Ambulatory Visit: Payer: Self-pay

## 2020-12-04 DIAGNOSIS — Z23 Encounter for immunization: Secondary | ICD-10-CM

## 2020-12-04 MED ORDER — COVID-19 MRNA VACC (MODERNA) 100 MCG/0.5ML IM SUSP
INTRAMUSCULAR | 0 refills | Status: DC
  Filled 2020-12-04: qty 0.25, 1d supply, fill #0

## 2020-12-04 NOTE — Progress Notes (Signed)
   Covid-19 Vaccination Clinic  Name:  Catherine [name]    MRN: [id number] DOB: 07-Jul-1946  12/04/2020  Catherine [name] was observed post Covid-19 immunization for 15 minutes without incident. She was provided with Vaccine Information Sheet and instruction to access the V-Safe system.   Catherine [name] was instructed to call 911 with any severe reactions post vaccine: Marland Kitchen Difficulty breathing  . Swelling of face and throat  . A fast heartbeat  . A bad rash all over body  . Dizziness and weakness   Immunizations Administered    Name Date Dose VIS Date Route   Moderna Covid-19 Booster Vaccine 12/04/2020 10:03 AM 0.25 mL 05/23/2020 Intramuscular   Manufacturer: Moderna   Lot: [id number]   [place]: [id number]

## 2020-12-24 ENCOUNTER — Other Ambulatory Visit: Payer: Self-pay | Admitting: Surgery

## 2020-12-24 DIAGNOSIS — I712 Thoracic aortic aneurysm, without rupture, unspecified: Secondary | ICD-10-CM

## 2021-01-04 NOTE — Patient Instructions (Addendum)
DUE TO COVID-19 ONLY ONE VISITOR IS ALLOWED TO COME WITH YOU AND STAY IN THE WAITING ROOM ONLY DURING PRE OP AND PROCEDURE DAY OF SURGERY. THE 2 VISITORS  MAY VISIT WITH YOU AFTER SURGERY IN YOUR PRIVATE ROOM DURING VISITING HOURS ONLY!                Catherine [name]    Your procedure is scheduled on: 01/24/21   Report to [facility] Main  Entrance   Report to admitting at  12:00 PM     Call this number if you have problems the morning of surgery [phone]     . BRUSH YOUR TEETH MORNING OF SURGERY AND RINSE YOUR MOUTH OUT, NO CHEWING GUM CANDY OR MINTS.   No food after midnight.    You may have clear liquid until 12:00 PM    CLEAR LIQUID DIET   Foods Allowed                                                                     Foods Excluded  Coffee and tea, regular and decaf                             liquids that you cannot  Plain Jell-O any favor except red or purple                                           see through such as: Fruit ices (not with fruit pulp)                                     milk, soups, orange juice  Iced Popsicles                                    All solid food Carbonated beverages, regular and diet                                    Cranberry, grape and apple juices Sports drinks like Gatorade Lightly seasoned clear broth or consume(fat free) Sugar, honey syrup      At 11:30 AM drink pre surgery drink  . Nothing by mouth after 12:30 PM.   Take these medicines the morning of surgery with A SIP OF WATER: Nexium. Use your inhalers and bring them with you                                 You may not have any metal on your body including hair pins and              piercings  Do not wear jewelry, make-up, lotions, powders or perfumes, deodorant             Do not wear nail polish on your fingernails.  Do not shave  48 hours prior to surgery.     Do not bring valuables to the hospital. [place].  Contacts, dentures or bridgework may not be worn into surgery.      Patients discharged the day of surgery will not be allowed to drive home.   IF YOU ARE HAVING SURGERY AND GOING HOME THE SAME DAY, YOU MUST HAVE AN ADULT TO DRIVE YOU HOME AND BE WITH YOU FOR 24 HOURS  YOU MAY GO HOME BY TAXI OR UBER OR ORTHERWISE, BUT AN ADULT MUST ACCOMPANY YOU HOME AND STAY WITH YOU FOR 24 HOURS.  Name and phone number of your driver:  Special Instructions: N/A              Please read over the following fact sheets you were given: _____________________________________________________________________             [facility] - Preparing for Surgery Before surgery, you can play an important role.  Because skin is not sterile, your skin needs to be as free of germs as possible.  You can reduce the number of germs on your skin by washing with CHG (chlorahexidine gluconate) soap before surgery.  CHG is an antiseptic cleaner which kills germs and bonds with the skin to continue killing germs even after washing. Please DO NOT use if you have an allergy to CHG or antibacterial soaps.  If your skin becomes reddened/irritated stop using the CHG and inform your nurse when you arrive at Short Stay. Do not shave (including legs and underarms) for at least 48 hours prior to the first CHG shower.   Please follow these instructions carefully:  1.  Shower with CHG Soap the night before surgery and the  morning of Surgery.  2.  If you choose to wash your hair, wash your hair first as usual with your  normal  shampoo.  3.  After you shampoo, rinse your hair and body thoroughly to remove the  shampoo.                                        4.  Use CHG as you would any other liquid soap.  You can apply chg directly  to the skin and wash                       Gently with a scrungie or clean washcloth.  5.  Apply the CHG Soap to your body ONLY FROM THE NECK DOWN.   Do not use on face/ open                            Wound or open sores. Avoid contact with eyes, ears mouth and genitals (private parts).                       Wash face,  Genitals (private parts) with your normal soap.             6.  Wash thoroughly, paying special attention to the area where your surgery  will be performed.  7.  Thoroughly rinse your body with warm water from the neck down.  8.  DO NOT shower/wash with your normal soap  after using and rinsing off  the CHG Soap.             9.  Pat yourself dry with a clean towel.            10.  Wear clean pajamas.            11.  Place clean sheets on your bed the night of your first shower and do not  sleep with pets. Day of Surgery : Do not apply any lotions/deodorants the morning of surgery.  Please wear clean clothes to the hospital/surgery center.  FAILURE TO FOLLOW THESE INSTRUCTIONS MAY RESULT IN THE CANCELLATION OF YOUR SURGERY PATIENT SIGNATURE_________________________________  NURSE SIGNATURE__________________________________  ________________________________________________________________________

## 2021-01-09 DIAGNOSIS — G894 Chronic pain syndrome: Secondary | ICD-10-CM | POA: Diagnosis not present

## 2021-01-09 DIAGNOSIS — F330 Major depressive disorder, recurrent, mild: Secondary | ICD-10-CM | POA: Diagnosis not present

## 2021-01-09 DIAGNOSIS — L739 Follicular disorder, unspecified: Secondary | ICD-10-CM | POA: Diagnosis not present

## 2021-01-10 DIAGNOSIS — Z1231 Encounter for screening mammogram for malignant neoplasm of breast: Secondary | ICD-10-CM

## 2021-01-14 ENCOUNTER — Encounter (HOSPITAL_COMMUNITY)
Admission: RE | Admit: 2021-01-14 | Discharge: 2021-01-14 | Disposition: A | Discharge status: Home / Self Care | Payer: PPO | Source: Ambulatory Visit | Attending: Surgery | Admitting: Surgery

## 2021-01-14 ENCOUNTER — Other Ambulatory Visit: Payer: Self-pay

## 2021-01-14 ENCOUNTER — Encounter (HOSPITAL_COMMUNITY): Payer: Self-pay

## 2021-01-14 DIAGNOSIS — Z01818 Encounter for other preprocedural examination: Secondary | ICD-10-CM | POA: Insufficient documentation

## 2021-01-14 LAB — CBC
HCT: 40.1 % (ref 36.0–46.0)
Hemoglobin: 13.3 g/dL (ref 12.0–15.0)
MCH: 29.9 pg (ref 26.0–34.0)
MCHC: 33.2 g/dL (ref 30.0–36.0)
MCV: 90.1 fL (ref 80.0–100.0)
Platelets: 195 10*3/uL (ref 150–400)
RBC: 4.45 MIL/uL (ref 3.87–5.11)
RDW: 13.5 % (ref 11.5–15.5)
WBC: 6.6 10*3/uL (ref 4.0–10.5)
nRBC: 0 % (ref 0.0–0.2)

## 2021-01-14 LAB — BASIC METABOLIC PANEL
Anion gap: 6 (ref 5–15)
BUN: 16 mg/dL (ref 8–23)
CO2: 24 mmol/L (ref 22–32)
Calcium: 9 mg/dL (ref 8.9–10.3)
Chloride: 110 mmol/L (ref 98–111)
Creatinine, Ser: 1.02 mg/dL — ABNORMAL HIGH (ref 0.44–1.00)
GFR, Estimated: 57 mL/min — ABNORMAL LOW (ref >60)
Glucose, Bld: 114 mg/dL — ABNORMAL HIGH (ref 70–99)
Potassium: 3.8 mmol/L (ref 3.5–5.1)
Sodium: 140 mmol/L (ref 135–145)

## 2021-01-14 NOTE — Progress Notes (Signed)
COVID Vaccine Completed:Yes Date COVID Vaccine completed:09/30/19-booster 06/06/20 COVID vaccine manufacturer:   Moderna    PCP - Dr. Solon Palm Cardiologist - no  Chest x-ray - no EKG - 01/14/21-chart Stress Test - no ECHO - no Cardiac Cath - no Pacemaker/ICD device last checked:NA  Sleep Study - no CPAP -   Fasting Blood Sugar - NA Checks Blood Sugar _____ times a day  Blood Thinner Instructions:NA Aspirin Instructions: Last Dose:  Anesthesia review:   Patient denies shortness of breath, fever, cough and chest pain at PAT appointment Pt has COPD and gets SOB climbing stairs and sometimes doing housework.Her breathing has improved since starting [place].  Patient verbalized understanding of instructions that were given to them at the PAT appointment. Patient was also instructed that they will need to review over the PAT instructions again at home before surgery. Yes

## 2021-01-21 ENCOUNTER — Other Ambulatory Visit (HOSPITAL_COMMUNITY)
Admission: RE | Admit: 2021-01-21 | Discharge: 2021-01-21 | Disposition: A | Discharge status: Home / Self Care | Payer: PPO | Source: Ambulatory Visit | Attending: Surgery | Admitting: Surgery

## 2021-01-21 DIAGNOSIS — Z01812 Encounter for preprocedural laboratory examination: Secondary | ICD-10-CM | POA: Diagnosis not present

## 2021-01-21 DIAGNOSIS — Z20822 Contact with and (suspected) exposure to covid-19: Secondary | ICD-10-CM | POA: Diagnosis not present

## 2021-01-21 LAB — SARS CORONAVIRUS 2 (TAT 6-24 HRS): SARS Coronavirus 2: NEGATIVE

## 2021-01-23 MED ORDER — CLINDAMYCIN PHOSPHATE 900 MG/50ML IV SOLN
900.0000 mg | INTRAVENOUS | Status: AC
  Administered 2021-01-24: 900 mg via INTRAVENOUS
  Filled 2021-01-23: qty 50

## 2021-01-23 MED ORDER — BUPIVACAINE LIPOSOME 1.3 % IJ SUSP
20.0000 mL | Freq: Once | INTRAMUSCULAR | Status: DC
  Filled 2021-01-23 (×2): qty 20

## 2021-01-23 MED ORDER — GENTAMICIN SULFATE 40 MG/ML IJ SOLN
5.0000 mg/kg | INTRAVENOUS | Status: AC
  Administered 2021-01-24: 370 mg via INTRAVENOUS
  Filled 2021-01-23: qty 9.25

## 2021-01-24 ENCOUNTER — Ambulatory Visit (HOSPITAL_COMMUNITY): Payer: PPO | Admitting: Anesthesiology

## 2021-01-24 ENCOUNTER — Encounter (HOSPITAL_COMMUNITY): Payer: Self-pay | Admitting: Surgery

## 2021-01-24 ENCOUNTER — Ambulatory Visit (HOSPITAL_COMMUNITY): Payer: PPO | Admitting: Physician Assistant

## 2021-01-24 ENCOUNTER — Encounter (HOSPITAL_COMMUNITY)
Admission: RE | Disposition: A | Discharge status: Home / Self Care | Payer: Self-pay | Source: Home / Self Care | Attending: Surgery

## 2021-01-24 ENCOUNTER — Ambulatory Visit (HOSPITAL_COMMUNITY)
Admission: RE | Admit: 2021-01-24 | Discharge: 2021-01-24 | Disposition: A | Discharge status: Home / Self Care | Payer: PPO | Attending: Surgery | Admitting: Surgery

## 2021-01-24 DIAGNOSIS — Z881 Allergy status to other antibiotic agents status: Secondary | ICD-10-CM | POA: Diagnosis not present

## 2021-01-24 DIAGNOSIS — K581 Irritable bowel syndrome with constipation: Secondary | ICD-10-CM | POA: Insufficient documentation

## 2021-01-24 DIAGNOSIS — Z8719 Personal history of other diseases of the digestive system: Secondary | ICD-10-CM | POA: Insufficient documentation

## 2021-01-24 DIAGNOSIS — Z8619 Personal history of other infectious and parasitic diseases: Secondary | ICD-10-CM | POA: Insufficient documentation

## 2021-01-24 DIAGNOSIS — K660 Peritoneal adhesions (postprocedural) (postinfection): Secondary | ICD-10-CM | POA: Diagnosis not present

## 2021-01-24 DIAGNOSIS — L821 Other seborrheic keratosis: Secondary | ICD-10-CM | POA: Diagnosis not present

## 2021-01-24 DIAGNOSIS — K439 Ventral hernia without obstruction or gangrene: Secondary | ICD-10-CM | POA: Diagnosis not present

## 2021-01-24 DIAGNOSIS — Z882 Allergy status to sulfonamides status: Secondary | ICD-10-CM | POA: Diagnosis not present

## 2021-01-24 DIAGNOSIS — K432 Incisional hernia without obstruction or gangrene: Secondary | ICD-10-CM | POA: Diagnosis not present

## 2021-01-24 DIAGNOSIS — L820 Inflamed seborrheic keratosis: Secondary | ICD-10-CM | POA: Diagnosis not present

## 2021-01-24 DIAGNOSIS — Z880 Allergy status to penicillin: Secondary | ICD-10-CM | POA: Diagnosis not present

## 2021-01-24 DIAGNOSIS — F418 Other specified anxiety disorders: Secondary | ICD-10-CM | POA: Diagnosis not present

## 2021-01-24 DIAGNOSIS — K219 Gastro-esophageal reflux disease without esophagitis: Secondary | ICD-10-CM | POA: Diagnosis not present

## 2021-01-24 HISTORY — PX: LAPAROSCOPY: SHX197

## 2021-01-24 HISTORY — PX: VENTRAL HERNIA REPAIR: SHX424

## 2021-01-24 SURGERY — LAPAROSCOPY, DIAGNOSTIC
Anesthesia: General | Site: Abdomen

## 2021-01-24 MED ORDER — CHLORHEXIDINE GLUCONATE CLOTH 2 % EX PADS: 6.0000 | MEDICATED_PAD | Freq: Once | CUTANEOUS | Status: DC

## 2021-01-24 MED ORDER — FENTANYL CITRATE (PF) 250 MCG/5ML IJ SOLN
INTRAMUSCULAR | Status: DC | PRN
  Administered 2021-01-24 (×4): 50 ug via INTRAVENOUS
  Administered 2021-01-24: 100 ug via INTRAVENOUS
  Administered 2021-01-24 (×3): 50 ug via INTRAVENOUS

## 2021-01-24 MED ORDER — ACETAMINOPHEN 500 MG PO TABS
1000.0000 mg | ORAL_TABLET | Freq: Once | ORAL | Status: AC
  Administered 2021-01-24: 1000 mg via ORAL
  Filled 2021-01-24: qty 2

## 2021-01-24 MED ORDER — ONDANSETRON HCL 4 MG/2ML IJ SOLN
INTRAMUSCULAR | Status: DC | PRN
  Administered 2021-01-24: 4 mg via INTRAVENOUS

## 2021-01-24 MED ORDER — FENTANYL CITRATE (PF) 100 MCG/2ML IJ SOLN
25.0000 ug | INTRAMUSCULAR | Status: DC | PRN
  Administered 2021-01-24 (×3): 50 ug via INTRAVENOUS

## 2021-01-24 MED ORDER — ROCURONIUM BROMIDE 10 MG/ML (PF) SYRINGE
PREFILLED_SYRINGE | INTRAVENOUS | Status: DC | PRN
  Administered 2021-01-24: 20 mg via INTRAVENOUS
  Administered 2021-01-24: 70 mg via INTRAVENOUS
  Administered 2021-01-24: 10 mg via INTRAVENOUS
  Administered 2021-01-24 (×2): 20 mg via INTRAVENOUS

## 2021-01-24 MED ORDER — GABAPENTIN 300 MG PO CAPS
300.0000 mg | ORAL_CAPSULE | ORAL | Status: AC
  Administered 2021-01-24: 300 mg via ORAL
  Filled 2021-01-24: qty 1

## 2021-01-24 MED ORDER — SUGAMMADEX SODIUM 200 MG/2ML IV SOLN
INTRAVENOUS | Status: DC | PRN
  Administered 2021-01-24: 200 mg via INTRAVENOUS

## 2021-01-24 MED ORDER — TRAMADOL HCL 50 MG PO TABS: 50.0000 mg | ORAL_TABLET | Freq: Four times a day (QID) | ORAL | 0 refills | Status: DC | PRN

## 2021-01-24 MED ORDER — BUPIVACAINE LIPOSOME 1.3 % IJ SUSP
INTRAMUSCULAR | Status: DC | PRN
  Administered 2021-01-24: 20 mL

## 2021-01-24 MED ORDER — ENSURE PRE-SURGERY PO LIQD
592.0000 mL | Freq: Once | ORAL | Status: DC
  Filled 2021-01-24: qty 592

## 2021-01-24 MED ORDER — LIDOCAINE 2% (20 MG/ML) 5 ML SYRINGE
INTRAMUSCULAR | Status: AC
  Filled 2021-01-24: qty 5

## 2021-01-24 MED ORDER — ROCURONIUM BROMIDE 10 MG/ML (PF) SYRINGE
PREFILLED_SYRINGE | INTRAVENOUS | Status: AC
  Filled 2021-01-24: qty 10

## 2021-01-24 MED ORDER — ORAL CARE MOUTH RINSE: 15.0000 mL | Freq: Once | OROMUCOSAL | Status: AC

## 2021-01-24 MED ORDER — KETOROLAC TROMETHAMINE 15 MG/ML IJ SOLN
INTRAMUSCULAR | Status: DC | PRN
  Administered 2021-01-24: 15 mg via INTRAVENOUS

## 2021-01-24 MED ORDER — FENTANYL CITRATE (PF) 100 MCG/2ML IJ SOLN
INTRAMUSCULAR | Status: AC
  Filled 2021-01-24: qty 4

## 2021-01-24 MED ORDER — CHLORHEXIDINE GLUCONATE 0.12 % MT SOLN
15.0000 mL | Freq: Once | OROMUCOSAL | Status: AC
  Administered 2021-01-24: 15 mL via OROMUCOSAL

## 2021-01-24 MED ORDER — PHENYLEPHRINE 40 MCG/ML (10ML) SYRINGE FOR IV PUSH (FOR BLOOD PRESSURE SUPPORT)
PREFILLED_SYRINGE | INTRAVENOUS | Status: AC
  Filled 2021-01-24: qty 10

## 2021-01-24 MED ORDER — PROPOFOL 10 MG/ML IV BOLUS
INTRAVENOUS | Status: DC | PRN
  Administered 2021-01-24: 70 mg via INTRAVENOUS
  Administered 2021-01-24: 30 mg via INTRAVENOUS

## 2021-01-24 MED ORDER — 0.9 % SODIUM CHLORIDE (POUR BTL) OPTIME
TOPICAL | Status: DC | PRN
  Administered 2021-01-24: 1000 mL

## 2021-01-24 MED ORDER — MIDAZOLAM HCL 2 MG/2ML IJ SOLN
1.0000 mg | Freq: Once | INTRAMUSCULAR | Status: AC
  Administered 2021-01-24: 1 mg via INTRAVENOUS

## 2021-01-24 MED ORDER — BUPIVACAINE-EPINEPHRINE 0.25% -1:200000 IJ SOLN
INTRAMUSCULAR | Status: DC | PRN
  Administered 2021-01-24: 60 mL

## 2021-01-24 MED ORDER — LIDOCAINE 2% (20 MG/ML) 5 ML SYRINGE
INTRAMUSCULAR | Status: DC | PRN
  Administered 2021-01-24: 60 mg via INTRAVENOUS

## 2021-01-24 MED ORDER — ONDANSETRON HCL 4 MG/2ML IJ SOLN
INTRAMUSCULAR | Status: AC
  Filled 2021-01-24: qty 2

## 2021-01-24 MED ORDER — HYDROMORPHONE HCL 1 MG/ML IJ SOLN
0.2500 mg | INTRAMUSCULAR | Status: DC | PRN
Start: 2021-01-24 — End: 2021-01-24
  Administered 2021-01-24 (×2): 0.5 mg via INTRAVENOUS

## 2021-01-24 MED ORDER — ENSURE PRE-SURGERY PO LIQD
296.0000 mL | Freq: Once | ORAL | Status: DC
  Filled 2021-01-24: qty 296

## 2021-01-24 MED ORDER — KETOROLAC TROMETHAMINE 30 MG/ML IJ SOLN
INTRAMUSCULAR | Status: AC
  Filled 2021-01-24: qty 1

## 2021-01-24 MED ORDER — FENTANYL CITRATE (PF) 100 MCG/2ML IJ SOLN
INTRAMUSCULAR | Status: AC
  Filled 2021-01-24: qty 2

## 2021-01-24 MED ORDER — BUPIVACAINE-EPINEPHRINE (PF) 0.5% -1:200000 IJ SOLN
INTRAMUSCULAR | Status: AC
  Filled 2021-01-24: qty 60

## 2021-01-24 MED ORDER — DEXAMETHASONE SODIUM PHOSPHATE 10 MG/ML IJ SOLN
INTRAMUSCULAR | Status: DC | PRN
  Administered 2021-01-24: 5 mg via INTRAVENOUS

## 2021-01-24 MED ORDER — BUPIVACAINE-EPINEPHRINE (PF) 0.25% -1:200000 IJ SOLN
INTRAMUSCULAR | Status: AC
  Filled 2021-01-24: qty 60

## 2021-01-24 MED ORDER — ACETAMINOPHEN 500 MG PO TABS: 1000.0000 mg | ORAL_TABLET | ORAL | Status: DC

## 2021-01-24 MED ORDER — FENTANYL CITRATE (PF) 250 MCG/5ML IJ SOLN
INTRAMUSCULAR | Status: AC
  Filled 2021-01-24: qty 5

## 2021-01-24 MED ORDER — HYDROMORPHONE HCL 1 MG/ML IJ SOLN
INTRAMUSCULAR | Status: AC
  Filled 2021-01-24: qty 1

## 2021-01-24 MED ORDER — PHENYLEPHRINE 40 MCG/ML (10ML) SYRINGE FOR IV PUSH (FOR BLOOD PRESSURE SUPPORT)
PREFILLED_SYRINGE | INTRAVENOUS | Status: DC | PRN
  Administered 2021-01-24: 160 ug via INTRAVENOUS
  Administered 2021-01-24: 80 ug via INTRAVENOUS

## 2021-01-24 MED ORDER — MIDAZOLAM HCL 2 MG/2ML IJ SOLN
INTRAMUSCULAR | Status: AC
  Filled 2021-01-24: qty 2

## 2021-01-24 MED ORDER — STERILE WATER FOR IRRIGATION IR SOLN
Status: DC | PRN
  Administered 2021-01-24: 1000 mL

## 2021-01-24 MED ORDER — PROPOFOL 10 MG/ML IV BOLUS
INTRAVENOUS | Status: AC
  Filled 2021-01-24: qty 20

## 2021-01-24 MED ORDER — LACTATED RINGERS IR SOLN
Status: DC | PRN
  Administered 2021-01-24: 1000 mL

## 2021-01-24 MED ORDER — DEXAMETHASONE SODIUM PHOSPHATE 10 MG/ML IJ SOLN
INTRAMUSCULAR | Status: AC
  Filled 2021-01-24: qty 1

## 2021-01-24 MED ORDER — LACTATED RINGERS IV SOLN: INTRAVENOUS | Status: DC

## 2021-01-24 SURGICAL SUPPLY — 61 items
APL PRP STRL LF DISP 70% ISPRP (MISCELLANEOUS) ×2
APPLIER CLIP 5 13 M/L LIGAMAX5 (MISCELLANEOUS)
APR CLP MED LRG 5 ANG JAW (MISCELLANEOUS)
BINDER ABDOMINAL 12 ML 46-62 (SOFTGOODS) ×1 IMPLANT
CABLE HIGH FREQUENCY MONO STRZ (ELECTRODE) ×2 IMPLANT
CHLORAPREP W/TINT 26 (MISCELLANEOUS) ×3 IMPLANT
CLIP APPLIE 5 13 M/L LIGAMAX5 (MISCELLANEOUS) IMPLANT
COVER SURGICAL LIGHT HANDLE (MISCELLANEOUS) ×3 IMPLANT
COVER WAND RF STERILE (DRAPES) ×2 IMPLANT
DECANTER SPIKE VIAL GLASS SM (MISCELLANEOUS) ×3 IMPLANT
DEVICE SECURE STRAP 25 ABSORB (INSTRUMENTS) ×1 IMPLANT
DEVICE TROCAR PUNCTURE CLOSURE (ENDOMECHANICALS) ×3 IMPLANT
DRAPE UTILITY XL STRL (DRAPES) ×3 IMPLANT
DRAPE WARM FLUID 44X44 (DRAPES) ×3 IMPLANT
DRSG TEGADERM 2-3/8X2-3/4 SM (GAUZE/BANDAGES/DRESSINGS) ×7 IMPLANT
DRSG TEGADERM 4X4.75 (GAUZE/BANDAGES/DRESSINGS) ×3 IMPLANT
ELECT REM PT RETURN 15FT ADLT (MISCELLANEOUS) ×3 IMPLANT
GAUZE SPONGE 2X2 8PLY STRL LF (GAUZE/BANDAGES/DRESSINGS) ×2 IMPLANT
GLOVE SURG LTX SZ8 (GLOVE) ×3 IMPLANT
GLOVE SURG UNDER LTX SZ8 (GLOVE) ×3 IMPLANT
GOWN STRL REUS W/TWL XL LVL3 (GOWN DISPOSABLE) ×9 IMPLANT
IRRIG SUCT STRYKERFLOW 2 WTIP (MISCELLANEOUS) ×3
IRRIGATION SUCT STRKRFLW 2 WTP (MISCELLANEOUS) ×2 IMPLANT
KIT BASIN OR (CUSTOM PROCEDURE TRAY) ×3 IMPLANT
KIT TURNOVER KIT A (KITS) ×3 IMPLANT
MARKER SKIN DUAL TIP RULER LAB (MISCELLANEOUS) ×3 IMPLANT
MESH ULTRAPRO 6X6 15CM15CM (Mesh General) ×1 IMPLANT
MESH VENTRALIGHT ST 8X10 (Mesh General) ×1 IMPLANT
NDL SPNL 20GX3.5 QUINCKE YW (NEEDLE) IMPLANT
NDL SPNL 22GX3.5 QUINCKE BK (NEEDLE) IMPLANT
NEEDLE SPNL 20GX3.5 QUINCKE YW (NEEDLE) ×3 IMPLANT
NEEDLE SPNL 22GX3.5 QUINCKE BK (NEEDLE) ×3 IMPLANT
PAD POSITIONING PINK XL (MISCELLANEOUS) ×3 IMPLANT
PENCIL SMOKE EVACUATOR (MISCELLANEOUS) IMPLANT
PROTECTOR NERVE ULNAR (MISCELLANEOUS) ×1 IMPLANT
SCISSORS LAP 5X35 DISP (ENDOMECHANICALS) ×3 IMPLANT
SEALER TISSUE G2 STRG ARTC 35C (ENDOMECHANICALS) IMPLANT
SET TUBE SMOKE EVAC HIGH FLOW (TUBING) ×3 IMPLANT
SLEEVE ADV FIXATION 5X100MM (TROCAR) ×3 IMPLANT
SLEEVE XCEL OPT CAN 5 100 (ENDOMECHANICALS) ×6 IMPLANT
SPONGE GAUZE 2X2 STER 10/PKG (GAUZE/BANDAGES/DRESSINGS) ×1
STRIP CLOSURE SKIN 1/2X4 (GAUZE/BANDAGES/DRESSINGS) ×5 IMPLANT
SUT MNCRL AB 4-0 PS2 18 (SUTURE) ×3 IMPLANT
SUT PDS AB 1 CT1 27 (SUTURE) ×8 IMPLANT
SUT PROLENE 1 CT 1 30 (SUTURE) ×17 IMPLANT
SUT SILK 2 0 (SUTURE) ×3
SUT SILK 2 0 SH CR/8 (SUTURE) ×3 IMPLANT
SUT SILK 2-0 18XBRD TIE 12 (SUTURE) ×2 IMPLANT
SUT SILK 3 0 (SUTURE) ×3
SUT SILK 3 0 SH CR/8 (SUTURE) ×3 IMPLANT
SUT SILK 3-0 18XBRD TIE 12 (SUTURE) ×2 IMPLANT
SUT VIC AB 2-0 SH 27 (SUTURE) ×6
SUT VIC AB 2-0 SH 27X BRD (SUTURE) IMPLANT
TOWEL OR 17X26 10 PK STRL BLUE (TOWEL DISPOSABLE) ×3 IMPLANT
TOWEL OR NON WOVEN STRL DISP B (DISPOSABLE) ×3 IMPLANT
TRAY FOLEY MTR SLVR 16FR STAT (SET/KITS/TRAYS/PACK) IMPLANT
TRAY LAPAROSCOPIC (CUSTOM PROCEDURE TRAY) ×3 IMPLANT
TROCAR ADV FIXATION 11X100MM (TROCAR) IMPLANT
TROCAR ADV FIXATION 5X100MM (TROCAR) ×3 IMPLANT
TROCAR BLADELESS OPT 5 100 (ENDOMECHANICALS) ×3 IMPLANT
TROCAR XCEL NON-BLD 11X100MML (ENDOMECHANICALS) IMPLANT

## 2021-01-24 NOTE — Transfer of Care (Signed)
Immediate Anesthesia Transfer of Care Note  Patient: JENNY LAI  Procedure(s) Performed: LAPAROSCOPY DIAGNOSTIC LYSIS OF ADHESIONS, RIGHT INGUINAL AND INCISIONAL HERNIA REPAIR WITH MESH.  TAP BLOCK (Abdomen)  Patient Location: PACU  Anesthesia Type:General  Level of Consciousness: awake, alert  and patient cooperative  Airway & Oxygen Therapy: Patient Spontanous Breathing and Patient connected to face mask oxygen  Post-op Assessment: Report given to RN, Post -op Vital signs reviewed and stable and c/o of pain.  Post vital signs: Reviewed and stable  Last Vitals:  Vitals Value Taken Time  BP 137/71 01/24/21 1613  Temp    Pulse 75 01/24/21 1616  Resp 17 01/24/21 1616  SpO2 100 % 01/24/21 1616  Vitals shown include unvalidated device data.  Last Pain:  Vitals:   01/24/21 1049  PainSc: 3          Complications: No notable events documented.

## 2021-01-24 NOTE — Anesthesia Postprocedure Evaluation (Signed)
Anesthesia Post Note  Patient: SHAMELL HITTLE  Procedure(s) Performed: LAPAROSCOPY DIAGNOSTIC LYSIS OF ADHESIONS, RIGHT INGUINAL AND INCISIONAL HERNIA REPAIR WITH MESH.  TAP BLOCK (Abdomen)     Patient location during evaluation: PACU Anesthesia Type: General Level of consciousness: awake and alert Pain management: pain level controlled Vital Signs Assessment: post-procedure vital signs reviewed and stable Respiratory status: spontaneous breathing, nonlabored ventilation, respiratory function stable and patient connected to nasal cannula oxygen Cardiovascular status: blood pressure returned to baseline and stable Postop Assessment: no apparent nausea or vomiting Anesthetic complications: no   No notable events documented.  Last Vitals:  Vitals:   01/24/21 1715 01/24/21 1730  BP: (!) 106/48 (!) 100/49  Pulse: 61 (!) 57  Resp: 13 14  Temp:    SpO2: 100% 100%    Last Pain:  Vitals:   01/24/21 1730  PainSc: Asleep                 Marlita Keil L Trysta Showman

## 2021-01-24 NOTE — Progress Notes (Signed)
Note: Portions of this report may have been transcribed using voice recognition software. A sincere effort was made to ensure accuracy; however, inadvertent computerized transcription errors may be present.   Any transcriptional errors that result from this process are unintentional.        Catherine [name]  Feb 21, 1946 [id number]  Patient Care Team: Jonathon Jordan, MD as PCP - General (Family Medicine) Felecia Shelling, Nanine Means, MD as Consulting Physician (Neurology) Michael Boston, MD as Consulting Physician (General Surgery) Ronald Lobo, MD as Consulting Physician (Gastroenterology) Tommy Medal, Lavell Islam, MD as Consulting Physician (Infectious Diseases) Jackelyn Knife, MD as Rounding Team (Internal Medicine) Lorenda Cahill [place] Constable, MD as Consulting Physician (Infectious Diseases) Melrose Nakayama, MD as Consulting Physician (Orthopedic Surgery) Warren Danes, PA-C as Physician Assistant (Dermatology)  Patient in PACU with pain controlled.  She wishes to home.  New tramadol Rx given (out of old one.  She had gabapentin at home as well.  I discussed operative findings, updated the patient's status, discussed probable steps to recovery, and gave postoperative recommendations to the patient's spouse, Tate Jerkins.  Recommendations were made.  Questions were answered.  He expressed understanding & appreciation.   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   Groin abscess 06/16/2018   Anxiety    Urgency of urination    DDD (degenerative disc disease), lumbar    Osteopenia 08/04/2017   Right sided sciatica 02/18/2017   Piriformis muscle pain 02/18/2017   Right groin pain 11/04/2016   Neuropathy 11/04/2016   Numbness 11/04/2016   Dysfunction of right eustachian tube 10/30/2015   Presbycusis of both ears 10/30/2015   Sensory hearing loss, bilateral 10/30/2015   Subjective tinnitus of both ears 10/30/2015   Atypical chest pain 05/08/2014    Past Medical History:  Diagnosis Date   Abdominal pain 06/06/2019   Anxiety    Aortic aneurysm ([place]) 12/08/2018   Arthritis    HANDS   BCC (basal cell carcinoma) 04/07/1995   right jawline (CX3, 5FU)   BCC (basal cell carcinoma) 05/19/2013   right scalp/post (treatment biopsy)   Chronic constipation    Complication of anesthesia    upon waking up from anesthesia patient experienced chest pain - EKG done was negative, patient states "it may have been indigestion"   COPD (chronic obstructive pulmonary disease) ([place])    DDD (degenerative disc disease), lumbar    L2-3/3-4/4-5   Dizziness 01/17/2019   DOE (dyspnea on exertion) 01/17/2019   Dysplastic nevus 12/04/2009   mod-severe left forearm, exc   Dysuria    Frequency of urination    Ganglion cyst of right groin 11/23/2018   GERD (gastroesophageal reflux disease)    Hearing aid worn    Infected hernioplasty mesh ([place]) 01/26/2019   Keratosis, actinic 02/18/1991   left abdomen   Migraine    Mild emphysema ([place])    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 HUMPHRIES/GRAPEY   Wears glasses     Past Surgical History:   Procedure Laterality Date   ABDOMINAL HYSTERECTOMY  1982   W/  UNILATERAL SALPINGOOPHORECTOMY   bilateral hernia surgery      removed ganglion cyst and swollen lymph node    CHOLECYSTECTOMY OPEN  1987   W/  APPENDECTOMY   COLONOSCOPY  2002  &  12-2012 & 03/19/15   CYSTO WITH HYDRODISTENSION N/A 02/20/2014   Procedure: [place] AND MARCAINE INSTILLATION;  Surgeon: Bernestine Amass, MD;  Location: [facility];  Service: Urology;  Laterality: N/A;   EXCISION MASS LOWER EXTREMETIES Right 11/02/2018   Procedure: REMOVAL OF RIGHT GROIN AND THIGH SUBCUTANEOUS MASSES.;  Surgeon: Michael Boston, MD;  Location: [facility];  Service: General;  Laterality: Right;   EYE SURGERY     bilateral cataract surgery    GROIN DISSECTION N/A 02/28/2019   Procedure: GROIN EXPLORATION AND DEBRIDEMENT;  Surgeon: Michael Boston, MD;  Location: [facility];  Service: General;  Laterality: N/A;   HERNIA REPAIR     INCISION AND DRAINAGE ABSCESS N/A 12/09/2018   Procedure: INCISION AND DRAINAGE RIGHT GROIN and thigh  WITH DEBRIDEMENT;  Surgeon: Michael Boston, MD;  Location: [facility];  Service: General;  Laterality: N/A;   IR CATHETER TUBE CHANGE  05/05/2019   IR RADIOLOGIST EVAL & MGMT  05/03/2019   IR RADIOLOGIST EVAL & MGMT  05/10/2019   KNEE ARTHROSCOPY W/ MENISCAL REPAIR Right 2021   LAPAROSCOPIC ABDOMINAL EXPLORATION Right 02/28/2019   Procedure: LAPAROSCOPIC EXPLORATION WITH REMOVAL OF RIGHT PREPERITONEAL MESH;  Surgeon: Michael Boston, MD;  Location: [facility];  Service: General;  Laterality: Right;   LAPAROSCOPIC LYSIS OF ADHESIONS Bilateral 02/28/2019   Procedure: LAPAROSCOPIC LYSIS OF ADHESIONS;  Surgeon: Michael Boston, MD;  Location: [facility];  Service: General;  Laterality: Bilateral;   LAPAROSCOPIC UNILATERAL SALPINGOOPHORECTOMY  1984   LESION REMOVAL Right 02/28/2019   Procedure: EXCISION VAGINAL ABSCESS  AND DISSECTION;  Surgeon: Michael Boston, MD;  Location: [facility];   Service: General;  Laterality: Right;   MASS EXCISION Right 06/16/2018   Procedure: REMOVAL OF RIGHT GROIN SUBCUTANEOUS MASS ERAS PATHWAY;  Surgeon: Michael Boston, MD;  Location: [facility];  Service: General;  Laterality: Right;   removal of lymph node cyst in right groin      ROTATOR CUFF REPAIR Right 2003   and BONE SPUR    Social History   Socioeconomic History   Marital status: Married    Spouse name: Not on file   Number of children: Not on file   Years of education: Not on file   Highest education level: Not on file  Occupational History   Not on file  Tobacco Use   Smoking status: Former    Packs/day: 0.75    Years: 48.00    Pack years: 36.00    Types: Cigarettes    Quit date: 09/04/2017    Years since quitting: 3.3   Smokeless tobacco: Never  Vaping Use   Vaping Use: Never used  Substance and Sexual Activity   Alcohol use: No   Drug use: No   Sexual activity: Not on  file  Other Topics Concern   Not on file  Social History Narrative   Not on file   Social Determinants of Health   Financial Resource Strain: Not on file  Food Insecurity: Not on file  Transportation Needs: Not on file  Physical Activity: Not on file  Stress: Not on file  Social Connections: Not on file  Intimate Partner Violence: Not on file    Family History  Problem Relation Age of Onset   Esophageal cancer Mother 27   Lung cancer Father 2    Current Facility-Administered Medications  Medication Dose Route Frequency Provider Last Rate Last Admin   0.9 % irrigation (POUR BTL)    PRN Michael Boston, MD   1,000 mL at 01/24/21 1255   bupivacaine liposome (EXPAREL) 1.3 % injection 266 mg  20 mL Infiltration Once Michael Boston, MD       bupivacaine liposome (EXPAREL) 1.3 % injection    PRN Michael Boston, MD   20 mL at 01/24/21 1601   bupivacaine-EPINEPHrine (MARCAINE W/ EPI) 0.25% -1:200000 (with pres) injection    PRN Michael Boston, MD   60 mL at 01/24/21 1601   Chlorhexidine Gluconate Cloth 2  % PADS 6 each  6 each Topical Once Michael Boston, MD       feeding supplement (ENSURE PRE-SURGERY) liquid 296 mL  296 mL Oral Once Michael Boston, MD       feeding supplement (ENSURE PRE-SURGERY) liquid 592 mL  592 mL Oral Once Michael Boston, MD       fentaNYL (SUBLIMAZE) 100 MCG/2ML injection            fentaNYL (SUBLIMAZE) injection 25-50 mcg  25-50 mcg Intravenous Q5 min PRN Hulan Fray L, MD   50 mcg at 01/24/21 1631   lactated ringers infusion   Intravenous Continuous Duane Boston, MD 10 mL/hr at 01/24/21 1053 Restarted at 01/24/21 1209   lactated ringers irrigation solution    PRN Eloise Picone, Remo Lipps, MD   1,000 mL at 01/24/21 1322   sterile water for irrigation for irrigation    PRN Albaro Deviney, Remo Lipps, MD   1,000 mL at 01/24/21 1428     Allergies  Allergen Reactions   Ciprofloxacin Hcl Other (See Comments)    Aortic aneurysm per MD   Metoprolol Other (See Comments) and Cough    and wheezing   Penicillins Shortness Of Breath, Rash and Other (See Comments)    Has patient had a PCN reaction causing immediate rash, facial/tongue/throat swelling, SOB or lightheadedness with hypotension: Yes Has patient had a PCN reaction causing severe rash involving mucus membranes or skin necrosis: No Has patient had a PCN reaction that required hospitalization: Yes - MD office Has patient had a PCN reaction occurring within the last 10 years: No If all of the above answers are "NO", then may proceed with Cephalosporin use.    Oxycodone Other (See Comments)    Nightmares.  Tolerates tramadol   Fosamax [Alendronate]     Other reaction(s): GERD   Advair Diskus [Fluticasone-Salmeterol] Anxiety    BP (!) 114/57 (BP Location: Left Arm)   Pulse (!) 58   Temp 97.6 F (36.4 C)   Resp 14   Wt 73.5 kg   SpO2 99%   BMI 23.24 kg/m   No results found.

## 2021-01-24 NOTE — H&P (Signed)
Catherine [name]  DOB: 16-Dec-1945 Married / Language: Catherine [name] / Race: White Female   History of Present Illness Catherine Hector MD; 11/12/2020 2:24 PM) The patient is a 75 year old female presenting for a post-operative visit. Note for "Post-Operative": ` ` ` The patient returns   s/p excision of cyst in right groin 01/2016.    Pathology c/w lymphagioma & benign LN s/p excision of inner pelvic mass, bilateral femoral hernia repair, left inguinal hernia repair 01/2018.  Pathology c/w synovial cyst s/p proximal thigh mass 06/2018.  Pathology c/w syniovial cyst & benign LN Incision and drainage of small seroma right lower groin incision 09/20/2018 Incision and drainage of right groin and thigh abscesses with debridement 12/09/2018 Laparoscopic preperitoneal dissection and explantation of right sided preperitoneal ultra pro polypropylene mesh.  Reexcision of groin wounds. 02/28/2019       Patient sent over concerns of new right lower quadrant abdominal pain the past 3 months.   Patient returns.  I haven't seen her in 14 months.  She's had a complicated history with right groin synovial cysts and chronic atypical bacterial infections and hernias.  She has remained abstinent from smoking.  She tries to work on [employer].  She's had some inner thigh weakness she claims oh although working with physical therapy and exercise has recovered.  She notes new onset right lower quadrant abdominal pain (although she mentioned some pain 14 months ago).  Worse when she is sitting.  Worse with activity.  Struggle with worsening constipation.  She felt like the MiraLAX didn't work anymore.  She has been doing Benefiber and Colace and prune juice.  Discuss with Dr. Cristina [name], her gastrologist.  He did a colonoscopy last week that I do not have a report of.  She tells me was normal.  At scan did not show any recurrent hernia or groin sister lymphadenopathy.  Nonetheless she was sent back to me to see if I had anything else to  offer.   Patient still notes she has chronic groin pain been she "can live with that".  Some burning and discomfort.  She felt like when we had done nerve blocks in that region and made her inner thigh numb and weak for months.  I now she had some abductor weakness after the last surgery but seems to be walking normally now.  She does still frustrated by the pain.  She has depression and anxiety.  She believes she is been offered Lexapro "no one wanted to monitor with me & just kept throwing pills at me".   PRIOR NOTES: 2021:  Patient comes today by herself.  She completed all antibiotics and has been antibiotic free for the past month.  No purulence.  She still feels some right lower quadrant/groin/thigh discomfort.  Seems more prominent when she is constipated.  Takes Aleve in the morning and Tylenol at night.  Takes gabapentin twice a day.  Working with outpatient physical therapy.  Trying to do exercises.  However she just feels like her leg is still a Little but we cannot normal.  She is concerned her right knee is bothering her.  She is concerned that she has some swelling on her right side.  Minimal numbness or tingling in her proximal right inner thigh.  Improved overall.  Denies any new abdominal swelling.  She is disappointed that she is not better and worries something else is going on.  She had an MRI done in late December that showed no evidence of  any cellulitis, seroma, abscess.  Mild adductor muscle atrophy but no inflammation anymore as had been seen on prior films   06/2019: Patient returns.  Seen by infectious disease.  Concern for some swelling.  MRI of abdomen and pelvis done.  No residual fluid collection.  Mild inflammation of adductor thigh.  Subtle to my review.  Overall improved.  She comes in frustrated.  She notes that she is having pain and swelling again.  His convinced she has a hernia on her right lower abdomen.  She's been having low back pain again.  Taking Aleve for that.   However she doesn't like to take in more than once a day for stomach upset.  She's been trying to take Tylenol at night.  She cannot tolerate more than 100 mg a gabapentin or she feels quite sleepy and groggy.  Does not like to take narcotics either.  She notes she feels some pain and discomfort when she sitting for a while.  She's had some sciatic-like pain from her lower back as well.  Frustrated.  She is tired of being on antibiotics and hoping to get off them in the next 2 weeks.  No pain or discomfort on the left side.  Because of the Covid pandemic, she's been staying at home.  Can walk around the house okay but not doing much beyond errands.   Summer 2020:   Patient returns after surgery to explant the right-sided TEP mesh & re-excise the wound.  She developed seroma in the preperitoneal cavity and inspecting sintering.  Drainage output went down improved.  She had follow-up where they replaced the drain.  Didn't get much out of removed later.  No more drains as of last week.  She continues on her antibiotic regimen being followed by infectious disease.  Worried because of lumpiness is not totally gone away.  She does feel some groin discomfort when she was going back to trying her stretching exercises and walking.  Gabapentin helps but is very sedating.  Doing 100 mg the day and 200 at bedtime.  Psych she is constipated needing Senokot, Colace, MiraLAX.  As her bowels once a day with that regimen.   PROPIONIBACTERIUM ACNES in mesh AFB & Fungal negative Fluid in seroma cavity negative for any culture   06/14/2019 CLINICAL DATA:  Evaluate for abscess. Abdominal pain and fever.   EXAM: MRI ABDOMEN AND PELVIS WITHOUT AND WITH CONTRAST   TECHNIQUE: Multiplanar multisequence MR imaging of the abdomen and pelvis was performed both before and after the administration of intravenous contrast.   CONTRAST:  88mL GADAVIST GADOBUTROL 1 MMOL/ML IV SOLN   COMPARISON:  CT AP 04/09/2019    FINDINGS: COMBINED FINDINGS FOR BOTH MR ABDOMEN AND PELVIS   Lower chest: No acute findings.   Hepatobiliary: No mass or other parenchymal abnormality identified. Previous cholecystectomy. No significant biliary ductal dilatation.   Pancreas: No mass, inflammatory changes, or other parenchymal abnormality identified.   Spleen:  Within normal limits in size and appearance.   Adrenals/Urinary Tract: Normal appearance of the adrenal glands. No kidney mass or hydronephrosis identified. Urinary bladder is unremarkable   Stomach/Bowel: Visualized portions within the abdomen are unremarkable.   Vascular/Lymphatic: No pathologically enlarged lymph nodes identified. No abdominal aortic aneurysm demonstrated.   Reproductive: Status post hysterectomy. No adnexal masses.   Other: Postoperative changes within the right inguinal region are again identified compatible with previous herniorrhaphy. The large fluid collection within the right lower quadrant of the abdomen has resolved in  the interval status post percutaneous drainage. There is continued increased edema within the soft tissues surrounding the right inguinal canal.   Musculoskeletal: The visualized osseous structures are unremarkable.Asymmetric increased T2 signal and heterogeneous enhancement within right abductor musculature, image 38/5 and image 45/20. no discrete, drainable fluid collection identified at this time.   IMPRESSION: 1. Interval resolution large fluid collection within the right lower quadrant of the abdomen status post percutaneous drainage. 2. No discrete, drainable fluid collection identified at this time. 3. Increased T2 signal and enhancement within the right abductor musculature. This is nonspecific and may be inflammatory or infectious in etiology. Cannot rule out myositis. No discrete, drainable fluid collection identified at this time.     Electronically Signed  By: Kerby Moors M.D.  On:  06/14/2019 12:44     02/28/2019   9:50 PM   PATIENT:  [name]  75 y.o. female   Patient Care Team: Jonathon Jordan, MD as PCP - General (Family Medicine) Felecia Shelling, Nanine Means, MD as Consulting Physician (Neurology) Michael Boston, MD as Consulting Physician (General Surgery) Ronald Lobo, MD as Consulting Physician (Gastroenterology) Tommy Medal, Lavell Islam, MD as Consulting Physician (Infectious Diseases) Jackelyn Knife, MD as Rounding Team (Internal Medicine) Lorenda Cahill Arizona Constable, MD as Consulting Physician (Infectious Diseases)   PRE-OPERATIVE DIAGNOSIS:  RECURRENT RIGHT GROIN MYCOBACTERIAL INFECTION, HISTORY OF INGUINAL HERNIA REPAIR WITH MESH   POST-OPERATIVE DIAGNOSIS:   RECURRENT RIGHT GROIN MYCOBACTERIAL INFECTION HISTORY OF INGUINAL HERNIA REPAIR WITH MESH   PROCEDURE:   LAPAROSCOPIC EXPLORATION WITH REMOVAL OF RIGHT PREPERITONEAL MESH RIGHT GROIN EXPLORATION WITH RE-EXCISION OF GROIN ABSCESSES PRIMARY RIGHT INGUINAL HERNIA REPAIR LAPAROSCOPIC LYSIS OF ADHESIONS EXCISION RLQ ABDOMINAL WALL SCAR   SURGEON:  Catherine Hector, MD   ASSISTANT: None   ANESTHESIA:      Regional ilioinguinal and genitofemoral and spermatic cord nerve blocks General   EBL:  Total I/O In: 1000 [I.V.:1000] Out: 600 [Urine:350; Blood:250].  See anesthesia record   Delay start of Pharmacological VTE agent (>24hrs) due to surgical blood loss or risk of bleeding:  no   DRAINS: NONE   SPECIMEN: Right preperitoneal ultra pro mesh. Right lower quadrant dimpled scar, possible sinus tract Right groin and proximal thigh old scar with chronic abscesses   DISPOSITION OF SPECIMEN: Mesh and right groin abscesses sent for pathology as well as aerobic/anaerobic, fungal, TB/mycobacterial culture   COUNTS:  YES   PLAN OF CARE: Discharge to home after PACU   PATIENT DISPOSITION:  PACU - hemodynamically stable.   INDICATION: Patient with complicated medical history with recurrent right  groin lesions with prior excisions including lymphangioma, synovial cyst, chronic groin infections.  Mycobacterial.  Prior excisions with recurrence.  Infectious disease consultation.  Second opinion [employer].  Recommendation removed preperitoneal right groin mesh given the chronic draining wounds.  Technique was discussed at length with the patient.  Risks of nerve injury numbness decreased function hernia recurrence wound abscess recurrence etc. were discussed.   The anatomy & physiology of the abdominal wall and pelvic floor was discussed.  The pathophysiology of hernias in the inguinal and pelvic region was discussed.  Natural history risks such as progressive enlargement, pain, incarceration & strangulation was discussed.   Contributors to complications such as smoking, obesity, diabetes, prior surgery, etc were discussed.     Risks such as bleeding, infection, abscess, need for further treatment, heart attack, death, and other risks were discussed.  I noted a good likelihood this will help address the problem.  Goals of post-operative recovery were discussed as well.  Possibility that this will not correct all symptoms was explained.  I stressed the importance of low-impact activity, aggressive pain control, avoiding constipation, & not pushing through pain to minimize risk of post-operative chronic pain or injury. Possibility of reherniation was discussed.  We will work to minimize complications.      Questions were answered.  The patient expresses understanding & wishes to proceed with surgery.   OR FINDINGS: Very well incorporated preperitoneal mesh with no evidence of any purulence or infection in the preperitoneal space nor along the femoral or internal inguinal canals.  Not consistent with chronic mesh infection.   Sinus dimpled scar in right lower quadrant with fibrotic tract going down to fascia.  No stitch abscess or hernia recurrence.  No purulence or casing granulomas.  Wound  closed.   Right groin oblique scars with yellow caseating wounds/abscesses.  Widely excised down to inguinal floor with probable inguinal hernia at the base after debridement.  Inguinal floor primarily closed.  Distal aspect of proximal inner thigh wound well-healed without caseating granulomas.   DESCRIPTION:   The patient was identified & brought into the operating room. The patient was positioned supine with arms tucked. SCDs were active during the entire case. The patient underwent general anesthesia without any difficulty.  The abdomen was prepped and draped in a sterile fashion. The patient's bladder was emptied.  A Surgical Timeout confirmed our plan.   I made a transverse incision through the inferior umbilical fold.  I made a small transverse nick through the anterior rectus fascia contralateral to the inguinal hernia side and placed a 0-vicryl stitch through the fascia.  I placed a Hasson trocar into the preperitoneal plane.  Entry was clean.  We induced carbon dioxide insufflation. Camera inspection revealed no injury.  I used a 36mm angled scope to bluntly free the peritoneum off the infraumbilical anterior abdominal wall.  I created enough of a preperitoneal pocket to place 87mm ports into the right & left mid-abdomen into this preperitoneal cavity.   I focused attention on the RIGHT pelvis since that was the dominant hernia side.   I used blunt & focused sharp dissection to free the peritoneum off the flank and down to the pubic rim.  Encountered dense inflammation consistent with the mesh.  Initially freed the Ultrapro mesh off the anterior abdominal wall and the oblique muscles on the right lateral aspect.  Came down to the pubic rim which was densely adherent and freed it off the inner pelvis.  I then began to lift the mesh off the peritoneum through focused blunt as well as cautery scissors dissection.  I was able to freed off the right anterior flank and psoas and come more medially.  Is  able to freed off the anterior pelvis and bladder and come laterally.  The most dense adhesions were to the iliac artery specially.  Eventually freed that off carefully using focus cautery scissors and sharp dissection.  Eventually was able to free off and remove the mesh pretty intact.  Did have to go into the peritoneum and a few spots to take peritoneum to get the mesh out.  On reinspection there was a patch of mesh still on the iliac artery more proximally.  Carefully freed this patch off until the iliac artery was well exposed with no mesh adhered to it.  Fortunately the adhesions to the iliac vein were soft with a fat layer, so I did not have  to do any sharp dissection to free the mesh off it.  There was no giant inguinal or femoral hernia from the inside.  Mesh was removed out the umbilical port.   I did copious irrigation.  I assured hemostasis.  I had the circulator fill up the bladder with isotonic solution stained with methylene blue up to 400 mL.  Got good bladder distention without any evidence of any bladder leak or other abnormality the preperitoneal or intraperitoneal space.  I primarily closed the peritoneal defects in the right lower quadrant using 2-0 Vicryl intracorporeal laparoscopic suturing to good result.  Did copious irrigation few more liters and had good hemostasis.  I evacuated the carbon dioxide.  Ports were removed.  Infraumbilical fascia was primary closed using 0 Vicryl interrupted suture.  I closed the port sites with 4-0 Monocryl suture at the 5 mm port sites.  Sterile gauze and dressing applied.   Next focused on the right lower quadrant groin.  There was a dimple diverting scar in the right lower quadrant region.  I made a transverse prior concave incision around it and came around sharply to find a scar/sinus tract going down to the fascia.  There was no stitch abscess or retained foreign body suture.  Sent that off for culture.  I encountered no abscess nor fat necrosis.  I  primarily closed that wound with interrupted Monocryl suture and sterile dressing.   Next I focused on the right groin.  The right groin scar was mostly closed down but there are a few punctate openings with yellow caseating thinly purulent cheesy material.  Also in the right inguinal/thigh crease.  The right proximal thigh scar was well-healed proximally.  I made a large bio concave oblique incision to excise most of the old scars and excised any subcutaneous nodularity and old scars down to the inguinal floor and scar tissue.  There was some laxity in the inguinal floor after sharp dissection and cautery.  I primarily closed it with 0 Vicryl in a running fashion to good result.  I dissected until there was nice soft normal subcutaneous tissues.  Resulting 10 x 5 cm wound had good granulation.  It was carefully packed.   The patient was extubated & arrived in the PACU in stable condition..  I had discussed postoperative care with the patient in the holding area.  Instructions are written in the chart.  I discussed operative findings, updated the patient's status, discussed probable steps to recovery, and gave postoperative recommendations to the patient's spouse.  Recommendations were made.  Questions were answered.  He expressed understanding & appreciation.     Catherine [name], M.D., F.A.C.S. Gastrointestinal and Minimally Invasive Surgery [facility] [address] [place], [place] [address] [phone] Main / Paging   02/28/2019 9:50 PM   12/09/2018   12:04 PM   PATIENT:  [name]  74 y.o. female   Patient Care Team: Jonathon Jordan, MD as PCP - General (Family Medicine) Felecia Shelling, Nanine Means, MD as Consulting Physician (Neurology) Michael Boston, MD as Consulting Physician (General Surgery) Ronald Lobo, MD as Consulting Physician (Gastroenterology) Tommy Medal, Lavell Islam, MD as Consulting Physician (Infectious Diseases) Jackelyn Knife, MD as  Rounding Team (Internal Medicine)   PRE-OPERATIVE DIAGNOSIS:  RIGHT GROIN & THIGH ABSCESSES   POST-OPERATIVE DIAGNOSIS:  RIGHT GROIN & THIGH ABSCESSES     PROCEDURE: INCISION AND DRAINAGE RIGHT GROIN AND THIGH ABSCESSES DEBRIDEMENT   SURGEON:  Catherine Hector, MD  ASSISTANT: OR Staff   ANESTHESIA:   local and general   EBL:  Total I/O In: 916 [I.V.:913] Out: 20 [Blood:20]   Delay start of Pharmacological VTE agent (>24hrs) due to surgical blood loss or risk of bleeding:  no   DRAINS: none   SPECIMEN: Right groin and thigh skin, dermis, subcutaneous tissue, fascia for culture and pathology   DISPOSITION OF SPECIMEN:  MICROBIOLOGY & PATHOLOGY   COUNTS:  YES   PLAN OF CARE: Admit to inpatient   PATIENT DISPOSITION:  PACU - hemodynamically stable.   INDICATION: Former smoking female with right groin masses requiring numerous operations.  S/p excision of cyst in right groin 01/2016.  Pathology c/w lymphagioma & benign LN s/p lap TEP excision of inner pelvic mass, bilateral femoral hernia repair, left inguinal hernia repair 01/2018.  Pathology c/w synovial cyst s/p excision proximal thigh mass 06/2018.  Pathology c/w syniovial cyst & benign LN Incision and drainage of small seroma right lower groin incision 09/20/2018 Right groin and inner thigh exploration with excision of subcutaneous and lymph node 11/02/2018   Development of chronic wound infection.  Growing atypical Mycobacterium species.  Followed by infectious disease.  Progression of inflammation despite antibiotic regimen.  Admitted for worsening cellulitis and pain.  Recommendation made for operative incision and drainage of abscesses and debridement of chronically inflamed infected tissue The anatomy and physiology of skin abscesses was discussed. Pathophysiology of SQ abscess, possible progression to fasciitis & sepsis, etc discussed . I stressed good hygiene & wound care. Possible redebridement was discussed as well.    Possibility of recurrence was discussed. Risks, benefits, alternatives were discussed. I noted a good likelihood this will help address the problem. Risks of anesthesia and other risks discussed. Questions answered. The patient is does wish to proceed.   OR FINDINGS: Swollen indurated right groin and inner thigh incisions with cellulitis and scant purulent drainage.  Some tissue necrosis/infection.       Incision done in right groin and right thigh through skin, dermis, subcutaneous tissues, down to level of fascia.  Excision with surgical scalpel and cautery.   Right groin wound 5 x 2 cm.  Goes 2 cm deep.   Right thigh wound 7 x 3 cm with undermining.  Goes 3 cm deep     DESCRIPTION:   Informed consent was confirmed. The patient remained on oral linezolid & clofazimine per infectious disease. The patient underwent general anesthesia without any difficulty. The patient was positioned supine. SCDs were active during the entire case. The area around the abscess was prepped and draped in a sterile fashion. A surgical timeout confirmed our plan.   I made an incision over the most fluctuant area of the right groin and right thigh inflamed former incisions.   Purulent material expressed.  Sent for aerobic and anaerobic culture.  I placed my finger into the abscess cavity to break up loculations.  The incision was extended to adequately expose the entire cavity.  I extended the wound to expose the chronically inflamed necrotic and infected tissue.  I used scalpel and scissors to excise necrotic and infected tissue down to the level of the anterior abdominal wall fascia on the right groin and the anterior inner fascia of the right thigh.  We did irrigation. The fascia was viable.  Excess skin excised to have nice open wounds.  Right thigh wound undermined it cephalad for the central area of swelling not associated with incision.  We took extra care to ensure hemostasis.  Tissue was sent for culture and  pathology.  We called and discussed with microbiology to make sure that appropriate specimens were obtained and handled   The wound was packed with saline moistened gauze.  Sterile dressings applied.  Patient is being extubated go to recovery room.   Plan is to continue antibiotic regimen.  I did do a field block of local anesthetic.  We will begin dressing care tomorrow.  Hopefully go home tomorrow.  Per the patient's request, I did not call her spouse or any other family member.   Catherine [name], M.D., F.A.C.S. Gastrointestinal and Minimally Invasive Surgery [facility] [address] [place], [place] [address] [phone] Main / Paging         Pathology: Diagnosis Synovium, cyst, right groin - BENIGN CYST, COMPATIBLE WITH CLINICALLY STATED SYNOVIAL CYST. - BENIGN LYMPH NODE. Jaquita Folds MD Pathologist, Electronic Signature (Case signed 06/18/2018) Specimen Kealy Lewter and Clinical Information Specimen(s) Obtained: Synovium, cyst, right groin Specimen Clinical Information right groin subcutaneous mass, 3 cm x 2 cm (kp) Kiyan Burmester Received in formalin are multiple irregular portions of fibroadipose tissue measuring 4.5 x 4.5 x 1.5 cm in aggregate. There is clear mucinous fluid present on the surface of the tissue. An intact cyst is not grossly identified. The cut surface shows an ill-defined firm tan-white nodular area measuring up to 1.8 cm. Sections are submitted in three cassettes. (GP:ah 06/17/18) Report signed out from the following location(s) Technical Component was performed at [facility] [place], [place], [place] [address]. CLIA #: [id number], Interpretation was performed at [facility], [place], [place] [address]. CLIA #: [id number],   Marland Kitchen 06/16/2018   5:37 PM   PATIENT:  [name]  75 y.o. female   Patient Care Team: Jonathon Jordan, MD as PCP - General (Family  Medicine) Felecia Shelling, Nanine Means, MD as Consulting Physician (Neurology) Michael Boston, MD as Consulting Physician (General Surgery) Ronald Lobo, MD as Consulting Physician (Gastroenterology)   PRE-OPERATIVE DIAGNOSIS:  Right groin subcutaneous mass, 3 cm x 2 cm   POST-OPERATIVE DIAGNOSIS:  Right groin subcutaneous masses, largest 4 cm x 3 x 3 cm   PROCEDURE:  REMOVAL OF RIGHT GROIN / THIGH MASSES   SURGEON:  Catherine Hector, MD   ASSISTANT: RN   ANESTHESIA:   Local anesthetic and field block in the subcutaneous tissues.  No deep local anesthetic General   I&O Total I/O In: 965 [I.V.:915; IV Piggyback:50] Out: 10 [Blood:10]   Delay start of Pharmacological VTE agent (>24hrs) due to surgical blood loss or risk of bleeding:  NO   DRAINS: none   SPECIMEN:  No Specimen   DISPOSITION OF SPECIMEN:  N/A   COUNTS:  YES   PLAN OF CARE: Discharge to home after PACU   PATIENT DISPOSITION:  PACU - hemodynamically stable.   INDICATION: Pleasant woman was right inguinal mass excised consistent with lymphocele.  Recurrent pain and swelling and found to have a femoral hernia and femoral canal cystic mass.  Excised.  Consistent with a synovial cyst.  Femoral hernia repaired.  Initially did well.  Then felt new swelling in her inner thigh away from her prior areas.  CT scan showed fluid collection.  Aspirated.  Thick fluid concerning for recurrent synovial cyst.  Mass recurred.  Patient requested for groin exploration with possible excision.   The pathophysiology of skin & subcutaneous masses was discussed.  Natural history risks without  surgery were discussed.  I recommended surgery to remove the mass.  I explained the technique of removal with use of local anesthesia & possible need for more aggressive sedation/anesthesia for patient comfort.     Risks such as bleeding, infection, wound breakdown, heart attack, death, and other risks were discussed.  I noted a good likelihood this will help  address the problem.   Possibility that this will not correct all symptoms was explained. Possibility of regrowth/recurrence of the mass was discussed.  We will work to minimize complications. Questions were answered.  The patient expresses understanding & wishes to proceed with surgery.   OR FINDINGS: Simple cystic mass 4 x 3 x 3 cm on proximal inner right thigh consistent with recurrent synovial cyst.  Smaller firm nodules in a cluster heading towards the femoral foramen concerning for a small synovial cyst as well.  Excised.   Blake drain left in place   DESCRIPTION:   The patient was identified & brought into the operating room. The patient was positioned supine with arms tucked. SCDs were active during the entire case. The patient underwent general anesthesia without any difficulty.  The abdomen was prepped and draped in a sterile fashion. A Surgical Timeout confirmed our plan.   Made an oblique incision in the medial right groin just above the crease between the thigh region.  Came to the subcutaneous tissues and the deep subcutaneous tissues going towards the right inner thigh was a cystic mass.  Able to come around it.  Was decompressed and had thick tan syrupy brown fluid within it.  Concerning for recurrent synovial cyst.  Skeletonized the more distal aspects on the proximal thigh and placed medium-size clips on it.  Elevate the mass off the inner thigh muscular compartment and excised the larger mass.  I could feel some small PE and small marble sized cystic masses with fibrosis heading towards the femoral ring underneath the inguinal ligament.  Skeletonized this region carefully excised it.  Placed clips just at the femoral foramen.  Took care to avoid any vascular or nerve injury.  Hemostasis was good.   I did copious irrigation over a liter and reinspection.  Remaining tissues were soft with no evidence of any persistent cystic masses or other areas of concern.  Most compartments intact.   Hemostasis good.  No evidence of any neurovascular injury.  Resulting 10 x 5 x 4 cm wound.  Side to place a 15 [place] Blake drain from the right lower quadrant through the subcutaneous tissues into this inguinal/proximal thigh pocket.  The skin with 2-0 Prolene suture.  Closed with 3-0 Vicryl interrupted sutures at Scarpa's and 4 Monocryl suture at the deep dermal subcuticular running fashion.  Dermabond placed.   Patient extubated in recovery room.  Talkative.  Has some soreness but moving extremities normally.  I discussed operative findings, updated the patient's status, discussed probable steps to recovery, and gave postoperative recommendations to the patient's spouse.  Recommendations were made.  Questions were answered.  He expressed understanding & appreciation.  I had discussed postoperative care with the patient in the holding area.     Catherine [name], M.D., F.A.C.S. Gastrointestinal and Minimally Invasive Surgery [facility] [address], [place], [place] [address] [phone] Main / Paging     Problem List/Past Medical Catherine Hector, MD; 11/12/2020 1:41 PM) ENLARGED LYMPH NODE (R59.9)  CHRONIC CONSTIPATION (K59.09)  EXTERNAL HEMORRHOIDS WITH COMPLICATION (L27.5)  TOBACCO ABUSE (Z72.0)  POSTOPERATIVE SEROMA  OF SKIN AFTER NON-DERMATOLOGIC PROCEDURE (L76.34)  LYMPHANGIOMYOMA (D18.1)  VISIT FOR WOUND CHECK (Z51.89)  CHANGE OR REMOVAL OF DRAINS (Z48.03)  GROIN PAIN, CHRONIC, RIGHT (R10.31)  RIGHT INGUINAL HERNIA (K40.90)  PREOP - ING HERNIA - ENCOUNTER FOR PREOPERATIVE EXAMINATION FOR GENERAL SURGICAL PROCEDURE (Z01.818)  OTHER GANGLION AND CYST OF SYNOVIUM, TENDON, AND BURSA (M67.49, M67.89,M71.39)  BILATERAL FEMORAL HERNIA WITHOUT OBSTRUCTION OR GANGRENE, RECURRENCE NOT SPECIFIED (K41.20)  MASS OF THIGH, RIGHT (R22.41)  ACID-FAST BACTERIA PRESENT (R89.9)  MYCOBACTERIUM FORTUITUM INFECTION (A31.8)  PROPIONIBACTERIUM INFECTION (A49.8)   ABDOMINAL WALL MASS OF RIGHT LOWER QUADRANT (R19.03)  RIGHT KNEE PAIN, UNSPECIFIED CHRONICITY (M25.561)    Allergies Janeann Forehand, CNA; 11/12/2020 1:38 PM) Penicillin G Benzathine & Proc *PENICILLINS*  Rash Sulfabenzamide *CHEMICALS*  rash Cipro *FLUOROQUINOLONES*  Allergies Reconciled    Medication History Janeann Forehand, CNA; 11/12/2020 1:39 PM) Judithann Sauger Aerosphere  (160-9-4.8MCG/ACT Aerosol, Inhalation) Active. Gabapentin  (100MG  Capsule, 1 (one) Oral three times daily, as needed, Taken starting 07/04/2019) Active. (Start 100mg  twice a day - can increase gradually to 300mg  3x/day) SUMAtriptan Succinate  (50MG  Tablet, Oral) Active. traMADol HCl  (50MG  Tablet, Oral) Active. Prolia  (60MG /ML Soln Pref Syr, Subcutaneous) Active. Calcium Carbonate  (Oral) Specific strength unknown - Active. Vitamin B-12  (5000MCG Tablet Disint, Oral) Active. Cetirizine HCl  (5MG  Tablet, Oral) Active. Flonase  (50MCG/ACT Suspension, Nasal) Active. Ibuprofen  (Oral) Specific strength unknown - Active. LORazepam  (1MG  Tablet, Oral) Active. Symbicort  (160-4.5MCG/ACT Aerosol, Inhalation) Active. Albuterol Sulfate  ((2.5 MG/3ML)0.083% Nebulized Soln, Inhalation) Active. Sucralfate  (1GM Tablet, Oral) Active. NexIUM  (40MG  Capsule DR, Oral) Active. Vitamin D (Cholecalciferol)  (1000UNIT Capsule, Oral) Active. Medications Reconciled        Vitals (Donyelle Alston CNA; 11/12/2020 1:40 PM) 11/12/2020 1:39 PM Weight: 164.13 lb   Height: 70 in  Body Surface Area: 1.92 m   Body Mass Index: 23.55 kg/m   Temp.: 97.7 F    Pulse: 96 (Regular)    P.OX: 98% (Room air) BP: 140/96(Sitting, Left Arm, Standard)      01/24/2021 Wt 73.5 kg   BMI 23.24 kg/m           Physical Exam Catherine Hector MD; 11/12/2020 2:16 PM)   General Mental Status - Alert. General Appearance - Not in acute distress. Voice - Normal. Note:  Walking normally without any difficulty getting up and down the bed. Normal gait.    Integumentary Global Assessment Upon inspection and palpation of skin surfaces of the - Distribution of scalp and body hair is normal. General Characteristics Overall examination of the patient's skin reveals - no rashes and no suspicious lesions.   Head and Neck Head - normocephalic, atraumatic with no lesions or palpable masses. Face Global Assessment - atraumatic, no absence of expression. Neck Global Assessment - no abnormal movements, no decreased range of motion. Trachea - midline. Thyroid Gland Characteristics - non-tender.   Eye Eyeball - Left - Extraocular movements intact, No Nystagmus - Left. Eyeball - Right - Extraocular movements intact, No Nystagmus - Right. Upper Eyelid - Left - No Cyanotic - Left. Upper Eyelid - Right - No Cyanotic - Right. Note:  Wears glasses.  Vision corrected   Chest and Lung Exam Inspection Accessory muscles - No use of accessory muscles in breathing.   Abdomen Note:  Thin abdominal wall with right lower quadrant asymmetric bulging. I think I feel a spigelian type hernia between her old right subcostal open cholecystectomy incision and near one of her RLQ old  port sites. It is sensitive there. A little more prominent when she stands. Suspicious for incisional or spigelian type hernia.   Female Genitourinary Note:  Right suprapubic groin and thigh incisions all closed.  Minimal fullness in inguinal and right femoral/inner thigh region. No wounds or drainage or cellulitis. Only mildly sensitive to touch. No obvious recurrent inguinal or femoral hernia   Peripheral Vascular Upper Extremity Inspection - Left - Not Gangrenous, No Petechiae. Inspection - Right - Not Gangrenous, No Petechiae.   Neurologic Neurologic evaluation reveals  - normal attention span and ability to concentrate, able to name objects and repeat phrases. Appropriate fund of knowledge and normal coordination.   Neuropsychiatric Mental status exam performed with findings  of - able to articulate well with normal speech/language, rate, volume and coherence and no evidence of hallucinations, delusions, obsessions or homicidal/suicidal ideation. Orientation - oriented X3. Note:  Somewhat anxious. Interrupts. Somewhat consolable.   Musculoskeletal Global Assessment Gait and Station - normal gait and station. Note:  She was concerned about some swelling in her right lower extremity.  I cannot see any significant difference in her thighs Delane Ginger /legs  no fluctuance.   Lymphatic General Lymphatics Description - No Generalized lymphadenopathy.       Assessment & Plan   ABDOMINAL PAIN, RIGHT LOWER QUADRANT (R10.31) Impression: Pain in right lower quadrant with increasing asymmetry and some subcutaneous mass suspicious for reducible hernia. Between her old right subcostal incision and right lower quadrant port site. That part of her abdominal wall is rather thin on CT scan with no classic Spigelian hernia but more obvious when she stands.   Because of the newer onset in January with increasing size and clear asymmetry now, I think she may have a hernia that explains some of her discomfort. His challenge in a patient with IBS constipation issues now on 5 different fiber and other laxatives supplements to get under control. She claims that is working better under Dr. Thayer Headings guidance.   I offered diagnostic laparoscopy lysis adhesions and possible mesh repair. She's been infection free for over a year. For some reason she wants [facility] only. We'll try and make that happen.   Opportunity to try and do a triple neurectomy given her chronic groin pain. She is a little guarded on that but is open to the idea since she still struggles with the chronic groin pain. She feels like she can live without just not the abdominal pain.   I strongly suspect that anxiety and almost obsessive-compulsive tendencies or exacerbating her symptoms. Recommend she consider trial of an SSRI  such as fluoxetine. I'll recheck her primary care physician to see if that is an option. Some people with chronic pain finding benefit with that lowering the intensity and frequency of her symptoms since she feels like its constant with intermittent flares.   Try to give her time to address her concerns and needs. I think she is willing to proceed somewhat guardedly.   Current Plans Pt Education - [facility] Good Bowel Health (Jules Vidovich)   GROIN PAIN, CHRONIC, RIGHT (R10.31) Impression: Pain related to chronic inflammation from her multiple groin incisions and chronic infection.   Her incision and groin looks the best that seen in a long while.   She claims she still has pain but can work with it. Her physical activity level seems completely normal to me. I could consider a triple neurectomy to see if that would help all that I could cause some numbing and other issues.  She is terrified about any potential thigh weakness or Dr. issues. She is against any nerve blocks. However she is open to considering the triple neurectomies if he could help with her chronic pain while I am there   If she still struggles after surgery can revisit the idea of a pain specialist. She seemed more interested at this time around.   Current Plans Return to clinic as needed.    Soreness, decreased appetite, and poor energy level are common problems after surgery.  While many people can struggle with a bad day, these concerns should gradually fade away or at least improve.  Much of your recovery depends on your health & the severity of your operation.  Please call if you have any further questions / concerns related to surgery.   Increase activity as tolerated to regular everyday activity.  Consider daily low impact exercise every day such as walking an hour a day.    Do not push through pain.  If it hurts to do it, then don't do it.   Diet as tolerated.  Low fat high fiber diet ideal.   30 g fiber a day ideal.  Consider  taking a daily fiber supplement to keep your bowels regular.   Followup with your primary care physician for other health issues as would normally be done.    Consider screening for malignancies (breast, prostate, colon, melanoma, etc) as appropriate.  Discuss with you primary care physician.   Pt Education - [facility] Pain Control (Jaelan Rasheed)   PREOP - [place] - ENCOUNTER FOR PREOPERATIVE EXAMINATION FOR GENERAL SURGICAL PROCEDURE (Z01.818)   Current Plans You are being scheduled for surgery - Our schedulers will call you.    You should hear from our office's scheduling department within 5 working days about the location, date, and time of surgery.  We try to make accommodations for patient's preferences in scheduling surgery, but sometimes the OR schedule or the surgeon's schedule prevents [place] from making those accommodations.   If you have not heard from our office [phone]) in 5 working days, call the office and ask for your surgeon's nurse.   If you have other questions about your diagnosis, plan, or surgery, call the office and ask for your surgeon's nurse.   Written instructions provided [facility] Consent - Hernia Repair - Ventral/Incisional/Umbilical (Gilverto Dileonardo): discussed with patient and provided information. Pt Education - [facility] Hernia Post-Op HCI (Trendon Zaring): discussed with patient and provided information. Pt Education - [facility] Pain Control (Johnsie Moscoso) Pt Education - Pamphlet Given - Laparoscopic Hernia Repair: discussed with patient and provided information. Pt Education - [facility] Mesh education: discussed with patient and provided information.   IRRITABLE BOWEL SYNDROME WITH CONSTIPATION (K58.1)   Current Plans Pt Education - [facility] Good Bowel Health (Maryah Marinaro) Pt Education - [facility] IBS patient info: discussed with patient and provided information.   ANXIETY AND DEPRESSION (F41.9) Impression: Her anxiety and depression seen to be worse.   I recommend she consider an SSRI (Zoloft, etc) to help since I think she  has some obsessive-compulsive tendencies with her chronic pain. That can help calm things down. Good help with some anxiety & depression. She was initially skeptical and then open to it. She claims someone gave her Lexapro and she didn't want to take it thought it needed to be monitored. We'll see if her primary care physician agrees.   I did recommend that the patient needs to stay on it for 3 months at the very least for time for it to  kick in.   Another option is to consider work with the therapist but I think she will be very resistant to that.   Catherine Hector, MD, FACS, MASCRS   Esophageal, Gastrointestinal & Colorectal Surgery Robotic and Minimally Invasive Surgery [facility] [address] [place], [place] [address] [phone] Fax [phone] Main/Paging   CONTACT INFORMATION: Weekday (9AM-5PM) concerns: Call [facility] main office at [phone] Weeknight (5PM-9AM) or Weekend/Holiday concerns: Check [email or web address] for General Surgery [facility] coverage (Please, do not use SecureChat as it is not reliable communication to operating surgeons for immediate patient care)

## 2021-01-24 NOTE — Interval H&P Note (Signed)
History and Physical Interval Note:  01/24/2021 11:10 AM  Catherine [name]  has presented today for surgery, with the diagnosis of [place].  The various methods of treatment have been discussed with the patient and family. After consideration of risks, benefits and other options for treatment, the patient has consented to  Procedure(s): LAPAROSCOPY DIAGNOSTIC (N/A) PROBABLE VENTRAL INCISIONAL ABDOMINAL WALL HERNIA REPAIR POSSIBLE NEURECTOMY (N/A) as a surgical intervention.  The patient's history has been reviewed, patient examined, no change in status, stable for surgery.  I have reviewed the patient's chart and labs.  Questions were answered to the patient's satisfaction.    I have re-reviewed the the patient's records, history, medications, and allergies.  I have re-examined the patient.  I again discussed intraoperative plans and goals of post-operative recovery.  The patient agrees to proceed.  BRIELL PAULETTE  [birth year] [id number]  Patient Care Team: Jonathon Jordan, MD as PCP - General (Family Medicine) Felecia Shelling, Nanine Means, MD as Consulting Physician (Neurology) Michael Boston, MD as Consulting Physician (General Surgery) Ronald Lobo, MD as Consulting Physician (Gastroenterology) Tommy Medal, Lavell Islam, MD as Consulting Physician (Infectious Diseases) Jackelyn Knife, MD as Rounding Team (Internal Medicine) Laurin Coder, MD as Consulting Physician (Infectious Diseases) Melrose Nakayama, MD as Consulting Physician (Orthopedic Surgery) Warren Danes, PA-C as Physician Assistant (Dermatology)  Patient Active Problem List   Diagnosis Date Noted   Soft tissue infection - Mycobaterium fortuitum   12/08/2018    Priority: High   Depression with anxiety 11/04/2016    Priority: High   Abdominal pain 06/06/2019   Seroma, post-traumatic ([place]) 04/27/2019   Medication monitoring encounter 03/31/2019   Abscess of groin, right  02/28/2019   Infected hernioplasty mesh ([place]) 01/26/2019   Dizziness 01/17/2019   DOE (dyspnea on exertion) 01/17/2019   Mycobacterium fortuitum infection    Thigh abscess    Aortic aneurysm ([place]) 12/08/2018   Cellulitis of right groin 12/08/2018   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   Groin abscess 06/16/2018   Anxiety    Urgency of urination    DDD (degenerative disc disease), lumbar    Osteopenia 08/04/2017   Right sided sciatica 02/18/2017   Piriformis muscle pain 02/18/2017   Right groin pain 11/04/2016   Neuropathy 11/04/2016   Numbness 11/04/2016   Dysfunction of right eustachian tube 10/30/2015   Presbycusis of both ears 10/30/2015   Sensory hearing loss, bilateral 10/30/2015   Subjective tinnitus of both ears 10/30/2015   Atypical chest pain 05/08/2014    Past Medical History:  Diagnosis Date   Abdominal pain 06/06/2019   Anxiety    Aortic aneurysm ([place]) 12/08/2018   Arthritis    HANDS   BCC (basal cell carcinoma) 04/07/1995   right jawline (CX3, 5FU)   BCC (basal cell carcinoma) 05/19/2013   right scalp/post (treatment biopsy)   Chronic constipation    Complication of anesthesia    upon waking up from anesthesia patient experienced chest pain - EKG done was negative, patient states "it may have been indigestion"   COPD (chronic obstructive pulmonary disease) ([place])    DDD (degenerative disc disease), lumbar    L2-3/3-4/4-5   Dizziness 01/17/2019   DOE (dyspnea on exertion) 01/17/2019   Dysplastic nevus 12/04/2009   mod-severe left forearm, exc   Dysuria    Frequency of urination    Ganglion cyst of right groin 11/23/2018   GERD (gastroesophageal reflux disease)    Hearing aid worn    Infected  hernioplasty mesh ([place]) 01/26/2019   Keratosis, actinic 02/18/1991   left abdomen   Migraine    Mild emphysema ([place])    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 HUMPHRIES/GRAPEY   Wears glasses     Past Surgical History:  Procedure Laterality Date   ABDOMINAL HYSTERECTOMY  1982   W/  UNILATERAL SALPINGOOPHORECTOMY   bilateral hernia surgery      removed ganglion cyst and swollen lymph node    CHOLECYSTECTOMY OPEN  1987   W/  APPENDECTOMY   COLONOSCOPY  2002  &  12-2012 & 03/19/15   CYSTO WITH HYDRODISTENSION N/A 02/20/2014   Procedure: [place] AND MARCAINE INSTILLATION;  Surgeon: Bernestine Amass, MD;  Location: [facility];  Service: Urology;  Laterality: N/A;   EXCISION MASS LOWER EXTREMETIES Right 11/02/2018   Procedure: REMOVAL OF RIGHT GROIN AND THIGH SUBCUTANEOUS MASSES.;  Surgeon: Michael Boston, MD;  Location: [facility];  Service: General;  Laterality: Right;   EYE SURGERY     bilateral cataract surgery    GROIN DISSECTION N/A 02/28/2019   Procedure: GROIN EXPLORATION AND DEBRIDEMENT;  Surgeon: Michael Boston, MD;  Location: [facility];  Service: General;  Laterality: N/A;   HERNIA REPAIR     INCISION AND DRAINAGE ABSCESS N/A 12/09/2018   Procedure: INCISION AND DRAINAGE RIGHT GROIN and thigh  WITH DEBRIDEMENT;  Surgeon: Michael Boston, MD;  Location: [facility];  Service: General;  Laterality: N/A;   IR CATHETER TUBE CHANGE  05/05/2019   IR RADIOLOGIST EVAL & MGMT  05/03/2019   IR RADIOLOGIST EVAL & MGMT  05/10/2019   KNEE ARTHROSCOPY W/ MENISCAL REPAIR Right 2021   LAPAROSCOPIC ABDOMINAL EXPLORATION Right 02/28/2019   Procedure: LAPAROSCOPIC EXPLORATION WITH REMOVAL OF RIGHT PREPERITONEAL MESH;  Surgeon: Michael Boston, MD;  Location: [facility];  Service: General;  Laterality: Right;   LAPAROSCOPIC LYSIS OF ADHESIONS Bilateral 02/28/2019   Procedure: LAPAROSCOPIC LYSIS OF ADHESIONS;  Surgeon: Michael Boston, MD;   Location: [facility];  Service: General;  Laterality: Bilateral;   LAPAROSCOPIC UNILATERAL SALPINGOOPHORECTOMY  1984   LESION REMOVAL Right 02/28/2019   Procedure: EXCISION VAGINAL ABSCESS  AND DISSECTION;  Surgeon: Michael Boston, MD;  Location: [facility];  Service: General;  Laterality: Right;   MASS EXCISION Right 06/16/2018   Procedure: REMOVAL OF RIGHT GROIN SUBCUTANEOUS MASS ERAS PATHWAY;  Surgeon: Michael Boston, MD;  Location: [facility];  Service: General;  Laterality: Right;   removal of lymph node cyst in right groin      ROTATOR CUFF REPAIR Right 2003   and BONE SPUR    Social History   Socioeconomic History   Marital status: Married    Spouse name: Not on file   Number of children: Not on file   Years of education: Not on file   Highest education level: Not on file  Occupational History   Not on file  Tobacco Use   Smoking status: Former    Packs/day: 0.75    Years: 48.00    Pack years: 36.00    Types: Cigarettes    Quit date: 09/04/2017    Years since quitting: 3.3  Smokeless tobacco: Never  Vaping Use   Vaping Use: Never used  Substance and Sexual Activity   Alcohol use: No   Drug use: No   Sexual activity: Not on file  Other Topics Concern   Not on file  Social History Narrative   Not on file   Social Determinants of Health   Financial Resource Strain: Not on file  Food Insecurity: Not on file  Transportation Needs: Not on file  Physical Activity: Not on file  Stress: Not on file  Social Connections: Not on file  Intimate Partner Violence: Not on file    Family History  Problem Relation Age of Onset   Esophageal cancer Mother 69   Lung cancer Father 16    Medications Prior to Admission  Medication Sig Dispense Refill Last Dose   acetaminophen (TYLENOL) 500 MG tablet Take 1,000 mg by mouth at bedtime.   01/23/2021   BREZTRI AEROSPHERE 160-9-4.8 MCG/ACT AERO Inhale 2 puffs into the lungs 2 (two) times daily.   01/24/2021   CALCIUM CARBONATE ANTACID PO Take  1,000 mg by mouth daily.    01/23/2021   carboxymethylcellulose (REFRESH PLUS) 0.5 % SOLN Place 2 drops into both eyes 3 (three) times daily.    01/24/2021   Cholecalciferol (VITAMIN D3 PO) Take 6,000 Units by mouth daily.   01/23/2021   Cyanocobalamin (VITAMIN B-12 PO) Take 2,500 mcg by mouth daily.   01/23/2021   docusate sodium (COLACE) 100 MG capsule Take 200 mg by mouth daily.    01/23/2021   esomeprazole (NEXIUM) 20 MG capsule Take 20 mg by mouth daily.   01/24/2021   gabapentin (NEURONTIN) 100 MG capsule Take 100 mg by mouth 3 (three) times daily.   01/23/2021   LORazepam (ATIVAN) 1 MG tablet Take 0.5-1 mg by mouth See admin instructions. Take 0.5 mg in the morning and 1.0 mg at bedtime   01/23/2021   polyethylene glycol (MIRALAX / GLYCOLAX) packet Take 17 g by mouth daily.    01/23/2021   senna (SENOKOT) 8.6 MG TABS tablet Take 4 tablets by mouth at bedtime.   01/23/2021   SUMAtriptan (IMITREX) 50 MG tablet Take 50 mg by mouth every 2 (two) hours as needed for migraine. May repeat in 2 hours if headache persists or recurs.   Past Week   albuterol (PROVENTIL HFA;VENTOLIN HFA) 108 (90 Base) MCG/ACT inhaler Inhale 2 puffs into the lungs every 4 (four) hours as needed for wheezing or shortness of breath.   More than a month   COVID-19 mRNA vaccine, Moderna, 100 MCG/0.5ML injection INJECT AS DIRECTED .25 mL 0    COVID-19 mRNA vaccine, Moderna, 100 MCG/0.5ML injection Inject into the muscle. 0.25 mL 0    denosumab (PROLIA) 60 MG/ML SOSY injection Inject 60 mg into the skin every 6 (six) months.   More than a month   traMADol (ULTRAM) 50 MG tablet Take 50 mg by mouth every 6 (six) hours as needed for moderate pain.   More than a month    Current Facility-Administered Medications  Medication Dose Route Frequency Provider Last Rate Last Admin   bupivacaine liposome (EXPAREL) 1.3 % injection 266 mg  20 mL Infiltration Once Michael Boston, MD       Chlorhexidine Gluconate Cloth 2 % PADS 6 each  6 each Topical  Once Jozelynn Danielson, Remo Lipps, MD       clindamycin (CLEOCIN) IVPB 900 mg  900 mg Intravenous On Call to OR Michael Boston, MD  And   gentamicin (GARAMYCIN) 370 mg in dextrose 5 % 100 mL IVPB  5 mg/kg Intravenous On Call to OR Michael Boston, MD       feeding supplement (ENSURE PRE-SURGERY) liquid 296 mL  296 mL Oral Once Michael Boston, MD       feeding supplement (ENSURE PRE-SURGERY) liquid 592 mL  592 mL Oral Once Michael Boston, MD       lactated ringers infusion   Intravenous Continuous Duane Boston, MD 10 mL/hr at 01/24/21 1053 New Bag at 01/24/21 1053     Allergies  Allergen Reactions   Ciprofloxacin Hcl Other (See Comments)    Aortic aneurysm per MD   Metoprolol Other (See Comments) and Cough    and wheezing   Penicillins Shortness Of Breath, Rash and Other (See Comments)    Has patient had a PCN reaction causing immediate rash, facial/tongue/throat swelling, SOB or lightheadedness with hypotension: Yes Has patient had a PCN reaction causing severe rash involving mucus membranes or skin necrosis: No Has patient had a PCN reaction that required hospitalization: Yes - MD office Has patient had a PCN reaction occurring within the last 10 years: No If all of the above answers are "NO", then may proceed with Cephalosporin use.    Oxycodone Other (See Comments)    Nightmares.  Tolerates tramadol   Fosamax [Alendronate]     Other reaction(s): GERD   Advair Diskus [Fluticasone-Salmeterol] Anxiety    Wt 73.5 kg   BMI 23.24 kg/m   Labs: No results found for this or any previous visit (from the past 48 hour(s)).  Imaging / Studies: No results found.   [name], M.D., F.A.C.S. Gastrointestinal and Minimally Invasive Surgery [facility] [address], [place], [place] [phone] Main / Paging  01/24/2021 11:10 AM    [name]

## 2021-01-24 NOTE — Anesthesia Procedure Notes (Signed)
Procedure Name: Intubation Date/Time: 01/24/2021 12:21 PM Performed by: Lollie Sails, CRNA Pre-anesthesia Checklist: Patient identified, Emergency Drugs available, Suction available, Patient being monitored and Timeout performed Patient Re-evaluated:Patient Re-evaluated prior to induction Oxygen Delivery Method: Circle system utilized Preoxygenation: Pre-oxygenation with 100% oxygen Induction Type: IV induction Ventilation: Mask ventilation without difficulty Laryngoscope Size: Miller and 3 Grade View: Grade I Tube type: Oral Tube size: 7.0 mm Number of attempts: 1 Airway Equipment and Method: Stylet Placement Confirmation: ETT inserted through vocal cords under direct vision, positive ETCO2 and breath sounds checked- equal and bilateral Secured at: 23 cm Tube secured with: Tape Dental Injury: Teeth and Oropharynx as per pre-operative assessment

## 2021-01-24 NOTE — Anesthesia Preprocedure Evaluation (Addendum)
Anesthesia Evaluation  Patient identified by MRN, date of birth, ID band Patient awake    Reviewed: Allergy & Precautions, NPO status , Patient's Chart, lab work & pertinent test results  Airway Mallampati: II  TM Distance: >3 FB Neck ROM: Full    Dental  (+) Dental Advisory Given Upper bridge:   Pulmonary COPD,  COPD inhaler, former smoker,    Pulmonary exam normal breath sounds clear to auscultation       Cardiovascular + DOE  Normal cardiovascular exam Rhythm:Regular Rate:Normal  CT 2022 1. Atherosclerotic nonaneurysmal thoracic aorta. Maximum ascending thoracic aortic diameter 3.9 cm, top-normal. 2. Aortic Atherosclerosis (ICD10-I70.0) and Emphysema (ICD10-J43.9).   Neuro/Psych  Headaches, PSYCHIATRIC DISORDERS Anxiety Depression    GI/Hepatic Neg liver ROS, GERD  Controlled and Medicated,  Endo/Other  negative endocrine ROS  Renal/GU negative Renal ROS  negative genitourinary   Musculoskeletal  (+) Arthritis ,   Abdominal   Peds  Hematology negative hematology ROS (+)   Anesthesia Other Findings   Reproductive/Obstetrics                            Anesthesia Physical Anesthesia Plan  ASA: 3  Anesthesia Plan: General   Post-op Pain Management:    Induction: Intravenous  PONV Risk Score and Plan: 3 and Dexamethasone, Ondansetron and Treatment may vary due to age or medical condition  Airway Management Planned: Oral ETT  Additional Equipment:   Intra-op Plan:   Post-operative Plan: Extubation in OR  Informed Consent: I have reviewed the patients History and Physical, chart, labs and discussed the procedure including the risks, benefits and alternatives for the proposed anesthesia with the patient or authorized representative who has indicated his/her understanding and acceptance.     Dental advisory given  Plan Discussed with: CRNA  Anesthesia Plan Comments: (IV  pepcid for reflux ppx)       Anesthesia Quick Evaluation

## 2021-01-24 NOTE — Op Note (Addendum)
01/24/2021  PATIENT:  Catherine [name]  75 y.o. female  Patient Care Team: Jonathon Jordan, MD as PCP - General (Family Medicine) Felecia Shelling, Nanine Means, MD as Consulting Physician (Neurology) Michael Boston, MD as Consulting Physician (General Surgery) Ronald Lobo, MD as Consulting Physician (Gastroenterology) Tommy Medal, Lavell Islam, MD as Consulting Physician (Infectious Diseases) Jackelyn Knife, MD as Rounding Team (Internal Medicine) Lorenda Cahill, Arizona Constable, MD as Consulting Physician (Infectious Diseases) Melrose Nakayama, MD as Consulting Physician (Orthopedic Surgery) Warren Danes, PA-C as Physician Assistant (Dermatology)  PRE-OPERATIVE DIAGNOSIS:   VENTRAL INCISIONAL ABDOMINAL WALL HERNIA  CHRONIC RIGHT GROIN PAIN  POST-OPERATIVE DIAGNOSIS:   VENTRAL INCISIONAL ABDOMINAL WALL HERNIAS WITH EVENTRATION CHRONIC RIGHT GROIN PAIN RIGHT RECURRENT DIRECT SPACE HERNIA SKIN NEOPLASM OF UNCERTAIN BEHAVIOR  PROCEDURE:   LAPAROSCOPIC REPAIR OF  VENTRAL RECURRENT ABDOMINAL WALL HERNIA WITH MESH TAP BLOCK - BILATERAL DIAGNOSTIC LAPAROSCOPY GENITAL BRANCH OF GENITOFEMORAL NERVE NEURECTOMY  SURGEON:  [name], MD  ASSISTANT: Nurse   ANESTHESIA:     General  Regional TRANSVERSUS ABDOMINIS PLANE (TAP) nerve block for perioperative & postoperative pain control provided with liposomal bupivacaine (Experel) mixed with 0.25% bupivacaine as a Bilateral TAP block x 73mL each side at the level of the transverse abdominis & preperitoneal spaces along the flank at the anterior axillary line, from subcostal ridge to iliac crest under laparoscopic guidance   EBL:  Total I/O In: 159.3 [IV Piggyback:159.3] Out: 25 [Blood:25]  Per anesthesia record  Delay start of Pharmacological VTE agent (>24hrs) due to surgical blood loss or risk of bleeding:  no  DRAINS: none   SPECIMEN:  No Specimen  DISPOSITION OF SPECIMEN:  N/A  COUNTS:  YES  PLAN OF CARE: Discharge to home after  PACU  PATIENT DISPOSITION:  PACU - hemodynamically stable.  INDICATION: Pleasant patient with very complex history involving groin lymphangiomas and synovial cyst removals and hernias requiring excision, hernia repair, and then eventually mesh explantation for chronic mycobacterial infection 2017-2020:  s/p excision of cyst in right groin 01/2016.    Pathology c/w lymphagioma & benign LN s/p excision of inner pelvic mass, bilateral femoral hernia repair, left inguinal hernia repair 01/2018.  Pathology c/w synovial cyst s/p proximal thigh mass 06/2018.  Pathology c/w syniovial cyst & benign LN Incision and drainage of small seroma right lower groin incision 09/20/2018 Incision and drainage of right groin and thigh abscesses with debridement 12/09/2018 Laparoscopic preperitoneal dissection and explantation of right sided preperitoneal ultra pro polypropylene mesh.  Reexcision of groin wounds. 02/28/2019  It has been almost 2 years since her last surgery.  She has been infection free for quite some time.  Try to be function active.  Has had some eventration of the right flank and lower quadrant most likely consistent with ilioinguinal and iliohypogastric neurectomies at the time of mesh expectation on the right side.  However she noticed distinct bulging and examination with suspect just for a spigelian type right lower quadrant recurrent incisional hernia.  Also has had some intermittent right groin pain particularly to inner thigh suspicious along with genitofemoral pattern.  Offered possible neurectomy since she has poor tolerance to most pain medications.  The anatomy & physiology of the abdominal wall was discussed. The pathophysiology of hernias was discussed. Natural history risks without surgery including progeressive enlargement, pain, incarceration & strangulation was discussed. Contributors to complications such as smoking, obesity, diabetes, prior surgery, etc were discussed.  I feel the risks of  no intervention will lead to serious problems that  outweigh the operative risks; therefore, I recommended surgery to reduce and repair the hernia. I explained laparoscopic techniques with possible need for an open approach. I noted the probable use of mesh to patch and/or buttress the hernia repair.  Risks such as bleeding, infection, abscess, need for further treatment, heart attack, death, and other risks were discussed. I noted a good likelihood this will help address the problem. Goals of post-operative recovery were discussed as well. Possibility that this will not correct all symptoms was explained. I stressed the importance of low-impact activity, aggressive pain control, avoiding constipation, & not pushing through pain to minimize risk of post-operative chronic pain or injury. Possibility of reherniation especially with smoking, obesity, diabetes, immunosuppression, and other health conditions was discussed. We will work to minimize complications.   An educational handout further explaining the pathology & treatment options was given as well. Questions were answered. The patient expresses understanding & wishes to proceed with surgery.   OR FINDINGS: Moderate bowing and eventration of right lower quadrant most likely consistent with chronic denervation from prior partial neurectomies from right sided laparoscopic mesh explantation.  12 x 9 cm region of spigelian type partial herniation.  No obstruction.  Bulging and right direct space suspicious for right inguinal hernia.  No evidence of any obturator or recurrent femoral or direct inguinal hernias.  Very dense preperitoneal adhesions.  Type of repair: Laparoscopic underlay repair.     Placement of mesh:  Medially intraperitoneal but two thirds lateral and posterior in the retroperitoneal space  Name of mesh: Bard Ventralight dual sided (polypropylene / Seprafilm)  Size of mesh: 25x20cm  Orientation: Transverse  Mesh overlap:   5-7cm  Also 15 x 15 cm ultra Pro polypropylene mesh placed in right groin.  Atypical multi pigmented skin mass along lower right lateral chest wall.  11X37mm.  Probably atypical actinic keratosis.  Excised  DESCRIPTION:   Informed consent was confirmed. The patient underwent general anaesthesia without difficulty. The patient was positioned appropriately. VTE prevention in place. The patient's abdomen was clipped, prepped, & draped in a sterile fashion. Surgical timeout confirmed our plan.    I did a cutdown to try and get into the preperitoneal plane for a TEP like approach.  I was able to create enough working space to place a port in the left prepatellar region.  However encountered right of dense adhesions the preperitoneal space.  I switched over to an intraperitoneal tap like approach.  Placed ports into the peritoneal cavity.  I scored the peritoneum infraumbilically in a transverse fashion from midline to the right flank.  Rather dense thickened complains obliterated consistent with prior preperitoneal approach and mesh explantation.  Of note the UltraPro mesh on the left side was intact with no recurrent hernia.  There was some bulging suspicious on the right inguinal direct space.  Some bowing and bulging suspicious for spigelian type hernias in the right lower quadrant as well.  I did diagnostic laparoscopy and found the ileocecal region.  I ran the small bowel more proximally to the ligament of Treitz.  There were no interloop beat lesions or obstruction.  She did have some redundant sigmoid colon but there was no volvulus twisting or torsion.  No adhesions to the entire abdominal wall or any pelvic adhesions.  No obvious other source of abdominal pain from a surgical standpoint.  I focused back on trying to get the peritoneum down.  I had a more clean plane in the right flank of the peritoneum and  rolled to the ascending colon and a lateral medial fashion.  I used that healthier plane to  come more superiorly and towards the midline to eventually sharply freed off the peritoneum.  Some of the planes were obliterated around the arcuate ligament and the right rectus muscle.  However eventually was able to free things off.  They will come down to the pelvis.  Able to free off the thin peritoneum in the ileal colonic region off the retroperitoneum and right pelvic sidewall.  Came around anteriorly until I got to the pubic bone to get below the pubis to help see the femoral and obturator regions.  I saw no evidence of recurrence there carefully freed off peritoneum off the iliac vessels.  I could see the right ureter and the retroperitoneal position I was able to roll over enough help get that out of the way as well.  At the looked for nerves.  I could see the psoas muscles as well as quadratus.  Could skeletonize on top of it but it seemed apparent to me that the ileal inguinal and hypogastric were already obliterated and removed.  I could see a branch going up to the internal ring that seem consistent with a genital branch of the genitofemoral nerve.  Because she had had some femoral complaints last surgery with nerve block I held off on going for the femoral branch.  I decided to isolate and skeletonized the candidate for the genital branch of the genitofemoral nerve.  I skeletonized the to the opening and followed more proximally along the medial psoas border.  We sure this was distinct from the ureter.  Sharply freed it off.  Again looked and saw no good candidates for ilioinguinal nor iliohypogastric nerve so did not do any more aggressive nerve dissection.  Freed peritoneum off the anterior suprapubic region but held off going to the left side since there was intact ultra Pro mesh with no evidence of any infection or abnormality.  I did not encounter any purulence.  No caseating lesions.  No synovial fluid.  No milky lymphangiomas.  These things have been seen in the past.  There is no evidence  of this.  She clearly had thinned out area that could illuminate through her skin and a Swiss cheese fashion so decided to use mesh reinforcement since it was almost 2 years from removal of last mesh with no evidence of any recurrent infection.  I made sure hemostasis was good.  I mapped out the region using a needle passer.   To ensure that I would have at least 5 cm radial coverage outside of the hernia defect, I chose a 25x20cm dual sided mesh.  I placed #1 Prolene stitches around the medial and superior edges for about half the circumference about every 5 cm = 7 total.  I rolled the mesh & placed into the peritoneal cavity through the infraumbilical fascial defect.  I unrolled the mesh and positioned it appropriately.  I secured the mesh to cover up the hernia defect using a laparoscopic suture passer to pass the tails of the Prolene through the abdominal wall & tagged them with clamps for good transfascial suturing.  I started out in four corners to make sure I had the mesh centered under the hernia defect appropriately, and then proceeded to work in quadrants.   I was concerned I did not cover the right direct space very well so I placed a 15 x 15 cm ultra Pro mesh.  I did  a slit in the mesh and related such as a 6.6 cm flap rested in the right lateral pelvis covering the obturator and femoral regions.  Allow the medial cord to come towards the midline the other two thirds leg more laterally.  I secured that with 1 single secure strap tacker on the rectus muscle superior to the pubic bone to avoid nerve injury  Once I had the medial and superior edges up I allowed the mesh to roll along the right flank and over the retroperitoneum.  I used additional #1 Prolene sutures through right flank puncture [name] through the mesh and the peritoneum and back out to help secure the mesh laterally and help tacked the peritoneum and right colon over the retroperitoneal aspect of the mesh.  Again did this all under  laparoscopic guidance carefully I then did a few similar stitches centrally as well paramedian to have good central fixation of the mesh.  This mesh overlapped well with the right inguinal Ultrapro mesh  We evacuated CO2 & desufflated the abdomen.  I tied the fascial stitches down. I closed the fascial defect that I placed the mesh through using #1 PDS interrupted transverse stitches primarily.  I reinsufflated the abdomen. The mesh provided at least circumferential coverage around the entire region of hernia defects.  I tacked the edges & central part of the mesh to the peritoneum/posterior rectus fascia with SecureStrap absorbable tacks.  The peritoneum had thinned up in the right posterior pelvic region.  I used 2-0 Vicryl laparoscopic suturing to help tack the terminal ileal mesentery to cover up the exposed mesh as it naturally wished to lay.  This help protect the ultra pro mesh from exposure to any serosal bowel.  There was no more exposed ultra Pro mesh I did reinspection. Hemostasis was good. Mesh laid well. I completed a broad field block of local anesthesia at fascial stitch [name] & fascial closure areas.  Focused on a good tap block especially in the right greater than left sides.  Did do some in the right groin and pubis muscle but stayed above the inguinal region to avoid any femoral block  Capnoperitoneum was evacuated. Ports were removed.  Of note while she did have numerous flat seborrheic keratoses, she had 1 skin mass that was atypical.  It was more elevated multi pigmented in her right lateral chest subcostal region near where the more lateral sutures had been placed.  I did an elliptical incision to excise that leaving several millimeter margin.  I closed the resulting 22 x 11 mm wound with interrupted Monocryl suture.  The skin was closed with Monocryl at the port [name] and Steri-Strips on the fascial stitch puncture [name].  Patient is being extubated to go to the recovery room.  I had   discussed probable steps to recovery, and gave postoperative recommendations to the patient preop.  I will try to reach her husband  [name], M.D., F.A.C.S. Gastrointestinal and Minimally Invasive Surgery [facility] [address] [place], [place] [address] [phone] Main / Paging  01/24/2021 4:11 PM

## 2021-01-25 ENCOUNTER — Encounter (HOSPITAL_COMMUNITY): Payer: Self-pay | Admitting: Surgery

## 2021-01-28 LAB — SURGICAL PATHOLOGY

## 2021-02-06 ENCOUNTER — Other Ambulatory Visit: Payer: PPO

## 2021-02-06 ENCOUNTER — Ambulatory Visit: Payer: PPO | Admitting: Surgery

## 2021-03-05 ENCOUNTER — Other Ambulatory Visit: Payer: Self-pay

## 2021-03-05 ENCOUNTER — Ambulatory Visit
Admission: RE | Admit: 2021-03-05 | Discharge: 2021-03-05 | Disposition: A | Discharge status: Home / Self Care | Payer: PPO | Source: Ambulatory Visit | Attending: Family Medicine | Admitting: Family Medicine

## 2021-03-05 DIAGNOSIS — Z1231 Encounter for screening mammogram for malignant neoplasm of breast: Secondary | ICD-10-CM | POA: Diagnosis not present

## 2021-03-05 DIAGNOSIS — M810 Age-related osteoporosis without current pathological fracture: Secondary | ICD-10-CM | POA: Diagnosis not present

## 2021-03-05 DIAGNOSIS — Z780 Asymptomatic menopausal state: Secondary | ICD-10-CM | POA: Diagnosis not present

## 2021-03-05 DIAGNOSIS — M8588 Other specified disorders of bone density and structure, other site: Secondary | ICD-10-CM | POA: Diagnosis not present

## 2021-03-05 DIAGNOSIS — E2839 Other primary ovarian failure: Secondary | ICD-10-CM

## 2021-03-19 DIAGNOSIS — J329 Chronic sinusitis, unspecified: Secondary | ICD-10-CM | POA: Diagnosis not present

## 2021-04-04 DIAGNOSIS — E538 Deficiency of other specified B group vitamins: Secondary | ICD-10-CM | POA: Diagnosis not present

## 2021-04-04 DIAGNOSIS — G894 Chronic pain syndrome: Secondary | ICD-10-CM | POA: Diagnosis not present

## 2021-04-04 DIAGNOSIS — F330 Major depressive disorder, recurrent, mild: Secondary | ICD-10-CM | POA: Diagnosis not present

## 2021-04-04 DIAGNOSIS — E785 Hyperlipidemia, unspecified: Secondary | ICD-10-CM | POA: Diagnosis not present

## 2021-04-04 DIAGNOSIS — Z79899 Other long term (current) drug therapy: Secondary | ICD-10-CM | POA: Diagnosis not present

## 2021-04-04 DIAGNOSIS — M899 Disorder of bone, unspecified: Secondary | ICD-10-CM | POA: Diagnosis not present

## 2021-04-04 DIAGNOSIS — Z0000 Encounter for general adult medical examination without abnormal findings: Secondary | ICD-10-CM | POA: Diagnosis not present

## 2021-04-04 DIAGNOSIS — J309 Allergic rhinitis, unspecified: Secondary | ICD-10-CM | POA: Diagnosis not present

## 2021-04-04 DIAGNOSIS — J449 Chronic obstructive pulmonary disease, unspecified: Secondary | ICD-10-CM | POA: Diagnosis not present

## 2021-04-04 DIAGNOSIS — I10 Essential (primary) hypertension: Secondary | ICD-10-CM | POA: Diagnosis not present

## 2021-04-04 DIAGNOSIS — E559 Vitamin D deficiency, unspecified: Secondary | ICD-10-CM | POA: Diagnosis not present

## 2021-04-11 ENCOUNTER — Other Ambulatory Visit: Payer: Self-pay

## 2021-04-11 ENCOUNTER — Ambulatory Visit
Admission: RE | Admit: 2021-04-11 | Discharge: 2021-04-11 | Disposition: A | Discharge status: Home / Self Care | Payer: PPO | Source: Ambulatory Visit | Attending: Emergency Medicine | Admitting: Emergency Medicine

## 2021-04-11 VITALS — BP 138/80 | HR 97 | Temp 97.3°F | Resp 16

## 2021-04-11 DIAGNOSIS — H6521 Chronic serous otitis media, right ear: Secondary | ICD-10-CM

## 2021-04-11 DIAGNOSIS — H9201 Otalgia, right ear: Secondary | ICD-10-CM | POA: Diagnosis not present

## 2021-04-11 MED ORDER — DOXYCYCLINE HYCLATE 100 MG PO CAPS: 100.0000 mg | ORAL_CAPSULE | Freq: Two times a day (BID) | ORAL | 0 refills | Status: DC

## 2021-04-11 NOTE — ED Provider Notes (Signed)
[facility]    CSN: [id number] Arrival date & time: 04/11/21  1028      History   Chief Complaint Chief Complaint  Patient presents with   Otalgia    Right ear     HPI Catherine [name] is a 75 y.o. female.   Was seen at pcp office treated for sinus infection. Had lt ear irrigated and since then has pain to rt ear getting worse. Took a z pack for sinus and the ear is still painful. Denies any fever. Wears hearing aids and it is painful to place in ear.    Past Medical History:  Diagnosis Date   Abdominal pain 06/06/2019   Anxiety    Aortic aneurysm ([place]) 12/08/2018   Arthritis    HANDS   BCC (basal cell carcinoma) 04/07/1995   right jawline (CX3, 5FU)   BCC (basal cell carcinoma) 05/19/2013   right scalp/post (treatment biopsy)   Chronic constipation    Complication of anesthesia    upon waking up from anesthesia patient experienced chest pain - EKG done was negative, patient states "it may have been indigestion"   COPD (chronic obstructive pulmonary disease) (HCC)    DDD (degenerative disc disease), lumbar    L2-3/3-4/4-5   Dizziness 01/17/2019   DOE (dyspnea on exertion) 01/17/2019   Dysplastic nevus 12/04/2009   mod-severe left forearm, exc   Dysuria    Frequency of urination    Ganglion cyst of right groin 11/23/2018   GERD (gastroesophageal reflux disease)    Hearing aid worn    Infected hernioplasty mesh ([place]) 01/26/2019   Keratosis, actinic 02/18/1991   left abdomen   Migraine    Mild emphysema ([place])    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   Osteopenia 2019   TO OSTEOPOROSIS    Pelvic pain in female    Seroma, post-traumatic ([place]) 04/27/2019   Smokers' cough ([place])    Thoracic aortic aneurysm (TAA) (HCC)    FOLLOWED BY DR Cyndia Bent  LAST CT CHEST  WAS 6-3 SEE EPIC    Urgency of urination    h/o INTERSTIAL CYSTITIS...DR. 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   Groin abscess 06/16/2018   Anxiety    Urgency of urination    DDD (degenerative disc disease), lumbar    Osteopenia 08/04/2017   Right sided sciatica 02/18/2017   Piriformis muscle pain 02/18/2017   Right groin pain 11/04/2016   Neuropathy 11/04/2016   Numbness 11/04/2016   Depression with anxiety 11/04/2016   Dysfunction of right eustachian tube 10/30/2015   Presbycusis of both ears 10/30/2015   Sensory hearing loss, bilateral 10/30/2015   Subjective tinnitus of both ears 10/30/2015   Atypical chest pain 05/08/2014    Past Surgical History:  Procedure Laterality Date   ABDOMINAL HYSTERECTOMY  1982   W/  UNILATERAL SALPINGOOPHORECTOMY   bilateral hernia surgery      removed ganglion cyst and swollen lymph node    BREAST  BIOPSY Right    Years ago- benign   CHOLECYSTECTOMY OPEN  1987   W/  APPENDECTOMY   COLONOSCOPY  2002  &  12-2012 & 03/19/15   CYSTO WITH HYDRODISTENSION N/A 02/20/2014   Procedure: [place] AND MARCAINE INSTILLATION;  Surgeon: Bernestine Amass, MD;  Location: [facility];  Service: Urology;  Laterality: N/A;   EXCISION MASS LOWER EXTREMETIES Right 11/02/2018   Procedure: REMOVAL OF RIGHT GROIN AND THIGH SUBCUTANEOUS MASSES.;  Surgeon: Michael Boston, MD;  Location: [place];  Service: General;  Laterality: Right;   EYE SURGERY     bilateral cataract surgery    GROIN DISSECTION N/A 02/28/2019   Procedure: GROIN EXPLORATION AND DEBRIDEMENT;  Surgeon: Michael Boston, MD;  Location: [facility];  Service: General;  Laterality: N/A;   HERNIA REPAIR     INCISION AND DRAINAGE ABSCESS N/A 12/09/2018   Procedure: INCISION AND DRAINAGE RIGHT GROIN and thigh  WITH DEBRIDEMENT;  Surgeon: Michael Boston, MD;  Location: [facility];  Service: General;  Laterality: N/A;   IR CATHETER TUBE CHANGE  05/05/2019   IR RADIOLOGIST EVAL & MGMT  05/03/2019   IR RADIOLOGIST EVAL & MGMT  05/10/2019   KNEE ARTHROSCOPY W/ MENISCAL REPAIR Right 2021   LAPAROSCOPIC ABDOMINAL EXPLORATION Right 02/28/2019   Procedure: LAPAROSCOPIC EXPLORATION WITH REMOVAL OF RIGHT PREPERITONEAL MESH;  Surgeon: Michael Boston, MD;  Location: [facility];  Service: General;  Laterality: Right;   LAPAROSCOPIC LYSIS OF ADHESIONS Bilateral 02/28/2019   Procedure: LAPAROSCOPIC LYSIS OF ADHESIONS;  Surgeon: Michael Boston, MD;  Location: [facility];  Service: General;  Laterality: Bilateral;   LAPAROSCOPIC UNILATERAL SALPINGOOPHORECTOMY  1984   LAPAROSCOPY N/A 01/24/2021   Procedure: LAPAROSCOPY DIAGNOSTIC;  Surgeon: Michael Boston, MD;  Location: [facility];  Service: General;  Laterality: N/A;   LESION REMOVAL Right 02/28/2019   Procedure: EXCISION VAGINAL ABSCESS  AND DISSECTION;  Surgeon: Michael Boston, MD;  Location: [facility];  Service: General;  Laterality: Right;   MASS EXCISION Right 06/16/2018   Procedure: REMOVAL OF RIGHT GROIN SUBCUTANEOUS MASS ERAS PATHWAY;  Surgeon: Michael Boston, MD;  Location: [facility];  Service: General;  Laterality: Right;   removal of lymph node cyst in right groin      ROTATOR CUFF REPAIR Right 2003   and BONE SPUR   VENTRAL HERNIA REPAIR N/A 01/24/2021   Procedure: LYSIS OF ADHESIONS, RIGHT INGUINAL AND INCISIONAL HERNIA REPAIR WITH MESH.  TAP BLOCK;  Surgeon: Michael Boston, MD;  Location: [facility];  Service: General;  Laterality: N/A;    OB History   No obstetric history on file.      Home Medications    Prior to Admission medications   Medication Sig Start Date End Date Taking?  Authorizing Provider  doxycycline (VIBRAMYCIN) 100 MG capsule Take 1 capsule (100 mg total) by mouth 2 (two) times daily. 04/11/21  Yes Marney Setting, NP  acetaminophen (TYLENOL) 500 MG tablet Take 1,000 mg by mouth at bedtime.    [provider]  albuterol (PROVENTIL HFA;VENTOLIN HFA) 108 (90 Base) MCG/ACT inhaler Inhale 2 puffs into the lungs every 4 (four) hours as needed for wheezing or shortness of breath.    [provider]  BREZTRI AEROSPHERE 160-9-4.8 MCG/ACT AERO Inhale 2 puffs into the lungs 2 (two) times daily. 09/25/20   [provider]  CALCIUM CARBONATE ANTACID PO Take 1,000 mg by mouth daily.     [provider]  carboxymethylcellulose (REFRESH PLUS) 0.5 % SOLN  Place 2 drops into both eyes 3 (three) times daily.     [provider]  Cholecalciferol (VITAMIN D3 PO) Take 6,000 Units by mouth daily.    [provider]  COVID-19 mRNA vaccine, Moderna, 100 MCG/0.5ML injection INJECT AS DIRECTED 06/06/20 06/06/21  Carlyle Basques, MD  COVID-19 mRNA vaccine, Moderna, 100 MCG/0.5ML injection Inject into the muscle. 12/04/20   Carlyle Basques, MD  Cyanocobalamin (VITAMIN B-12 PO) Take 2,500 mcg by mouth daily.    [provider]  denosumab (PROLIA) 60 MG/ML SOSY injection Inject 60 mg into the skin every 6 (six) months.    [provider]  docusate sodium (COLACE) 100 MG capsule Take 200 mg by mouth daily.     [provider]  esomeprazole (NEXIUM) 20 MG capsule Take 20 mg by mouth daily.    [provider]  gabapentin (NEURONTIN) 100 MG capsule Take 100 mg by mouth 3 (three) times daily.    [provider]  LORazepam (ATIVAN) 1 MG tablet Take 0.5-1 mg by mouth See admin instructions. Take 0.5 mg in the morning and 1.0 mg at bedtime    [provider]  polyethylene glycol (MIRALAX / GLYCOLAX) packet Take 17 g by mouth daily.     [provider]  senna (SENOKOT) 8.6 MG TABS tablet  Take 4 tablets by mouth at bedtime.    [provider]  SUMAtriptan (IMITREX) 50 MG tablet Take 50 mg by mouth every 2 (two) hours as needed for migraine. May repeat in 2 hours if headache persists or recurs.    [provider]  traMADol (ULTRAM) 50 MG tablet Take 50 mg by mouth every 6 (six) hours as needed for moderate pain.    [provider]  traMADol (ULTRAM) 50 MG tablet Take 1-2 tablets (50-100 mg total) by mouth every 6 (six) hours as needed for moderate pain or severe pain. 01/24/21   Michael Boston, MD    Family History Family History  Problem Relation Age of Onset   Esophageal cancer Mother 45   Lung cancer Father 47   Breast cancer Maternal Aunt     Social History Social History   Tobacco Use   Smoking status: Former    Packs/day: 0.75    Years: 48.00    Pack years: 36.00    Types: Cigarettes    Quit date: 09/04/2017    Years since quitting: 3.6   Smokeless tobacco: Never  Vaping Use   Vaping Use: Never used  Substance Use Topics   Alcohol use: No   Drug use: No     Allergies   Ciprofloxacin hcl, Metoprolol, Penicillins, Oxycodone, Fosamax [alendronate], and Advair diskus [fluticasone-salmeterol]   Review of Systems Review of Systems  Constitutional: Negative.   HENT:  Positive for congestion, ear pain, postnasal drip, rhinorrhea, sinus pressure and sinus pain. Negative for ear discharge and sneezing.   Respiratory:  Positive for cough. Negative for shortness of breath.        Hx copd   Gastrointestinal: Negative.   Genitourinary: Negative.   Neurological: Negative.     Physical Exam Triage Vital Signs ED Triage Vitals  Enc Vitals Group     BP 04/11/21 1101 138/80     Pulse Rate 04/11/21 1101 97     Resp 04/11/21 1101 16     Temp 04/11/21 1101 (!) 97.3 F (36.3 C)     Temp Source 04/11/21 1101 Temporal     SpO2 04/11/21 1101 96 %  Weight --      Height --      Head Circumference --      Peak Flow --      Pain Score  04/11/21 1107 4     Pain Loc --      Pain Edu? --      Excl. in [place]? --    No data found.  Updated Vital Signs BP 138/80 (BP Location: Right Arm)   Pulse 97   Temp (!) 97.3 F (36.3 C) (Temporal)   Resp 16   SpO2 96%   Visual Acuity Right Eye Distance:   Left Eye Distance:   Bilateral Distance:    Right Eye Near:   Left Eye Near:    Bilateral Near:     Physical Exam Constitutional:      Appearance: Normal appearance.  HENT:     Right Ear: Swelling and tenderness present. A middle ear effusion is present. Tympanic membrane is erythematous and bulging.     Left Ear: Tympanic membrane normal.     Ears:     Comments: Wears bil hearing aid s    Nose: Rhinorrhea present.     Mouth/Throat:     Mouth: Mucous membranes are moist.  Eyes:     Pupils: Pupils are equal, round, and reactive to light.  Cardiovascular:     Rate and Rhythm: Normal rate.  Pulmonary:     Effort: Pulmonary effort is normal.  Abdominal:     General: Abdomen is flat.  Neurological:     Mental Status: She is alert.     UC Treatments / Results  Labs (all labs ordered are listed, but only abnormal results are displayed) Labs Reviewed - No data to display  EKG   Radiology No results found.  Procedures Procedures (including critical care time)  Medications Ordered in UC Medications - No data to display  Initial Impression / Assessment and Plan / UC Course  I have reviewed the triage vital signs and the nursing notes.  Pertinent labs & imaging results that were available during my care of the patient were reviewed by me and considered in my medical decision making (see chart for details).     You have asked for a note for travel you can decide if you are well enough  Take medications with food  You already have ear drops from previous infection to use  Take tylenol as needed for pain  Try taking a low dose zyrtec or Claritin daily to help with drainage  Final Clinical Impressions(s) /  UC Diagnoses   Final diagnoses:  Right ear pain  Right chronic serous otitis media     Discharge Instructions      You have asked for a note for travel you can decide if you are well enough  Take medications with food  You already have ear drops from previous infection to use  Take tylenol as needed for pain  Try taking a low dose zyrtec or Claritin daily to help with drainage       ED Prescriptions     Medication Sig Dispense Auth. Provider   doxycycline (VIBRAMYCIN) 100 MG capsule Take 1 capsule (100 mg total) by mouth 2 (two) times daily. 20 capsule Marney Setting, NP      PDMP not reviewed this encounter.   Marney Setting, NP 04/11/21 1134

## 2021-04-11 NOTE — ED Triage Notes (Signed)
Patient presents to Urgent Care with complaints of right ear pain and right sided facial pressure. Pt states she was treated for sinus infection 4 week ago reports symptoms had improved but over the week returned. Pt states she was also seen last Thursday for ear discomfort. She had ear irrigation completed that made her ear pain worse. Treating symptoms with sinus rinse, nasal spray and mucinex.  Denies fever.

## 2021-04-11 NOTE — Discharge Instructions (Addendum)
You have asked for a note for travel you can decide if you are well enough  Take medications with food  You already have ear drops from previous infection to use  Take tylenol as needed for pain  Try taking a low dose zyrtec or Claritin daily to help with drainage

## 2021-04-25 DIAGNOSIS — H6981 Other specified disorders of Eustachian tube, right ear: Secondary | ICD-10-CM | POA: Diagnosis not present

## 2021-04-25 DIAGNOSIS — H9201 Otalgia, right ear: Secondary | ICD-10-CM | POA: Diagnosis not present

## 2021-04-29 DIAGNOSIS — M810 Age-related osteoporosis without current pathological fracture: Secondary | ICD-10-CM | POA: Diagnosis not present

## 2021-05-17 DIAGNOSIS — H269 Unspecified cataract: Secondary | ICD-10-CM | POA: Diagnosis not present

## 2021-05-17 DIAGNOSIS — H6981 Other specified disorders of Eustachian tube, right ear: Secondary | ICD-10-CM | POA: Diagnosis not present

## 2021-05-17 DIAGNOSIS — J449 Chronic obstructive pulmonary disease, unspecified: Secondary | ICD-10-CM | POA: Diagnosis not present

## 2021-05-17 DIAGNOSIS — G43109 Migraine with aura, not intractable, without status migrainosus: Secondary | ICD-10-CM | POA: Diagnosis not present

## 2021-05-17 DIAGNOSIS — J45909 Unspecified asthma, uncomplicated: Secondary | ICD-10-CM | POA: Diagnosis not present

## 2021-05-17 DIAGNOSIS — E785 Hyperlipidemia, unspecified: Secondary | ICD-10-CM | POA: Diagnosis not present

## 2021-05-17 DIAGNOSIS — M810 Age-related osteoporosis without current pathological fracture: Secondary | ICD-10-CM | POA: Diagnosis not present

## 2021-05-17 DIAGNOSIS — I10 Essential (primary) hypertension: Secondary | ICD-10-CM | POA: Diagnosis not present

## 2021-05-17 DIAGNOSIS — J45901 Unspecified asthma with (acute) exacerbation: Secondary | ICD-10-CM | POA: Diagnosis not present

## 2021-05-17 DIAGNOSIS — J441 Chronic obstructive pulmonary disease with (acute) exacerbation: Secondary | ICD-10-CM | POA: Diagnosis not present

## 2021-05-17 DIAGNOSIS — F322 Major depressive disorder, single episode, severe without psychotic features: Secondary | ICD-10-CM | POA: Diagnosis not present

## 2021-05-20 ENCOUNTER — Ambulatory Visit: Payer: PPO | Attending: Internal Medicine

## 2021-05-20 DIAGNOSIS — Z23 Encounter for immunization: Secondary | ICD-10-CM

## 2021-05-20 NOTE — Progress Notes (Signed)
   Covid-19 Vaccination Clinic  Name:  Catherine [name]    MRN: [id number] DOB: 12-28-1945  05/20/2021  [name] was observed post Covid-19 immunization for 15 minutes without incident. She was provided with Vaccine Information Sheet and instruction to access the V-Safe system.   [name] was instructed to call 911 with any severe reactions post vaccine: Difficulty breathing  Swelling of face and throat  A fast heartbeat  A bad rash all over body  Dizziness and weakness

## 2021-05-28 DIAGNOSIS — N949 Unspecified condition associated with female genital organs and menstrual cycle: Secondary | ICD-10-CM | POA: Diagnosis not present

## 2021-05-28 DIAGNOSIS — N952 Postmenopausal atrophic vaginitis: Secondary | ICD-10-CM | POA: Diagnosis not present

## 2021-05-29 DIAGNOSIS — R300 Dysuria: Secondary | ICD-10-CM | POA: Diagnosis not present

## 2021-06-03 DIAGNOSIS — Z961 Presence of intraocular lens: Secondary | ICD-10-CM | POA: Diagnosis not present

## 2021-06-03 DIAGNOSIS — H40011 Open angle with borderline findings, low risk, right eye: Secondary | ICD-10-CM | POA: Diagnosis not present

## 2021-06-03 DIAGNOSIS — H04123 Dry eye syndrome of bilateral lacrimal glands: Secondary | ICD-10-CM | POA: Diagnosis not present

## 2021-06-17 ENCOUNTER — Other Ambulatory Visit (HOSPITAL_BASED_OUTPATIENT_CLINIC_OR_DEPARTMENT_OTHER): Payer: Self-pay

## 2021-06-17 MED ORDER — MODERNA COVID-19 BIVAL BOOSTER 50 MCG/0.5ML IM SUSP
INTRAMUSCULAR | 0 refills | Status: DC
  Filled 2021-06-17: qty 0.5, 1d supply, fill #0

## 2021-06-18 DIAGNOSIS — R300 Dysuria: Secondary | ICD-10-CM | POA: Diagnosis not present

## 2021-06-18 DIAGNOSIS — N760 Acute vaginitis: Secondary | ICD-10-CM | POA: Diagnosis not present

## 2021-06-24 DIAGNOSIS — F330 Major depressive disorder, recurrent, mild: Secondary | ICD-10-CM | POA: Diagnosis not present

## 2021-06-24 DIAGNOSIS — M810 Age-related osteoporosis without current pathological fracture: Secondary | ICD-10-CM | POA: Diagnosis not present

## 2021-06-24 DIAGNOSIS — F322 Major depressive disorder, single episode, severe without psychotic features: Secondary | ICD-10-CM | POA: Diagnosis not present

## 2021-06-24 DIAGNOSIS — J449 Chronic obstructive pulmonary disease, unspecified: Secondary | ICD-10-CM | POA: Diagnosis not present

## 2021-06-24 DIAGNOSIS — I10 Essential (primary) hypertension: Secondary | ICD-10-CM | POA: Diagnosis not present

## 2021-06-24 DIAGNOSIS — E785 Hyperlipidemia, unspecified: Secondary | ICD-10-CM | POA: Diagnosis not present

## 2021-06-24 DIAGNOSIS — H269 Unspecified cataract: Secondary | ICD-10-CM | POA: Diagnosis not present

## 2021-06-24 DIAGNOSIS — G43109 Migraine with aura, not intractable, without status migrainosus: Secondary | ICD-10-CM | POA: Diagnosis not present

## 2021-06-24 DIAGNOSIS — J45901 Unspecified asthma with (acute) exacerbation: Secondary | ICD-10-CM | POA: Diagnosis not present

## 2021-06-24 DIAGNOSIS — J441 Chronic obstructive pulmonary disease with (acute) exacerbation: Secondary | ICD-10-CM | POA: Diagnosis not present

## 2021-06-24 DIAGNOSIS — J45909 Unspecified asthma, uncomplicated: Secondary | ICD-10-CM | POA: Diagnosis not present

## 2021-07-01 DIAGNOSIS — Z6823 Body mass index (BMI) 23.0-23.9, adult: Secondary | ICD-10-CM | POA: Diagnosis not present

## 2021-07-01 DIAGNOSIS — Z124 Encounter for screening for malignant neoplasm of cervix: Secondary | ICD-10-CM | POA: Diagnosis not present

## 2021-07-01 DIAGNOSIS — N760 Acute vaginitis: Secondary | ICD-10-CM | POA: Diagnosis not present

## 2021-07-01 DIAGNOSIS — Z779 Other contact with and (suspected) exposures hazardous to health: Secondary | ICD-10-CM | POA: Diagnosis not present

## 2021-07-29 ENCOUNTER — Encounter (HOSPITAL_COMMUNITY): Payer: Self-pay

## 2021-07-29 ENCOUNTER — Ambulatory Visit (HOSPITAL_COMMUNITY)
Admission: RE | Admit: 2021-07-29 | Discharge: 2021-07-29 | Disposition: A | Discharge status: Home / Self Care | Payer: PPO | Source: Ambulatory Visit | Attending: Physician Assistant | Admitting: Physician Assistant

## 2021-07-29 ENCOUNTER — Other Ambulatory Visit: Payer: Self-pay

## 2021-07-29 VITALS — BP 134/85 | HR 83 | Temp 99.3°F | Resp 18

## 2021-07-29 DIAGNOSIS — K1379 Other lesions of oral mucosa: Secondary | ICD-10-CM | POA: Insufficient documentation

## 2021-07-29 DIAGNOSIS — B370 Candidal stomatitis: Secondary | ICD-10-CM

## 2021-07-29 DIAGNOSIS — B3731 Acute candidiasis of vulva and vagina: Secondary | ICD-10-CM | POA: Diagnosis not present

## 2021-07-29 DIAGNOSIS — K1370 Unspecified lesions of oral mucosa: Secondary | ICD-10-CM | POA: Insufficient documentation

## 2021-07-29 LAB — POCT RAPID STREP A, ED / UC: Streptococcus, Group A Screen (Direct): NEGATIVE

## 2021-07-29 MED ORDER — NYSTATIN 100000 UNIT/ML MT SUSP: 500000.0000 [IU] | Freq: Four times a day (QID) | OROMUCOSAL | 0 refills | Status: DC

## 2021-07-29 MED ORDER — FLUCONAZOLE 150 MG PO TABS: 150.0000 mg | ORAL_TABLET | ORAL | 0 refills | Status: DC

## 2021-07-29 NOTE — Discharge Instructions (Signed)
Your strep test was negative.  I believe that you have yeast contributing to your symptoms.  Start Diflucan once every 3 days for total of 3 doses.  Start nystatin oral suspension 4 times a day for the next week.  Gargle with warm salt water.  I would recommend discussing inhaler use with your PCP to consider a different medication since you have recurrent symptoms related to use.  If you have any worsening symptoms including inability to swallow, swelling of your throat/tongue, fevers, persistent symptoms despite treatment you need to be seen immediately.  Follow-up with your PCP next week.

## 2021-07-29 NOTE — ED Triage Notes (Signed)
Pt presents with c/o sores in the back of her mouth and her gums. States the R side of her throat hurts and is painful to swallow. States she is concerned of thrush. Reports using inhalers.   Reports she has a vaginal yeast infection. States she has white discharge and burning.

## 2021-07-29 NOTE — ED Provider Notes (Signed)
[facility]    CSN: [id number] Arrival date & time: 07/29/21  0841      History   Chief Complaint Chief Complaint  Patient presents with   Appointment    HPI Catherine [name] is a 75 y.o. female.   Patient presents today with a weeklong history of worsening sore throat pain and mouth sores.  She reports that pain is severe and interfering with her ability to eat and drink though she has been pushing fluids despite symptoms.  Pain is rated 7 on a 0-10 pain scale, localized to posterior oropharynx worse on the right, described as sharp, no aggravating relieving factors identified.  She denies any known exposure to strep pharyngitis but states symptoms are similar to previous episodes of this condition is interested in testing to ensure it is not strep.  She denies any chemotherapy, history of immunosuppression, diabetes.  She does have a history of COPD related to smoking (quit 4 years ago) and approximately a month ago changed to [facility] and has had increasing mouth sores since this change; was previously on Symbicort.  She does report rinsing her mouth and brushing her teeth after use.  In addition, patient reports vaginal irritation as well as white discharge consistent with yeast infection.  She has a history of recurrent vaginal yeast infections and has seen her OB/GYN who prescribed topical estrogen.  She reports using this regularly and has had a decreased number of yeast infections but continues to have them intermittently.  She does report using antibiotics several months ago but denies any recent antibiotic use just prior to symptom onset.  Denies any changes to personal hygiene products including soaps or detergents.   Past Medical History:  Diagnosis Date   Abdominal pain 06/06/2019   Anxiety    Aortic aneurysm ([place]) 12/08/2018   Arthritis    HANDS   BCC (basal cell carcinoma) 04/07/1995   right jawline (CX3, 5FU)   BCC (basal cell carcinoma) 05/19/2013   right  scalp/post (treatment biopsy)   Chronic constipation    Complication of anesthesia    upon waking up from anesthesia patient experienced chest pain - EKG done was negative, patient states "it may have been indigestion"   COPD (chronic obstructive pulmonary disease) (HCC)    DDD (degenerative disc disease), lumbar    L2-3/3-4/4-5   Dizziness 01/17/2019   DOE (dyspnea on exertion) 01/17/2019   Dysplastic nevus 12/04/2009   mod-severe left forearm, exc   Dysuria    Frequency of urination    Ganglion cyst of right groin 11/23/2018   GERD (gastroesophageal reflux disease)    Hearing aid worn    Infected hernioplasty mesh ([place]) 01/26/2019   Keratosis, actinic 02/18/1991   left abdomen   Migraine    Mild emphysema ([place])    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   Groin abscess 06/16/2018   Anxiety    Urgency of urination    DDD (degenerative disc disease), lumbar    Osteopenia 08/04/2017   Right sided sciatica 02/18/2017   Piriformis muscle pain 02/18/2017   Right groin pain 11/04/2016   Neuropathy 11/04/2016   Numbness  11/04/2016   Depression with anxiety 11/04/2016   Dysfunction of right eustachian tube 10/30/2015   Presbycusis of both ears 10/30/2015   Sensory hearing loss, bilateral 10/30/2015   Subjective tinnitus of both ears 10/30/2015   Atypical chest pain 05/08/2014    Past Surgical History:  Procedure Laterality Date   ABDOMINAL HYSTERECTOMY  1982   W/  UNILATERAL SALPINGOOPHORECTOMY   bilateral hernia surgery      removed ganglion cyst and swollen lymph node    BREAST BIOPSY Right    Years ago- benign   CHOLECYSTECTOMY OPEN  1987   W/  APPENDECTOMY   COLONOSCOPY  2002  &  12-2012 & 03/19/15   CYSTO WITH HYDRODISTENSION N/A 02/20/2014   Procedure: [place] AND MARCAINE INSTILLATION;  Surgeon: Bernestine Amass, MD;  Location: [facility];  Service: Urology;  Laterality: N/A;   EXCISION MASS LOWER EXTREMETIES Right 11/02/2018   Procedure: REMOVAL OF RIGHT GROIN AND THIGH SUBCUTANEOUS MASSES.;  Surgeon: Michael Boston, MD;  Location: [facility];  Service: General;  Laterality: Right;   EYE SURGERY     bilateral cataract surgery    GROIN DISSECTION N/A 02/28/2019   Procedure: GROIN EXPLORATION AND DEBRIDEMENT;  Surgeon: Michael Boston, MD;  Location: [facility];  Service: General;  Laterality: N/A;   HERNIA REPAIR     INCISION AND DRAINAGE ABSCESS N/A 12/09/2018   Procedure: INCISION AND DRAINAGE RIGHT GROIN and thigh  WITH DEBRIDEMENT;  Surgeon: Michael Boston, MD;  Location: [facility];  Service: General;  Laterality: N/A;   IR CATHETER TUBE CHANGE  05/05/2019   IR RADIOLOGIST EVAL & MGMT  05/03/2019   IR RADIOLOGIST EVAL & MGMT  05/10/2019   KNEE ARTHROSCOPY W/ MENISCAL REPAIR Right 2021   LAPAROSCOPIC ABDOMINAL EXPLORATION Right 02/28/2019   Procedure: LAPAROSCOPIC EXPLORATION WITH REMOVAL OF RIGHT PREPERITONEAL MESH;  Surgeon: Michael Boston, MD;  Location: [facility];  Service: General;  Laterality: Right;   LAPAROSCOPIC LYSIS OF ADHESIONS Bilateral  02/28/2019   Procedure: LAPAROSCOPIC LYSIS OF ADHESIONS;  Surgeon: Michael Boston, MD;  Location: [facility];  Service: General;  Laterality: Bilateral;   LAPAROSCOPIC UNILATERAL SALPINGOOPHORECTOMY  1984   LAPAROSCOPY N/A 01/24/2021   Procedure: LAPAROSCOPY DIAGNOSTIC;  Surgeon: Michael Boston, MD;  Location: [facility];  Service: General;  Laterality: N/A;   LESION REMOVAL Right 02/28/2019   Procedure: EXCISION VAGINAL ABSCESS  AND DISSECTION;  Surgeon: Michael Boston, MD;  Location: [facility];  Service: General;  Laterality: Right;   MASS EXCISION Right 06/16/2018   Procedure: REMOVAL OF RIGHT GROIN SUBCUTANEOUS MASS ERAS PATHWAY;  Surgeon: Michael Boston, MD;  Location: [facility];  Service: General;  Laterality: Right;   removal of lymph node cyst in right groin      ROTATOR CUFF REPAIR Right 2003   and BONE SPUR   VENTRAL HERNIA REPAIR N/A 01/24/2021   Procedure: LYSIS OF ADHESIONS, RIGHT INGUINAL AND INCISIONAL HERNIA  REPAIR WITH MESH.  TAP BLOCK;  Surgeon: Michael Boston, MD;  Location: [facility];  Service: General;  Laterality: N/A;    OB History   No obstetric history on file.      Home Medications    Prior to Admission medications   Medication Sig Start Date End Date Taking? Authorizing Provider  escitalopram (LEXAPRO) 5 MG tablet Take 5 mg by mouth daily.   Yes [provider]  fluconazole (DIFLUCAN) 150 MG tablet Take 1 tablet (150 mg total) by mouth every 3 (three) days. 07/29/21  Yes Tomiko Schoon K, PA-C  nystatin (MYCOSTATIN) 100000 UNIT/ML suspension Use as directed 5 mLs (500,000 Units total) in the mouth or throat 4 (four) times daily. 07/29/21  Yes Hend Mccarrell, Derry Skill, PA-C  acetaminophen (TYLENOL) 500 MG tablet Take 1,000 mg by mouth at bedtime.    [provider]  albuterol (PROVENTIL HFA;VENTOLIN HFA) 108 (90 Base) MCG/ACT inhaler Inhale 2 puffs into the lungs every 4 (four) hours as needed for wheezing or shortness of breath.    [provider]  BREZTRI  AEROSPHERE 160-9-4.8 MCG/ACT AERO Inhale 2 puffs into the lungs 2 (two) times daily. 09/25/20   [provider]  CALCIUM CARBONATE ANTACID PO Take 1,000 mg by mouth daily.     [provider]  carboxymethylcellulose (REFRESH PLUS) 0.5 % SOLN Place 2 drops into both eyes 3 (three) times daily.     [provider]  Cholecalciferol (VITAMIN D3 PO) Take 6,000 Units by mouth daily.    [provider]  COVID-19 mRNA bivalent vaccine, Moderna, (MODERNA COVID-19 BIVAL BOOSTER) 50 MCG/0.5ML injection Inject into the muscle. 05/20/21   Carlyle Basques, MD  COVID-19 mRNA vaccine, Moderna, 100 MCG/0.5ML injection Inject into the muscle. 12/04/20   Carlyle Basques, MD  Cyanocobalamin (VITAMIN B-12 PO) Take 2,500 mcg by mouth daily.    [provider]  denosumab (PROLIA) 60 MG/ML SOSY injection Inject 60 mg into the skin every 6 (six) months.    [provider]  docusate sodium (COLACE) 100 MG capsule Take 200 mg by mouth daily.     [provider]  doxycycline (VIBRAMYCIN) 100 MG capsule Take 1 capsule (100 mg total) by mouth 2 (two) times daily. 04/11/21   Marney Setting, NP  esomeprazole (NEXIUM) 20 MG capsule Take 20 mg by mouth daily.    [provider]  gabapentin (NEURONTIN) 100 MG capsule Take 100 mg by mouth 1 day or 1 dose.    [provider]  LORazepam (ATIVAN) 1 MG tablet Take 0.5-1 mg by mouth See admin instructions. Take 0.5 mg in the morning and 1.0 mg at bedtime    [provider]  polyethylene glycol (MIRALAX / GLYCOLAX) packet Take 17 g by mouth daily.     [provider]  senna (SENOKOT) 8.6 MG TABS tablet Take 4 tablets by mouth at bedtime.    [provider]  SUMAtriptan (IMITREX) 50 MG tablet Take 50 mg by mouth every 2 (two) hours as needed for migraine. May repeat in 2 hours if headache persists or recurs.    [provider]  traMADol (ULTRAM) 50 MG tablet Take 50 mg by mouth  every 6 (six) hours as needed for moderate pain.    [provider]  traMADol (ULTRAM) 50 MG tablet Take 1-2 tablets (50-100 mg total) by mouth every 6 (six) hours as needed for moderate pain or severe pain. 01/24/21   Michael Boston, MD  Family History Family History  Problem Relation Age of Onset   Esophageal cancer Mother 38   Lung cancer Father 50   Breast cancer Maternal Aunt     Social History Social History   Tobacco Use   Smoking status: Former    Packs/day: 0.75    Years: 48.00    Pack years: 36.00    Types: Cigarettes    Quit date: 09/04/2017    Years since quitting: 3.9   Smokeless tobacco: Never  Vaping Use   Vaping Use: Never used  Substance Use Topics   Alcohol use: No   Drug use: No     Allergies   Ciprofloxacin hcl, Metoprolol, Penicillins, Oxycodone, Fosamax [alendronate], and Advair diskus [fluticasone-salmeterol]   Review of Systems Review of Systems  Constitutional:  Positive for activity change. Negative for appetite change, fatigue and fever.  HENT:  Positive for mouth sores, sore throat and trouble swallowing. Negative for congestion, ear pain, sinus pressure, sneezing and voice change.   Respiratory:  Negative for cough and shortness of breath.   Cardiovascular:  Negative for chest pain.  Gastrointestinal:  Negative for abdominal pain, diarrhea, nausea and vomiting.  Genitourinary:  Positive for vaginal discharge. Negative for vaginal bleeding and vaginal pain.  Neurological:  Negative for dizziness, light-headedness and headaches.    Physical Exam Triage Vital Signs ED Triage Vitals  Enc Vitals Group     BP 07/29/21 0917 134/85     Pulse Rate 07/29/21 0917 83     Resp 07/29/21 0917 18     Temp 07/29/21 0917 99.3 F (37.4 C)     Temp Source 07/29/21 0917 Oral     SpO2 07/29/21 0917 96 %     Weight --      Height --      Head Circumference --      Peak Flow --      Pain Score 07/29/21 0914 7     Pain Loc --      Pain Edu?  --      Excl. in [place]? --    No data found.  Updated Vital Signs BP 134/85 (BP Location: Right Arm)    Pulse 83    Temp 99.3 F (37.4 C) (Oral)    Resp 18    SpO2 96%   Visual Acuity Right Eye Distance:   Left Eye Distance:   Bilateral Distance:    Right Eye Near:   Left Eye Near:    Bilateral Near:     Physical Exam Vitals reviewed.  Constitutional:      General: She is awake. She is not in acute distress.    Appearance: Normal appearance. She is well-developed. She is not ill-appearing.     Comments: Very pleasant female appears stated age in no acute distress sitting comfortably in exam room  HENT:     Head: Normocephalic and atraumatic.     Right Ear: Tympanic membrane, ear canal and external ear normal. Tympanic membrane is not erythematous or bulging.     Left Ear: Tympanic membrane, ear canal and external ear normal. Tympanic membrane is not erythematous or bulging.     Nose:     Right Sinus: No maxillary sinus tenderness or frontal sinus tenderness.     Left Sinus: No maxillary sinus tenderness or frontal sinus tenderness.     Mouth/Throat:     Pharynx: Uvula midline. Posterior oropharyngeal erythema present. No oropharyngeal exudate.     Comments: Significant erythema present posterior oropharynx.  Small ulcerated lesions noted on buccal mucosa.  No white plaques noted. Cardiovascular:     Rate and Rhythm: Normal rate and regular rhythm.     Heart sounds: Normal heart sounds, S1 normal and S2 normal. No murmur heard. Pulmonary:     Effort: Pulmonary effort is normal.     Breath sounds: Normal breath sounds. No wheezing, rhonchi or rales.     Comments: Clear to auscultation bilaterally Abdominal:     General: Bowel sounds are normal.     Palpations: Abdomen is soft.     Tenderness: There is no abdominal tenderness.  Genitourinary:    Comments: Exam deferred Psychiatric:        Behavior: Behavior is cooperative.     UC Treatments / Results  Labs (all labs  ordered are listed, but only abnormal results are displayed) Labs Reviewed  CULTURE, GROUP A STREP [facility])  POCT RAPID STREP A, ED / UC    EKG   Radiology No results found.  Procedures Procedures (including critical care time)  Medications Ordered in UC Medications - No data to display  Initial Impression / Assessment and Plan / UC Course  I have reviewed the triage vital signs and the nursing notes.  Pertinent labs & imaging results that were available during my care of the patient were reviewed by me and considered in my medical decision making (see chart for details).     Strep testing was obtained per patient request and negative.  Throat culture is pending.  Discussed likely oral thrush related to inhaled corticosteroids as etiology of symptoms.  We will start nystatin 4 times daily.  Vaginal swab and exam were deferred as patient has a history of recurrent yeast infections with similar symptoms.  We will start Diflucan to cover for oral thrush as well as vaginal yeast for total of 3 doses spaced 72 hours apart.  Discussed the importance of rinsing her mouth following use of inhaler and encouraged her to reach out to her primary care provider to consider changing medication given recurrent side effects.  Discussed alarm symptoms that warrant emergent evaluation including difficulty speaking, difficulty swallowing, shortness of breath, swelling of throat/tongue.  Strict return precautions given to which she expressed understanding.  Final Clinical Impressions(s) / UC Diagnoses   Final diagnoses:  Oral thrush  Oral lesion  Mouth pain  Vaginal yeast infection     Discharge Instructions      Your strep test was negative.  I believe that you have yeast contributing to your symptoms.  Start Diflucan once every 3 days for total of 3 doses.  Start nystatin oral suspension 4 times a day for the next week.  Gargle with warm salt water.  I would recommend discussing inhaler use with  your PCP to consider a different medication since you have recurrent symptoms related to use.  If you have any worsening symptoms including inability to swallow, swelling of your throat/tongue, fevers, persistent symptoms despite treatment you need to be seen immediately.  Follow-up with your PCP next week.     ED Prescriptions     Medication Sig Dispense Auth. Provider   fluconazole (DIFLUCAN) 150 MG tablet Take 1 tablet (150 mg total) by mouth every 3 (three) days. 3 tablet Tyran Huser K, PA-C   nystatin (MYCOSTATIN) 100000 UNIT/ML suspension Use as directed 5 mLs (500,000 Units total) in the mouth or throat 4 (four) times daily. 150 mL Redmond Whittley K, PA-C      PDMP not reviewed  this encounter.   Terrilee Croak, PA-C 07/29/21 1043

## 2021-07-31 LAB — CULTURE, GROUP A STREP (THRC)

## 2021-08-13 DIAGNOSIS — I10 Essential (primary) hypertension: Secondary | ICD-10-CM | POA: Diagnosis not present

## 2021-08-13 DIAGNOSIS — J45909 Unspecified asthma, uncomplicated: Secondary | ICD-10-CM | POA: Diagnosis not present

## 2021-08-13 DIAGNOSIS — J449 Chronic obstructive pulmonary disease, unspecified: Secondary | ICD-10-CM | POA: Diagnosis not present

## 2021-08-13 DIAGNOSIS — J45901 Unspecified asthma with (acute) exacerbation: Secondary | ICD-10-CM | POA: Diagnosis not present

## 2021-08-13 DIAGNOSIS — E785 Hyperlipidemia, unspecified: Secondary | ICD-10-CM | POA: Diagnosis not present

## 2021-08-13 DIAGNOSIS — M810 Age-related osteoporosis without current pathological fracture: Secondary | ICD-10-CM | POA: Diagnosis not present

## 2021-08-13 DIAGNOSIS — F322 Major depressive disorder, single episode, severe without psychotic features: Secondary | ICD-10-CM | POA: Diagnosis not present

## 2021-08-13 DIAGNOSIS — J441 Chronic obstructive pulmonary disease with (acute) exacerbation: Secondary | ICD-10-CM | POA: Diagnosis not present

## 2021-08-17 DIAGNOSIS — K1379 Other lesions of oral mucosa: Secondary | ICD-10-CM | POA: Diagnosis not present

## 2021-09-02 DIAGNOSIS — L578 Other skin changes due to chronic exposure to nonionizing radiation: Secondary | ICD-10-CM | POA: Diagnosis not present

## 2021-09-02 DIAGNOSIS — Z23 Encounter for immunization: Secondary | ICD-10-CM | POA: Diagnosis not present

## 2021-09-02 DIAGNOSIS — D229 Melanocytic nevi, unspecified: Secondary | ICD-10-CM | POA: Diagnosis not present

## 2021-09-02 DIAGNOSIS — L821 Other seborrheic keratosis: Secondary | ICD-10-CM | POA: Diagnosis not present

## 2021-09-02 DIAGNOSIS — K120 Recurrent oral aphthae: Secondary | ICD-10-CM | POA: Diagnosis not present

## 2021-09-02 DIAGNOSIS — D1801 Hemangioma of skin and subcutaneous tissue: Secondary | ICD-10-CM | POA: Diagnosis not present

## 2021-09-02 DIAGNOSIS — L814 Other melanin hyperpigmentation: Secondary | ICD-10-CM | POA: Diagnosis not present

## 2021-09-02 DIAGNOSIS — L249 Irritant contact dermatitis, unspecified cause: Secondary | ICD-10-CM | POA: Diagnosis not present

## 2021-09-02 DIAGNOSIS — L239 Allergic contact dermatitis, unspecified cause: Secondary | ICD-10-CM | POA: Diagnosis not present

## 2021-09-16 DIAGNOSIS — K120 Recurrent oral aphthae: Secondary | ICD-10-CM | POA: Diagnosis not present

## 2021-09-16 DIAGNOSIS — L249 Irritant contact dermatitis, unspecified cause: Secondary | ICD-10-CM | POA: Diagnosis not present

## 2021-09-16 DIAGNOSIS — Z23 Encounter for immunization: Secondary | ICD-10-CM | POA: Diagnosis not present

## 2021-09-16 DIAGNOSIS — L239 Allergic contact dermatitis, unspecified cause: Secondary | ICD-10-CM | POA: Diagnosis not present

## 2021-09-16 DIAGNOSIS — L570 Actinic keratosis: Secondary | ICD-10-CM | POA: Diagnosis not present

## 2021-09-26 DIAGNOSIS — J441 Chronic obstructive pulmonary disease with (acute) exacerbation: Secondary | ICD-10-CM | POA: Diagnosis not present

## 2021-09-26 DIAGNOSIS — F330 Major depressive disorder, recurrent, mild: Secondary | ICD-10-CM | POA: Diagnosis not present

## 2021-09-26 DIAGNOSIS — M810 Age-related osteoporosis without current pathological fracture: Secondary | ICD-10-CM | POA: Diagnosis not present

## 2021-09-30 DIAGNOSIS — M810 Age-related osteoporosis without current pathological fracture: Secondary | ICD-10-CM | POA: Diagnosis not present

## 2021-09-30 DIAGNOSIS — Z6823 Body mass index (BMI) 23.0-23.9, adult: Secondary | ICD-10-CM | POA: Diagnosis not present

## 2021-10-02 DIAGNOSIS — J449 Chronic obstructive pulmonary disease, unspecified: Secondary | ICD-10-CM | POA: Diagnosis not present

## 2021-10-02 DIAGNOSIS — F419 Anxiety disorder, unspecified: Secondary | ICD-10-CM | POA: Diagnosis not present

## 2021-10-15 DIAGNOSIS — L239 Allergic contact dermatitis, unspecified cause: Secondary | ICD-10-CM | POA: Diagnosis not present

## 2021-10-15 DIAGNOSIS — K120 Recurrent oral aphthae: Secondary | ICD-10-CM | POA: Diagnosis not present

## 2021-10-15 DIAGNOSIS — Z23 Encounter for immunization: Secondary | ICD-10-CM | POA: Diagnosis not present

## 2021-10-15 DIAGNOSIS — L249 Irritant contact dermatitis, unspecified cause: Secondary | ICD-10-CM | POA: Diagnosis not present

## 2021-10-15 DIAGNOSIS — L570 Actinic keratosis: Secondary | ICD-10-CM | POA: Diagnosis not present

## 2021-10-16 ENCOUNTER — Encounter: Payer: Self-pay | Admitting: Pulmonary Disease

## 2021-10-16 ENCOUNTER — Other Ambulatory Visit: Payer: Self-pay

## 2021-10-16 ENCOUNTER — Ambulatory Visit: Payer: PPO | Admitting: Pulmonary Disease

## 2021-10-16 VITALS — BP 130/78 | HR 89 | Temp 98.6°F | Ht 70.0 in | Wt 161.2 lb

## 2021-10-16 DIAGNOSIS — J441 Chronic obstructive pulmonary disease with (acute) exacerbation: Secondary | ICD-10-CM | POA: Insufficient documentation

## 2021-10-16 DIAGNOSIS — J449 Chronic obstructive pulmonary disease, unspecified: Secondary | ICD-10-CM | POA: Diagnosis not present

## 2021-10-16 MED ORDER — TRELEGY ELLIPTA 100-62.5-25 MCG/ACT IN AEPB: 1.0000 | INHALATION_SPRAY | Freq: Every day | RESPIRATORY_TRACT | 5 refills | Status: DC

## 2021-10-16 NOTE — Patient Instructions (Signed)
COPD ?CONTINUE Trelegy 100-62.5-25 ONE puff ONCE a day ?CONTINUE Albuterol AS NEEDED for shortness of breath or wheezing ? ?Follow-up with me in 3 months ?

## 2021-10-16 NOTE — Progress Notes (Signed)
? ? ?Subjective:  ? ?PATIENT ID: Catherine [name] GENDER: female DOB: 12/30/1945, MRN: [id number] ? ? ?HPI ? ?Chief Complaint  ?Patient presents with  ? Consult  ?  Patient has COPD and was given Breztri from PCP and got thrush and has now been on Anoro and is not doing well on it.   ? ? ?Reason for Visit: New consult for COPD ? ?Catherine [name] is a 76 year old female with COPD, sensory hearing loss and osteopenia who presents as a new consult for COPD management. ? ?Her main symptom is shortness of breath that occurs constantly with rest and activity. Worsens with changes in hot or cold weather. Does improve with rescue inhaler. She is wanting assistance in deciding the appropriate inhaler for her symptoms. Previously on Advair and switched to Symbicort. Cleatrice Burke for one year however had oral thrush/mouth sores. Treated in December 2022. Currently on Anoro and requires Albuterol 3-4 times a week for shortness of breath and chest tightness. No wheezing or cough. ? ?She was seen by her PCP on 10/02/21 and reported that Anoro was not as effective as she hoped. ? ? ?Social History: ?Quit smoking 4 years ago ?36 pack years. ? ?I have personally reviewed patient's past medical/family/social history, allergies, current medications. ? ?Past Medical History:  ?Diagnosis Date  ? Abdominal pain 06/06/2019  ? Anxiety   ? Aortic aneurysm ([place]) 12/08/2018  ? Arthritis   ? HANDS  ? BCC (basal cell carcinoma) 04/07/1995  ? right jawline (CX3, 5FU)  ? BCC (basal cell carcinoma) 05/19/2013  ? right scalp/post (treatment biopsy)  ? Chronic constipation   ? Complication of anesthesia   ? upon waking up from anesthesia patient experienced chest pain - EKG done was negative, patient states "it may have been indigestion"  ? COPD (chronic obstructive pulmonary disease) ([place])   ? DDD (degenerative disc disease), lumbar   ? L2-3/3-4/4-5  ? Dizziness 01/17/2019  ? DOE (dyspnea on exertion) 01/17/2019  ? Dysplastic nevus 12/04/2009  ?  mod-severe left forearm, exc  ? Dysuria   ? Frequency of urination   ? Ganglion cyst of right groin 11/23/2018  ? GERD (gastroesophageal reflux disease)   ? Hearing aid worn   ? Infected hernioplasty mesh ([place]) 01/26/2019  ? Keratosis, actinic 02/18/1991  ? left abdomen  ? Migraine   ? Mild emphysema ([facility])   ? 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  ? Osteopenia 2019  ? TO OSTEOPOROSIS   ? Pelvic pain in female   ? Seroma, post-traumatic ([place]) 04/27/2019  ? Smokers' cough ([place])   ? Thoracic aortic aneurysm (TAA)   ? FOLLOWED BY DR Cyndia Bent  LAST CT CHEST  WAS 6-3 SEE EPIC   ? Urgency of urination   ? h/o INTERSTIAL CYSTITIS...DR. HUMPHRIES/GRAPEY  ? Wears glasses   ?  ? ?Family History  ?Problem Relation Age of Onset  ? Esophageal cancer Mother 70  ? Lung cancer Father 79  ? Breast cancer Maternal Aunt   ?  ? ?Social History  ? ?Occupational History  ? Not on file  ?Tobacco Use  ? Smoking status: Former  ?  Packs/day: 0.75  ?  Years: 48.00  ?  Pack years: 36.00  ?  Types: Cigarettes  ?  Quit date: 09/04/2017  ?  Years since quitting: 4.1  ? Smokeless tobacco: Never  ?Vaping Use  ? Vaping Use: Never used  ?Substance and Sexual Activity  ? Alcohol use: No  ? Drug use: No  ?  Sexual activity: Not on file  ? ? ?Allergies  ?Allergen Reactions  ? Ciprofloxacin Hcl Other (See Comments)  ?  Aortic aneurysm per MD  ? Metoprolol Other (See Comments) and Cough  ?  and wheezing  ? Penicillins Shortness Of Breath, Rash and Other (See Comments)  ?  Has patient had a PCN reaction causing immediate rash, facial/tongue/throat swelling, SOB or lightheadedness with hypotension: Yes ?Has patient had a PCN reaction causing severe rash involving mucus membranes or skin necrosis: No ?Has patient had a PCN reaction that required hospitalization: Yes - MD office ?Has patient had a PCN reaction occurring within the last 10 years: No ?If all of the above answers are "NO", then may proceed with Cephalosporin use. ?  ? Oxycodone Other (See Comments)   ?  Nightmares.  Tolerates tramadol  ? Fosamax [Alendronate]   ?  Other reaction(s): GERD  ? Advair Diskus [Fluticasone-Salmeterol] Anxiety  ?  ? ?Outpatient Medications Prior to Visit  ?Medication Sig Dispense Refill  ? acetaminophen (TYLENOL) 500 MG tablet Take 1,000 mg by mouth at bedtime.    ? albuterol (PROVENTIL HFA;VENTOLIN HFA) 108 (90 Base) MCG/ACT inhaler Inhale 2 puffs into the lungs every 4 (four) hours as needed for wheezing or shortness of breath.    ? CALCIUM CARBONATE ANTACID PO Take 1,000 mg by mouth daily.     ? carboxymethylcellulose (REFRESH PLUS) 0.5 % SOLN Place 2 drops into both eyes 3 (three) times daily.     ? Cholecalciferol (VITAMIN D3 PO) Take 6,000 Units by mouth daily.    ? COVID-19 mRNA bivalent vaccine, Moderna, (MODERNA COVID-19 BIVAL BOOSTER) 50 MCG/0.5ML injection Inject into the muscle. 0.5 mL 0  ? COVID-19 mRNA vaccine, Moderna, 100 MCG/0.5ML injection Inject into the muscle. 0.25 mL 0  ? Cyanocobalamin (VITAMIN B-12 PO) Take 2,500 mcg by mouth daily.    ? denosumab (PROLIA) 60 MG/ML SOSY injection Inject 60 mg into the skin every 6 (six) months.    ? docusate sodium (COLACE) 100 MG capsule Take 200 mg by mouth daily.     ? esomeprazole (NEXIUM) 20 MG capsule Take 20 mg by mouth daily.    ? LORazepam (ATIVAN) 1 MG tablet Take 0.5-1 mg by mouth See admin instructions. Take 0.5 mg in the morning and 1.0 mg at bedtime    ? polyethylene glycol (MIRALAX / GLYCOLAX) packet Take 17 g by mouth daily.     ? senna (SENOKOT) 8.6 MG TABS tablet Take 4 tablets by mouth at bedtime.    ? SUMAtriptan (IMITREX) 50 MG tablet Take 50 mg by mouth every 2 (two) hours as needed for migraine. May repeat in 2 hours if headache persists or recurs.    ? traMADol (ULTRAM) 50 MG tablet Take 50 mg by mouth every 6 (six) hours as needed for moderate pain.    ? BREZTRI AEROSPHERE 160-9-4.8 MCG/ACT AERO Inhale 2 puffs into the lungs 2 (two) times daily.    ? doxycycline (VIBRAMYCIN) 100 MG capsule Take 1  capsule (100 mg total) by mouth 2 (two) times daily. 20 capsule 0  ? escitalopram (LEXAPRO) 5 MG tablet Take 5 mg by mouth daily.    ? fluconazole (DIFLUCAN) 150 MG tablet Take 1 tablet (150 mg total) by mouth every 3 (three) days. 3 tablet 0  ? gabapentin (NEURONTIN) 100 MG capsule Take 100 mg by mouth 1 day or 1 dose.    ? nystatin (MYCOSTATIN) 100000 UNIT/ML suspension Use as directed 5 mLs (500,000  Units total) in the mouth or throat 4 (four) times daily. 150 mL 0  ? traMADol (ULTRAM) 50 MG tablet Take 1-2 tablets (50-100 mg total) by mouth every 6 (six) hours as needed for moderate pain or severe pain. 30 tablet 0  ? ?No facility-administered medications prior to visit.  ? ? ?Review of Systems  ?Constitutional:  Negative for chills, diaphoresis, fever, malaise/fatigue and weight loss.  ?HENT:  Negative for congestion.   ?Respiratory:  Positive for shortness of breath. Negative for cough, hemoptysis, sputum production and wheezing.   ?Cardiovascular:  Negative for chest pain, palpitations and leg swelling.  ? ? ?Objective:  ? ?Vitals:  ? 10/16/21 1527  ?BP: 130/78  ?Pulse: 89  ?Temp: 98.6 ?F (37 ?C)  ?TempSrc: Oral  ?SpO2: 99%  ?Weight: 161 lb 3.2 oz (73.1 kg)  ?Height: '5\' 10"'$  (1.778 m)  ? ?SpO2: 99 % ?O2 Device: None (Room air) ? ?Physical Exam: ?General: Well-appearing, no acute distress ?HENT: [place], AT ?Eyes: EOMI, no scleral icterus ?Respiratory: Clear to auscultation bilaterally.  No crackles, wheezing or rales ?Cardiovascular: RRR, -M/R/G, no JVD ?Extremities:-Edema,-tenderness ?Neuro: AAO x4, CNII-XII grossly intact ?Psych: Normal mood, normal affect ? ?Data Reviewed: ? ?Imaging: ?CT Chest 09/05/20 - Mild centrilobular emphysema. LLL solid 3 mm nodule along left major fissure, stable compared to 01/18/19 CTA ? ?PFT: ?None on file ? ?Labs: ?CBC ?   ?Component Value Date/Time  ? WBC 6.6 01/14/2021 1328  ? RBC 4.45 01/14/2021 1328  ? HGB 13.3 01/14/2021 1328  ? HCT 40.1 01/14/2021 1328  ? PLT 195 01/14/2021 1328   ? MCV 90.1 01/14/2021 1328  ? MCH 29.9 01/14/2021 1328  ? MCHC 33.2 01/14/2021 1328  ? RDW 13.5 01/14/2021 1328  ? LYMPHSABS 1,387 10/17/2019 1054  ? MONOABS 0.6 01/18/2019 1124  ? EOSABS 91 10/17/2019 1054  ?

## 2021-10-21 DIAGNOSIS — N952 Postmenopausal atrophic vaginitis: Secondary | ICD-10-CM | POA: Diagnosis not present

## 2021-10-21 DIAGNOSIS — T283XXA Burn of internal genitourinary organs, initial encounter: Secondary | ICD-10-CM | POA: Diagnosis not present

## 2021-10-21 DIAGNOSIS — N898 Other specified noninflammatory disorders of vagina: Secondary | ICD-10-CM | POA: Diagnosis not present

## 2021-10-22 ENCOUNTER — Encounter: Payer: Self-pay | Admitting: Pulmonary Disease

## 2021-10-28 DIAGNOSIS — R52 Pain, unspecified: Secondary | ICD-10-CM | POA: Diagnosis not present

## 2021-10-28 DIAGNOSIS — U071 COVID-19: Secondary | ICD-10-CM | POA: Diagnosis not present

## 2021-10-28 DIAGNOSIS — R051 Acute cough: Secondary | ICD-10-CM | POA: Diagnosis not present

## 2021-10-28 DIAGNOSIS — J0140 Acute pansinusitis, unspecified: Secondary | ICD-10-CM | POA: Diagnosis not present

## 2021-10-28 DIAGNOSIS — R6883 Chills (without fever): Secondary | ICD-10-CM | POA: Diagnosis not present

## 2021-11-01 DIAGNOSIS — U071 COVID-19: Secondary | ICD-10-CM | POA: Diagnosis not present

## 2021-11-01 DIAGNOSIS — J441 Chronic obstructive pulmonary disease with (acute) exacerbation: Secondary | ICD-10-CM | POA: Diagnosis not present

## 2021-11-08 DIAGNOSIS — J441 Chronic obstructive pulmonary disease with (acute) exacerbation: Secondary | ICD-10-CM | POA: Diagnosis not present

## 2021-11-15 DIAGNOSIS — R051 Acute cough: Secondary | ICD-10-CM | POA: Diagnosis not present

## 2021-11-15 DIAGNOSIS — B379 Candidiasis, unspecified: Secondary | ICD-10-CM | POA: Diagnosis not present

## 2021-11-15 DIAGNOSIS — J189 Pneumonia, unspecified organism: Secondary | ICD-10-CM | POA: Diagnosis not present

## 2021-11-15 DIAGNOSIS — T3695XA Adverse effect of unspecified systemic antibiotic, initial encounter: Secondary | ICD-10-CM | POA: Diagnosis not present

## 2021-11-20 ENCOUNTER — Ambulatory Visit: Payer: PPO | Admitting: Nurse Practitioner

## 2021-11-20 ENCOUNTER — Encounter: Payer: Self-pay | Admitting: Nurse Practitioner

## 2021-11-20 VITALS — BP 110/70 | HR 81 | Temp 98.0°F | Ht 70.0 in | Wt 159.6 lb

## 2021-11-20 DIAGNOSIS — R0609 Other forms of dyspnea: Secondary | ICD-10-CM | POA: Diagnosis not present

## 2021-11-20 DIAGNOSIS — U071 COVID-19: Secondary | ICD-10-CM | POA: Diagnosis not present

## 2021-11-20 DIAGNOSIS — J449 Chronic obstructive pulmonary disease, unspecified: Secondary | ICD-10-CM

## 2021-11-20 DIAGNOSIS — J189 Pneumonia, unspecified organism: Secondary | ICD-10-CM | POA: Diagnosis not present

## 2021-11-20 LAB — NITRIC OXIDE: Nitric Oxide: 9

## 2021-11-20 NOTE — Assessment & Plan Note (Signed)
Positive 3/27.  Slow to recover.  Did discuss that people do have a residual cough for some time after COVID infection.  We will monitor her symptoms closely.   ?

## 2021-11-20 NOTE — Assessment & Plan Note (Signed)
Suspect related to slowly resolving AECOPD and sequela from COVID infection.  Low probability for PE on [place] score. ?

## 2021-11-20 NOTE — Progress Notes (Signed)
? ?'@Patient'$  ID: Catherine [name], female    DOB: 06-17-1946, 76 y.o.   MRN: [id number] ? ?Chief Complaint  ?Patient presents with  ? Follow-up  ?  She was treated 3 times for covid and wants to get checked and was told she may have pneumonia,   ? ? ?Referring provider: ?Jonathon Jordan, MD ? ?HPI: ?76 year old female, former smoker followed for COPD.  She is a patient Dr. Cordelia Pen and last seen in office on 10/16/2021.  Past medical history significant for aortic aneurysm without rupture, neuropathy, DDD, osteopenia, depression with anxiety, migraine headaches. ? ?TEST/EVENTS:  ?09/05/2020 CT chest without contrast: There is atherosclerosis.  There is no LAD present.  Mild centrilobular emphysema.  There is a solid 3 mm left lower lobe pulmonary nodule, considered benign and stable.  There is no acute pulmonary process noted.  Nonaneurysmal thoracic aorta.  Maximal ascending thoracic aorta measuring 3.9 cm. ?11/15/2021 CXR: Unable to view image.  Reviewed read which showed stigmata of emphysema, with increased retrosternal airspace, flattened hemidiaphragms and increased AP diameter.  Lungs are both hyperinflated.  There were coarsened interstitial markings, with no confluent airspace disease noted.  There is no evidence of acute cardiopulmonary disease. ? ?10/16/2021: OV with Dr. Loanne Drilling.  Continues to have shortness of breath that occurs with activity and rest.  Worsens with changes in hot or cold weather.  Does have improvement with rescue inhaler.  Wanted to switch off Anoro as she felt it was not working as well for her.  Did previously try Breztri for a year but had oral thrush/mouth sores.  Retrial with Trelegy 100. ? ?11/20/2021: Today - acute visit ?Patient presents today for increased shortness of breath from baseline. She was diagnosed with COVID 3/27, not treated by antiviral therapy.  She then was diagnosed with left lower lobe pneumonia by her PCP ; although the read on the chest x-ray does not mention this.   She brought in a detailed timeline which is outlined below.  Today, she reports that her breathing is still not her baseline.  Continues to have some chest congestion and occasional wheezing.  She has an associated cough which is occasionally productive with clear sputum.  Does feel like she is starting to feel a little bit better but still gets winded with activity such as climbing her stairs which she used to not.  No recent fevers, calf pain, or lower extremity edema.  She continues on Trelegy 100 daily.  She was prescribed Symbicort twice daily as needed by her PCP, which she has been using and does feel like it helps break up her chest congestion.  She was also been using albuterol nebs 1-2 times a day. ? ?3/27 positive for COVID; treated for sinus/ear infection with doxy ?3/31 chest congestion, tx with 5 days 40 mg of prednisone and added symbicort Twice daily for rescue as well as albuterol neb ?4/7 persistent chest congestion, increased wheezing, treated with levaquin and prednisone burst  ?4/14 wheezing and cough worse, CXR diagnosed by PCP with LLL pna, treated with z pack ? ?Allergies  ?Allergen Reactions  ? Ciprofloxacin Hcl Other (See Comments)  ?  Aortic aneurysm per MD  ? Metoprolol Other (See Comments) and Cough  ?  and wheezing  ? Penicillins Shortness Of Breath, Rash and Other (See Comments)  ?  Has patient had a PCN reaction causing immediate rash, facial/tongue/throat swelling, SOB or lightheadedness with hypotension: Yes ?Has patient had a PCN reaction causing severe rash involving mucus membranes  or skin necrosis: No ?Has patient had a PCN reaction that required hospitalization: Yes - MD office ?Has patient had a PCN reaction occurring within the last 10 years: No ?If all of the above answers are "NO", then may proceed with Cephalosporin use. ?  ? Oxycodone Other (See Comments)  ?  Nightmares.  Tolerates tramadol  ? Advair Diskus [Fluticasone-Salmeterol] Anxiety  ? Fosamax [Alendronate] Other  (See Comments)  ?  Other reaction(s): GERD  ? ? ?Immunization History  ?Administered Date(s) Administered  ? DTaP 05/09/2006  ? Fluad Quad(high Dose 65+) 05/05/2019  ? H1N1 05/26/2008  ? Influenza Split 05/20/2008, 05/29/2009, 04/24/2010, 05/13/2011, 04/20/2012, 05/13/2013  ? Influenza, High Dose Seasonal PF 05/14/2021  ? Influenza,inj,Quad PF,6+ Mos 04/15/2016, 03/27/2017, 05/06/2018, 05/10/2019, 05/08/2020  ? Influenza,inj,quad, With Preservative 04/27/2014, 04/27/2015  ? Influenza-Unspecified 04/04/2014, 05/06/2018  ? Moderna Covid-19 Vaccine Bivalent Booster 70yr & up 05/20/2021  ? Moderna SARS-COV2 Booster Vaccination 06/06/2020, 12/04/2020  ? Moderna Sars-Covid-2 Vaccination 09/02/2019, 09/30/2019, 06/05/2020, 12/03/2020  ? Pneumococcal Conjugate-13 04/04/2014  ? Pneumococcal Polysaccharide-23 06/06/2009  ? Pneumococcal-Unspecified 06/06/2009, 04/04/2014  ? Td 09/10/2018  ? Tdap 09/10/2018  ? Zoster, Live 07/11/2009, 03/12/2021, 08/13/2021  ? ? ?Past Medical History:  ?Diagnosis Date  ? Abdominal pain 06/06/2019  ? Anxiety   ? [place] 12/[place]  ? Arthritis   ? HANDS  ? BCC (basal cell carcinoma) 04/07/1995  ? right jawline (CX3, 5FU)  ? BCC (basal cell carcinoma) 05/19/2013  ? right scalp/post (treatment biopsy)  ? Chronic constipation   ? Complication of anesthesia   ? upon waking up from anesthesia patient experienced chest pain - EKG done was negative, patient states "it may have been indigestion"  ? COPD (chronic obstructive pulmonary disease) ([facility]   ? DDD (degenerative disc disease), lumbar   ? L2-3/3-4/4-5  ? Dizziness 01/17/2019  ? DOE (dyspnea on exertion) 01/17/2019  ? Dysplastic nevus 12/04/2009  ? mod-severe left forearm, exc  ? Dysuria   ? Frequency of urination   ? Ganglion cyst of right groin 11/23/2018  ? GERD (gastroesophageal reflux disease)   ? Hearing aid worn   ? Infected hernioplasty mesh ([facility] 01/26/2019  ? Keratosis, actinic 02/18/1991  ? left abdomen  ? Migraine   ?  Mild emphysema ([facility])   ? 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  ? Osteopenia 2019  ? TO OSTEOPOROSIS   ? Pelvic pain in female   ? Seroma, post-traumatic ([facility] 04/27/2019  ? Sm[place]' [place]   ? Thoracic [place] aneurysm (TAA) ([facility]   ? FOLLOWED BY DR BCyndia Bent LAST CT CHEST  WAS 6-3 SEE EPIC   ? Urgency of urination   ? h/o INTERSTIAL CYSTITIS...DR. HUMPHRIES/GRAPEY  ? Wears glasses   ? ? ?Tobacco History: ?Social History  ? ?Tobacco Use  ?Smoking Status Former  ? Packs/day: 0.75  ? Years: 48.00  ? Pack years: 36.00  ? Types: Cigarettes  ? Quit date: 09/04/2017  ? Years since quitting: 4.2  ?Smokeless Tobacco Never  ? ?Counseling given: Not Answered ? ? ?Outpatient Medications Prior to Visit  ?Medication Sig Dispense Refill  ? acetaminophen (TYLENOL) 500 MG tablet Take 1,000 mg by mouth at bedtime.    ? albuterol (PROVENTIL HFA;VENTOLIN HFA) 108 (90 Base) MCG/ACT inhaler Inhale 2 puffs into the lungs every 4 (four) hours as needed for wheezing or shortness of breath.    ? CALCIUM CARBONATE ANTACID PO Take 1,000 mg by mouth daily.     ? carboxymethylcellulose (REFRESH PLUS) 0.5 % SOLN  Place 2 drops into both eyes 3 (three) times daily.     ? Cholecalciferol (VITAMIN D3 PO) Take 6,000 Units by mouth daily.    ? Cyanocobalamin (VITAMIN B-12 PO) Take 1,200 mcg by mouth daily.    ? denosumab (PROLIA) 60 MG/ML SOSY injection Inject 60 mg into the skin every 6 (six) months.    ? docusate sodium (COLACE) 100 MG capsule Take 200 mg by mouth daily.     ? esomeprazole (NEXIUM) 20 MG capsule Take 20 mg by mouth daily.    ? Fluticasone-Umeclidin-Vilant (TRELEGY ELLIPTA) 100-62.5-25 MCG/ACT AEPB Inhale 1 puff into the lungs daily. 60 each 5  ? LORazepam (ATIVAN) 1 MG tablet Take 0.5-1 mg by mouth See admin instructions. Take 0.5 mg in the morning and 1.0 mg at bedtime    ? polyethylene glycol (MIRALAX / GLYCOLAX) packet Take 17 g by mouth daily.     ? senna (SENOKOT) 8.6 MG TABS tablet Take 2 tablets by mouth at bedtime.    ?  SUMAtriptan (IMITREX) 50 MG tablet Take 50 mg by mouth every 2 (two) hours as needed for migraine. May repeat in 2 hours if headache persists or recurs.    ? traMADol (ULTRAM) 50 MG tablet Take 50 mg by mo

## 2021-11-20 NOTE — Assessment & Plan Note (Signed)
Per PCP, had left lower lobe pneumonia on CXR.  This was not identified on the read from the radiologist and I am unable to review imaging today.  She was treated with Levaquin for this.  No worsening in symptoms since.  Will repeat imaging in 4 to 6 weeks for evaluation for resolution.  Advised her to notify if she develops fevers, productive cough or worsening shortness of breath. ?

## 2021-11-20 NOTE — Patient Instructions (Addendum)
Continue Trelegy 1 puff daily. Brush tongue and rinse mouth afterwards ?Continue Albuterol inhaler 2 puffs or 3 mL neb every 4 hours as needed for shortness of breath or wheezing. Notify if symptoms persist despite rescue inhaler/neb use. ?Continue Nexium 20 mg daily ?Can use Symbicort 2 puffs Twice daily as needed but try to decrease your usage as tolerated to avoid thrush  ? ?Mucinex 600 mg Twice daily for chest congestion ?Flutter valve 2-3 times a day  ? ?Follow up in one month with repeat chest x ray before with Dr. Loanne Drilling or Alanson Aly. If symptoms do not improve or worsen, please contact office for sooner follow up or seek emergency care. ?

## 2021-11-20 NOTE — Assessment & Plan Note (Addendum)
Slowly resolving AECOPD.  She has been treated with 2 rounds of prednisone and 3 different antibiotics since being diagnosed with COVID.  Suspect this is likely of COVID infection, questionable pneumonia and AECOPD.  Would not treat her with any further antibiotics at this time as her cough is minimally productive with clear sputum.  She just completed more prednisone on 4/14.  Does not really notice a significant change in her symptoms.  Hold off on any more prednisone at this point as well.  Advised her to notify us if symptoms do not improve or worsen.  Continue Trelegy.  Advised her to use Symbicort sparingly, as she has a history of thrush and mouth sores with respiratory in past.  Prefer she uses albuterol nebs for shortness of breath, wheezing or chest congestion as her show significant asthma component to her COPD.  FeNO was normal today.  Advised mucociliary clearance with Mucinex and flutter valve. ? ?Patient Instructions  ?Continue Trelegy 1 puff daily. Brush tongue and rinse mouth afterwards ?Continue Albuterol inhaler 2 puffs or 3 mL neb every 4 hours as needed for shortness of breath or wheezing. Notify if symptoms persist despite rescue inhaler/neb use. ?Continue Nexium 20 mg daily ?Can use Symbicort 2 puffs Twice daily as needed but try to decrease your usage as tolerated to avoid thrush  ? ?Mucinex 600 mg Twice daily for chest congestion ?Flutter valve 2-3 times a day  ? ?Follow up in one month with repeat chest x ray before with Dr. Loanne Drilling or Alanson Aly. If symptoms do not improve or worsen, please contact office for sooner follow up or seek emergency care. ? ? ?

## 2021-11-28 DIAGNOSIS — M810 Age-related osteoporosis without current pathological fracture: Secondary | ICD-10-CM | POA: Diagnosis not present

## 2021-12-10 DIAGNOSIS — H6121 Impacted cerumen, right ear: Secondary | ICD-10-CM | POA: Diagnosis not present

## 2021-12-10 DIAGNOSIS — H6981 Other specified disorders of Eustachian tube, right ear: Secondary | ICD-10-CM | POA: Diagnosis not present

## 2021-12-10 DIAGNOSIS — J0100 Acute maxillary sinusitis, unspecified: Secondary | ICD-10-CM | POA: Diagnosis not present

## 2021-12-19 DIAGNOSIS — J441 Chronic obstructive pulmonary disease with (acute) exacerbation: Secondary | ICD-10-CM | POA: Diagnosis not present

## 2021-12-19 DIAGNOSIS — M810 Age-related osteoporosis without current pathological fracture: Secondary | ICD-10-CM | POA: Diagnosis not present

## 2021-12-19 DIAGNOSIS — I10 Essential (primary) hypertension: Secondary | ICD-10-CM | POA: Diagnosis not present

## 2021-12-19 DIAGNOSIS — E785 Hyperlipidemia, unspecified: Secondary | ICD-10-CM | POA: Diagnosis not present

## 2021-12-19 DIAGNOSIS — F322 Major depressive disorder, single episode, severe without psychotic features: Secondary | ICD-10-CM | POA: Diagnosis not present

## 2021-12-20 ENCOUNTER — Other Ambulatory Visit: Payer: Self-pay | Admitting: Nurse Practitioner

## 2021-12-20 DIAGNOSIS — J449 Chronic obstructive pulmonary disease, unspecified: Secondary | ICD-10-CM

## 2021-12-23 ENCOUNTER — Encounter: Payer: Self-pay | Admitting: Adult Health

## 2021-12-23 ENCOUNTER — Ambulatory Visit: Payer: PPO | Admitting: Adult Health

## 2021-12-23 ENCOUNTER — Ambulatory Visit (INDEPENDENT_AMBULATORY_CARE_PROVIDER_SITE_OTHER): Payer: PPO

## 2021-12-23 ENCOUNTER — Ambulatory Visit: Payer: PPO | Admitting: Nurse Practitioner

## 2021-12-23 VITALS — BP 120/70 | HR 96 | Temp 98.2°F | Ht 70.0 in | Wt 160.2 lb

## 2021-12-23 DIAGNOSIS — J449 Chronic obstructive pulmonary disease, unspecified: Secondary | ICD-10-CM | POA: Diagnosis not present

## 2021-12-23 DIAGNOSIS — R0602 Shortness of breath: Secondary | ICD-10-CM | POA: Diagnosis not present

## 2021-12-23 DIAGNOSIS — J441 Chronic obstructive pulmonary disease with (acute) exacerbation: Secondary | ICD-10-CM

## 2021-12-23 LAB — POCT EXHALED NITRIC OXIDE: FeNO level (ppb): 7

## 2021-12-23 MED ORDER — FLUCONAZOLE 150 MG PO TABS: 150.0000 mg | ORAL_TABLET | Freq: Every day | ORAL | 2 refills | Status: DC

## 2021-12-23 NOTE — Patient Instructions (Addendum)
Labs today.  Continue on TRELEGY 1 puff daily , rinse after use Begin Delsym 2 tsp Twice daily  for cough , As needed   Begin Tessalon Three times a day  for cough , as needed.  Continue on Nexium daily  Begin Claritin '10mg'$   At bedtime  for 2 weeks and then As needed   Try sips of water to soothe throat and avoid throat clearing and coughing.  Albuterol inhaler or nebs As needed   CT chest-will call once labs are back to determine which CT .  Follow up with ENT tomorrow.  Follow up Dr. Loanne Drilling in 4-6 weeks and As needed   Please contact office for sooner follow up if symptoms do not improve or worsen or seek emergency care

## 2021-12-23 NOTE — Progress Notes (Signed)
$'@Patient'T$  ID: Catherine [name], female    DOB: Oct 07, 1945, 76 y.o.   MRN: [id number]  Chief Complaint  Patient presents with   Acute Visit    Referring provider: Jonathon Jordan, MD  HPI: 76 year old female former smoker followed for COPD Medical history significant for aortic aneurysm, neuropathy, osteoarthritis, depression with anxiety  TEST/EVENTS :  09/05/2020 CT chest without contrast: There is atherosclerosis.  There is no LAD present.  Mild centrilobular emphysema.  There is a solid 3 mm left lower lobe pulmonary nodule, considered benign and stable.  There is no acute pulmonary process noted.  Nonaneurysmal thoracic aorta.  Maximal ascending thoracic aorta measuring 3.9 cm. 11/15/2021 CXR: Unable to view image.  Reviewed read which showed stigmata of emphysema, with increased retrosternal airspace, flattened hemidiaphragms and increased AP diameter.  Lungs are both hyperinflated.  There were coarsened interstitial markings, with no confluent airspace disease noted.  There is no evidence of acute cardiopulmonary disease.  12/23/2021 Follow up ; COPD exacerbation  Patient presents for a 1 month follow-up.  Patient was seen last visit after a slow to resolve COPD flare after COVID-19 infection in March 2023.  She also had left lower lobe pneumonia.  She was treated with several empiric antibiotics.,  2 courses steroids.  Last visit she was changed from Symbicort to Trelegy. Seen by PCP recently and given another course of antibiotics and steroids . Continues to have ongoing cough, sinus congestion , pressure, drainage and ear fullness. Has ENT appointment tomorrow. Feels short of breath and tightness. Cough is aggravating and will not resolve . Currently not taking any medications for cough or sinus congestion .  Spirometry today shows mild airflow obstruction .  With FEV1 at 84%, ratio 67, FVC 95 %  Exhaled nitric oxide testing is 7 ppb .  Chest x-ray today shows no acute process with clear  lungs.   Allergies  Allergen Reactions   Ciprofloxacin Hcl Other (See Comments)    Aortic aneurysm per MD   Metoprolol Other (See Comments) and Cough    and wheezing   Penicillins Shortness Of Breath, Rash and Other (See Comments)    Has patient had a PCN reaction causing immediate rash, facial/tongue/throat swelling, SOB or lightheadedness with hypotension: Yes Has patient had a PCN reaction causing severe rash involving mucus membranes or skin necrosis: No Has patient had a PCN reaction that required hospitalization: Yes - MD office Has patient had a PCN reaction occurring within the last 10 years: No If all of the above answers are "NO", then may proceed with Cephalosporin use.    Oxycodone Other (See Comments)    Nightmares.  Tolerates tramadol   Advair Diskus [Fluticasone-Salmeterol] Anxiety   Fosamax [Alendronate] Other (See Comments)    Other reaction(s): GERD    Immunization History  Administered Date(s) Administered   DTaP 05/09/2006   Fluad Quad(high Dose 65+) 05/05/2019   H1N1 05/26/2008   Influenza Split 05/20/2008, 05/29/2009, 04/24/2010, 05/13/2011, 04/20/2012, 05/13/2013   Influenza, High Dose Seasonal PF 05/14/2021   Influenza,inj,Quad PF,6+ Mos 04/15/2016, 03/27/2017, 05/06/2018, 05/10/2019, 05/08/2020   Influenza,inj,quad, With Preservative 04/27/2014, 04/27/2015   Influenza-Unspecified 04/04/2014, 05/06/2018   Moderna Covid-19 Vaccine Bivalent Booster 7yr & up 05/20/2021   Moderna SARS-COV2 Booster Vaccination 06/06/2020, 12/04/2020   Moderna Sars-Covid-2 Vaccination 09/02/2019, 09/30/2019, 06/05/2020, 12/03/2020   Pneumococcal Conjugate-13 04/04/2014   Pneumococcal Polysaccharide-23 06/06/2009   Pneumococcal-Unspecified 06/06/2009, 04/04/2014   Td 09/10/2018   Tdap 09/10/2018   Zoster, Live 07/11/2009, 03/12/2021, 08/13/2021  Past Medical History:  Diagnosis Date   Abdominal pain 06/06/2019   Anxiety    Aortic aneurysm ([place]) 12/08/2018    Arthritis    HANDS   BCC (basal cell carcinoma) 04/07/1995   right jawline (CX3, 5FU)   BCC (basal cell carcinoma) 05/19/2013   right scalp/post (treatment biopsy)   Chronic constipation    Complication of anesthesia    upon waking up from anesthesia patient experienced chest pain - EKG done was negative, patient states "it may have been indigestion"   COPD (chronic obstructive pulmonary disease) (HCC)    DDD (degenerative disc disease), lumbar    L2-3/3-4/4-5   Dizziness 01/17/2019   DOE (dyspnea on exertion) 01/17/2019   Dysplastic nevus 12/04/2009   mod-severe left forearm, exc   Dysuria    Frequency of urination    Ganglion cyst of right groin 11/23/2018   GERD (gastroesophageal reflux disease)    Hearing aid worn    Infected hernioplasty mesh ([place]) 01/26/2019   Keratosis, actinic 02/18/1991   left abdomen   Migraine    Mild emphysema ([place])    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   Osteopenia 2019   TO OSTEOPOROSIS    Pelvic pain in female    Seroma, post-traumatic ([place]) 04/27/2019   Smokers' cough ([place])    Thoracic aortic aneurysm (TAA) (HCC)    FOLLOWED BY DR Cyndia Bent  LAST CT CHEST  WAS 6-3 SEE EPIC    Urgency of urination    h/o INTERSTIAL CYSTITIS...DR. HUMPHRIES/GRAPEY   Wears glasses     Tobacco History: Social History   Tobacco Use  Smoking Status Former   Packs/day: 0.75   Years: 48.00   Pack years: 36.00   Types: Cigarettes   Quit date: 09/04/2017   Years since quitting: 4.3  Smokeless Tobacco Never   Counseling given: Not Answered   Outpatient Medications Prior to Visit  Medication Sig Dispense Refill   acetaminophen (TYLENOL) 500 MG tablet Take 1,000 mg by mouth at bedtime.     albuterol (PROVENTIL HFA;VENTOLIN HFA) 108 (90 Base) MCG/ACT inhaler Inhale 2 puffs into the lungs every 4 (four) hours as needed for wheezing or shortness of breath.     CALCIUM CARBONATE ANTACID PO Take 1,000 mg by mouth daily.      carboxymethylcellulose (REFRESH  PLUS) 0.5 % SOLN Place 2 drops into both eyes 3 (three) times daily.      Cholecalciferol (VITAMIN D3 PO) Take 6,000 Units by mouth daily.     Cyanocobalamin (VITAMIN B-12 PO) Take 1,200 mcg by mouth daily.     denosumab (PROLIA) 60 MG/ML SOSY injection Inject 60 mg into the skin every 6 (six) months.     docusate sodium (COLACE) 100 MG capsule Take 200 mg by mouth daily.      esomeprazole (NEXIUM) 20 MG capsule Take 20 mg by mouth daily.     Fluticasone-Umeclidin-Vilant (TRELEGY ELLIPTA) 100-62.5-25 MCG/ACT AEPB Inhale 1 puff into the lungs daily. 60 each 5   LORazepam (ATIVAN) 1 MG tablet Take 0.5-1 mg by mouth See admin instructions. Take 0.5 mg in the morning and 1.0 mg at bedtime     polyethylene glycol (MIRALAX / GLYCOLAX) packet Take 17 g by mouth daily.      senna (SENOKOT) 8.6 MG TABS tablet Take 2 tablets by mouth at bedtime.     SUMAtriptan (IMITREX) 50 MG tablet Take 50 mg by mouth every 2 (two) hours as needed for migraine. May repeat in 2 hours if headache persists or  recurs.     traMADol (ULTRAM) 50 MG tablet Take 50 mg by mouth every 6 (six) hours as needed for moderate pain.     No facility-administered medications prior to visit.     Review of Systems:   Constitutional:   No  weight loss, night sweats,  Fevers, chills, fatigue, or  lassitude.  HEENT:   No headaches,  Difficulty swallowing,  Tooth/dental problems, or  Sore throat,                No sneezing, itching, ear ache,  +nasal congestion, post nasal drip,   CV:  No chest pain,  Orthopnea, PND, swelling in lower extremities, anasarca, dizziness, palpitations, syncope.   GI  No heartburn, indigestion, abdominal pain, nausea, vomiting, diarrhea, change in bowel habits, loss of appetite, bloody stools.   Resp: .  No chest wall deformity  Skin: no rash or lesions.  GU: no dysuria, change in color of urine, no urgency or frequency.  No flank pain, no hematuria   MS:  No joint pain or swelling.  No decreased range  of motion.  No back pain.    Physical Exam    GEN: A/Ox3; pleasant , NAD, well nourished    HEENT:  [place]/AT,  NOSE-clear, THROAT-clear, no lesions, no postnasal drip or exudate noted.   NECK:  Supple w/ fair ROM; no JVD; normal carotid impulses w/o bruits; no thyromegaly or nodules palpated; no lymphadenopathy.    RESP  Clear  P & A; w/o, wheezes/ rales/ or rhonchi. no accessory muscle use, no dullness to percussion  CARD:  RRR, no m/r/g, no peripheral edema, pulses intact, no cyanosis or clubbing.  GI:   Soft & nt; nml bowel sounds; no organomegaly or masses detected.   Musco: Warm bil, no deformities or joint swelling noted.   Neuro: alert, no focal deficits noted.    Skin: Warm, no lesions or rashes    Lab Results:  CBC   BMET   BNP   ProBNP No results found for: PROBNP  Imaging: No results found.        View : No data to display.          Lab Results  Component Value Date   NITRICOXIDE 9 11/20/2021        Assessment & Plan:   No problem-specific Assessment & Plan notes found for this encounter.     Rexene Edison, NP 12/23/2021

## 2021-12-24 ENCOUNTER — Other Ambulatory Visit (INDEPENDENT_AMBULATORY_CARE_PROVIDER_SITE_OTHER): Payer: PPO

## 2021-12-24 DIAGNOSIS — H6981 Other specified disorders of Eustachian tube, right ear: Secondary | ICD-10-CM | POA: Diagnosis not present

## 2021-12-24 DIAGNOSIS — J449 Chronic obstructive pulmonary disease, unspecified: Secondary | ICD-10-CM

## 2021-12-24 DIAGNOSIS — H9201 Otalgia, right ear: Secondary | ICD-10-CM | POA: Diagnosis not present

## 2021-12-24 LAB — BASIC METABOLIC PANEL
BUN: 20 mg/dL (ref 6–23)
CO2: 27 mEq/L (ref 19–32)
Calcium: 9.3 mg/dL (ref 8.4–10.5)
Chloride: 103 mEq/L (ref 96–112)
Creatinine, Ser: 0.87 mg/dL (ref 0.40–1.20)
GFR: 64.88 mL/min (ref >60.00)
Glucose, Bld: 103 mg/dL — ABNORMAL HIGH (ref 70–99)
Potassium: 4 mEq/L (ref 3.5–5.1)
Sodium: 137 mEq/L (ref 135–145)

## 2021-12-24 LAB — CBC WITH DIFFERENTIAL/PLATELET
Basophils Absolute: 0 10*3/uL (ref 0.0–0.1)
Basophils Relative: 0.7 % (ref 0.0–3.0)
Eosinophils Absolute: 0.1 10*3/uL (ref 0.0–0.7)
Eosinophils Relative: 2 % (ref 0.0–5.0)
HCT: 39.8 % (ref 36.0–46.0)
Hemoglobin: 13.3 g/dL (ref 12.0–15.0)
Lymphocytes Relative: 21.8 % (ref 12.0–46.0)
Lymphs Abs: 1.4 10*3/uL (ref 0.7–4.0)
MCHC: 33.4 g/dL (ref 30.0–36.0)
MCV: 88.7 fl (ref 78.0–100.0)
Monocytes Absolute: 0.4 10*3/uL (ref 0.1–1.0)
Monocytes Relative: 7 % (ref 3.0–12.0)
Neutro Abs: 4.3 10*3/uL (ref 1.4–7.7)
Neutrophils Relative %: 68.5 % (ref 43.0–77.0)
Platelets: 215 10*3/uL (ref 150.0–400.0)
RBC: 4.48 Mil/uL (ref 3.87–5.11)
RDW: 14.3 % (ref 11.5–15.5)
WBC: 6.3 10*3/uL (ref 4.0–10.5)

## 2021-12-24 LAB — BRAIN NATRIURETIC PEPTIDE: Pro B Natriuretic peptide (BNP): 12 pg/mL (ref 0.0–100.0)

## 2021-12-24 NOTE — Progress Notes (Signed)
I did not see her yesterday for follow up but Tammy did. Her CXR was routed to me. Appears pneumonia has cleared; no evidence of acute process. Thanks.

## 2021-12-25 LAB — D-DIMER, QUANTITATIVE: D-Dimer, Quant: 0.3 mcg/mL FEU (ref <0.50)

## 2021-12-27 NOTE — Assessment & Plan Note (Signed)
Slow to resolve COPD flare.  After COVID-19 infection and and pneumonia.  In March 2023.-Has completed several courses of antibiotics and steroids with ongoing symptoms. Check labs today including D-dimer. Cough control.  Trigger prevention Follow-up with ENT tomorrow as planned Set up for CT chest.  If D-dimer is positive we will convert to a CTA.  Plan  Patient Instructions  Labs today.  Continue on TRELEGY 1 puff daily , rinse after use Begin Delsym 2 tsp Twice daily  for cough , As needed   Begin Tessalon Three times a day  for cough , as needed.  Continue on Nexium daily  Begin Claritin '10mg'$   At bedtime  for 2 weeks and then As needed   Try sips of water to soothe throat and avoid throat clearing and coughing.  Albuterol inhaler or nebs As needed   CT chest-will call once labs are back to determine which CT .  Follow up with ENT tomorrow.  Follow up Dr. Loanne Drilling in 4-6 weeks and As needed   Please contact office for sooner follow up if symptoms do not improve or worsen or seek emergency care

## 2021-12-31 ENCOUNTER — Encounter: Payer: Self-pay | Admitting: Adult Health

## 2021-12-31 ENCOUNTER — Ambulatory Visit (INDEPENDENT_AMBULATORY_CARE_PROVIDER_SITE_OTHER): Payer: PPO | Admitting: Adult Health

## 2021-12-31 ENCOUNTER — Telehealth: Payer: Self-pay | Admitting: Adult Health

## 2021-12-31 VITALS — BP 120/70 | HR 96 | Ht 70.0 in | Wt 160.2 lb

## 2021-12-31 DIAGNOSIS — J449 Chronic obstructive pulmonary disease, unspecified: Secondary | ICD-10-CM

## 2021-12-31 DIAGNOSIS — J441 Chronic obstructive pulmonary disease with (acute) exacerbation: Secondary | ICD-10-CM | POA: Diagnosis not present

## 2021-12-31 MED ORDER — BENZONATATE 200 MG PO CAPS: 200.0000 mg | ORAL_CAPSULE | Freq: Three times a day (TID) | ORAL | 1 refills | Status: DC | PRN

## 2021-12-31 MED ORDER — PREDNISONE 10 MG PO TABS: ORAL_TABLET | ORAL | 0 refills | Status: DC

## 2021-12-31 MED ORDER — METHYLPREDNISOLONE ACETATE 80 MG/ML IJ SUSP
80.0000 mg | Freq: Once | INTRAMUSCULAR | Status: AC
  Administered 2021-12-31: 80 mg via INTRAMUSCULAR

## 2021-12-31 MED ORDER — ALBUTEROL SULFATE (2.5 MG/3ML) 0.083% IN NEBU: 2.5000 mg | INHALATION_SOLUTION | Freq: Four times a day (QID) | RESPIRATORY_TRACT | 5 refills | Status: DC | PRN

## 2021-12-31 NOTE — Patient Instructions (Addendum)
Prednisone taper over next week.  Continue on TRELEGY 1 puff daily , rinse after use.  Delsym 2 tsp Twice daily  for cough , As needed   Tessalon Three times a day  for cough , as needed.  Continue on Nexium daily  Claritin '10mg'$   At bedtime.  Saline nasal rinses Twice daily   Saline nasal gel At bedtime   Try sips of water to soothe throat and avoid throat clearing and coughing.  Albuterol inhaler or nebs As needed   CT chest  Follow up Dr. Loanne Drilling in 2 weeks and As needed   Please contact office for sooner follow up if symptoms do not improve or worsen or seek emergency care

## 2021-12-31 NOTE — Assessment & Plan Note (Signed)
COPD exacerbation-post COVID-19 infection in March 2023.  Patient has ongoing lingering symptoms.  She has received several courses of antibiotics and steroids.  Changed from Symbicort to Trelegy.  Lab work with CBC, BNP and D-dimer was unrevealing.  Chest x-ray last visit was clear.  CT chest is pending.  O2 saturations have maintained above 90%.  On room air.  Patient is not on oxygen.  Patient may have a component of long-haul COVID with lingering symptoms of fatigue body aches low energy and shortness of breath. We will follow-up on CT chest once resulted. We will give a course of steroids over the next week.  Depo-Medrol 80 mg injection today.  Patient education regarding frequent use of steroids. Continue with cough control regimen  Plan  Patient Instructions  Prednisone taper over next week.  Continue on TRELEGY 1 puff daily , rinse after use.  Delsym 2 tsp Twice daily  for cough , As needed   Tessalon Three times a day  for cough , as needed.  Continue on Nexium daily  Claritin '10mg'$   At bedtime.  Saline nasal rinses Twice daily   Saline nasal gel At bedtime   Try sips of water to soothe throat and avoid throat clearing and coughing.  Albuterol inhaler or nebs As needed   CT chest  Follow up Dr. Loanne Drilling in 2 weeks and As needed   Please contact office for sooner follow up if symptoms do not improve or worsen or seek emergency care

## 2021-12-31 NOTE — Telephone Encounter (Signed)
Spoke with the pt  She states having increased SOB and cough since recent visit  She states she is not wheezing but her cough is "more deep" and she "feels worse than ever" No fever  OV with TP at 4 pm today, ED sooner if gets worse

## 2021-12-31 NOTE — Progress Notes (Signed)
_0  ID: Catherine [name], female    DOB: 11-06-1945, 76 y.o.   MRN: [id number]  Chief Complaint  Patient presents with   Acute Visit    Referring provider: Jonathon Jordan, MD  HPI: 76 year old female former smoker followed for COPD Medical history significant for aortic aneurysm, neuropathy, osteoarthritis, depression with anxiety  TEST/EVENTS :  09/05/2020 CT chest without contrast: There is atherosclerosis.  There is no LAD present.  Mild centrilobular emphysema.  There is a solid 3 mm left lower lobe pulmonary nodule, considered benign and stable.  There is no acute pulmonary process noted.  Nonaneurysmal thoracic aorta.  Maximal ascending thoracic aorta measuring 3.9 cm.  12/31/2021 COPD exacerbation  Patient returns for follow-up.  Patient has been having difficulty with a slow to resolve COPD flare after COVID-19 infection in March 2023.  This was complicated by left lower lobe pneumonia.  She was treated with several empiric antibiotics.  And 2 courses of steroids.  She was also changed from Symbicort to Trelegy.  She had recently been seen by primary care and given a course of Levaquin and steroids.  She has also been seen by ENT as well.  Last visit patient had spirometry that showed mild airflow obstruction with an FEV1 84%, ratio 67 and FVC 95%.  Exhaled nitric oxide testing was 7 ppb.  Chest x-ray showed no acute process with clear lungs.  She was set up for lab work with a CBC, BNP, D-dimer and be met that was unrevealing.  Last visit she was recommended to begin cough control regimen with Delsym and Tessalon.  Unfortunately Tessalon was not sent to the pharmacy.  Patient says Delsym has helped with her cough and is decreased substantially.  However she complains she continues to have intermittent wheezing nasal congestion minimally productive cough and just general fatigue low energy and body aches.  Patient is very frustrated that her symptoms have persisted and she has not back  to baseline.  She denies any chest pain, syncope, palpitations, orthopnea, calf pain.  O2 saturations have maintained 97% at home on room air.  Patient is not on oxygen. CT chest was ordered last visit.  This is still pending.  Allergies  Allergen Reactions   Ciprofloxacin Hcl Other (See Comments)    Aortic aneurysm per MD   Metoprolol Other (See Comments) and Cough    and wheezing   Penicillins Shortness Of Breath, Rash and Other (See Comments)    Has patient had a PCN reaction causing immediate rash, facial/tongue/throat swelling, SOB or lightheadedness with hypotension: Yes Has patient had a PCN reaction causing severe rash involving mucus membranes or skin necrosis: No Has patient had a PCN reaction that required hospitalization: Yes - MD office Has patient had a PCN reaction occurring within the last 10 years: No If all of the above answers are "NO", then may proceed with Cephalosporin use.    Oxycodone Other (See Comments)    Nightmares.  Tolerates tramadol   Advair Diskus [Fluticasone-Salmeterol] Anxiety   Fosamax [Alendronate] Other (See Comments)    Other reaction(s): GERD    Immunization History  Administered Date(s) Administered   DTaP 05/09/2006   Fluad Quad(high Dose 65+) 05/05/2019   H1N1 05/26/2008   Influenza Split 05/20/2008, 05/29/2009, 04/24/2010, 05/13/2011, 04/20/2012, 05/13/2013   Influenza, High Dose Seasonal PF 05/14/2021   Influenza,inj,Quad PF,6+ Mos 04/15/2016, 03/27/2017, 05/06/2018, 05/10/2019, 05/08/2020   Influenza,inj,quad, With Preservative 04/27/2014, 04/27/2015   Influenza-Unspecified 04/04/2014, 05/06/2018   Moderna Covid-19 Vaccine Bivalent Booster  64yr & up 05/20/2021   Moderna SARS-COV2 Booster Vaccination 06/06/2020, 12/04/2020   Moderna Sars-Covid-2 Vaccination 09/02/2019, 09/30/2019, 06/05/2020, 12/03/2020   Pneumococcal Conjugate-13 04/04/2014   Pneumococcal Polysaccharide-23 06/06/2009   Pneumococcal-Unspecified 06/06/2009, 04/04/2014    Td 09/10/2018   Tdap 09/10/2018   Zoster, Live 07/11/2009, 03/12/2021, 08/13/2021    Past Medical History:  Diagnosis Date   Abdominal pain 06/06/2019   Anxiety    [place] 12/08[place]   Arthritis    HANDS   BCC (basal cell carcinoma) 04/07/1995   right jawline (CX3, 5FU)   BCC (basal cell carcinoma) 05/19/2013   right scalp/post (treatment biopsy)   Chronic constipation    Complication of anesthesia    upon waking up from anesthesia patient experienced chest pain - EKG done was negative, patient states "it may have been indigestion"   COPD (chronic obstructive pulmonary disease) [place]    DDD (degenerative disc disease), lumbar    L2-3/3-4/4-5   Dizziness 01/17/2019   DOE (dyspnea on exertion) 01/17/2019   Dysplastic nevus 12/04/2009   mod-severe left forearm, exc   Dysuria    Frequency of urination    Ganglion cyst of right groin 11/23/2018   GERD (gastroesophageal reflux disease)    Hearing aid worn    [place] 01/26/[place]   Keratosis, actinic 02/18/1991   left abdomen   Migraine    Mild emphysema ([facility]    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   Osteopenia 2019   TO OSTEOPOROSIS    Pelvic pain in female    Ser[place], [place]traumatic ([place] 04/27/2019   Sm[place]' cough ([facility]    Thoracic aortic aneurysm (TAA) ([facility])    FOLLOWED BY DR BCyndia Bent LAST CT CHEST  WAS 6-3 SEE EPIC    Urgency of urination    h/o INTERSTIAL CYSTITIS...DR. HUMPHRIES/GRAPEY   Wears glasses     Tobacco History: Social History   Tobacco Use  Smoking Status Former   Packs/day: 0.75   Years: 48.00   Pack years: 36.00   Types: Cigarettes   Quit date: 09/04/2017   Years since quitting: 4.3  Smokeless Tobacco Never   Counseling given: Not Answered   Outpatient Medications Prior to Visit  Medication Sig Dispense Refill   fluconazole (DIFLUCAN) 150 MG tablet Take 1 tablet (150 mg total) by mouth daily. 1 tablet 2   Fluticasone-Umeclidin-Vilant (TRELEGY  ELLIPTA) 100-62.5-25 MCG/ACT AEPB Inhale 1 puff into the lungs daily. 60 each 5   acetaminophen (TYLENOL) 500 MG tablet Take 1,000 mg by mouth at bedtime.     albuterol (PROVENTIL HFA;VENTOLIN HFA) 108 (90 Base) MCG/ACT inhaler Inhale 2 puffs into the lungs every 4 (four) hours as needed for wheezing or shortness of breath.     CALCIUM CARBONATE ANTACID PO Take 1,000 mg by mouth daily.      carboxymethylcellulose (REFRESH PLUS) 0.5 % SOLN Place 2 drops into both eyes 3 (three) times daily.      Cholecalciferol (VITAMIN D3 PO) Take 6,000 Units by mouth daily.     Cyanocobalamin (VITAMIN B-12 PO) Take 1,200 mcg by mouth daily.     denosumab (PROLIA) 60 MG/ML SOSY injection Inject 60 mg into the skin every 6 (six) months.     docusate sodium (COLACE) 100 MG capsule Take 200 mg by mouth daily.      esomeprazole (NEXIUM) 20 MG capsule Take 20 mg by mouth daily.     LORazepam (ATIVAN) 1 MG tablet Take 0.5-1 mg by mouth See admin instructions. Take 0.5 mg  in the morning and 1.0 mg at bedtime     mometasone (NASONEX) 50 MCG/ACT nasal spray Place 2 sprays into the nose daily.     polyethylene glycol (MIRALAX / GLYCOLAX) packet Take 17 g by mouth daily.      senna (SENOKOT) 8.6 MG TABS tablet Take 2 tablets by mouth at bedtime.     SUMAtriptan (IMITREX) 50 MG tablet Take 50 mg by mouth every 2 (two) hours as needed for migraine. May repeat in 2 hours if headache persists or recurs.     traMADol (ULTRAM) 50 MG tablet Take 50 mg by mouth every 6 (six) hours as needed for moderate pain.     No facility-administered medications prior to visit.     Review of Systems:   Constitutional:   No  weight loss, night sweats,  Fevers, chills,  +fatigue, or  lassitude.  HEENT:   No headaches,  Difficulty swallowing,  Tooth/dental problems, or  Sore throat,                No sneezing, itching, ear ache, +nasal congestion, post nasal drip,   CV:  No chest pain,  Orthopnea, PND, swelling in lower extremities,  anasarca, dizziness, palpitations, syncope.   GI  No heartburn, indigestion, abdominal pain, nausea, vomiting, diarrhea, change in bowel habits, loss of appetite, bloody stools.   Resp: IV BMP CBC chest x-ray last no chest wall deformity  Skin: no rash or lesions.  GU: no dysuria, change in color of urine, no urgency or frequency.  No flank pain, no hematuria   MS:  No joint pain or swelling.  No decreased range of motion.  No back pain.    Physical Exam  BP 120/70 (BP Location: Left Arm, Patient Position: Sitting, Cuff Size: Normal)   Pulse 96   Ht $R'5\' 10"'iu$  (1.778 m)   Wt 160 lb 3.2 oz (72.7 kg)   SpO2 97%   BMI 22.99 kg/m   GEN: A/Ox3; pleasant , NAD, well nourished    HEENT:  [place]/AT,  EACs-clear, TMs-wnl, NOSE-clear, THROAT-clear, no lesions, no postnasal drip or exudate noted.   NECK:  Supple w/ fair ROM; no JVD; normal carotid impulses w/o bruits; no thyromegaly or nodules palpated; no lymphadenopathy.    RESP  Clear  P & A; w/o, wheezes/ rales/ or rhonchi. no accessory muscle use, no dullness to percussion  CARD:  RRR, no m/r/g, no peripheral edema, pulses intact, no cyanosis or clubbing.  GI:   Soft & nt; nml bowel sounds; no organomega Leviste y or masses detected.   Musco: Warm bil, no deformities or joint swelling noted.   Neuro: alert, no focal deficits noted.    Skin: Warm, no lesions or rashes    Lab Results:  CBC    Component Value Date/Time   WBC 6.3 12/24/2021 1309   RBC 4.48 12/24/2021 1309   HGB 13.3 12/24/2021 1309   HCT 39.8 12/24/2021 1309   PLT 215.0 12/24/2021 1309   MCV 88.7 12/24/2021 1309   MCH 29.9 01/14/2021 1328   MCHC 33.4 12/24/2021 1309   RDW 14.3 12/24/2021 1309   LYMPHSABS 1.4 12/24/2021 1309   MONOABS 0.4 12/24/2021 1309   EOSABS 0.1 12/24/2021 1309   BASOSABS 0.0 12/24/2021 1309    BMET    Component Value Date/Time   NA 137 12/24/2021 1309   K 4.0 12/24/2021 1309   CL 103 12/24/2021 1309   CO2 27 12/24/2021 1309    GLUCOSE 103 (H) 12/24/2021  1309   BUN 20 12/24/2021 1309   CREATININE 0.87 12/24/2021 1309   CREATININE 0.93 10/17/2019 1054   CALCIUM 9.3 12/24/2021 1309   GFRNONAA 57 (L) 01/14/2021 1328   GFRNONAA 61 10/17/2019 1054   GFRAA 71 10/17/2019 1054    BNP    Component Value Date/Time   BNP 16.9 01/18/2019 1124    ProBNP    Component Value Date/Time   PROBNP 12.0 12/24/2021 1309    Imaging: DG Chest 2 View  Result Date: 12/24/2021 CLINICAL DATA:  Shortness of breath.  COPD. EXAM: CHEST - 2 VIEW COMPARISON:  November 15, 2021 FINDINGS: Hyperinflation of the lungs. The heart, hila, mediastinum, lungs, and pleura are otherwise unremarkable. IMPRESSION: Hyperinflation of the lungs consistent with reported COPD. No acute abnormalities. Electronically Signed   By: Dorise Bullion III M.D.   On: 12/24/2021 09:19    methylPREDNISolone acetate (DEPO-MEDROL) injection 80 mg     Date Action Dose Route User   12/31/2021 1652 Given 80 mg Intramuscular (Right Ventrogluteal) Nolon Stalls, Kimber Relic, RN           View : No data to display.          Lab Results  Component Value Date   NITRICOXIDE 9 11/20/2021        Assessment & Plan:   COPD with acute exacerbation ([place]) COPD exacerbation-post COVID-19 infection in March 2023.  Patient has ongoing lingering symptoms.  She has received several courses of antibiotics and steroids.  Changed from Symbicort to Trelegy.  Lab work with CBC, BNP and D-dimer was unrevealing.  Chest x-ray last visit was clear.  CT chest is pending.  O2 saturations have maintained above 90%.  On room air.  Patient is not on oxygen.  Patient may have a component of long-haul COVID with lingering symptoms of fatigue body aches low energy and shortness of breath. We will follow-up on CT chest once resulted. We will give a course of steroids over the next week.  Depo-Medrol 80 mg injection today.  Patient education regarding frequent use of steroids. Continue with  cough control regimen  Plan  Patient Instructions  Prednisone taper over next week.  Continue on TRELEGY 1 puff daily , rinse after use.  Delsym 2 tsp Twice daily  for cough , As needed   Tessalon Three times a day  for cough , as needed.  Continue on Nexium daily  Claritin 6m  At bedtime.  Saline nasal rinses Twice daily   Saline nasal gel At bedtime   Try sips of water to soothe throat and avoid throat clearing and coughing.  Albuterol inhaler or nebs As needed   CT chest  Follow up Dr. ELoanne Drillingin 2 weeks and As needed   Please contact office for sooner follow up if symptoms do not improve or worsen or seek emergency care         TRexene Edison NP 12/31/2021

## 2022-01-06 ENCOUNTER — Ambulatory Visit (HOSPITAL_BASED_OUTPATIENT_CLINIC_OR_DEPARTMENT_OTHER)
Admission: RE | Admit: 2022-01-06 | Discharge: 2022-01-06 | Disposition: A | Discharge status: Home / Self Care | Payer: PPO | Source: Ambulatory Visit | Attending: Adult Health | Admitting: Adult Health

## 2022-01-06 DIAGNOSIS — M488X4 Other specified spondylopathies, thoracic region: Secondary | ICD-10-CM | POA: Diagnosis not present

## 2022-01-06 DIAGNOSIS — Z8719 Personal history of other diseases of the digestive system: Secondary | ICD-10-CM | POA: Diagnosis not present

## 2022-01-06 DIAGNOSIS — R0989 Other specified symptoms and signs involving the circulatory and respiratory systems: Secondary | ICD-10-CM | POA: Diagnosis not present

## 2022-01-06 DIAGNOSIS — J449 Chronic obstructive pulmonary disease, unspecified: Secondary | ICD-10-CM | POA: Insufficient documentation

## 2022-01-06 DIAGNOSIS — Z8679 Personal history of other diseases of the circulatory system: Secondary | ICD-10-CM | POA: Diagnosis not present

## 2022-01-06 DIAGNOSIS — Z87891 Personal history of nicotine dependence: Secondary | ICD-10-CM | POA: Insufficient documentation

## 2022-01-06 DIAGNOSIS — I10 Essential (primary) hypertension: Secondary | ICD-10-CM | POA: Diagnosis not present

## 2022-01-06 DIAGNOSIS — Z8701 Personal history of pneumonia (recurrent): Secondary | ICD-10-CM | POA: Diagnosis not present

## 2022-01-06 DIAGNOSIS — I700 Atherosclerosis of aorta: Secondary | ICD-10-CM | POA: Diagnosis not present

## 2022-01-06 DIAGNOSIS — J189 Pneumonia, unspecified organism: Secondary | ICD-10-CM | POA: Diagnosis not present

## 2022-01-07 NOTE — Progress Notes (Signed)
Called and spoke with patient, advised of results/recommendations per Rexene Edison NP.  She verbalized understanding and stated that she is feeling much better.  Nothing further needed.

## 2022-01-13 ENCOUNTER — Other Ambulatory Visit: Payer: PPO

## 2022-01-16 ENCOUNTER — Encounter: Payer: Self-pay | Admitting: Pulmonary Disease

## 2022-01-16 ENCOUNTER — Ambulatory Visit: Payer: PPO | Admitting: Pulmonary Disease

## 2022-01-16 VITALS — BP 118/78 | HR 91 | Temp 97.8°F | Ht 70.0 in | Wt 159.8 lb

## 2022-01-16 DIAGNOSIS — U099 Post covid-19 condition, unspecified: Secondary | ICD-10-CM | POA: Diagnosis not present

## 2022-01-16 DIAGNOSIS — J449 Chronic obstructive pulmonary disease, unspecified: Secondary | ICD-10-CM | POA: Diagnosis not present

## 2022-01-16 MED ORDER — TRELEGY ELLIPTA 200-62.5-25 MCG/ACT IN AEPB
1.0000 | INHALATION_SPRAY | Freq: Every day | RESPIRATORY_TRACT | 5 refills | Status: DC
Start: 2022-01-16 — End: 2022-07-30

## 2022-01-16 MED ORDER — LEVALBUTEROL TARTRATE 45 MCG/ACT IN AERO: 2.0000 | INHALATION_SPRAY | RESPIRATORY_TRACT | 2 refills | Status: AC | PRN

## 2022-01-16 MED ORDER — LEVALBUTEROL TARTRATE 45 MCG/ACT IN AERO: 2.0000 | INHALATION_SPRAY | RESPIRATORY_TRACT | 2 refills | Status: DC | PRN

## 2022-01-16 NOTE — Patient Instructions (Addendum)
COPD COVID-19 long hauler --INCREASE Trelegy 200 ONE puff ONCE a day. Consider reducing if symptoms improve --CHANGE from Albuterol to Xopenex for AS NEEDED use   Nasal congestion - worsened since COVID --CONTINUE Zyrtec 10 mg daily --Keep ENT follow-up  Follow-up with me in 3 months

## 2022-01-16 NOTE — Progress Notes (Signed)
Subjective:   PATIENT ID: Catherine [name] GENDER: female DOB: 1945/10/19, MRN: [id number]   HPI  Chief Complaint  Patient presents with   Follow-up    Pt states she has been having problems with her allergies.    Reason for Visit: Follow-up  Catherine [name] is a 76 year old female with COPD, sensory hearing loss and osteopenia who presents for follow-up  Initial consult Her main symptom is shortness of breath that occurs constantly with rest and activity. Worsens with changes in hot or cold weather. Does improve with rescue inhaler. She is wanting assistance in deciding the appropriate inhaler for her symptoms. Previously on Advair and switched to Symbicort. Cleatrice Burke for one year however had oral thrush/mouth sores. Treated in December 2022. Currently on Anoro and requires Albuterol 3-4 times a week for shortness of breath and chest tightness. No wheezing or cough.  She was seen by her PCP on 10/02/21 and reported that Anoro was not as effective as she hoped.  01/16/22 Since our last visit she has had multiple COPD/sinus exacerbations since her COVID-19 infection March 2023. Total of four antibiotics and four prednisone courses.She was transitioned from Symbicort to Trelegy. She currently reports with nasal congestions. Scheduled with ENT wit PA Pietro Cassis. Cough is triggered by post-nasal drainage. Compliant with Trelegy. Breathing issues can be triggered in the heat. Still using albuterol once a day  Social History: Quit smoking 4 years ago 36 pack years.  I have personally reviewed patient's past medical/family/social history, allergies, current medications.  Past Medical History:  Diagnosis Date   Abdominal pain 06/06/2019   Anxiety    Aortic aneurysm ([place]) 12/08/2018   Arthritis    HANDS   BCC (basal cell carcinoma) 04/07/1995   right jawline (CX3, 5FU)   BCC (basal cell carcinoma) 05/19/2013   right scalp/post (treatment biopsy)   Chronic constipation     Complication of anesthesia    upon waking up from anesthesia patient experienced chest pain - EKG done was negative, patient states "it may have been indigestion"   COPD (chronic obstructive pulmonary disease) (HCC)    DDD (degenerative disc disease), lumbar    L2-3/3-4/4-5   Dizziness 01/17/2019   DOE (dyspnea on exertion) 01/17/2019   Dysplastic nevus 12/04/2009   mod-severe left forearm, exc   Dysuria    Frequency of urination    Ganglion cyst of right groin 11/23/2018   GERD (gastroesophageal reflux disease)    Hearing aid worn    Infected hernioplasty mesh ([place]) 01/26/2019   Keratosis, actinic 02/18/1991   left abdomen   Migraine    Mild emphysema ([place])    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   Osteopenia 2019   TO OSTEOPOROSIS    Pelvic pain in female    Seroma, post-traumatic ([place]) 04/27/2019   Smokers' cough ([place])    Thoracic aortic aneurysm (TAA) (HCC)    FOLLOWED BY DR Cyndia Bent  LAST CT CHEST  WAS 6-3 SEE EPIC    Urgency of urination    h/o INTERSTIAL CYSTITIS...DR. HUMPHRIES/GRAPEY   Wears glasses      Family History  Problem Relation Age of Onset   Esophageal cancer Mother 79   Lung cancer Father 4   Breast cancer Maternal Aunt      Social History   Occupational History   Not on file  Tobacco Use   Smoking status: Former    Packs/day: 0.75    Years: 48.00    Total pack years: 36.00  Types: Cigarettes    Quit date: 09/04/2017    Years since quitting: 4.3   Smokeless tobacco: Never  Vaping Use   Vaping Use: Never used  Substance and Sexual Activity   Alcohol use: No   Drug use: No   Sexual activity: Not on file    Allergies  Allergen Reactions   Ciprofloxacin Hcl Other (See Comments)    Aortic aneurysm per MD   Metoprolol Other (See Comments) and Cough    and wheezing   Penicillins Shortness Of Breath, Rash and Other (See Comments)    Has patient had a PCN reaction causing immediate rash, facial/tongue/throat swelling, SOB or  lightheadedness with hypotension: Yes Has patient had a PCN reaction causing severe rash involving mucus membranes or skin necrosis: No Has patient had a PCN reaction that required hospitalization: Yes - MD office Has patient had a PCN reaction occurring within the last 10 years: No If all of the above answers are "NO", then may proceed with Cephalosporin use.    Oxycodone Other (See Comments)    Nightmares.  Tolerates tramadol   Advair Diskus [Fluticasone-Salmeterol] Anxiety   Fosamax [Alendronate] Other (See Comments)    Other reaction(s): GERD     Outpatient Medications Prior to Visit  Medication Sig Dispense Refill   acetaminophen (TYLENOL) 500 MG tablet Take 1,000 mg by mouth at bedtime.     albuterol (PROVENTIL) (2.5 MG/3ML) 0.083% nebulizer solution Take 3 mLs (2.5 mg total) by nebulization every 6 (six) hours as needed for wheezing or shortness of breath. 75 mL 5   CALCIUM CARBONATE ANTACID PO Take 1,000 mg by mouth daily.      carboxymethylcellulose (REFRESH PLUS) 0.5 % SOLN Place 2 drops into both eyes 3 (three) times daily.      Cholecalciferol (VITAMIN D3 PO) Take 6,000 Units by mouth daily.     Cyanocobalamin (VITAMIN B-12 PO) Take 1,200 mcg by mouth daily.     denosumab (PROLIA) 60 MG/ML SOSY injection Inject 60 mg into the skin every 6 (six) months.     docusate sodium (COLACE) 100 MG capsule Take 200 mg by mouth daily.      esomeprazole (NEXIUM) 20 MG capsule Take 20 mg by mouth daily.     LORazepam (ATIVAN) 1 MG tablet Take 0.5-1 mg by mouth See admin instructions. Take 0.5 mg in the morning and 1.0 mg at bedtime     mometasone (NASONEX) 50 MCG/ACT nasal spray Place 2 sprays into the nose daily.     polyethylene glycol (MIRALAX / GLYCOLAX) packet Take 17 g by mouth daily.      senna (SENOKOT) 8.6 MG TABS tablet Take 2 tablets by mouth at bedtime.     SUMAtriptan (IMITREX) 50 MG tablet Take 50 mg by mouth every 2 (two) hours as needed for migraine. May repeat in 2 hours if  headache persists or recurs.     traMADol (ULTRAM) 50 MG tablet Take 50 mg by mouth every 6 (six) hours as needed for moderate pain.     albuterol (PROVENTIL HFA;VENTOLIN HFA) 108 (90 Base) MCG/ACT inhaler Inhale 2 puffs into the lungs every 4 (four) hours as needed for wheezing or shortness of breath.     Fluticasone-Umeclidin-Vilant (TRELEGY ELLIPTA) 100-62.5-25 MCG/ACT AEPB Inhale 1 puff into the lungs daily. 60 each 5   benzonatate (TESSALON) 200 MG capsule Take 1 capsule (200 mg total) by mouth 3 (three) times daily as needed for cough. 30 capsule 1   fluconazole (DIFLUCAN) 150 MG  tablet Take 1 tablet (150 mg total) by mouth daily. 1 tablet 2   predniSONE (DELTASONE) 10 MG tablet 4 tabs for 2 days, then 3 tabs for 2 days, 2 tabs for 2 days, then 1 tab for 2 days, then stop 20 tablet 0   No facility-administered medications prior to visit.    Review of Systems  Constitutional:  Negative for chills, diaphoresis, fever, malaise/fatigue and weight loss.  HENT:  Positive for congestion.   Respiratory:  Positive for cough and shortness of breath. Negative for hemoptysis, sputum production and wheezing.   Cardiovascular:  Negative for chest pain, palpitations and leg swelling.     Objective:   Vitals:   01/16/22 1327  BP: 118/78  Pulse: 91  Temp: 97.8 F (36.6 C)  TempSrc: Oral  SpO2: 96%  Weight: 159 lb 12.8 oz (72.5 kg)  Height: '5\' 10"'$  (1.778 m)   SpO2: 96 % (RA) O2 Device: None (Room air)  Physical Exam: General: Well-appearing, no acute distress HENT: [place], AT Eyes: EOMI, no scleral icterus Respiratory: Clear to auscultation bilaterally.  No crackles, wheezing or rales Cardiovascular: RRR, -M/R/G, no JVD Extremities:-Edema,-tenderness Neuro: AAO x4, CNII-XII grossly intact Psych: Normal mood, normal affect  Data Reviewed:  Imaging: CT Chest 09/05/20 - Mild centrilobular emphysema. LLL solid 3 mm nodule along left major fissure, stable compared to 01/18/19 CTA CT Chest  01/16/22 - Mild centrilobular emphysema. Unchanged mild linear scarring in RML. Stable LLL 3 mm solid nodule. Unchanged thoracic lesions   PFT: None on file  Labs: CBC    Component Value Date/Time   WBC 6.3 12/24/2021 1309   RBC 4.48 12/24/2021 1309   HGB 13.3 12/24/2021 1309   HCT 39.8 12/24/2021 1309   PLT 215.0 12/24/2021 1309   MCV 88.7 12/24/2021 1309   MCH 29.9 01/14/2021 1328   MCHC 33.4 12/24/2021 1309   RDW 14.3 12/24/2021 1309   LYMPHSABS 1.4 12/24/2021 1309   MONOABS 0.4 12/24/2021 1309   EOSABS 0.1 12/24/2021 1309   BASOSABS 0.0 12/24/2021 1309   Absolute eos 12/24/21 -100    Assessment & Plan:   Discussion: 76 year old female with COPD, sensory hearing loss and osteopenia who presents for follow-up. Dx with COVID in 10/2021 and has had persistent symptoms since then requiring antibiotics and steroids x 4. Will re-trial higher dose of triple therapy. Discussed clinical course and management of COPD including bronchodilator regimen and action plan for exacerbation. Discussed chronic macrolide however will defer as her symptoms are likely related to covid long hauler and patient prefers not to be on antibiotic for risk of yeast infections.  COPD COVID-19 long hauler --INCREASE Trelegy 200 ONE puff ONCE a day. Consider reducing if symptoms improve --CHANGE from Albuterol to Xopenex for AS NEEDED use   Nasal congestion - worsened since COVID --CONTINUE Zyrtec 10 mg daily --Keep ENT follow-up  Health Maintenance Immunization History  Administered Date(s) Administered   DTaP 05/09/2006   Fluad Quad(high Dose 65+) 05/05/2019   H1N1 05/26/2008   Influenza Split 05/20/2008, 05/29/2009, 04/24/2010, 05/13/2011, 04/20/2012, 05/13/2013   Influenza, High Dose Seasonal PF 05/14/2021   Influenza,inj,Quad PF,6+ Mos 04/15/2016, 03/27/2017, 05/06/2018, 05/10/2019, 05/08/2020   Influenza,inj,quad, With Preservative 04/27/2014, 04/27/2015   Influenza-Unspecified 04/04/2014,  05/06/2018   Moderna Covid-19 Vaccine Bivalent Booster 56yr & up 05/20/2021   Moderna SARS-COV2 Booster Vaccination 06/06/2020, 12/04/2020   Moderna Sars-Covid-2 Vaccination 09/02/2019, 09/30/2019, 06/05/2020, 12/03/2020   Pneumococcal Conjugate-13 04/04/2014   Pneumococcal Polysaccharide-23 06/06/2009   Pneumococcal-Unspecified 06/06/2009, 04/04/2014  Td 09/10/2018   Tdap 09/10/2018   Zoster, Live 07/11/2009, 03/12/2021, 08/13/2021   CT Lung Screen- discuss at next visit  No orders of the defined types were placed in this encounter.  Meds ordered this encounter  Medications   Fluticasone-Umeclidin-Vilant (TRELEGY ELLIPTA) 200-62.5-25 MCG/ACT AEPB    Sig: Inhale 1 puff into the lungs daily at 2 PM.    Dispense:  60 each    Refill:  5   DISCONTD: levalbuterol (XOPENEX HFA) 45 MCG/ACT inhaler    Sig: Inhale 2 puffs into the lungs every 4 (four) hours as needed for wheezing.    Dispense:  1 each    Refill:  2   levalbuterol (XOPENEX HFA) 45 MCG/ACT inhaler    Sig: Inhale 2 puffs into the lungs every 4 (four) hours as needed for wheezing.    Dispense:  1 each    Refill:  2    Generic preferred    Return in about 3 months (around 04/18/2022).  I have spent a total time of 30-minutes on the day of the appointment including chart review, data review, collecting history, coordinating care and discussing medical diagnosis and plan with the patient/family. Past medical history, allergies, medications were reviewed. Pertinent imaging, labs and tests included in this note have been reviewed and interpreted independently by me.  [place], MD [facility] 01/16/2022 1:59 PM  Office Number [phone]

## 2022-01-24 DIAGNOSIS — H9201 Otalgia, right ear: Secondary | ICD-10-CM | POA: Diagnosis not present

## 2022-01-24 DIAGNOSIS — H6981 Other specified disorders of Eustachian tube, right ear: Secondary | ICD-10-CM | POA: Diagnosis not present

## 2022-02-03 ENCOUNTER — Other Ambulatory Visit: Payer: Self-pay | Admitting: Family Medicine

## 2022-02-03 DIAGNOSIS — Z1231 Encounter for screening mammogram for malignant neoplasm of breast: Secondary | ICD-10-CM

## 2022-02-19 DIAGNOSIS — M26621 Arthralgia of right temporomandibular joint: Secondary | ICD-10-CM | POA: Diagnosis not present

## 2022-02-19 DIAGNOSIS — H903 Sensorineural hearing loss, bilateral: Secondary | ICD-10-CM | POA: Diagnosis not present

## 2022-02-19 DIAGNOSIS — H9201 Otalgia, right ear: Secondary | ICD-10-CM | POA: Diagnosis not present

## 2022-03-07 ENCOUNTER — Ambulatory Visit
Admission: RE | Admit: 2022-03-07 | Discharge: 2022-03-07 | Disposition: A | Discharge status: Home / Self Care | Payer: PPO | Source: Ambulatory Visit

## 2022-03-07 DIAGNOSIS — Z1231 Encounter for screening mammogram for malignant neoplasm of breast: Secondary | ICD-10-CM

## 2022-03-11 DIAGNOSIS — R059 Cough, unspecified: Secondary | ICD-10-CM | POA: Diagnosis not present

## 2022-03-11 DIAGNOSIS — J0100 Acute maxillary sinusitis, unspecified: Secondary | ICD-10-CM | POA: Diagnosis not present

## 2022-04-14 ENCOUNTER — Ambulatory Visit: Payer: PPO | Admitting: Adult Health

## 2022-04-14 ENCOUNTER — Encounter: Payer: Self-pay | Admitting: Adult Health

## 2022-04-14 ENCOUNTER — Telehealth: Payer: Self-pay | Admitting: *Deleted

## 2022-04-14 DIAGNOSIS — J449 Chronic obstructive pulmonary disease, unspecified: Secondary | ICD-10-CM | POA: Diagnosis not present

## 2022-04-14 DIAGNOSIS — J310 Chronic rhinitis: Secondary | ICD-10-CM

## 2022-04-14 DIAGNOSIS — U099 Post covid-19 condition, unspecified: Secondary | ICD-10-CM

## 2022-04-14 MED ORDER — PREDNISONE 10 MG PO TABS: ORAL_TABLET | ORAL | 0 refills | Status: DC

## 2022-04-14 NOTE — Assessment & Plan Note (Signed)
Recurrent flare with ongoing chronic rhinitis symptoms. We will treat with short course of prednisone.  Long discussion with patient regarding frequent steroid use and potential complications.  Hold on antibiotics at this time.  If symptoms persist on next visit consider CT sinus. Encouraged on warm compresses to her sinuses.  Change Claritin to Allegra.  Saline nasal spray twice daily.  Plan  Patient Instructions  Prednisone taper over next week.  Continue on TRELEGY 1 puff daily , rinse after use.  Delsym 2 tsp Twice daily  for cough , As needed   Tessalon Three times a day  for cough , as needed.  Continue on Nexium daily  Change to Allegra '180mg'$  daily  Saline nasal spray Twice daily   Saline nasal gel At bedtime   Try sips of water to soothe throat and avoid throat clearing and coughing.  Xopenex inhaler or nebs As needed   Follow up Dr. Loanne Drilling or Mulki Roesler in 4-6 weeks and As needed   Please contact office for sooner follow up if symptoms do not improve or worsen or seek emergency care

## 2022-04-14 NOTE — Assessment & Plan Note (Signed)
Chronic rhinitis with ongoing symptoms.  Change Claritin to Allegra.  Continue on nasal sprays.  Saline nasal spray and gel If symptoms persist consider CT sinus

## 2022-04-14 NOTE — Assessment & Plan Note (Signed)
Ongoing chronic symptoms with fatigue, decreased endurance and stamina.  Continue with activity as tolerated.  Follow-up with primary care provider

## 2022-04-14 NOTE — Patient Instructions (Addendum)
Prednisone taper over next week.  Continue on TRELEGY 1 puff daily , rinse after use.  Delsym 2 tsp Twice daily  for cough , As needed   Tessalon Three times a day  for cough , as needed.  Continue on Nexium daily  Change to Allegra '180mg'$  daily  Saline nasal spray Twice daily   Saline nasal gel At bedtime   Try sips of water to soothe throat and avoid throat clearing and coughing.  Xopenex inhaler or nebs As needed   Follow up Dr. Loanne Drilling or Britania Shreeve in 4-6 weeks and As needed   Please contact office for sooner follow up if symptoms do not improve or worsen or seek emergency care

## 2022-04-14 NOTE — Progress Notes (Signed)
$'@Patient'M$  ID: Catherine [name], female    DOB: 1945/11/17, 76 y.o.   MRN: [id number]  Chief Complaint  Patient presents with   Follow-up    Referring provider: Jonathon Jordan, MD  HPI: 76 year old female former smoker followed for COPD Medical history significant for aortic aneurysm, neuropathy, osteoarthritis and depression with anxiety COVID-19 infection March 5701, complicated by left lower lobe pneumonia and slow to resolve COPD exacerbation    TEST/EVENTS :  09/05/2020 CT chest without contrast: There is atherosclerosis.  There is no LAD present.  Mild centrilobular emphysema.  There is a solid 3 mm left lower lobe pulmonary nodule, considered benign and stable.  There is no acute pulmonary process noted.  Nonaneurysmal thoracic aorta.  Maximal ascending thoracic aorta measuring 3.9 cm.  12/2021 spirometry that showed mild airflow obstruction with an FEV1 84%, ratio 67 and FVC 95%.  Exhaled nitric oxide testing was 7 ppb.   Dec 24, 2021 absolute eosinophil count 100  CT chest January 06, 2022 showed resolution of pneumonia, mild linear scarring in the right middle lobe, no suspicious nodules.  Negative for consolidation.  And mild emphysema.   04/14/2022 Acute OV : COPD  Patient presents for an acute office visit.  She complains over the last several weeks she has had increased cough, congestion, sinus pain and pressure and ear fullness.  Patient denies any discolored mucus, fever, chest pain orthopnea or increased leg swelling.  She remains on Trelegy inhaler daily.  Patient has been seen by ENT felt that her ear and sinus pain and symptoms were related to TMJ.  Patient feels that she does not have TMJ.  That she has more nasal stuffiness and ear fullness.  She is using Claritin, Nasonex, Astelin nasal spray.  Despite all this she says she continues to have daily symptoms.  Patient has been treated with several antibiotics and steroid courses over the last several months.  She says she gets  better briefly but symptoms return.    Allergies  Allergen Reactions   Ciprofloxacin Hcl Other (See Comments)    Aortic aneurysm per MD   Metoprolol Other (See Comments) and Cough    and wheezing   Penicillins Shortness Of Breath, Rash and Other (See Comments)    Has patient had a PCN reaction causing immediate rash, facial/tongue/throat swelling, SOB or lightheadedness with hypotension: Yes Has patient had a PCN reaction causing severe rash involving mucus membranes or skin necrosis: No Has patient had a PCN reaction that required hospitalization: Yes - MD office Has patient had a PCN reaction occurring within the last 10 years: No If all of the above answers are "NO", then may proceed with Cephalosporin use.    Oxycodone Other (See Comments)    Nightmares.  Tolerates tramadol   Advair Diskus [Fluticasone-Salmeterol] Anxiety   Fosamax [Alendronate] Other (See Comments)    Other reaction(s): GERD    Immunization History  Administered Date(s) Administered   DTaP 05/09/2006   Fluad Quad(high Dose 65+) 05/05/2019   H1N1 05/26/2008   Influenza Split 05/20/2008, 05/29/2009, 04/24/2010, 05/13/2011, 04/20/2012, 05/13/2013   Influenza, High Dose Seasonal PF 05/14/2021   Influenza,inj,Quad PF,6+ Mos 04/15/2016, 03/27/2017, 05/06/2018, 05/10/2019, 05/08/2020   Influenza,inj,quad, With Preservative 04/27/2014, 04/27/2015   Influenza-Unspecified 04/04/2014, 05/06/2018   Moderna Covid-19 Vaccine Bivalent Booster 62yr & up 05/20/2021   Moderna SARS-COV2 Booster Vaccination 06/06/2020, 12/04/2020   Moderna Sars-Covid-2 Vaccination 09/02/2019, 09/30/2019, 06/05/2020, 12/03/2020   Pneumococcal Conjugate-13 04/04/2014   Pneumococcal Polysaccharide-23 06/06/2009   Pneumococcal-Unspecified 06/06/2009,  04/04/2014   Td 09/10/2018   Tdap 09/10/2018   Zoster, Live 07/11/2009, 03/12/2021, 08/13/2021    Past Medical History:  Diagnosis Date   Abdominal pain 06/06/2019   Anxiety    Aortic  aneurysm ([place]) 12/08/2018   Arthritis    HANDS   BCC (basal cell carcinoma) 04/07/1995   right jawline (CX3, 5FU)   BCC (basal cell carcinoma) 05/19/2013   right scalp/post (treatment biopsy)   Chronic constipation    Complication of anesthesia    upon waking up from anesthesia patient experienced chest pain - EKG done was negative, patient states "it may have been indigestion"   COPD (chronic obstructive pulmonary disease) (HCC)    DDD (degenerative disc disease), lumbar    L2-3/3-4/4-5   Dizziness 01/17/2019   DOE (dyspnea on exertion) 01/17/2019   Dysplastic nevus 12/04/2009   mod-severe left forearm, exc   Dysuria    Frequency of urination    Ganglion cyst of right groin 11/23/2018   GERD (gastroesophageal reflux disease)    Hearing aid worn    Infected hernioplasty mesh ([place]) 01/26/2019   Keratosis, actinic 02/18/1991   left abdomen   Migraine    Mild emphysema ([place])    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   Osteopenia 2019   TO OSTEOPOROSIS    Pelvic pain in female    Seroma, post-traumatic ([place]) 04/27/2019   Smokers' cough ([place])    Thoracic aortic aneurysm (TAA) (HCC)    FOLLOWED BY DR Cyndia Bent  LAST CT CHEST  WAS 6-3 SEE EPIC    Urgency of urination    h/o INTERSTIAL CYSTITIS...DR. HUMPHRIES/GRAPEY   Wears glasses     Tobacco History: Social History   Tobacco Use  Smoking Status Former   Packs/day: 0.75   Years: 48.00   Total pack years: 36.00   Types: Cigarettes   Quit date: 09/04/2017   Years since quitting: 4.6  Smokeless Tobacco Never   Counseling given: Not Answered   Outpatient Medications Prior to Visit  Medication Sig Dispense Refill   acetaminophen (TYLENOL) 500 MG tablet Take 1,000 mg by mouth at bedtime.     albuterol (PROVENTIL) (2.5 MG/3ML) 0.083% nebulizer solution Take 3 mLs (2.5 mg total) by nebulization every 6 (six) hours as needed for wheezing or shortness of breath. 75 mL 5   Azelastine HCl (ASTEPRO NA) Place into the nose at  bedtime.     CALCIUM CARBONATE ANTACID PO Take 1,000 mg by mouth daily.      carboxymethylcellulose (REFRESH PLUS) 0.5 % SOLN Place 2 drops into both eyes 3 (three) times daily.      cetirizine (ZYRTEC) 10 MG tablet Take 10 mg by mouth daily.     Cholecalciferol (VITAMIN D3 PO) Take 6,000 Units by mouth daily.     Cyanocobalamin (VITAMIN B-12 PO) Take 1,200 mcg by mouth daily.     denosumab (PROLIA) 60 MG/ML SOSY injection Inject 60 mg into the skin every 6 (six) months.     docusate sodium (COLACE) 100 MG capsule Take 200 mg by mouth daily.      esomeprazole (NEXIUM) 20 MG capsule Take 20 mg by mouth daily.     Fluticasone-Umeclidin-Vilant (TRELEGY ELLIPTA) 200-62.5-25 MCG/ACT AEPB Inhale 1 puff into the lungs daily at 2 PM. 60 each 5   levalbuterol (XOPENEX HFA) 45 MCG/ACT inhaler Inhale 2 puffs into the lungs every 4 (four) hours as needed for wheezing. 1 each 2   loratadine (CLARITIN) 5 MG chewable tablet Chew 5 mg by mouth  daily.     LORazepam (ATIVAN) 1 MG tablet Take 0.5-1 mg by mouth See admin instructions. Take 0.5 mg in the morning and 1.0 mg at bedtime     mometasone (NASONEX) 50 MCG/ACT nasal spray Place 2 sprays into the nose daily.     polyethylene glycol (MIRALAX / GLYCOLAX) packet Take 17 g by mouth daily.      senna (SENOKOT) 8.6 MG TABS tablet Take 2 tablets by mouth at bedtime.     SUMAtriptan (IMITREX) 50 MG tablet Take 50 mg by mouth every 2 (two) hours as needed for migraine. May repeat in 2 hours if headache persists or recurs.     traMADol (ULTRAM) 50 MG tablet Take 50 mg by mouth every 6 (six) hours as needed for moderate pain.     No facility-administered medications prior to visit.     Review of Systems:   Constitutional:   No  weight loss, night sweats,  Fevers, chills,  +fatigue, or  lassitude.  HEENT:   No headaches,  Difficulty swallowing,  Tooth/dental problems, or  Sore throat,                No sneezing, itching, ear ache,  +nasal congestion, post nasal  drip,   CV:  No chest pain,  Orthopnea, PND, swelling in lower extremities, anasarca, dizziness, palpitations, syncope.   GI  No heartburn, indigestion, abdominal pain, nausea, vomiting, diarrhea, change in bowel habits, loss of appetite, bloody stools.   Resp: .  No chest wall deformity  Skin: no rash or lesions.  GU: no dysuria, change in color of urine, no urgency or frequency.  No flank pain, no hematuria   MS:  No joint pain or swelling.  No decreased range of motion.  No back pain.    Physical Exam  BP 112/76 (BP Location: Left Arm, Cuff Size: Normal)   Pulse 95   Temp 98.7 F (37.1 C)   Ht '5\' 10"'$  (1.778 m)   Wt 160 lb (72.6 kg)   SpO2 97%   BMI 22.96 kg/m   GEN: A/Ox3; pleasant , NAD, well nourished    HEENT:  [place]/AT,  EACs-clear, TMs-wnl, NOSE-clear, THROAT-clear, no lesions, no postnasal drip or exudate noted.   NECK:  Supple w/ fair ROM; no JVD; normal carotid impulses w/o bruits; no thyromegaly or nodules palpated; no lymphadenopathy.    RESP  Clear  P & A; w/o, wheezes/ rales/ or rhonchi. no accessory muscle use, no dullness to percussion  CARD:  RRR, no m/r/g, no peripheral edema, pulses intact, no cyanosis or clubbing.  GI:   Soft & nt; nml bowel sounds; no organomegaly or masses detected.   Musco: Warm bil, no deformities or joint swelling noted.   Neuro: alert, no focal deficits noted.    Skin: Warm, no lesions or rashes    Lab Results:  CBC    Component Value Date/Time   WBC 6.3 12/24/2021 1309   RBC 4.48 12/24/2021 1309   HGB 13.3 12/24/2021 1309   HCT 39.8 12/24/2021 1309   PLT 215.0 12/24/2021 1309   MCV 88.7 12/24/2021 1309   MCH 29.9 01/14/2021 1328   MCHC 33.4 12/24/2021 1309   RDW 14.3 12/24/2021 1309   LYMPHSABS 1.4 12/24/2021 1309   MONOABS 0.4 12/24/2021 1309   EOSABS 0.1 12/24/2021 1309   BASOSABS 0.0 12/24/2021 1309    BMET    Component Value Date/Time   NA 137 12/24/2021 1309   K 4.0 12/24/2021 1309  CL 103  12/24/2021 1309   CO2 27 12/24/2021 1309   GLUCOSE 103 (H) 12/24/2021 1309   BUN 20 12/24/2021 1309   CREATININE 0.87 12/24/2021 1309   CREATININE 0.93 10/17/2019 1054   CALCIUM 9.3 12/24/2021 1309   GFRNONAA 57 (L) 01/14/2021 1328   GFRNONAA 61 10/17/2019 1054   GFRAA 71 10/17/2019 1054    BNP    Component Value Date/Time   BNP 16.9 01/18/2019 1124    ProBNP    Component Value Date/Time   PROBNP 12.0 12/24/2021 1309    Imaging: No results found.        No data to display          Lab Results  Component Value Date   NITRICOXIDE 9 11/20/2021        Assessment & Plan:   Chronic obstructive pulmonary disease (HCC) Recurrent flare with ongoing chronic rhinitis symptoms. We will treat with short course of prednisone.  Long discussion with patient regarding frequent steroid use and potential complications.  Hold on antibiotics at this time.  If symptoms persist on next visit consider CT sinus. Encouraged on warm compresses to her sinuses.  Change Claritin to Allegra.  Saline nasal spray twice daily.  Plan  Patient Instructions  Prednisone taper over next week.  Continue on TRELEGY 1 puff daily , rinse after use.  Delsym 2 tsp Twice daily  for cough , As needed   Tessalon Three times a day  for cough , as needed.  Continue on Nexium daily  Change to Allegra '180mg'$  daily  Saline nasal spray Twice daily   Saline nasal gel At bedtime   Try sips of water to soothe throat and avoid throat clearing and coughing.  Xopenex inhaler or nebs As needed   Follow up Dr. Loanne Drilling or Juancarlos Crescenzo in 4-6 weeks and As needed   Please contact office for sooner follow up if symptoms do not improve or worsen or seek emergency care       COVID-19 long hauler Ongoing chronic symptoms with fatigue, decreased endurance and stamina.  Continue with activity as tolerated.  Follow-up with primary care provider  Chronic rhinitis Chronic rhinitis with ongoing symptoms.  Change Claritin  to Allegra.  Continue on nasal sprays.  Saline nasal spray and gel If symptoms persist consider CT sinus     Rexene Edison, NP 04/14/2022

## 2022-04-16 NOTE — Telephone Encounter (Signed)
Called patient back and she states at the [place] walk in clinic they gave her ipratropium nasal spray. She states it has been very helpful. And she is wondering if she can continue this nasal stray.   Please advise

## 2022-04-16 NOTE — Telephone Encounter (Signed)
Please advise 

## 2022-04-17 NOTE — Telephone Encounter (Signed)
Yes that is fine

## 2022-04-18 MED ORDER — IPRATROPIUM BROMIDE 0.06 % NA SOLN: 2.0000 | Freq: Four times a day (QID) | NASAL | 12 refills | Status: DC

## 2022-04-18 NOTE — Telephone Encounter (Signed)
Refilled nasal spray. Nothing further needed

## 2022-04-28 ENCOUNTER — Ambulatory Visit: Payer: PPO | Admitting: Pulmonary Disease

## 2022-04-29 ENCOUNTER — Ambulatory Visit: Payer: PPO | Admitting: Pulmonary Disease

## 2022-05-05 DIAGNOSIS — E559 Vitamin D deficiency, unspecified: Secondary | ICD-10-CM | POA: Diagnosis not present

## 2022-05-05 DIAGNOSIS — I712 Thoracic aortic aneurysm, without rupture, unspecified: Secondary | ICD-10-CM | POA: Diagnosis not present

## 2022-05-05 DIAGNOSIS — M810 Age-related osteoporosis without current pathological fracture: Secondary | ICD-10-CM | POA: Diagnosis not present

## 2022-05-05 DIAGNOSIS — E538 Deficiency of other specified B group vitamins: Secondary | ICD-10-CM | POA: Diagnosis not present

## 2022-05-05 DIAGNOSIS — F330 Major depressive disorder, recurrent, mild: Secondary | ICD-10-CM | POA: Diagnosis not present

## 2022-05-05 DIAGNOSIS — J309 Allergic rhinitis, unspecified: Secondary | ICD-10-CM | POA: Diagnosis not present

## 2022-05-05 DIAGNOSIS — D692 Other nonthrombocytopenic purpura: Secondary | ICD-10-CM | POA: Diagnosis not present

## 2022-05-05 DIAGNOSIS — G43909 Migraine, unspecified, not intractable, without status migrainosus: Secondary | ICD-10-CM | POA: Diagnosis not present

## 2022-05-05 DIAGNOSIS — Z0000 Encounter for general adult medical examination without abnormal findings: Secondary | ICD-10-CM | POA: Diagnosis not present

## 2022-05-05 DIAGNOSIS — I10 Essential (primary) hypertension: Secondary | ICD-10-CM | POA: Diagnosis not present

## 2022-05-05 DIAGNOSIS — Z79899 Other long term (current) drug therapy: Secondary | ICD-10-CM | POA: Diagnosis not present

## 2022-05-05 DIAGNOSIS — J449 Chronic obstructive pulmonary disease, unspecified: Secondary | ICD-10-CM | POA: Diagnosis not present

## 2022-05-07 DIAGNOSIS — N762 Acute vulvitis: Secondary | ICD-10-CM | POA: Diagnosis not present

## 2022-05-20 ENCOUNTER — Ambulatory Visit: Payer: PPO | Admitting: Adult Health

## 2022-05-20 ENCOUNTER — Encounter: Payer: Self-pay | Admitting: Adult Health

## 2022-05-20 DIAGNOSIS — J310 Chronic rhinitis: Secondary | ICD-10-CM | POA: Diagnosis not present

## 2022-05-20 DIAGNOSIS — J449 Chronic obstructive pulmonary disease, unspecified: Secondary | ICD-10-CM

## 2022-05-20 DIAGNOSIS — U099 Post covid-19 condition, unspecified: Secondary | ICD-10-CM | POA: Diagnosis not present

## 2022-05-20 MED ORDER — TRELEGY ELLIPTA 200-62.5-25 MCG/ACT IN AEPB: 1.0000 | INHALATION_SPRAY | Freq: Every day | RESPIRATORY_TRACT | 0 refills | Status: DC

## 2022-05-20 NOTE — Assessment & Plan Note (Addendum)
Some improvement with Allegra.  Continue on saline nasal rinses.   Follow-up with ENT as able  Plan  Patient Instructions  Continue on TRELEGY 1 puff daily , rinse after use.  Delsym 2 tsp Twice daily  for cough , As needed   Tessalon Three times a day  for cough , as needed.  Continue on Nexium daily  Allegra '180mg'$  daily  Saline nasal spray Twice daily   Saline nasal gel At bedtime   Try sips of water to soothe throat and avoid throat clearing and coughing.  Xopenex inhaler or nebs As needed   Follow up Dr. Loanne Drilling or Ovadia Lopp in 3-4 months and As needed   Please contact office for sooner follow up if symptoms do not improve or worsen or seek emergency care

## 2022-05-20 NOTE — Progress Notes (Signed)
$'@Patient'P$  ID: Catherine [name], female    DOB: 03-Aug-1946, 76 y.o.   MRN: [id number]  Chief Complaint  Patient presents with   Follow-up    Referring provider: Jonathon Jordan, MD  HPI: 76 year old female former smoker followed for COPD Medical history significant for aortic aneurysm, neuropathy, osteoarthritis and depression with anxiety COVID-19 infection March 3474, complicated by left lower lobe pneumonia and slow to resolve COPD exacerbation  TEST/EVENTS :  09/05/2020 CT chest without contrast: There is atherosclerosis.  There is no LAD present.  Mild centrilobular emphysema.  There is a solid 3 mm left lower lobe pulmonary nodule, considered benign and stable.  There is no acute pulmonary process noted.  Nonaneurysmal thoracic aorta.  Maximal ascending thoracic aorta measuring 3.9 cm.   12/2021 spirometry that showed mild airflow obstruction with an FEV1 84%, ratio 67 and FVC 95%.  Exhaled nitric oxide testing was 7 ppb.    Dec 24, 2021 absolute eosinophil count 100   CT chest January 06, 2022 showed resolution of pneumonia, mild linear scarring in the right middle lobe, no suspicious nodules.  Negative for consolidation.  And mild emphysema.  05/20/2022 Follow up : COPD  Patient presents for a 1 month follow-up.  Patient was seen last visit with a COPD exacerbation.  She was given a prednisone taper.  Since last visit patient is feeling much better.  Nasal congestion and cough are much improved.  Still has some ear fullness on the right side.  Feels that her hearing is decreased.  She is trying to get in with her ENT.  She denies any fever or discolored mucus. She remains on Trelegy inhaler daily.  Feels that Allegra has helped quite a bit. Activity level is at baseline but gets fatigued easily. She says her COVID fatigue is slightly better she is not having to take as many naps.    Allergies  Allergen Reactions   Ciprofloxacin Hcl Other (See Comments)    Aortic aneurysm per MD    Metoprolol Other (See Comments) and Cough    and wheezing   Penicillins Shortness Of Breath, Rash and Other (See Comments)    Has patient had a PCN reaction causing immediate rash, facial/tongue/throat swelling, SOB or lightheadedness with hypotension: Yes Has patient had a PCN reaction causing severe rash involving mucus membranes or skin necrosis: No Has patient had a PCN reaction that required hospitalization: Yes - MD office Has patient had a PCN reaction occurring within the last 10 years: No If all of the above answers are "NO", then may proceed with Cephalosporin use.    Oxycodone Other (See Comments)    Nightmares.  Tolerates tramadol   Advair Diskus [Fluticasone-Salmeterol] Anxiety   Fosamax [Alendronate] Other (See Comments)    Other reaction(s): GERD    Immunization History  Administered Date(s) Administered   DTaP 05/09/2006   Fluad Quad(high Dose 65+) 05/05/2019   H1N1 05/26/2008   Influenza Split 05/20/2008, 05/29/2009, 04/24/2010, 05/13/2011, 04/20/2012, 05/13/2013   Influenza, High Dose Seasonal PF 05/14/2021   Influenza,inj,Quad PF,6+ Mos 04/15/2016, 03/27/2017, 05/06/2018, 05/10/2019, 05/08/2020   Influenza,inj,quad, With Preservative 04/27/2014, 04/27/2015   Influenza-Unspecified 04/04/2014, 05/06/2018   Moderna Covid-19 Vaccine Bivalent Booster 67yr & up 05/20/2021   Moderna SARS-COV2 Booster Vaccination 06/06/2020, 12/04/2020   Moderna Sars-Covid-2 Vaccination 09/02/2019, 09/30/2019, 06/05/2020, 12/03/2020   Pneumococcal Conjugate-13 04/04/2014   Pneumococcal Polysaccharide-23 06/06/2009   Pneumococcal-Unspecified 06/06/2009, 04/04/2014   Td 09/10/2018   Tdap 09/10/2018   Zoster, Live 07/11/2009, 03/12/2021, 08/13/2021  Past Medical History:  Diagnosis Date   Abdominal pain 06/06/2019   Anxiety    Aortic aneurysm ([place]) 12/08/2018   Arthritis    HANDS   BCC (basal cell carcinoma) 04/07/1995   right jawline (CX3, 5FU)   BCC (basal cell carcinoma)  05/19/2013   right scalp/post (treatment biopsy)   Chronic constipation    Complication of anesthesia    upon waking up from anesthesia patient experienced chest pain - EKG done was negative, patient states "it may have been indigestion"   COPD (chronic obstructive pulmonary disease) (HCC)    DDD (degenerative disc disease), lumbar    L2-3/3-4/4-5   Dizziness 01/17/2019   DOE (dyspnea on exertion) 01/17/2019   Dysplastic nevus 12/04/2009   mod-severe left forearm, exc   Dysuria    Frequency of urination    Ganglion cyst of right groin 11/23/2018   GERD (gastroesophageal reflux disease)    Hearing aid worn    Infected hernioplasty mesh ([place]) 01/26/2019   Keratosis, actinic 02/18/1991   left abdomen   Migraine    Mild emphysema ([place])    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   Osteopenia 2019   TO OSTEOPOROSIS    Pelvic pain in female    Seroma, post-traumatic ([place]) 04/27/2019   Smokers' cough ([place])    Thoracic aortic aneurysm (TAA) (HCC)    FOLLOWED BY DR Cyndia Bent  LAST CT CHEST  WAS 6-3 SEE EPIC    Urgency of urination    h/o INTERSTIAL CYSTITIS...DR. HUMPHRIES/GRAPEY   Wears glasses     Tobacco History: Social History   Tobacco Use  Smoking Status Former   Packs/day: 0.75   Years: 48.00   Total pack years: 36.00   Types: Cigarettes   Quit date: 09/04/2017   Years since quitting: 4.7  Smokeless Tobacco Never   Counseling given: Not Answered   Outpatient Medications Prior to Visit  Medication Sig Dispense Refill   acetaminophen (TYLENOL) 500 MG tablet Take 1,000 mg by mouth at bedtime.     albuterol (PROVENTIL) (2.5 MG/3ML) 0.083% nebulizer solution Take 3 mLs (2.5 mg total) by nebulization every 6 (six) hours as needed for wheezing or shortness of breath. 75 mL 5   Azelastine HCl (ASTEPRO NA) Place into the nose at bedtime.     CALCIUM CARBONATE ANTACID PO Take 1,000 mg by mouth daily.      carboxymethylcellulose (REFRESH PLUS) 0.5 % SOLN Place 2 drops into both  eyes 3 (three) times daily.      cetirizine (ZYRTEC) 10 MG tablet Take 10 mg by mouth daily.     Cholecalciferol (VITAMIN D3 PO) Take 6,000 Units by mouth daily.     Cyanocobalamin (VITAMIN B-12 PO) Take 1,200 mcg by mouth daily.     denosumab (PROLIA) 60 MG/ML SOSY injection Inject 60 mg into the skin every 6 (six) months.     docusate sodium (COLACE) 100 MG capsule Take 200 mg by mouth daily.      esomeprazole (NEXIUM) 20 MG capsule Take 20 mg by mouth daily.     Fluticasone-Umeclidin-Vilant (TRELEGY ELLIPTA) 200-62.5-25 MCG/ACT AEPB Inhale 1 puff into the lungs daily at 2 PM. 60 each 5   ipratropium (ATROVENT) 0.06 % nasal spray Place 2 sprays into both nostrils 4 (four) times daily. 15 mL 12   levalbuterol (XOPENEX HFA) 45 MCG/ACT inhaler Inhale 2 puffs into the lungs every 4 (four) hours as needed for wheezing. 1 each 2   loratadine (CLARITIN) 5 MG chewable tablet Chew 5 mg  by mouth daily.     LORazepam (ATIVAN) 1 MG tablet Take 0.5-1 mg by mouth See admin instructions. Take 0.5 mg in the morning and 1.0 mg at bedtime     polyethylene glycol (MIRALAX / GLYCOLAX) packet Take 17 g by mouth daily.      senna (SENOKOT) 8.6 MG TABS tablet Take 2 tablets by mouth at bedtime.     SUMAtriptan (IMITREX) 50 MG tablet Take 50 mg by mouth every 2 (two) hours as needed for migraine. May repeat in 2 hours if headache persists or recurs.     traMADol (ULTRAM) 50 MG tablet Take 50 mg by mouth every 6 (six) hours as needed for moderate pain.     mometasone (NASONEX) 50 MCG/ACT nasal spray Place 2 sprays into the nose daily.     predniSONE (DELTASONE) 10 MG tablet 4 tabs for 2 days, then 3 tabs for 2 days, 2 tabs for 2 days, then 1 tab for 2 days, then stop 20 tablet 0   No facility-administered medications prior to visit.     Review of Systems:   Constitutional:   No  weight loss, night sweats,  Fevers, chills,  +fatigue, or  lassitude.  HEENT:   No headaches,  Difficulty swallowing,  Tooth/dental  problems, or  Sore throat,                No sneezing, itching, ear ache, nasal congestion, post nasal drip,   CV:  No chest pain,  Orthopnea, PND, swelling in lower extremities, anasarca, dizziness, palpitations, syncope.   GI  No heartburn, indigestion, abdominal pain, nausea, vomiting, diarrhea, change in bowel habits, loss of appetite, bloody stools.   Resp:  no chest wall deformity  Skin: no rash or lesions.  GU: no dysuria, change in color of urine, no urgency or frequency.  No flank pain, no hematuria   MS:  No joint pain or swelling.  No decreased range of motion.  No back pain.    Physical Exam  BP 116/82 (BP Location: Left Arm, Cuff Size: Normal)   Pulse 91   Temp 98.3 F (36.8 C) (Oral)   Ht '5\' 10"'$  (1.778 m)   Wt 159 lb 3.2 oz (72.2 kg)   SpO2 91%   BMI 22.84 kg/m   GEN: A/Ox3; pleasant , NAD, well nourished    HEENT:  [place]/AT,  EACs-clear, TMs-wnl, NOSE-clear, THROAT-clear, no lesions, no postnasal drip or exudate noted.   NECK:  Supple w/ fair ROM; no JVD; normal carotid impulses w/o bruits; no thyromegaly or nodules palpated; no lymphadenopathy.    RESP  Clear  P & A; w/o, wheezes/ rales/ or rhonchi. no accessory muscle use, no dullness to percussion  CARD:  RRR, no m/r/g, no peripheral edema, pulses intact, no cyanosis or clubbing.  GI:   Soft & nt; nml bowel sounds; no organomegaly or masses detected.   Musco: Warm bil, no deformities or joint swelling noted.   Neuro: alert, no focal deficits noted.    Skin: Warm, no lesions or rashes    Lab Results:  CBC    Component Value Date/Time   WBC 6.3 12/24/2021 1309   RBC 4.48 12/24/2021 1309   HGB 13.3 12/24/2021 1309   HCT 39.8 12/24/2021 1309   PLT 215.0 12/24/2021 1309   MCV 88.7 12/24/2021 1309   MCH 29.9 01/14/2021 1328   MCHC 33.4 12/24/2021 1309   RDW 14.3 12/24/2021 1309   LYMPHSABS 1.4 12/24/2021 1309   MONOABS 0.4  12/24/2021 1309   EOSABS 0.1 12/24/2021 1309   BASOSABS 0.0 12/24/2021  1309    BMET    Component Value Date/Time   NA 137 12/24/2021 1309   K 4.0 12/24/2021 1309   CL 103 12/24/2021 1309   CO2 27 12/24/2021 1309   GLUCOSE 103 (H) 12/24/2021 1309   BUN 20 12/24/2021 1309   CREATININE 0.87 12/24/2021 1309   CREATININE 0.93 10/17/2019 1054   CALCIUM 9.3 12/24/2021 1309   GFRNONAA 57 (L) 01/14/2021 1328   GFRNONAA 61 10/17/2019 1054   GFRAA 71 10/17/2019 1054    BNP    Component Value Date/Time   BNP 16.9 01/18/2019 1124    ProBNP    Component Value Date/Time   PROBNP 12.0 12/24/2021 1309    Imaging: No results found.        No data to display          Lab Results  Component Value Date   NITRICOXIDE 9 11/20/2021        Assessment & Plan:   Chronic obstructive pulmonary disease ([place]) Recent exacerbation now improved.  Continue on current regimen  Plan  Patient Instructions  Continue on TRELEGY 1 puff daily , rinse after use.  Delsym 2 tsp Twice daily  for cough , As needed   Tessalon Three times a day  for cough , as needed.  Continue on Nexium daily  Allegra '180mg'$  daily  Saline nasal spray Twice daily   Saline nasal gel At bedtime   Try sips of water to soothe throat and avoid throat clearing and coughing.  Xopenex inhaler or nebs As needed   Follow up Dr. Loanne Drilling or Shamina Etheridge in 3-4 months and As needed   Please contact office for sooner follow up if symptoms do not improve or worsen or seek emergency care      COVID-19 long hauler Ongoing chronic symptoms with fatigue, decreased endurance and stamina.  Does report some perceived decrease in symptoms since last visit.  Continue with activity as tolerated.  Chronic rhinitis Some improvement with Allegra.  Continue on saline nasal rinses.   Follow-up with ENT as able  Plan  Patient Instructions  Continue on TRELEGY 1 puff daily , rinse after use.  Delsym 2 tsp Twice daily  for cough , As needed   Tessalon Three times a day  for cough , as needed.  Continue  on Nexium daily  Allegra '180mg'$  daily  Saline nasal spray Twice daily   Saline nasal gel At bedtime   Try sips of water to soothe throat and avoid throat clearing and coughing.  Xopenex inhaler or nebs As needed   Follow up Dr. Loanne Drilling or Breyson Kelm in 3-4 months and As needed   Please contact office for sooner follow up if symptoms do not improve or worsen or seek emergency care        Rexene Edison, NP 05/20/2022

## 2022-05-20 NOTE — Assessment & Plan Note (Signed)
Recent exacerbation now improved.  Continue on current regimen  Plan  Patient Instructions  Continue on TRELEGY 1 puff daily , rinse after use.  Delsym 2 tsp Twice daily  for cough , As needed   Tessalon Three times a day  for cough , as needed.  Continue on Nexium daily  Allegra '180mg'$  daily  Saline nasal spray Twice daily   Saline nasal gel At bedtime   Try sips of water to soothe throat and avoid throat clearing and coughing.  Xopenex inhaler or nebs As needed   Follow up Dr. Loanne Drilling or Soumya Colson in 3-4 months and As needed   Please contact office for sooner follow up if symptoms do not improve or worsen or seek emergency care

## 2022-05-20 NOTE — Assessment & Plan Note (Signed)
Ongoing chronic symptoms with fatigue, decreased endurance and stamina.  Does report some perceived decrease in symptoms since last visit.  Continue with activity as tolerated.

## 2022-05-20 NOTE — Patient Instructions (Addendum)
Continue on TRELEGY 1 puff daily , rinse after use.  Delsym 2 tsp Twice daily  for cough , As needed   Tessalon Three times a day  for cough , as needed.  Continue on Nexium daily  Allegra '180mg'$  daily  Saline nasal spray Twice daily   Saline nasal gel At bedtime   Try sips of water to soothe throat and avoid throat clearing and coughing.  Xopenex inhaler or nebs As needed   Follow up Dr. Loanne Drilling or Margueritte Guthridge in 3-4 months and As needed   Please contact office for sooner follow up if symptoms do not improve or worsen or seek emergency care

## 2022-06-03 DIAGNOSIS — J324 Chronic pansinusitis: Secondary | ICD-10-CM | POA: Diagnosis not present

## 2022-06-03 DIAGNOSIS — H6991 Unspecified Eustachian tube disorder, right ear: Secondary | ICD-10-CM | POA: Diagnosis not present

## 2022-06-03 DIAGNOSIS — R600 Localized edema: Secondary | ICD-10-CM | POA: Diagnosis not present

## 2022-06-04 DIAGNOSIS — H40011 Open angle with borderline findings, low risk, right eye: Secondary | ICD-10-CM | POA: Diagnosis not present

## 2022-06-04 DIAGNOSIS — H04123 Dry eye syndrome of bilateral lacrimal glands: Secondary | ICD-10-CM | POA: Diagnosis not present

## 2022-06-04 DIAGNOSIS — H43811 Vitreous degeneration, right eye: Secondary | ICD-10-CM | POA: Diagnosis not present

## 2022-06-04 DIAGNOSIS — Z961 Presence of intraocular lens: Secondary | ICD-10-CM | POA: Diagnosis not present

## 2022-06-05 DIAGNOSIS — M810 Age-related osteoporosis without current pathological fracture: Secondary | ICD-10-CM | POA: Diagnosis not present

## 2022-07-21 DIAGNOSIS — H40011 Open angle with borderline findings, low risk, right eye: Secondary | ICD-10-CM | POA: Diagnosis not present

## 2022-07-21 DIAGNOSIS — H43811 Vitreous degeneration, right eye: Secondary | ICD-10-CM | POA: Diagnosis not present

## 2022-07-21 DIAGNOSIS — H04123 Dry eye syndrome of bilateral lacrimal glands: Secondary | ICD-10-CM | POA: Diagnosis not present

## 2022-07-30 ENCOUNTER — Other Ambulatory Visit: Payer: Self-pay | Admitting: Pulmonary Disease

## 2022-08-13 ENCOUNTER — Other Ambulatory Visit: Payer: Self-pay | Admitting: Surgery

## 2022-08-13 DIAGNOSIS — I7121 Aneurysm of the ascending aorta, without rupture: Secondary | ICD-10-CM

## 2022-08-21 DIAGNOSIS — H6991 Unspecified Eustachian tube disorder, right ear: Secondary | ICD-10-CM | POA: Diagnosis not present

## 2022-09-01 DIAGNOSIS — M9902 Segmental and somatic dysfunction of thoracic region: Secondary | ICD-10-CM | POA: Diagnosis not present

## 2022-09-01 DIAGNOSIS — M9901 Segmental and somatic dysfunction of cervical region: Secondary | ICD-10-CM | POA: Diagnosis not present

## 2022-09-01 DIAGNOSIS — S29012A Strain of muscle and tendon of back wall of thorax, initial encounter: Secondary | ICD-10-CM | POA: Diagnosis not present

## 2022-09-01 DIAGNOSIS — R519 Headache, unspecified: Secondary | ICD-10-CM | POA: Diagnosis not present

## 2022-09-03 DIAGNOSIS — L309 Dermatitis, unspecified: Secondary | ICD-10-CM | POA: Diagnosis not present

## 2022-09-03 DIAGNOSIS — L578 Other skin changes due to chronic exposure to nonionizing radiation: Secondary | ICD-10-CM | POA: Diagnosis not present

## 2022-09-03 DIAGNOSIS — L568 Other specified acute skin changes due to ultraviolet radiation: Secondary | ICD-10-CM | POA: Diagnosis not present

## 2022-09-03 DIAGNOSIS — D229 Melanocytic nevi, unspecified: Secondary | ICD-10-CM | POA: Diagnosis not present

## 2022-09-03 DIAGNOSIS — C4441 Basal cell carcinoma of skin of scalp and neck: Secondary | ICD-10-CM | POA: Diagnosis not present

## 2022-09-03 DIAGNOSIS — L814 Other melanin hyperpigmentation: Secondary | ICD-10-CM | POA: Diagnosis not present

## 2022-09-03 DIAGNOSIS — D1801 Hemangioma of skin and subcutaneous tissue: Secondary | ICD-10-CM | POA: Diagnosis not present

## 2022-09-03 DIAGNOSIS — D485 Neoplasm of uncertain behavior of skin: Secondary | ICD-10-CM | POA: Diagnosis not present

## 2022-09-03 DIAGNOSIS — L821 Other seborrheic keratosis: Secondary | ICD-10-CM | POA: Diagnosis not present

## 2022-09-03 DIAGNOSIS — L720 Epidermal cyst: Secondary | ICD-10-CM | POA: Diagnosis not present

## 2022-09-04 DIAGNOSIS — S29012A Strain of muscle and tendon of back wall of thorax, initial encounter: Secondary | ICD-10-CM | POA: Diagnosis not present

## 2022-09-04 DIAGNOSIS — M9901 Segmental and somatic dysfunction of cervical region: Secondary | ICD-10-CM | POA: Diagnosis not present

## 2022-09-04 DIAGNOSIS — R519 Headache, unspecified: Secondary | ICD-10-CM | POA: Diagnosis not present

## 2022-09-04 DIAGNOSIS — M9902 Segmental and somatic dysfunction of thoracic region: Secondary | ICD-10-CM | POA: Diagnosis not present

## 2022-09-05 DIAGNOSIS — R062 Wheezing: Secondary | ICD-10-CM | POA: Diagnosis not present

## 2022-09-05 DIAGNOSIS — J441 Chronic obstructive pulmonary disease with (acute) exacerbation: Secondary | ICD-10-CM | POA: Diagnosis not present

## 2022-09-05 DIAGNOSIS — Z03818 Encounter for observation for suspected exposure to other biological agents ruled out: Secondary | ICD-10-CM | POA: Diagnosis not present

## 2022-09-05 DIAGNOSIS — R051 Acute cough: Secondary | ICD-10-CM | POA: Diagnosis not present

## 2022-09-05 DIAGNOSIS — R519 Headache, unspecified: Secondary | ICD-10-CM | POA: Diagnosis not present

## 2022-09-08 DIAGNOSIS — M9901 Segmental and somatic dysfunction of cervical region: Secondary | ICD-10-CM | POA: Diagnosis not present

## 2022-09-08 DIAGNOSIS — R519 Headache, unspecified: Secondary | ICD-10-CM | POA: Diagnosis not present

## 2022-09-08 DIAGNOSIS — S29012A Strain of muscle and tendon of back wall of thorax, initial encounter: Secondary | ICD-10-CM | POA: Diagnosis not present

## 2022-09-08 DIAGNOSIS — M9902 Segmental and somatic dysfunction of thoracic region: Secondary | ICD-10-CM | POA: Diagnosis not present

## 2022-09-11 ENCOUNTER — Ambulatory Visit
Admission: RE | Admit: 2022-09-11 | Discharge: 2022-09-11 | Disposition: A | Discharge status: Home / Self Care | Payer: PPO | Source: Ambulatory Visit | Attending: Surgery | Admitting: Surgery

## 2022-09-11 DIAGNOSIS — R519 Headache, unspecified: Secondary | ICD-10-CM | POA: Diagnosis not present

## 2022-09-11 DIAGNOSIS — M9901 Segmental and somatic dysfunction of cervical region: Secondary | ICD-10-CM | POA: Diagnosis not present

## 2022-09-11 DIAGNOSIS — S29012A Strain of muscle and tendon of back wall of thorax, initial encounter: Secondary | ICD-10-CM | POA: Diagnosis not present

## 2022-09-11 DIAGNOSIS — I700 Atherosclerosis of aorta: Secondary | ICD-10-CM | POA: Diagnosis not present

## 2022-09-11 DIAGNOSIS — I7121 Aneurysm of the ascending aorta, without rupture: Secondary | ICD-10-CM

## 2022-09-11 DIAGNOSIS — M9902 Segmental and somatic dysfunction of thoracic region: Secondary | ICD-10-CM | POA: Diagnosis not present

## 2022-09-15 DIAGNOSIS — M9902 Segmental and somatic dysfunction of thoracic region: Secondary | ICD-10-CM | POA: Diagnosis not present

## 2022-09-15 DIAGNOSIS — R519 Headache, unspecified: Secondary | ICD-10-CM | POA: Diagnosis not present

## 2022-09-15 DIAGNOSIS — S29012A Strain of muscle and tendon of back wall of thorax, initial encounter: Secondary | ICD-10-CM | POA: Diagnosis not present

## 2022-09-15 DIAGNOSIS — M9901 Segmental and somatic dysfunction of cervical region: Secondary | ICD-10-CM | POA: Diagnosis not present

## 2022-09-18 DIAGNOSIS — R519 Headache, unspecified: Secondary | ICD-10-CM | POA: Diagnosis not present

## 2022-09-18 DIAGNOSIS — M9902 Segmental and somatic dysfunction of thoracic region: Secondary | ICD-10-CM | POA: Diagnosis not present

## 2022-09-18 DIAGNOSIS — S29012A Strain of muscle and tendon of back wall of thorax, initial encounter: Secondary | ICD-10-CM | POA: Diagnosis not present

## 2022-09-18 DIAGNOSIS — M9901 Segmental and somatic dysfunction of cervical region: Secondary | ICD-10-CM | POA: Diagnosis not present

## 2022-09-19 ENCOUNTER — Encounter: Payer: Self-pay | Admitting: Adult Health

## 2022-09-19 ENCOUNTER — Ambulatory Visit: Payer: PPO | Admitting: Adult Health

## 2022-09-19 VITALS — BP 122/80 | HR 85 | Temp 97.6°F | Ht 70.0 in | Wt 161.2 lb

## 2022-09-19 DIAGNOSIS — J449 Chronic obstructive pulmonary disease, unspecified: Secondary | ICD-10-CM | POA: Diagnosis not present

## 2022-09-19 DIAGNOSIS — Z23 Encounter for immunization: Secondary | ICD-10-CM

## 2022-09-19 DIAGNOSIS — J310 Chronic rhinitis: Secondary | ICD-10-CM | POA: Diagnosis not present

## 2022-09-19 NOTE — Progress Notes (Signed)
[name]  ID: [name], female    DOB: 26-Oct-1945, 77 y.o.   MRN: [id number]  Chief Complaint  Patient presents with   Follow-up    Referring provider: Jonathon Jordan, MD  HPI: 77 year old female former smoker followed for COPD Medical history significant for aortic aneurysm, neuropathy, osteoarthritis, depression and anxiety COVID-19 infection March [id number] complicated by left lower lobe pneumonia and COPD exacerbation.  TEST/EVENTS :  09/05/2020 CT chest without contrast: There is atherosclerosis.  There is no LAD present.  Mild centrilobular emphysema.  There is a solid 3 mm left lower lobe pulmonary nodule, considered benign and stable.  There is no acute pulmonary process noted.  Nonaneurysmal thoracic aorta.  Maximal ascending thoracic aorta measuring 3.9 cm.   12/2021 spirometry that showed mild airflow obstruction with an FEV1 84%, ratio 67 and FVC 95%.   Exhaled nitric oxide testing was 7 ppb.    Dec 24, 2021 absolute eosinophil count 100   CT chest January 06, 2022 showed resolution of pneumonia, mild linear scarring in the right middle lobe, no suspicious nodules.  Negative for consolidation.  And mild emphysema.  09/19/2022 Follow up ; COPD  Patient returns for 68-monthfollow-up.  Patient has underlying COPD.  Had COPD flare 2 weeks ago, seen by PCP walk in clinic . Given Zpack and Prednisone taper. She is feeling better.  She remains on Trelegy inhaler daily.   Following with ENT for chronic sinusitis and eustachian tube dysfunction. Recently had tympastomey tubes placed in right ear. Some decrease in sinus press but still has drainage and cough. Remains on Allegra and Saline nasal rinses.  Used budesonide nasal rinses for 3 months without much perceived benefit.  Covid , RSV and Flu vaccine utd from Oct 2023.   Remains Independent , lives with husband. Drives , shops, and light housework , cooks. Does not exercise. Sedentary.     Allergies  Allergen Reactions    Ciprofloxacin Hcl Other (See Comments)    Aortic aneurysm per MD   Metoprolol Other (See Comments) and Cough    and wheezing   Penicillins Shortness Of Breath, Rash and Other (See Comments)    Has patient had a PCN reaction causing immediate rash, facial/tongue/throat swelling, SOB or lightheadedness with hypotension: Yes Has patient had a PCN reaction causing severe rash involving mucus membranes or skin necrosis: No Has patient had a PCN reaction that required hospitalization: Yes - MD office Has patient had a PCN reaction occurring within the last 10 years: No If all of the above answers are "NO", then may proceed with Cephalosporin use.    Oxycodone Other (See Comments)    Nightmares.  Tolerates tramadol   Advair Diskus [Fluticasone-Salmeterol] Anxiety   Fosamax [Alendronate] Other (See Comments)    Other reaction(s): GERD    Immunization History  Administered Date(s) Administered   DTaP 05/09/2006   Fluad Quad(high Dose 65+) 05/05/2019   H1N1 05/26/2008   Influenza Split 05/20/2008, 05/29/2009, 04/24/2010, 05/13/2011, 04/20/2012, 05/13/2013   Influenza, High Dose Seasonal PF 05/14/2021   Influenza,inj,Quad PF,6+ Mos 04/15/2016, 03/27/2017, 05/06/2018, 05/10/2019, 05/08/2020   Influenza,inj,quad, With Preservative 04/27/2014, 04/27/2015   Influenza-Unspecified 04/04/2014, 05/06/2018   Moderna Covid-19 Vaccine Bivalent Booster 168yr& up 05/20/2021   Moderna SARS-COV2 Booster Vaccination 06/06/2020, 12/04/2020   Moderna Sars-Covid-2 Vaccination 09/02/2019, 09/30/2019, 06/05/2020, 12/03/2020   PNEUMOCOCCAL CONJUGATE-20 09/19/2022   Pneumococcal Conjugate-13 04/04/2014   Pneumococcal Polysaccharide-23 06/06/2009   Pneumococcal-Unspecified 06/06/2009, 04/04/2014   Td 09/10/2018   Tdap 09/10/2018  Zoster, Live 07/11/2009, 03/12/2021, 08/13/2021    Past Medical History:  Diagnosis Date   Abdominal pain 06/06/2019   Anxiety    Aortic aneurysm ([place]) 12/08/2018   Arthritis     HANDS   BCC (basal cell carcinoma) 04/07/1995   right jawline (CX3, 5FU)   BCC (basal cell carcinoma) 05/19/2013   right scalp/post (treatment biopsy)   Chronic constipation    Complication of anesthesia    upon waking up from anesthesia patient experienced chest pain - EKG done was negative, patient states "it may have been indigestion"   COPD (chronic obstructive pulmonary disease) (HCC)    DDD (degenerative disc disease), lumbar    L2-3/3-4/4-5   Dizziness 01/17/2019   DOE (dyspnea on exertion) 01/17/2019   Dysplastic nevus 12/04/2009   mod-severe left forearm, exc   Dysuria    Frequency of urination    Ganglion cyst of right groin 11/23/2018   GERD (gastroesophageal reflux disease)    Hearing aid worn    Infected hernioplasty mesh ([place]) 01/26/2019   Keratosis, actinic 02/18/1991   left abdomen   Migraine    Mild emphysema ([place])    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 HUMPHRIES/GRAPEY   Wears glasses     Tobacco History: Social History   Tobacco Use  Smoking Status Former   Packs/day: 0.75   Years: 48.00   Total pack years: 36.00   Types: Cigarettes   Quit date: 09/04/2017   Years since quitting: 5.0  Smokeless Tobacco Never   Counseling given: Not Answered   Outpatient Medications Prior to Visit  Medication Sig Dispense Refill   acetaminophen (TYLENOL) 500 MG tablet Take 1,000 mg by mouth at bedtime.     albuterol (PROVENTIL) (2.5 MG/3ML) 0.083% nebulizer solution Take 3 mLs (2.5 mg total) by nebulization every 6 (six) hours as needed for wheezing or shortness of breath. 75 mL 5   Azelastine HCl (ASTEPRO NA) Place 2 puffs into the nose daily.     CALCIUM CARBONATE ANTACID PO  Take 1,000 mg by mouth daily.      carboxymethylcellulose (REFRESH PLUS) 0.5 % SOLN Place 2 drops into both eyes 3 (three) times daily.      cetirizine (ZYRTEC) 10 MG tablet Take 10 mg by mouth daily.     Cholecalciferol (VITAMIN D3 PO) Take 6,000 Units by mouth daily.     Cyanocobalamin (VITAMIN B-12 PO) Take 1,200 mcg by mouth daily.     denosumab (PROLIA) 60 MG/ML SOSY injection Inject 60 mg into the skin every 6 (six) months.     docusate sodium (COLACE) 100 MG capsule Take 200 mg by mouth daily.      esomeprazole (NEXIUM) 20 MG capsule Take 20 mg by mouth daily.     fexofenadine (ALLEGRA ODT) 30 MG disintegrating tablet Take 60 mg by mouth daily.     Fluticasone-Umeclidin-Vilant (TRELEGY ELLIPTA) 200-62.5-25 MCG/ACT AEPB Inhale 1 puff into the lungs daily. 1 each 0   levalbuterol (XOPENEX HFA) 45 MCG/ACT inhaler Inhale 2 puffs into the lungs every 4 (four) hours as needed for wheezing. 1 each 2   LORazepam (ATIVAN) 1 MG tablet Take 0.5-1  mg by mouth See admin instructions. Take 0.5 mg in the morning and 1.0 mg at bedtime     polyethylene glycol (MIRALAX / GLYCOLAX) packet Take 17 g by mouth daily.      senna (SENOKOT) 8.6 MG TABS tablet Take 2 tablets by mouth at bedtime.     Sodium Chloride-Sodium Bicarb (AYR SALINE NASAL RINSE NA) Place into the nose.     SUMAtriptan (IMITREX) 50 MG tablet Take 50 mg by mouth every 2 (two) hours as needed for migraine. May repeat in 2 hours if headache persists or recurs.     traMADol (ULTRAM) 50 MG tablet Take 50 mg by mouth every 6 (six) hours as needed for moderate pain.     TRELEGY ELLIPTA 200-62.5-25 MCG/ACT AEPB INHALE 1 PUFF INTO THE LUNGS DAILY AT 2 PM. 60 each 5   Azelastine HCl (ASTEPRO NA) Place into the nose at bedtime.     ipratropium (ATROVENT) 0.06 % nasal spray Place 2 sprays into both nostrils 4 (four) times daily. 15 mL 12   loratadine (CLARITIN) 5 MG chewable tablet Chew 5 mg by mouth daily.     No facility-administered medications  prior to visit.     Review of Systems:   Constitutional:   No  weight loss, night sweats,  Fevers, chills, fatigue, or  lassitude.  HEENT:   No headaches,  Difficulty swallowing,  Tooth/dental problems, or  Sore throat,                No sneezing, itching, ear ache, +nasal congestion, post nasal drip,   CV:  No chest pain,  Orthopnea, PND, swelling in lower extremities, anasarca, dizziness, palpitations, syncope.   GI  No heartburn, indigestion, abdominal pain, nausea, vomiting, diarrhea, change in bowel habits, loss of appetite, bloody stools.   Resp:   No chest wall deformity  Skin: no rash or lesions.  GU: no dysuria, change in color of urine, no urgency or frequency.  No flank pain, no hematuria   MS:  No joint pain or swelling.  No decreased range of motion.  No back pain.    Physical Exam  BP 122/80 (BP Location: Left Arm, Patient Position: Sitting, Cuff Size: Normal)   Pulse 85   Temp 97.6 F (36.4 C) (Oral)   Ht 5' 10"$  (1.778 m)   Wt 161 lb 3.2 oz (73.1 kg)   SpO2 97% Comment: RA  BMI 23.13 kg/m   GEN: A/Ox3; pleasant , NAD, well nourished    HEENT:  [place]/AT,   NOSE-clear, THROAT-clear, no lesions, no postnasal drip or exudate noted.   NECK:  Supple w/ fair ROM; no JVD; normal carotid impulses w/o bruits; no thyromegaly or nodules palpated; no lymphadenopathy.    RESP  Clear  P & A; w/o, wheezes/ rales/ or rhonchi. no accessory muscle use, no dullness to percussion  CARD:  RRR, no m/r/g, no peripheral edema, pulses intact, no cyanosis or clubbing.  GI:   Soft & nt; nml bowel sounds; no organomegaly or masses detected.   Musco: Warm bil, no deformities or joint swelling noted.   Neuro: alert, no focal deficits noted.    Skin: Warm, no lesions or rashes    Lab Results:    BNP    Imaging: CT CHEST WO CONTRAST  Result Date: 09/11/2022 CLINICAL DATA:  Suspicion of aortic aneurysm. EXAM: CT CHEST WITHOUT CONTRAST TECHNIQUE: Multidetector CT imaging  of the chest was performed following the standard protocol without IV contrast. RADIATION DOSE  REDUCTION: This exam was performed according to the departmental dose-optimization program which includes automated exposure control, adjustment of the mA and/or kV according to patient size and/or use of iterative reconstruction technique. COMPARISON:  None Available. FINDINGS: Cardiovascular: Ascending thoracic aorta is normal diameter 37 mm. No pericardial fluid. Atherosclerotic calcification of the aorta. Mediastinum/Nodes: No axillary or supraclavicular adenopathy. No mediastinal or hilar adenopathy. No pericardial fluid. Esophagus normal. Lungs/Pleura: No suspicious pulmonary nodules. Normal pleural. Airways normal. Upper Abdomen: Limited view of the liver, kidneys, pancreas are unremarkable. Normal adrenal glands. High-density exophytic lesion extending from the upper pole of the LEFT kidney measures 12 mm with HU equal 86. Findings consistent with high-density benign proteinaceous cyst (Bosniak 2). No specific follow-up recommended. Musculoskeletal: No acute osseous findings. Again demonstrated low-density paravertebral cystic lesions at the level neural foramina along the RIGHT paraspinal space (image 20 and image 122). No change from comparison exam. IMPRESSION: 1. No thoracic aortic aneurysm. Ascending thoracic aorta measures 37 mm. 2. No acute pulmonary findings. 3. Benign Bosniak 2 cyst of the LEFT kidney. No specific follow-up recommended. 4. Stable benign paravertebral cystic lesions at the level of the neural foramina. 5.  Aortic Atherosclerosis (ICD10-I70.0). Electronically Signed   By: Suzy Bouchard M.D.   On: 09/11/2022 12:04          No data to display          Lab Results  Component Value Date   NITRICOXIDE 9 11/20/2021        Assessment & Plan:   Chronic obstructive pulmonary disease ([place]) Appears stable continue on current regimen  Plan  Patient Instructions  Continue on  TRELEGY 1 puff daily , rinse after use.  Delsym 2 tsp Twice daily  for cough , As needed   Tessalon Three times a day  for cough , as needed.  Continue on Nexium daily  Allegra 176m daily  Saline nasal spray Twice daily   Saline nasal gel At bedtime   Try sips of water to soothe throat and avoid throat clearing and coughing.  Xopenex inhaler or nebs As needed   Prevnar 20 booster today .  Follow up Dr. ELoanne Drillingor Lyllie Cobbins in 6 months and As needed  ([place])  Please contact office for sooner follow up if symptoms do not improve or worsen or seek emergency care     Chronic rhinitis Continue on maintenance regimen.  Continue follow-up with ENT     TRexene Edison NP 09/19/2022

## 2022-09-19 NOTE — Assessment & Plan Note (Signed)
Continue on maintenance regimen.  Continue follow-up with ENT

## 2022-09-19 NOTE — Assessment & Plan Note (Signed)
Appears stable continue on current regimen  Plan  Patient Instructions  Continue on TRELEGY 1 puff daily , rinse after use.  Delsym 2 tsp Twice daily  for cough , As needed   Tessalon Three times a day  for cough , as needed.  Continue on Nexium daily  Allegra 158m daily  Saline nasal spray Twice daily   Saline nasal gel At bedtime   Try sips of water to soothe throat and avoid throat clearing and coughing.  Xopenex inhaler or nebs As needed   Prevnar 20 booster today .  Follow up Dr. ELoanne Drillingor [place] Rollyson in 6 months and As needed  ([facility])  Please contact office for sooner follow up if symptoms do not improve or worsen or seek emergency care

## 2022-09-19 NOTE — Patient Instructions (Addendum)
Continue on TRELEGY 1 puff daily , rinse after use.  Delsym 2 tsp Twice daily  for cough , As needed   Tessalon Three times a day  for cough , as needed.  Continue on Nexium daily  Allegra 138m daily  Saline nasal spray Twice daily   Saline nasal gel At bedtime   Try sips of water to soothe throat and avoid throat clearing and coughing.  Xopenex inhaler or nebs As needed   Prevnar 20 booster today .  Follow up Dr. ELoanne Drillingor Darlen Gledhill in 6 months and As needed  ([facility])  Please contact office for sooner follow up if symptoms do not improve or worsen or seek emergency care

## 2022-09-23 DIAGNOSIS — M9901 Segmental and somatic dysfunction of cervical region: Secondary | ICD-10-CM | POA: Diagnosis not present

## 2022-09-23 DIAGNOSIS — R519 Headache, unspecified: Secondary | ICD-10-CM | POA: Diagnosis not present

## 2022-09-23 DIAGNOSIS — M9902 Segmental and somatic dysfunction of thoracic region: Secondary | ICD-10-CM | POA: Diagnosis not present

## 2022-09-23 DIAGNOSIS — S29012A Strain of muscle and tendon of back wall of thorax, initial encounter: Secondary | ICD-10-CM | POA: Diagnosis not present

## 2022-09-24 ENCOUNTER — Ambulatory Visit: Payer: PPO | Admitting: Surgery

## 2022-09-24 ENCOUNTER — Encounter: Payer: Self-pay | Admitting: Surgery

## 2022-09-24 VITALS — BP 123/67 | HR 104 | Resp 20 | Ht 70.0 in | Wt 159.0 lb

## 2022-09-24 DIAGNOSIS — I7121 Aneurysm of the ascending aorta, without rupture: Secondary | ICD-10-CM

## 2022-09-24 NOTE — Progress Notes (Signed)
HPI:  The patient is a 77 year old woman who returns for follow-up of a 4.0 cm fusiform ascending aortic aneurysm first noted on CT scan of the chest in March 2019.  I last saw her in February 2022 at which time the ascending aorta measured 3.9 cm.  She denies any chest or back pain.   Current Outpatient Medications  Medication Sig Dispense Refill   acetaminophen (TYLENOL) 500 MG tablet Take 1,000 mg by mouth at bedtime.     albuterol (PROVENTIL) (2.5 MG/3ML) 0.083% nebulizer solution Take 3 mLs (2.5 mg total) by nebulization every 6 (six) hours as needed for wheezing or shortness of breath. 75 mL 5   Azelastine HCl (ASTEPRO NA) Place 2 puffs into the nose daily.     CALCIUM CARBONATE ANTACID PO Take 1,000 mg by mouth daily.      carboxymethylcellulose (REFRESH PLUS) 0.5 % SOLN Place 2 drops into both eyes 3 (three) times daily.      cetirizine (ZYRTEC) 10 MG tablet Take 10 mg by mouth daily.     Cholecalciferol (VITAMIN D3 PO) Take 6,000 Units by mouth daily.     Cyanocobalamin (VITAMIN B-12 PO) Take 1,200 mcg by mouth daily.     denosumab (PROLIA) 60 MG/ML SOSY injection Inject 60 mg into the skin every 6 (six) months.     docusate sodium (COLACE) 100 MG capsule Take 200 mg by mouth daily.      esomeprazole (NEXIUM) 20 MG capsule Take 20 mg by mouth daily.     fexofenadine (ALLEGRA ODT) 30 MG disintegrating tablet Take 60 mg by mouth daily.     Fluticasone-Umeclidin-Vilant (TRELEGY ELLIPTA) 200-62.5-25 MCG/ACT AEPB Inhale 1 puff into the lungs daily. 1 each 0   levalbuterol (XOPENEX HFA) 45 MCG/ACT inhaler Inhale 2 puffs into the lungs every 4 (four) hours as needed for wheezing. 1 each 2   LORazepam (ATIVAN) 1 MG tablet Take 0.5-1 mg by mouth See admin instructions. Take 0.5 mg in the morning and 1.0 mg at bedtime     polyethylene glycol (MIRALAX / GLYCOLAX) packet Take 17 g by mouth daily.      senna (SENOKOT) 8.6 MG TABS tablet Take 2 tablets by mouth at bedtime.     Sodium  Chloride-Sodium Bicarb (AYR SALINE NASAL RINSE NA) Place into the nose.     SUMAtriptan (IMITREX) 50 MG tablet Take 50 mg by mouth every 2 (two) hours as needed for migraine. May repeat in 2 hours if headache persists or recurs.     traMADol (ULTRAM) 50 MG tablet Take 50 mg by mouth every 6 (six) hours as needed for moderate pain.     TRELEGY ELLIPTA 200-62.5-25 MCG/ACT AEPB INHALE 1 PUFF INTO THE LUNGS DAILY AT 2 PM. 60 each 5   No current facility-administered medications for this visit.     Physical Exam:  BP 123/67   Pulse (!) 104   Resp 20   Ht 5' 10"$  (1.778 m)   Wt 159 lb (72.1 kg)   SpO2 97% Comment: RA  BMI 22.81 kg/m  She looks well. Cardiac exam shows regular rate and rhythm with normal heart sounds.  There is no murmur. Lungs are clear. There is no peripheral edema.  Diagnostic Tests:  Narrative & Impression  CLINICAL DATA:  Suspicion of aortic aneurysm.   EXAM: CT CHEST WITHOUT CONTRAST   TECHNIQUE: Multidetector CT imaging of the chest was performed following the standard protocol without IV contrast.   RADIATION DOSE REDUCTION:  This exam was performed according to the departmental dose-optimization program which includes automated exposure control, adjustment of the mA and/or kV according to patient size and/or use of iterative reconstruction technique.   COMPARISON:  None Available.   FINDINGS: Cardiovascular: Ascending thoracic aorta is normal diameter 37 mm. No pericardial fluid. Atherosclerotic calcification of the aorta.   Mediastinum/Nodes: No axillary or supraclavicular adenopathy. No mediastinal or hilar adenopathy. No pericardial fluid. Esophagus normal.   Lungs/Pleura: No suspicious pulmonary nodules. Normal pleural. Airways normal.   Upper Abdomen: Limited view of the liver, kidneys, pancreas are unremarkable. Normal adrenal glands. High-density exophytic lesion extending from the upper pole of the LEFT kidney measures 12 mm with HU  equal 86. Findings consistent with high-density benign proteinaceous cyst (Bosniak 2). No specific follow-up recommended.   Musculoskeletal: No acute osseous findings.   Again demonstrated low-density paravertebral cystic lesions at the level neural foramina along the RIGHT paraspinal space (image 20 and image 122). No change from comparison exam.   IMPRESSION: 1. No thoracic aortic aneurysm. Ascending thoracic aorta measures 37 mm. 2. No acute pulmonary findings. 3. Benign Bosniak 2 cyst of the LEFT kidney. No specific follow-up recommended. 4. Stable benign paravertebral cystic lesions at the level of the neural foramina. 5.  Aortic Atherosclerosis (ICD10-I70.0).     Electronically Signed   By: Suzy Bouchard M.D.   On: 09/11/2022 12:04      Impression:  I have personally reviewed her CT scan of the chest and the diameter of the ascending aorta was measured at 3.9 cm.  I think radiology slightly underestimated the size.  This is unchanged from 2 years ago and still well below the surgical threshold of 5.5 cm.  I reviewed the CT scan and images with the patient and answered her questions.  I stressed the importance of continued good blood pressure control in preventing further enlargement and acute aortic dissection.  Plan:  Since this is a small aneurysm and has been stable over the past 2 years I think should be fine to wait for 3 years before repeating this.  She is in agreement.  I spent 20 minutes performing this established patient evaluation and > 50% of this time was spent face to face counseling and coordinating the care of this patient's aortic aneurysm.    Gaye Pollack, MD [facility] [phone]

## 2022-09-25 DIAGNOSIS — M9902 Segmental and somatic dysfunction of thoracic region: Secondary | ICD-10-CM | POA: Diagnosis not present

## 2022-09-25 DIAGNOSIS — R519 Headache, unspecified: Secondary | ICD-10-CM | POA: Diagnosis not present

## 2022-09-25 DIAGNOSIS — M9901 Segmental and somatic dysfunction of cervical region: Secondary | ICD-10-CM | POA: Diagnosis not present

## 2022-09-25 DIAGNOSIS — S29012A Strain of muscle and tendon of back wall of thorax, initial encounter: Secondary | ICD-10-CM | POA: Diagnosis not present

## 2022-09-30 DIAGNOSIS — M9902 Segmental and somatic dysfunction of thoracic region: Secondary | ICD-10-CM | POA: Diagnosis not present

## 2022-09-30 DIAGNOSIS — S29012A Strain of muscle and tendon of back wall of thorax, initial encounter: Secondary | ICD-10-CM | POA: Diagnosis not present

## 2022-09-30 DIAGNOSIS — M9901 Segmental and somatic dysfunction of cervical region: Secondary | ICD-10-CM | POA: Diagnosis not present

## 2022-09-30 DIAGNOSIS — R519 Headache, unspecified: Secondary | ICD-10-CM | POA: Diagnosis not present

## 2022-10-01 DIAGNOSIS — Z6823 Body mass index (BMI) 23.0-23.9, adult: Secondary | ICD-10-CM | POA: Diagnosis not present

## 2022-10-01 DIAGNOSIS — M810 Age-related osteoporosis without current pathological fracture: Secondary | ICD-10-CM | POA: Diagnosis not present

## 2022-10-02 DIAGNOSIS — Z9622 Myringotomy tube(s) status: Secondary | ICD-10-CM | POA: Diagnosis not present

## 2022-10-02 DIAGNOSIS — J320 Chronic maxillary sinusitis: Secondary | ICD-10-CM | POA: Diagnosis not present

## 2022-10-02 DIAGNOSIS — H6991 Unspecified Eustachian tube disorder, right ear: Secondary | ICD-10-CM | POA: Diagnosis not present

## 2022-10-06 DIAGNOSIS — C4441 Basal cell carcinoma of skin of scalp and neck: Secondary | ICD-10-CM | POA: Diagnosis not present

## 2022-10-10 DIAGNOSIS — M9902 Segmental and somatic dysfunction of thoracic region: Secondary | ICD-10-CM | POA: Diagnosis not present

## 2022-10-10 DIAGNOSIS — R519 Headache, unspecified: Secondary | ICD-10-CM | POA: Diagnosis not present

## 2022-10-10 DIAGNOSIS — M9901 Segmental and somatic dysfunction of cervical region: Secondary | ICD-10-CM | POA: Diagnosis not present

## 2022-10-10 DIAGNOSIS — S29012A Strain of muscle and tendon of back wall of thorax, initial encounter: Secondary | ICD-10-CM | POA: Diagnosis not present

## 2022-10-17 DIAGNOSIS — M9901 Segmental and somatic dysfunction of cervical region: Secondary | ICD-10-CM | POA: Diagnosis not present

## 2022-10-17 DIAGNOSIS — S29012A Strain of muscle and tendon of back wall of thorax, initial encounter: Secondary | ICD-10-CM | POA: Diagnosis not present

## 2022-10-17 DIAGNOSIS — R519 Headache, unspecified: Secondary | ICD-10-CM | POA: Diagnosis not present

## 2022-10-17 DIAGNOSIS — M9902 Segmental and somatic dysfunction of thoracic region: Secondary | ICD-10-CM | POA: Diagnosis not present

## 2022-10-27 DIAGNOSIS — M9901 Segmental and somatic dysfunction of cervical region: Secondary | ICD-10-CM | POA: Diagnosis not present

## 2022-10-27 DIAGNOSIS — R519 Headache, unspecified: Secondary | ICD-10-CM | POA: Diagnosis not present

## 2022-10-27 DIAGNOSIS — S29012A Strain of muscle and tendon of back wall of thorax, initial encounter: Secondary | ICD-10-CM | POA: Diagnosis not present

## 2022-10-27 DIAGNOSIS — J329 Chronic sinusitis, unspecified: Secondary | ICD-10-CM | POA: Diagnosis not present

## 2022-10-27 DIAGNOSIS — M26619 Adhesions and ankylosis of temporomandibular joint, unspecified side: Secondary | ICD-10-CM | POA: Diagnosis not present

## 2022-10-27 DIAGNOSIS — M9902 Segmental and somatic dysfunction of thoracic region: Secondary | ICD-10-CM | POA: Diagnosis not present

## 2022-11-05 DIAGNOSIS — L03211 Cellulitis of face: Secondary | ICD-10-CM | POA: Diagnosis not present

## 2022-11-05 DIAGNOSIS — F320 Major depressive disorder, single episode, mild: Secondary | ICD-10-CM | POA: Diagnosis not present

## 2022-11-05 DIAGNOSIS — G43909 Migraine, unspecified, not intractable, without status migrainosus: Secondary | ICD-10-CM | POA: Diagnosis not present

## 2022-11-06 DIAGNOSIS — Z5189 Encounter for other specified aftercare: Secondary | ICD-10-CM | POA: Diagnosis not present

## 2022-11-06 DIAGNOSIS — R21 Rash and other nonspecific skin eruption: Secondary | ICD-10-CM | POA: Diagnosis not present

## 2022-11-06 DIAGNOSIS — Z85828 Personal history of other malignant neoplasm of skin: Secondary | ICD-10-CM | POA: Diagnosis not present

## 2022-11-10 DIAGNOSIS — L739 Follicular disorder, unspecified: Secondary | ICD-10-CM | POA: Diagnosis not present

## 2022-11-10 DIAGNOSIS — L309 Dermatitis, unspecified: Secondary | ICD-10-CM | POA: Diagnosis not present

## 2022-11-11 DIAGNOSIS — M9902 Segmental and somatic dysfunction of thoracic region: Secondary | ICD-10-CM | POA: Diagnosis not present

## 2022-11-11 DIAGNOSIS — S29012A Strain of muscle and tendon of back wall of thorax, initial encounter: Secondary | ICD-10-CM | POA: Diagnosis not present

## 2022-11-11 DIAGNOSIS — M9901 Segmental and somatic dysfunction of cervical region: Secondary | ICD-10-CM | POA: Diagnosis not present

## 2022-11-11 DIAGNOSIS — J329 Chronic sinusitis, unspecified: Secondary | ICD-10-CM | POA: Diagnosis not present

## 2022-11-11 DIAGNOSIS — R519 Headache, unspecified: Secondary | ICD-10-CM | POA: Diagnosis not present

## 2022-11-11 DIAGNOSIS — M26619 Adhesions and ankylosis of temporomandibular joint, unspecified side: Secondary | ICD-10-CM | POA: Diagnosis not present

## 2022-11-25 DIAGNOSIS — J329 Chronic sinusitis, unspecified: Secondary | ICD-10-CM | POA: Diagnosis not present

## 2022-11-25 DIAGNOSIS — M9901 Segmental and somatic dysfunction of cervical region: Secondary | ICD-10-CM | POA: Diagnosis not present

## 2022-11-25 DIAGNOSIS — M26619 Adhesions and ankylosis of temporomandibular joint, unspecified side: Secondary | ICD-10-CM | POA: Diagnosis not present

## 2022-11-25 DIAGNOSIS — S29012A Strain of muscle and tendon of back wall of thorax, initial encounter: Secondary | ICD-10-CM | POA: Diagnosis not present

## 2022-11-25 DIAGNOSIS — M9902 Segmental and somatic dysfunction of thoracic region: Secondary | ICD-10-CM | POA: Diagnosis not present

## 2022-11-25 DIAGNOSIS — R519 Headache, unspecified: Secondary | ICD-10-CM | POA: Diagnosis not present

## 2022-12-03 DIAGNOSIS — J3489 Other specified disorders of nose and nasal sinuses: Secondary | ICD-10-CM | POA: Diagnosis not present

## 2022-12-03 DIAGNOSIS — J324 Chronic pansinusitis: Secondary | ICD-10-CM | POA: Diagnosis not present

## 2022-12-04 DIAGNOSIS — H90A22 Sensorineural hearing loss, unilateral, left ear, with restricted hearing on the contralateral side: Secondary | ICD-10-CM | POA: Diagnosis not present

## 2022-12-04 DIAGNOSIS — H90A31 Mixed conductive and sensorineural hearing loss, unilateral, right ear with restricted hearing on the contralateral side: Secondary | ICD-10-CM | POA: Diagnosis not present

## 2022-12-08 DIAGNOSIS — M810 Age-related osteoporosis without current pathological fracture: Secondary | ICD-10-CM | POA: Diagnosis not present

## 2022-12-10 DIAGNOSIS — R519 Headache, unspecified: Secondary | ICD-10-CM | POA: Diagnosis not present

## 2022-12-10 DIAGNOSIS — M9901 Segmental and somatic dysfunction of cervical region: Secondary | ICD-10-CM | POA: Diagnosis not present

## 2022-12-10 DIAGNOSIS — M26619 Adhesions and ankylosis of temporomandibular joint, unspecified side: Secondary | ICD-10-CM | POA: Diagnosis not present

## 2022-12-10 DIAGNOSIS — M9902 Segmental and somatic dysfunction of thoracic region: Secondary | ICD-10-CM | POA: Diagnosis not present

## 2022-12-10 DIAGNOSIS — J329 Chronic sinusitis, unspecified: Secondary | ICD-10-CM | POA: Diagnosis not present

## 2022-12-10 DIAGNOSIS — S29012A Strain of muscle and tendon of back wall of thorax, initial encounter: Secondary | ICD-10-CM | POA: Diagnosis not present

## 2022-12-17 ENCOUNTER — Other Ambulatory Visit (HOSPITAL_BASED_OUTPATIENT_CLINIC_OR_DEPARTMENT_OTHER): Payer: Self-pay | Admitting: Family Medicine

## 2022-12-17 DIAGNOSIS — R1909 Other intra-abdominal and pelvic swelling, mass and lump: Secondary | ICD-10-CM | POA: Diagnosis not present

## 2022-12-17 DIAGNOSIS — Z8719 Personal history of other diseases of the digestive system: Secondary | ICD-10-CM | POA: Diagnosis not present

## 2022-12-17 DIAGNOSIS — R1031 Right lower quadrant pain: Secondary | ICD-10-CM

## 2022-12-17 DIAGNOSIS — H90A31 Mixed conductive and sensorineural hearing loss, unilateral, right ear with restricted hearing on the contralateral side: Secondary | ICD-10-CM | POA: Diagnosis not present

## 2022-12-17 DIAGNOSIS — Z9622 Myringotomy tube(s) status: Secondary | ICD-10-CM | POA: Diagnosis not present

## 2022-12-17 DIAGNOSIS — Z9889 Other specified postprocedural states: Secondary | ICD-10-CM | POA: Diagnosis not present

## 2022-12-17 DIAGNOSIS — N760 Acute vaginitis: Secondary | ICD-10-CM | POA: Diagnosis not present

## 2022-12-23 DIAGNOSIS — R519 Headache, unspecified: Secondary | ICD-10-CM | POA: Diagnosis not present

## 2022-12-23 DIAGNOSIS — M26619 Adhesions and ankylosis of temporomandibular joint, unspecified side: Secondary | ICD-10-CM | POA: Diagnosis not present

## 2022-12-23 DIAGNOSIS — M9901 Segmental and somatic dysfunction of cervical region: Secondary | ICD-10-CM | POA: Diagnosis not present

## 2022-12-23 DIAGNOSIS — J329 Chronic sinusitis, unspecified: Secondary | ICD-10-CM | POA: Diagnosis not present

## 2022-12-23 DIAGNOSIS — M9902 Segmental and somatic dysfunction of thoracic region: Secondary | ICD-10-CM | POA: Diagnosis not present

## 2022-12-23 DIAGNOSIS — S29012A Strain of muscle and tendon of back wall of thorax, initial encounter: Secondary | ICD-10-CM | POA: Diagnosis not present

## 2022-12-25 ENCOUNTER — Ambulatory Visit (HOSPITAL_BASED_OUTPATIENT_CLINIC_OR_DEPARTMENT_OTHER)
Admission: RE | Admit: 2022-12-25 | Discharge: 2022-12-25 | Disposition: A | Discharge status: Home / Self Care | Payer: PPO | Source: Ambulatory Visit | Attending: Family Medicine | Admitting: Family Medicine

## 2022-12-25 DIAGNOSIS — R109 Unspecified abdominal pain: Secondary | ICD-10-CM | POA: Diagnosis not present

## 2022-12-25 DIAGNOSIS — R1031 Right lower quadrant pain: Secondary | ICD-10-CM | POA: Diagnosis not present

## 2022-12-25 DIAGNOSIS — I700 Atherosclerosis of aorta: Secondary | ICD-10-CM | POA: Diagnosis not present

## 2022-12-25 LAB — POCT I-STAT CREATININE: Creatinine, Ser: 0.9 mg/dL (ref 0.44–1.00)

## 2022-12-25 MED ORDER — IOHEXOL 300 MG/ML  SOLN
100.0000 mL | Freq: Once | INTRAMUSCULAR | Status: AC | PRN
  Administered 2022-12-25: 75 mL via INTRAVENOUS

## 2023-01-06 DIAGNOSIS — Z8719 Personal history of other diseases of the digestive system: Secondary | ICD-10-CM | POA: Diagnosis not present

## 2023-01-06 DIAGNOSIS — R1031 Right lower quadrant pain: Secondary | ICD-10-CM | POA: Diagnosis not present

## 2023-01-06 DIAGNOSIS — I712 Thoracic aortic aneurysm, without rupture, unspecified: Secondary | ICD-10-CM | POA: Diagnosis not present

## 2023-01-06 DIAGNOSIS — Z9889 Other specified postprocedural states: Secondary | ICD-10-CM | POA: Diagnosis not present

## 2023-01-07 DIAGNOSIS — N760 Acute vaginitis: Secondary | ICD-10-CM | POA: Diagnosis not present

## 2023-01-07 DIAGNOSIS — R350 Frequency of micturition: Secondary | ICD-10-CM | POA: Diagnosis not present

## 2023-01-13 ENCOUNTER — Encounter (HOSPITAL_BASED_OUTPATIENT_CLINIC_OR_DEPARTMENT_OTHER): Payer: Self-pay

## 2023-01-13 ENCOUNTER — Emergency Department (HOSPITAL_BASED_OUTPATIENT_CLINIC_OR_DEPARTMENT_OTHER)
Admission: EM | Admit: 2023-01-13 | Discharge: 2023-01-13 | Disposition: A | Discharge status: Home / Self Care | Payer: PPO | Attending: Emergency Medicine | Admitting: Emergency Medicine

## 2023-01-13 ENCOUNTER — Emergency Department (HOSPITAL_BASED_OUTPATIENT_CLINIC_OR_DEPARTMENT_OTHER): Payer: PPO

## 2023-01-13 ENCOUNTER — Other Ambulatory Visit: Payer: Self-pay

## 2023-01-13 DIAGNOSIS — R1084 Generalized abdominal pain: Secondary | ICD-10-CM | POA: Diagnosis not present

## 2023-01-13 DIAGNOSIS — I700 Atherosclerosis of aorta: Secondary | ICD-10-CM | POA: Diagnosis not present

## 2023-01-13 DIAGNOSIS — R1031 Right lower quadrant pain: Secondary | ICD-10-CM | POA: Diagnosis present

## 2023-01-13 DIAGNOSIS — R109 Unspecified abdominal pain: Secondary | ICD-10-CM | POA: Diagnosis not present

## 2023-01-13 DIAGNOSIS — R140 Abdominal distension (gaseous): Secondary | ICD-10-CM

## 2023-01-13 LAB — COMPREHENSIVE METABOLIC PANEL
ALT: 12 U/L (ref 0–44)
AST: 17 U/L (ref 15–41)
Albumin: 4.2 g/dL (ref 3.5–5.0)
Alkaline Phosphatase: 50 U/L (ref 38–126)
Anion gap: 8 (ref 5–15)
BUN: 12 mg/dL (ref 8–23)
CO2: 24 mmol/L (ref 22–32)
Calcium: 9.3 mg/dL (ref 8.9–10.3)
Chloride: 104 mmol/L (ref 98–111)
Creatinine, Ser: 0.85 mg/dL (ref 0.44–1.00)
GFR, Estimated: >60 mL/min (ref >60)
Glucose, Bld: 105 mg/dL — ABNORMAL HIGH (ref 70–99)
Potassium: 3.8 mmol/L (ref 3.5–5.1)
Sodium: 136 mmol/L (ref 135–145)
Total Bilirubin: 0.3 mg/dL (ref 0.3–1.2)
Total Protein: 6.5 g/dL (ref 6.5–8.1)

## 2023-01-13 LAB — URINALYSIS, ROUTINE W REFLEX MICROSCOPIC
Bilirubin Urine: NEGATIVE
Glucose, UA: NEGATIVE mg/dL
Hgb urine dipstick: NEGATIVE
Ketones, ur: NEGATIVE mg/dL
Leukocytes,Ua: NEGATIVE
Nitrite: NEGATIVE
Protein, ur: NEGATIVE mg/dL
Specific Gravity, Urine: <1.005 — ABNORMAL LOW (ref 1.005–1.030)
pH: 6 (ref 5.0–8.0)

## 2023-01-13 LAB — CBC
HCT: 40.5 % (ref 36.0–46.0)
Hemoglobin: 13.6 g/dL (ref 12.0–15.0)
MCH: 29.8 pg (ref 26.0–34.0)
MCHC: 33.6 g/dL (ref 30.0–36.0)
MCV: 88.8 fL (ref 80.0–100.0)
Platelets: 230 10*3/uL (ref 150–400)
RBC: 4.56 MIL/uL (ref 3.87–5.11)
RDW: 13.1 % (ref 11.5–15.5)
WBC: 7.2 10*3/uL (ref 4.0–10.5)
nRBC: 0 % (ref 0.0–0.2)

## 2023-01-13 LAB — LIPASE, BLOOD: Lipase: 25 U/L (ref 11–51)

## 2023-01-13 MED ORDER — DICYCLOMINE HCL 20 MG PO TABS: 20.0000 mg | ORAL_TABLET | Freq: Two times a day (BID) | ORAL | 0 refills | Status: DC | PRN

## 2023-01-13 MED ORDER — ONDANSETRON HCL 4 MG/2ML IJ SOLN
4.0000 mg | Freq: Once | INTRAMUSCULAR | Status: AC
  Administered 2023-01-13: 4 mg via INTRAVENOUS
  Filled 2023-01-13: qty 2

## 2023-01-13 MED ORDER — SODIUM CHLORIDE 0.9 % IV BOLUS
1000.0000 mL | Freq: Once | INTRAVENOUS | Status: AC
  Administered 2023-01-13: 1000 mL via INTRAVENOUS

## 2023-01-13 MED ORDER — MORPHINE SULFATE (PF) 4 MG/ML IV SOLN
4.0000 mg | Freq: Once | INTRAVENOUS | Status: AC
  Administered 2023-01-13: 4 mg via INTRAVENOUS
  Filled 2023-01-13: qty 1

## 2023-01-13 MED ORDER — IOHEXOL 300 MG/ML  SOLN
100.0000 mL | Freq: Once | INTRAMUSCULAR | Status: AC | PRN
  Administered 2023-01-13: 75 mL via INTRAVENOUS

## 2023-01-13 NOTE — ED Triage Notes (Signed)
Patient here POV from Home.  Endorses RLQ ABD Pain that has been present for a few weeks. Had a CAT Scan completed 3 Weeks ago which was unremarkable and seeks Evaluation for continued Pain. Due to see Surgery but is pending appointment.  History of ABD Surgeries.   NAD Noted during Triage. A&Ox4. GCS 15. Ambulatory.

## 2023-01-13 NOTE — ED Provider Notes (Signed)
  Physical Exam  BP 136/60   Pulse (!) 54   Temp (!) 96.8 F (36 C) (Temporal)   Resp 18   Ht 5\' 10"  (1.778 m)   Wt 72.1 kg   SpO2 100%   BMI 22.81 kg/m   Physical Exam  Procedures  Procedures  ED Course / MDM    Medical Decision Making Care assumed at 3 PM.  Patient is here with abdominal distention and lower abdominal pain.  Signed out pending CT abdomen pelvis.  3:47 PM I reviewed patient's labs and independently interpreted imaging studies.  CT showed no obstruction.  Patient has diffusely stool-filled colon may indicate constipation.  However patient states that she has been having bowel movement 3 times a day and is been on MiraLAX and Dulcolax.  At this point I will prescribe Bentyl as needed for cramps.  Will refer her back to [facility]  Problems Addressed: Abdominal distention: acute illness or injury  Amount and/or Complexity of Data Reviewed Labs: ordered. Decision-making details documented in ED Course. Radiology: ordered and independent interpretation performed. Decision-making details documented in ED Course.  Risk Prescription drug management.          Charlynne Pander, MD 01/13/23 [phone]

## 2023-01-13 NOTE — Discharge Instructions (Addendum)
As we discussed, your CT scan was unremarkable today.  You have some mild constipation but you are already on MiraLAX and Dulcolax so please continue taking them  I have prescribed Bentyl as needed  Please stay hydrated and see [facility] for follow up   Return to ER if you have worse abdominal pain or distention or vomiting or fever

## 2023-01-13 NOTE — ED Provider Notes (Signed)
[place] [facility]Y DEPARTMENT AT [facility] Provider Note   CSN: [id number] Arrival date & time: 01/13/23  1210     History {Add pertinent medical, surgical, social history, OB history to HPI:1} Chief Complaint  Patient presents with   Abdominal Pain    ESM… FLAMMER is a 77 y.o. female.  77 yo F with a cc of RLQ abdominal pain.  Going on for the better part of the month.  She had been seen by her family doctor and had CT imaging performed.  There is no obvious cause of her discomfort and she has been managed conservatively.  Still having ongoing right lower quadrant abdominal pain.  Seems to come and go.  Described as sharp.  No vomiting no fevers.  No diarrhea.-   Abdominal Pain      Home Medications Prior to Admission medications   Medication Sig Start Date End Date Taking? Authorizing Provider  acetaminophen (TYLENOL) 500 MG tablet Take 1,000 mg by mouth at bedtime.    [provider]  albuterol (PROVENTIL) (2.5 MG/3ML) 0.083% nebulizer solution Take 3 mLs (2.5 mg total) by nebulization every 6 (six) hours as needed for wheezing or shortness of breath. 12/31/21 12/31/22  Parrett, Virgel Bouquet, NP  Azelastine HCl (ASTEPRO NA) Place 2 puffs into the nose daily.    [provider]  CALCIUM CARBONATE ANTACID PO Take 1,000 mg by mouth daily.     [provider]  carboxymethylcellulose (REFRESH PLUS) 0.5 % SOLN Place 2 drops into both eyes 3 (three) times daily.     [provider]  cetirizine (ZYRTEC) 10 MG tablet Take 10 mg by mouth daily.    [provider]  Cholecalciferol (VITAMIN D3 PO) Take 6,000 Units by mouth daily.    [provider]  Cyanocobalamin (VITAMIN B-12 PO) Take 1,200 mcg by mouth daily.    [provider]  denosumab (PROLIA) 60 MG/ML SOSY injection Inject 60 mg into the skin every 6 (six) months.    [provider]  docusate sodium (COLACE) 100 MG capsule Take 200 mg by mouth daily.      [provider]  esomeprazole (NEXIUM) 20 MG capsule Take 20 mg by mouth daily.    [provider]  fexofenadine (ALLEGRA ODT) 30 MG disintegrating tablet Take 60 mg by mouth daily.    [provider]  Fluticasone-Umeclidin-Vilant (TRELEGY ELLIPTA) 200-62.5-25 MCG/ACT AEPB Inhale 1 puff into the lungs daily. 05/20/22   Parrett, Virgel Bouquet, NP  levalbuterol (XOPENEX HFA) 45 MCG/ACT inhaler Inhale 2 puffs into the lungs every 4 (four) hours as needed for wheezing. 01/16/22   Luciano Cutter, MD  LORazepam (ATIVAN) 1 MG tablet Take 0.5-1 mg by mouth See admin instructions. Take 0.5 mg in the morning and 1.0 mg at bedtime    [provider]  polyethylene glycol (MIRALAX / GLYCOLAX) packet Take 17 g by mouth daily.     [provider]  senna (SENOKOT) 8.6 MG TABS tablet Take 2 tablets by mouth at bedtime.    [provider]  Sodium Chloride-Sodium Bicarb (AYR SALINE NASAL RINSE NA) Place into the nose.    [provider]  SUMAtriptan (IMITREX) 50 MG tablet Take 50 mg by mouth every 2 (two) hours as needed for migraine. May repeat in 2 hours if headache persists or recurs.    [provider]  traMADol (ULTRAM) 50 MG tablet Take 50 mg by mouth every 6 (six) hours as needed for moderate  pain.    [provider]  Dwyane Luo 200-62.5-25 MCG/ACT AEPB INHALE 1 PUFF INTO THE LUNGS DAILY AT 2 PM. 07/30/22   Luciano Cutter, MD      Allergies    Ciprofloxacin hcl, Metoprolol, Penicillins, Oxycodone, Advair diskus [fluticasone-salmeterol], and Fosamax [alendronate]    Review of Systems   Review of Systems  Gastrointestinal:  Positive for abdominal pain.    Physical Exam Updated Vital Signs BP (!) 174/98 (BP Location: Right Arm)   Pulse 83   Temp (!) 96.8 F (36 C) (Temporal)   Resp 18   Ht 5\' 10"  (1.778 m)   Wt 72.1 kg   SpO2 100%   BMI 22.81 kg/m  Physical Exam Vitals and nursing note reviewed.   Constitutional:      General: She is not in acute distress.    Appearance: She is well-developed. She is not diaphoretic.  HENT:     Head: Normocephalic and atraumatic.  Eyes:     Pupils: Pupils are equal, round, and reactive to light.  Cardiovascular:     Rate and Rhythm: Normal rate and regular rhythm.     Heart sounds: No murmur heard.    No friction rub. No gallop.  Pulmonary:     Effort: Pulmonary effort is normal.     Breath sounds: No wheezing or rales.  Abdominal:     General: There is no distension.     Palpations: Abdomen is soft.     Tenderness: There is abdominal tenderness.     Comments: Mild diffuse abdominal discomfort.  Musculoskeletal:        General: No tenderness.     Cervical back: Normal range of motion and neck supple.  Skin:    General: Skin is warm and dry.  Neurological:     Mental Status: She is alert and oriented to person, place, and time.  Psychiatric:        Behavior: Behavior normal.     ED Results / Procedures / Treatments   Labs (all labs ordered are listed, but only abnormal results are displayed) Labs Reviewed  COMPREHENSIVE METABOLIC PANEL - Abnormal; Notable for the following components:      Result Value   Glucose, Bld 105 (*)    All other components within normal limits  LIPASE, BLOOD  CBC  URINALYSIS, ROUTINE W REFLEX MICROSCOPIC    EKG None  Radiology No results found.  Procedures Procedures  {Document cardiac monitor, telemetry assessment procedure when appropriate:1}  Medications Ordered in ED Medications  iohexol (OMNIPAQUE) 300 MG/ML solution 100 mL (has no administration in time range)  sodium chloride 0.9 % bolus 1,000 mL (1,000 mLs Intravenous New Bag/Given 01/13/23 1230)  morphine (PF) 4 MG/ML injection 4 mg (4 mg Intravenous Given 01/13/23 1232)  ondansetron (ZOFRAN) injection 4 mg (4 mg Intravenous Given 01/13/23 1231)    ED Course/ Medical Decision Making/ A&P   {   Click here for ABCD2, HEART and  other calculatorsREFRESH Note before signing :1}                          Medical Decision Making Amount and/or Complexity of Data Reviewed Labs: ordered. Radiology: ordered.  Risk Prescription drug management.   77 yo F with a chief complaint of right-sided abdominal pain.  Is been going on for at least 3 weeks.  She had been seen by her family doctor who had ordered an outpatient CT scan.  There is  no obvious cause for her discomfort and the referrals been made.  She separately had seen her OB/GYN and they felt like she had some drop to her bladder and had had some signs of vaginal irritation on exam.  She has not yet seen another provider.  Like her pain has persisted and slightly worsened and so came to the ED for evaluation.  No fevers no vomiting.  Will obtain repeat CT imaging here.  Blood work.  Treat pain and nausea.  Reassess.  {Document critical care time when appropriate:1} {Document review of labs and clinical decision tools ie heart score, Chads2Vasc2 etc:1}  {Document your independent review of radiology images, and any outside records:1} {Document your discussion with family members, caretakers, and with consultants:1} {Document social determinants of health affecting pt's care:1} {Document your decision making why or why not admission, treatments were needed:1} Final Clinical Impression(s) / ED Diagnoses Final diagnoses:  None    Rx / DC Orders ED Discharge Orders     None

## 2023-01-21 DIAGNOSIS — H7291 Unspecified perforation of tympanic membrane, right ear: Secondary | ICD-10-CM | POA: Diagnosis not present

## 2023-01-21 DIAGNOSIS — H90A31 Mixed conductive and sensorineural hearing loss, unilateral, right ear with restricted hearing on the contralateral side: Secondary | ICD-10-CM | POA: Diagnosis not present

## 2023-01-22 DIAGNOSIS — K6289 Other specified diseases of anus and rectum: Secondary | ICD-10-CM | POA: Diagnosis not present

## 2023-01-22 DIAGNOSIS — K648 Other hemorrhoids: Secondary | ICD-10-CM | POA: Diagnosis not present

## 2023-01-22 DIAGNOSIS — K625 Hemorrhage of anus and rectum: Secondary | ICD-10-CM | POA: Diagnosis not present

## 2023-01-22 DIAGNOSIS — R1031 Right lower quadrant pain: Secondary | ICD-10-CM | POA: Diagnosis not present

## 2023-01-27 DIAGNOSIS — H90A22 Sensorineural hearing loss, unilateral, left ear, with restricted hearing on the contralateral side: Secondary | ICD-10-CM | POA: Diagnosis not present

## 2023-01-27 DIAGNOSIS — H90A31 Mixed conductive and sensorineural hearing loss, unilateral, right ear with restricted hearing on the contralateral side: Secondary | ICD-10-CM | POA: Diagnosis not present

## 2023-01-27 DIAGNOSIS — H7291 Unspecified perforation of tympanic membrane, right ear: Secondary | ICD-10-CM | POA: Diagnosis not present

## 2023-02-06 DIAGNOSIS — R1031 Right lower quadrant pain: Secondary | ICD-10-CM | POA: Diagnosis not present

## 2023-02-06 DIAGNOSIS — D123 Benign neoplasm of transverse colon: Secondary | ICD-10-CM | POA: Diagnosis not present

## 2023-02-06 DIAGNOSIS — K6389 Other specified diseases of intestine: Secondary | ICD-10-CM | POA: Diagnosis not present

## 2023-02-06 DIAGNOSIS — D125 Benign neoplasm of sigmoid colon: Secondary | ICD-10-CM | POA: Diagnosis not present

## 2023-02-06 DIAGNOSIS — K5730 Diverticulosis of large intestine without perforation or abscess without bleeding: Secondary | ICD-10-CM | POA: Diagnosis not present

## 2023-02-06 DIAGNOSIS — D120 Benign neoplasm of cecum: Secondary | ICD-10-CM | POA: Diagnosis not present

## 2023-02-06 DIAGNOSIS — Z86010 Personal history of colonic polyps: Secondary | ICD-10-CM | POA: Diagnosis not present

## 2023-02-06 DIAGNOSIS — K648 Other hemorrhoids: Secondary | ICD-10-CM | POA: Diagnosis not present

## 2023-02-06 DIAGNOSIS — D122 Benign neoplasm of ascending colon: Secondary | ICD-10-CM | POA: Diagnosis not present

## 2023-02-06 DIAGNOSIS — K625 Hemorrhage of anus and rectum: Secondary | ICD-10-CM | POA: Diagnosis not present

## 2023-02-06 DIAGNOSIS — D124 Benign neoplasm of descending colon: Secondary | ICD-10-CM | POA: Diagnosis not present

## 2023-02-09 ENCOUNTER — Other Ambulatory Visit: Payer: Self-pay | Admitting: Pulmonary Disease

## 2023-02-10 DIAGNOSIS — D125 Benign neoplasm of sigmoid colon: Secondary | ICD-10-CM | POA: Diagnosis not present

## 2023-02-10 DIAGNOSIS — K6389 Other specified diseases of intestine: Secondary | ICD-10-CM | POA: Diagnosis not present

## 2023-02-10 DIAGNOSIS — F418 Other specified anxiety disorders: Secondary | ICD-10-CM | POA: Diagnosis not present

## 2023-02-10 DIAGNOSIS — Z8719 Personal history of other diseases of the digestive system: Secondary | ICD-10-CM | POA: Diagnosis not present

## 2023-02-10 DIAGNOSIS — Z9889 Other specified postprocedural states: Secondary | ICD-10-CM | POA: Diagnosis not present

## 2023-02-10 DIAGNOSIS — D122 Benign neoplasm of ascending colon: Secondary | ICD-10-CM | POA: Diagnosis not present

## 2023-02-10 DIAGNOSIS — D123 Benign neoplasm of transverse colon: Secondary | ICD-10-CM | POA: Diagnosis not present

## 2023-02-10 DIAGNOSIS — D124 Benign neoplasm of descending colon: Secondary | ICD-10-CM | POA: Diagnosis not present

## 2023-02-10 DIAGNOSIS — D120 Benign neoplasm of cecum: Secondary | ICD-10-CM | POA: Diagnosis not present

## 2023-02-10 DIAGNOSIS — R1031 Right lower quadrant pain: Secondary | ICD-10-CM | POA: Diagnosis not present

## 2023-02-10 DIAGNOSIS — K635 Polyp of colon: Secondary | ICD-10-CM | POA: Diagnosis not present

## 2023-02-11 ENCOUNTER — Encounter (HOSPITAL_BASED_OUTPATIENT_CLINIC_OR_DEPARTMENT_OTHER): Payer: Self-pay | Admitting: Pulmonary Disease

## 2023-02-11 ENCOUNTER — Telehealth: Payer: Self-pay | Admitting: Pulmonary Disease

## 2023-02-11 DIAGNOSIS — R102 Pelvic and perineal pain: Secondary | ICD-10-CM | POA: Diagnosis not present

## 2023-02-11 MED ORDER — TRELEGY ELLIPTA 200-62.5-25 MCG/ACT IN AEPB: 1.0000 | INHALATION_SPRAY | Freq: Every day | RESPIRATORY_TRACT | 11 refills | Status: AC

## 2023-02-11 NOTE — Telephone Encounter (Signed)
Refilled Trelegy.

## 2023-03-03 ENCOUNTER — Other Ambulatory Visit (HOSPITAL_BASED_OUTPATIENT_CLINIC_OR_DEPARTMENT_OTHER): Payer: Self-pay | Admitting: Family Medicine

## 2023-03-03 DIAGNOSIS — Z1231 Encounter for screening mammogram for malignant neoplasm of breast: Secondary | ICD-10-CM

## 2023-03-06 DIAGNOSIS — H903 Sensorineural hearing loss, bilateral: Secondary | ICD-10-CM | POA: Diagnosis not present

## 2023-03-11 DIAGNOSIS — H60501 Unspecified acute noninfective otitis externa, right ear: Secondary | ICD-10-CM | POA: Diagnosis not present

## 2023-03-16 ENCOUNTER — Encounter (HOSPITAL_BASED_OUTPATIENT_CLINIC_OR_DEPARTMENT_OTHER): Payer: PPO | Admitting: Radiology

## 2023-03-16 DIAGNOSIS — Z1231 Encounter for screening mammogram for malignant neoplasm of breast: Secondary | ICD-10-CM

## 2023-03-17 ENCOUNTER — Other Ambulatory Visit: Payer: Self-pay | Admitting: Family Medicine

## 2023-03-17 DIAGNOSIS — Z1231 Encounter for screening mammogram for malignant neoplasm of breast: Secondary | ICD-10-CM

## 2023-03-19 ENCOUNTER — Encounter (HOSPITAL_BASED_OUTPATIENT_CLINIC_OR_DEPARTMENT_OTHER): Payer: Self-pay | Admitting: Pulmonary Disease

## 2023-03-19 ENCOUNTER — Ambulatory Visit (HOSPITAL_BASED_OUTPATIENT_CLINIC_OR_DEPARTMENT_OTHER): Payer: PPO | Admitting: Pulmonary Disease

## 2023-03-19 ENCOUNTER — Ambulatory Visit (HOSPITAL_BASED_OUTPATIENT_CLINIC_OR_DEPARTMENT_OTHER): Payer: PPO | Admitting: Radiology

## 2023-03-19 ENCOUNTER — Encounter (HOSPITAL_BASED_OUTPATIENT_CLINIC_OR_DEPARTMENT_OTHER): Payer: Self-pay

## 2023-03-19 VITALS — BP 112/76 | HR 75 | Resp 16 | Ht 70.0 in | Wt 159.2 lb

## 2023-03-19 DIAGNOSIS — J449 Chronic obstructive pulmonary disease, unspecified: Secondary | ICD-10-CM

## 2023-03-19 DIAGNOSIS — H9201 Otalgia, right ear: Secondary | ICD-10-CM | POA: Diagnosis not present

## 2023-03-19 DIAGNOSIS — U099 Post covid-19 condition, unspecified: Secondary | ICD-10-CM

## 2023-03-19 NOTE — Patient Instructions (Signed)
COPD - well-controlled COVID-19 long hauler --CONTINUE Trelegy 200 ONE puff ONCE a day. --CONTINUE Xopenex for AS NEEDED   Nasal/ear congestion - worsened since COVID --CONTINUE Zyrtec 10 mg daily --Keep ENT follow-up

## 2023-03-19 NOTE — Progress Notes (Signed)
Subjective:   PATIENT ID: Catherine [name] GENDER: female DOB: [birth year], MRN: [id number]   HPI  Chief Complaint  Patient presents with   Follow-up    6 month FU, COPD , Stable on Trelegy    Reason for Visit: Follow-up  Ms. Catherine [name] is a 77 year old female with COPD, sensory hearing loss and osteopenia who presents for follow-up  Initial consult Her main symptom is shortness of breath that occurs constantly with rest and activity. Worsens with changes in hot or cold weather. Does improve with rescue inhaler. She is wanting assistance in deciding the appropriate inhaler for her symptoms. Previously on Advair and switched to Symbicort. Ignacia Palma for one year however had oral thrush/mouth sores. Treated in December 2022. Currently on Anoro and requires Albuterol 3-4 times a week for shortness of breath and chest tightness. No wheezing or cough.  She was seen by her PCP on 10/02/21 and reported that Anoro was not as effective as she hoped.  01/16/22 Since our last visit she has had multiple COPD/sinus exacerbations since her COVID-19 infection March 2023. Total of four antibiotics and four prednisone courses.She was transitioned from Symbicort to Trelegy. She currently reports with nasal congestions. Scheduled with ENT wit PA Scot Jun. Cough is triggered by post-nasal drainage. Compliant with Trelegy. Breathing issues can be triggered in the heat. Still using albuterol once a day  03/19/23 Since our last visit she reports that has been stable on Trelegy. Denies shortness of breath, cough or wheezing. Prefers xopenx which she rarely uses. Last exacerbation in September She does report some chronic right ear congestion previously requiring tube in Feb s/p removal. Has distorted her hearing. Has been following ENT, Dr. Jenne Pane.  Prior inhalers: Advair Symbicort Breztri - thrush/mouth sores Anoro - ineffective Currently on Trelegy - stable symptoms   Social History: Quit  smoking 4 years ago 36 pack years.   Past Medical History:  Diagnosis Date   Abdominal pain 06/06/2019   Anxiety    Aortic aneurysm (HCC) 12/08/2018   Arthritis    HANDS   BCC (basal cell carcinoma) 04/07/1995   right jawline (CX3, 5FU)   BCC (basal cell carcinoma) 05/19/2013   right scalp/post (treatment biopsy)   Chronic constipation    Complication of anesthesia    upon waking up from anesthesia patient experienced chest pain - EKG done was negative, patient states "it may have been indigestion"   COPD (chronic obstructive pulmonary disease) (HCC)    DDD (degenerative disc disease), lumbar    L2-3/3-4/4-5   Dizziness 01/17/2019   DOE (dyspnea on exertion) 01/17/2019   Dysplastic nevus 12/04/2009   mod-severe left forearm, exc   Dysuria    Frequency of urination    Ganglion cyst of right groin 11/23/2018   GERD (gastroesophageal reflux disease)    Hearing aid worn    Infected hernioplasty mesh (HCC) 01/26/2019   Keratosis, actinic 02/18/1991   left abdomen   Migraine    Mild emphysema (HCC)    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 HUMPHRIES/GRAPEY   Wears glasses      Family History  Problem Relation Age  of Onset   Esophageal cancer Mother 71   Lung cancer Father 6   Breast cancer Maternal Aunt      Social History   Occupational History   Not on file  Tobacco Use   Smoking status: Former    Current packs/day: 0.00    Average packs/day: 0.8 packs/day for 48.0 years (36.0 ttl pk-yrs)    Types: Cigarettes    Start date: 09/04/1969    Quit date: 09/04/2017    Years since quitting: 5.5   Smokeless tobacco: Never  Vaping Use   Vaping status: Never Used  Substance and Sexual  Activity   Alcohol use: No   Drug use: No   Sexual activity: Not on file    Allergies  Allergen Reactions   Ciprofloxacin Hcl Other (See Comments)    Aortic aneurysm per MD   Metoprolol Other (See Comments) and Cough    and wheezing   Penicillins Shortness Of Breath, Rash and Other (See Comments)    Has patient had a PCN reaction causing immediate rash, facial/tongue/throat swelling, SOB or lightheadedness with hypotension: Yes Has patient had a PCN reaction causing severe rash involving mucus membranes or skin necrosis: No Has patient had a PCN reaction that required hospitalization: Yes - MD office Has patient had a PCN reaction occurring within the last 10 years: No If all of the above answers are "NO", then may proceed with Cephalosporin use.    Oxycodone Other (See Comments)    Nightmares.  Tolerates tramadol   Advair Diskus [Fluticasone-Salmeterol] Anxiety   Fosamax [Alendronate] Other (See Comments)    Other reaction(s): GERD     Outpatient Medications Prior to Visit  Medication Sig Dispense Refill   acetaminophen (TYLENOL) 500 MG tablet Take 1,000 mg by mouth at bedtime.     albuterol (PROVENTIL) (2.5 MG/3ML) 0.083% nebulizer solution Take 3 mLs (2.5 mg total) by nebulization every 6 (six) hours as needed for wheezing or shortness of breath. 75 mL 5   Azelastine HCl (ASTEPRO NA) Place 2 puffs into the nose daily.     CALCIUM CARBONATE ANTACID PO Take 1,000 mg by mouth daily.      carboxymethylcellulose (REFRESH PLUS) 0.5 % SOLN Place 2 drops into both eyes 3 (three) times daily.      Cholecalciferol (VITAMIN D3 PO) Take 6,000 Units by mouth daily.     Cyanocobalamin (VITAMIN B-12 PO) Take 1,200 mcg by mouth daily.     docusate sodium (COLACE) 100 MG capsule Take 200 mg by mouth daily.      esomeprazole (NEXIUM) 20 MG capsule Take 20 mg by mouth daily.     fexofenadine (ALLEGRA ODT) 30 MG disintegrating tablet Take 60 mg by mouth daily.     Fluticasone-Umeclidin-Vilant  (TRELEGY ELLIPTA) 200-62.5-25 MCG/ACT AEPB Inhale 1 puff into the lungs daily. 1 each 11   gabapentin (NEURONTIN) 100 MG capsule Take 100 mg by mouth daily.     levalbuterol (XOPENEX HFA) 45 MCG/ACT inhaler Inhale 2 puffs into the lungs every 4 (four) hours as needed for wheezing. 1 each 2   LORazepam (ATIVAN) 1 MG tablet Take 0.5-1 mg by mouth See admin instructions. Take 0.5 mg in the morning and 1.0 mg at bedtime     polyethylene glycol (MIRALAX / GLYCOLAX) packet Take 17 g by mouth daily.      senna (SENOKOT) 8.6 MG TABS tablet Take 2 tablets by mouth at bedtime.     Sodium Chloride-Sodium Bicarb (AYR SALINE NASAL RINSE NA) Place  into the nose.     SUMAtriptan (IMITREX) 50 MG tablet Take 50 mg by mouth every 2 (two) hours as needed for migraine. May repeat in 2 hours if headache persists or recurs.     traMADol (ULTRAM) 50 MG tablet Take 50 mg by mouth every 6 (six) hours as needed for moderate pain.     zoledronic acid (RECLAST) 5 MG/100ML SOLN injection Inject 5 mg into the vein.     cetirizine (ZYRTEC) 10 MG tablet Take 10 mg by mouth daily.     denosumab (PROLIA) 60 MG/ML SOSY injection Inject 60 mg into the skin every 6 (six) months.     dicyclomine (BENTYL) 20 MG tablet Take 1 tablet (20 mg total) by mouth 2 (two) times daily as needed for spasms. 20 tablet 0   No facility-administered medications prior to visit.    Review of Systems  Constitutional:  Negative for chills, diaphoresis, fever, malaise/fatigue and weight loss.  HENT:  Positive for ear pain. Negative for congestion.   Respiratory:  Negative for cough, hemoptysis, sputum production, shortness of breath and wheezing.   Cardiovascular:  Negative for chest pain, palpitations and leg swelling.     Objective:   Vitals:   03/19/23 0938  BP: 112/76  Pulse: 75  Resp: 16  SpO2: 98%  Weight: 159 lb 3.2 oz (72.2 kg)  Height: 5\' 10"  (1.778 m)   SpO2: 98 %  Physical Exam: General: Well-appearing, no acute  distress HENT: [place], AT, right ear with cerumen impaction, no visible erythema, no external tenderness Eyes: EOMI, no scleral icterus Respiratory: Clear to auscultation bilaterally.  No crackles, wheezing or rales Cardiovascular: RRR, -M/R/G, no JVD Extremities:-Edema,-tenderness Neuro: AAO x4, CNII-XII grossly intact Psych: Normal mood, normal affect  Data Reviewed:  Imaging: CT Chest 09/05/20 - Mild centrilobular emphysema. LLL solid 3 mm nodule along left major fissure, stable compared to 01/18/19 CTA CT Chest 01/16/22 - Mild centrilobular emphysema. Unchanged mild linear scarring in RML. Stable LLL 3 mm solid nodule. Unchanged thoracic lesions  CT Chest 09/11/22 - Unchanged  PFT: 12/24/22 PRE 2 Efforts FVC FEV1 FEV1/FVC FEF25% FEF50%  Note   Predicted 3.50 2.64 74% 5.59 2.52    LLN 2.68 1.94 65% 3.07 1.27    1EFFORT 3.17  90% 2.12  80% 67%  90% 3.10  55% 2.06  82%     2BEST 3.36  96% 2.25  85% 67%  90% 4.09  73% 2.36  93%     Interpretation: Normal spirometry  Labs: CBC    Component Value Date/Time   WBC 7.2 01/13/2023 1217   RBC 4.56 01/13/2023 1217   HGB 13.6 01/13/2023 1217   HCT 40.5 01/13/2023 1217   PLT 230 01/13/2023 1217   MCV 88.8 01/13/2023 1217   MCH 29.8 01/13/2023 1217   MCHC 33.6 01/13/2023 1217   RDW 13.1 01/13/2023 1217   LYMPHSABS 1.4 12/24/2021 1309   MONOABS 0.4 12/24/2021 1309   EOSABS 0.1 12/24/2021 1309   BASOSABS 0.0 12/24/2021 1309   Absolute eos 12/24/21 -100    Assessment & Plan:   Discussion: 77 year old female with COPD, sensory hearing loss and osteopenia who presents for follow-up. Previously diagnosed with covid in 10/2021 with persistent symptoms despite multiple courses of antibiotics and steroids. Has trialed multiple inhalers and currently well-controlled on Trelegy. Last exacerbations in 04/2022, nearly one year ago.   COPD - well controlled COVID-19 long hauler - improving/resolving --CONTINUE Trelegy 200 ONE puff ONCE a  day. --  CONTINUE Xopenex for AS NEEDED --Ok to return to PCP and follow-up as needed  Nasal/ear congestion - worsened since COVID --CONTINUE Zyrtec 10 mg daily --Keep ENT follow-up  Health Maintenance Immunization History  Administered Date(s) Administered   DTaP 05/09/2006   Fluad Quad(high Dose 65+) 05/05/2019   H1N1 05/26/2008   Influenza Split 05/20/2008, 05/29/2009, 04/24/2010, 05/13/2011, 04/20/2012, 05/13/2013   Influenza, High Dose Seasonal PF 05/14/2021   Influenza,inj,Quad PF,6+ Mos 04/15/2016, 03/27/2017, 05/06/2018, 05/10/2019, 05/08/2020   Influenza,inj,quad, With Preservative 04/27/2014, 04/27/2015   Influenza-Unspecified 04/04/2014, 05/06/2018   Moderna Covid-19 Vaccine Bivalent Booster 44yrs & up 05/20/2021   Moderna SARS-COV2 Booster Vaccination 06/06/2020, 12/04/2020   Moderna Sars-Covid-2 Vaccination 09/02/2019, 09/30/2019, 06/05/2020, 12/03/2020   PNEUMOCOCCAL CONJUGATE-20 09/19/2022   Pneumococcal Conjugate-13 04/04/2014   Pneumococcal Polysaccharide-23 06/06/2009   Pneumococcal-Unspecified 06/06/2009, 04/04/2014   Td 09/10/2018   Tdap 09/10/2018   Zoster, Live 07/11/2009, 03/12/2021, 08/13/2021   CT Lung Screen- discuss at next visit  No orders of the defined types were placed in this encounter.  No orders of the defined types were placed in this encounter.   Return if symptoms worsen or fail to improve.  I have spent a total time of 30-minutes on the day of the appointment including chart review, data review, collecting history, coordinating care and discussing medical diagnosis and plan with the patient/family. Past medical history, allergies, medications were reviewed. Pertinent imaging, labs and tests included in this note have been reviewed and interpreted independently by me.   Mechele Collin, MD [facility] 03/19/2023 11:00 AM  Office Number [phone]

## 2023-03-21 DIAGNOSIS — J329 Chronic sinusitis, unspecified: Secondary | ICD-10-CM | POA: Diagnosis not present

## 2023-03-21 DIAGNOSIS — H6121 Impacted cerumen, right ear: Secondary | ICD-10-CM | POA: Diagnosis not present

## 2023-03-21 DIAGNOSIS — H60311 Diffuse otitis externa, right ear: Secondary | ICD-10-CM | POA: Diagnosis not present

## 2023-04-02 ENCOUNTER — Ambulatory Visit
Admission: RE | Admit: 2023-04-02 | Discharge: 2023-04-02 | Disposition: A | Discharge status: Home / Self Care | Payer: PPO | Source: Ambulatory Visit

## 2023-04-02 DIAGNOSIS — Z1231 Encounter for screening mammogram for malignant neoplasm of breast: Secondary | ICD-10-CM | POA: Diagnosis not present

## 2023-04-14 DIAGNOSIS — Z0110 Encounter for examination of ears and hearing without abnormal findings: Secondary | ICD-10-CM | POA: Diagnosis not present

## 2023-04-14 DIAGNOSIS — M2669 Other specified disorders of temporomandibular joint: Secondary | ICD-10-CM | POA: Diagnosis not present

## 2023-04-14 DIAGNOSIS — H903 Sensorineural hearing loss, bilateral: Secondary | ICD-10-CM | POA: Diagnosis not present

## 2023-04-20 ENCOUNTER — Ambulatory Visit: Payer: PPO

## 2023-05-06 DIAGNOSIS — Z79899 Other long term (current) drug therapy: Secondary | ICD-10-CM | POA: Diagnosis not present

## 2023-05-06 DIAGNOSIS — E559 Vitamin D deficiency, unspecified: Secondary | ICD-10-CM | POA: Diagnosis not present

## 2023-05-06 DIAGNOSIS — E538 Deficiency of other specified B group vitamins: Secondary | ICD-10-CM | POA: Diagnosis not present

## 2023-05-06 DIAGNOSIS — I10 Essential (primary) hypertension: Secondary | ICD-10-CM | POA: Diagnosis not present

## 2023-05-08 DIAGNOSIS — E559 Vitamin D deficiency, unspecified: Secondary | ICD-10-CM | POA: Diagnosis not present

## 2023-05-08 DIAGNOSIS — Z23 Encounter for immunization: Secondary | ICD-10-CM | POA: Diagnosis not present

## 2023-05-08 DIAGNOSIS — F419 Anxiety disorder, unspecified: Secondary | ICD-10-CM | POA: Diagnosis not present

## 2023-05-08 DIAGNOSIS — I10 Essential (primary) hypertension: Secondary | ICD-10-CM | POA: Diagnosis not present

## 2023-05-08 DIAGNOSIS — Z79899 Other long term (current) drug therapy: Secondary | ICD-10-CM | POA: Diagnosis not present

## 2023-05-08 DIAGNOSIS — J449 Chronic obstructive pulmonary disease, unspecified: Secondary | ICD-10-CM | POA: Diagnosis not present

## 2023-05-08 DIAGNOSIS — Z0000 Encounter for general adult medical examination without abnormal findings: Secondary | ICD-10-CM | POA: Diagnosis not present

## 2023-05-08 DIAGNOSIS — E538 Deficiency of other specified B group vitamins: Secondary | ICD-10-CM | POA: Diagnosis not present

## 2023-05-11 ENCOUNTER — Other Ambulatory Visit: Payer: Self-pay | Admitting: Family Medicine

## 2023-05-11 DIAGNOSIS — M810 Age-related osteoporosis without current pathological fracture: Secondary | ICD-10-CM

## 2023-05-20 DIAGNOSIS — J449 Chronic obstructive pulmonary disease, unspecified: Secondary | ICD-10-CM | POA: Diagnosis not present

## 2023-05-20 DIAGNOSIS — R051 Acute cough: Secondary | ICD-10-CM | POA: Diagnosis not present

## 2023-05-20 DIAGNOSIS — Z03818 Encounter for observation for suspected exposure to other biological agents ruled out: Secondary | ICD-10-CM | POA: Diagnosis not present

## 2023-06-12 DIAGNOSIS — M26633 Articular disc disorder of bilateral temporomandibular joint: Secondary | ICD-10-CM | POA: Diagnosis not present

## 2023-06-12 DIAGNOSIS — G4763 Sleep related bruxism: Secondary | ICD-10-CM | POA: Diagnosis not present

## 2023-06-16 DIAGNOSIS — H43813 Vitreous degeneration, bilateral: Secondary | ICD-10-CM | POA: Diagnosis not present

## 2023-06-16 DIAGNOSIS — H524 Presbyopia: Secondary | ICD-10-CM | POA: Diagnosis not present

## 2023-06-16 DIAGNOSIS — Z961 Presence of intraocular lens: Secondary | ICD-10-CM | POA: Diagnosis not present

## 2023-07-08 DIAGNOSIS — R350 Frequency of micturition: Secondary | ICD-10-CM | POA: Diagnosis not present

## 2023-07-08 DIAGNOSIS — Z1151 Encounter for screening for human papillomavirus (HPV): Secondary | ICD-10-CM | POA: Diagnosis not present

## 2023-07-08 DIAGNOSIS — Z124 Encounter for screening for malignant neoplasm of cervix: Secondary | ICD-10-CM | POA: Diagnosis not present

## 2023-07-08 DIAGNOSIS — Z6823 Body mass index (BMI) 23.0-23.9, adult: Secondary | ICD-10-CM | POA: Diagnosis not present

## 2023-07-13 DIAGNOSIS — K649 Unspecified hemorrhoids: Secondary | ICD-10-CM | POA: Diagnosis not present

## 2023-07-13 DIAGNOSIS — K921 Melena: Secondary | ICD-10-CM | POA: Diagnosis not present

## 2023-07-13 DIAGNOSIS — Z860101 Personal history of adenomatous and serrated colon polyps: Secondary | ICD-10-CM | POA: Diagnosis not present

## 2023-08-10 ENCOUNTER — Ambulatory Visit: Payer: PPO | Attending: Obstetrics and Gynecology

## 2023-08-10 ENCOUNTER — Other Ambulatory Visit: Payer: Self-pay

## 2023-08-10 DIAGNOSIS — M6281 Muscle weakness (generalized): Secondary | ICD-10-CM | POA: Diagnosis present

## 2023-08-10 DIAGNOSIS — R293 Abnormal posture: Secondary | ICD-10-CM | POA: Insufficient documentation

## 2023-08-10 DIAGNOSIS — R102 Pelvic and perineal pain: Secondary | ICD-10-CM | POA: Diagnosis not present

## 2023-08-10 DIAGNOSIS — N8110 Cystocele, unspecified: Secondary | ICD-10-CM | POA: Insufficient documentation

## 2023-08-10 DIAGNOSIS — M62838 Other muscle spasm: Secondary | ICD-10-CM

## 2023-08-10 NOTE — Therapy (Signed)
 OUTPATIENT PHYSICAL THERAPY FEMALE PELVIC EVALUATION   Patient Name: Catherine [name] MRN: [id number] DOB:1946-01-31, 78 y.o., female Today's Date: 08/10/2023  END OF SESSION:  PT End of Session - 08/10/23 1230     Visit Number 1    Date for PT Re-Evaluation 10/05/23    Authorization Type HTA    PT Start Time 1230    PT Stop Time 1313    PT Time Calculation (min) 43 min    Activity Tolerance Patient tolerated treatment well    Behavior During Therapy [facility] for tasks assessed/performed             Past Medical History:  Diagnosis Date   Abdominal pain 06/06/2019   Anxiety    Aortic aneurysm (HCC) 12/08/2018   Arthritis    HANDS   BCC (basal cell carcinoma) 04/07/1995   right jawline (CX3, 5FU)   BCC (basal cell carcinoma) 05/19/2013   right scalp/post (treatment biopsy)   Chronic constipation    Complication of anesthesia    upon waking up from anesthesia patient experienced chest pain - EKG done was negative, patient states it may have been indigestion   COPD (chronic obstructive pulmonary disease) (HCC)    DDD (degenerative disc disease), lumbar    L2-3/3-4/4-5   Dizziness 01/17/2019   DOE (dyspnea on exertion) 01/17/2019   Dysplastic nevus 12/04/2009   mod-severe left forearm, exc   Dysuria    Frequency of urination    Ganglion cyst of right groin 11/23/2018   GERD (gastroesophageal reflux disease)    Hearing aid worn    Infected hernioplasty mesh (HCC) 01/26/2019   Keratosis, actinic 02/18/1991   left abdomen   Migraine    Mild emphysema (HCC)    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 HUMPHRIES/GRAPEY   Wears glasses    Past Surgical History:  Procedure Laterality Date    ABDOMINAL HYSTERECTOMY  1982   W/  UNILATERAL SALPINGOOPHORECTOMY   bilateral hernia surgery      removed ganglion cyst and swollen lymph node    BREAST BIOPSY Right    Years ago- benign   CHOLECYSTECTOMY OPEN  1987   W/  APPENDECTOMY   COLONOSCOPY  2002  &  12-2012 & 03/19/15   CYSTO WITH HYDRODISTENSION N/A 02/20/2014   Procedure: CYSTOSCOPY/HYDRODISTENSION WITH CHLOROPACTIN AND MARCAINE  INSTILLATION;  Surgeon: Alm GORMAN Fragmin, MD;  Location: [facility];  Service: Urology;  Laterality: N/A;   EXCISION MASS LOWER EXTREMETIES Right 11/02/2018   Procedure: REMOVAL OF RIGHT GROIN AND THIGH SUBCUTANEOUS MASSES.;  Surgeon: Sheldon Standing, MD;  Location: [facility];  Service: General;  Laterality: Right;   EYE SURGERY     bilateral cataract surgery    GROIN DISSECTION N/A 02/28/2019   Procedure: GROIN EXPLORATION AND DEBRIDEMENT;  Surgeon: Sheldon Standing, MD;  Location: [facility];  Service: General;  Laterality: N/A;   HERNIA REPAIR     INCISION AND DRAINAGE ABSCESS N/A 12/09/2018   Procedure: INCISION AND DRAINAGE RIGHT GROIN and thigh  WITH DEBRIDEMENT;  Surgeon: Sheldon Standing, MD;  Location: [facility];  Service: General;  Laterality: N/A;   IR CATHETER TUBE CHANGE  05/05/2019   IR RADIOLOGIST EVAL & MGMT  05/03/2019   IR RADIOLOGIST EVAL & MGMT  05/10/2019   KNEE ARTHROSCOPY W/ MENISCAL REPAIR Right 2021   LAPAROSCOPIC ABDOMINAL EXPLORATION Right 02/28/2019   Procedure: LAPAROSCOPIC EXPLORATION WITH REMOVAL OF RIGHT PREPERITONEAL MESH;  Surgeon: Sheldon Standing, MD;  Location: [facility];  Service: General;  Laterality: Right;   LAPAROSCOPIC LYSIS OF ADHESIONS Bilateral 02/28/2019   Procedure: LAPAROSCOPIC LYSIS OF ADHESIONS;  Surgeon: Sheldon Standing, MD;  Location: [facility];  Service: General;  Laterality: Bilateral;   LAPAROSCOPIC UNILATERAL SALPINGOOPHORECTOMY  1984   LAPAROSCOPY N/A 01/24/2021   Procedure: LAPAROSCOPY DIAGNOSTIC;  Surgeon: Sheldon Standing, MD;  Location: [facility];  Service:  General;  Laterality: N/A;   LESION REMOVAL Right 02/28/2019   Procedure: EXCISION VAGINAL ABSCESS  AND DISSECTION;  Surgeon: Sheldon Standing, MD;  Location: [facility];  Service: General;  Laterality: Right;   MASS EXCISION Right 06/16/2018   Procedure: REMOVAL OF RIGHT GROIN SUBCUTANEOUS MASS ERAS PATHWAY;  Surgeon: Sheldon Standing, MD;  Location: [facility];  Service: General;  Laterality: Right;   removal of lymph node cyst in right groin      ROTATOR CUFF REPAIR Right 2003   and BONE SPUR   VENTRAL HERNIA REPAIR N/A 01/24/2021   Procedure: LYSIS OF ADHESIONS, RIGHT INGUINAL AND INCISIONAL HERNIA REPAIR WITH MESH.  TAP BLOCK;  Surgeon: Sheldon Standing, MD;  Location: [facility];  Service: General;  Laterality: N/A;   Patient Active Problem List   Diagnosis Date Noted   Ear pain, right 03/19/2023   Chronic rhinitis 04/14/2022   COVID-19 long hauler 01/16/2022   COVID-19 virus infection 11/20/2021   CAP (community acquired pneumonia) 11/20/2021   Chronic obstructive pulmonary disease (HCC) 10/16/2021   Abdominal pain 06/06/2019   Seroma, post-traumatic (HCC) 04/27/2019   Medication monitoring encounter 03/31/2019   Abscess of groin, right 02/28/2019   Infected hernioplasty mesh (HCC) 01/26/2019   Dizziness 01/17/2019   DOE (dyspnea on exertion) 01/17/2019   Mycobacterium fortuitum infection    Thigh abscess    Aortic aneurysm (HCC) 12/08/2018   Soft tissue infection - Mycobaterium fortuitum   12/08/2018   Cellulitis of right groin 12/08/2018   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   Groin abscess 06/16/2018   Anxiety    Urgency of urination    DDD (degenerative disc disease), lumbar    Osteopenia 08/04/2017   Right sided sciatica 02/18/2017   Piriformis muscle pain 02/18/2017   Right groin pain 11/04/2016   Neuropathy 11/04/2016   Numbness 11/04/2016   Depression with anxiety 11/04/2016   Dysfunction of right eustachian tube 10/30/2015   Presbycusis of both ears 10/30/2015   Sensory  hearing loss, bilateral 10/30/2015   Subjective tinnitus of both ears 10/30/2015   Atypical chest pain 05/08/2014    PCP: Verena Mems, MD  REFERRING PROVIDER: Johnnye Ade, MD   REFERRING DIAG: N81.10 (ICD-10-CM) - Cystocele, unspecified  THERAPY DIAG:  Muscle weakness (generalized)  Other muscle spasm  Abnormal posture  Pelvic pain  Rationale for Evaluation and Treatment: Rehabilitation  ONSET DATE: 2 years ago  SUBJECTIVE:  SUBJECTIVE STATEMENT: Pt states that she had vaginal dryness start a couple of years ago. About a year ago, she noticed that she has to urinate all the time. She has internal burning and pressure. She has been on estradiol cream for a year and a half and it helps some. She has appointment with urogyn coming up. Just started Linzess. She does about 30 minutes of stretching exercises each morning.  Fluid intake: Yes: a lot of water     PAIN:  Are you having pain? Yes NPRS scale: 3-4/10 Pain location: Vaginal and abdomen pain  Pain type: burning Pain description: constant   Aggravating factors: after urination Relieving factors: nothing helps   PRECAUTIONS: Other: fall risk, osteopenia   RED FLAGS: None   WEIGHT BEARING RESTRICTIONS: No  FALLS:  Has patient fallen in last 6 months? No  LIVING ENVIRONMENT: Lives with: lives with their family Lives in: House/apartment  OCCUPATION: retired Chiropractor  PLOF: Independent  PATIENT GOALS: decrease pain, strengthen core   PERTINENT HISTORY:  Osteopenia, G1P1, hysterectomy on her 30s, slow transit constipation, hemorrhoids, 7 surgeries in Rt groin (inguinal and femoral hernia repairs, various cysts, infection), ventral hernia  BOWEL MOVEMENT: Pain with bowel movement: No Type of bowel  movement:Frequency daily and Strain Yes Fully empty rectum: No Leakage: No Pads: No Fiber supplement: No  URINATION: Pain with urination: Yes Fully empty bladder: No Stream:  start and stop Urgency: Yes: doesn't leak Frequency: at least every hour; 1x at 5 nocturia  Leakage: Coughing, Sneezing, and Laughing Pads: No  INTERCOURSE: Not sexually active   PREGNANCY: Vaginal deliveries 1  Tearing No C-section deliveries 0  Currently pregnant No  PROLAPSE: Vaginal pressure    OBJECTIVE:  Note: Objective measures were completed at Evaluation unless otherwise noted. 08/10/23:  COGNITION: Overall cognitive status: Within functional limits for tasks assessed     SENSATION: Light touch: Appears intact Proprioception: Appears intact  FUNCTIONAL TESTS:  Curl-up test: abdominal distortion, worse in Rt lower quadrant  GAIT: Comments: WNL  POSTURE: rounded shoulders, forward head, and increased thoracic kyphosis   PALPATION:   General  Tenderness over bladder with urgency, tenderness in Rt lower quadrant                External Perineal Exam dryness, phimosis, labial fusion                             Internal Pelvic Floor adhesions and stricture, low tone, discomfort with palpation  Patient confirms identification and approves PT to assess internal pelvic floor and treatment Yes  PELVIC MMT:   MMT eval  Vaginal 2/5, 2 second endurance, 7 repeat contractions, decreased coordination and co-contraction of glutes   Diastasis Recti Notable abdominal bulge at midline and in Rt lower quadrant  (Blank rows = not tested)        TONE: low  PROLAPSE: Grade 1 anterior vaginal wall laxity in supine mid-day  TODAY'S TREATMENT:  DATE:  08/10/23  EVAL  Neuromuscular re-education: Pt provides verbal consent for internal vaginal/rectal pelvic floor  exam. Internal vaginal pelvic floor muscle contraction training: Quick flicks Long holds Therapeutic activities: Squatty potty Inverted lying Vulvovaginal massage Double voiding  Urge drill    PATIENT EDUCATION:  Education details: See above Person educated: Patient Education method: Explanation, Demonstration, Tactile cues, Verbal cues, and Handouts Education comprehension: verbalized understanding  HOME EXERCISE PROGRAM: [id number]  ASSESSMENT:  CLINICAL IMPRESSION: Patient is a 78 y.o. female who was seen today for physical therapy evaluation and treatment for pelvic/vaginal pain, vaginal pressure, and bladder dysfunction. Exam findings notable for abnormal posture, core weakness, abdominal tenderness in Rt lower quadrant and over bladder, pelvic floor muscle weakness, decreased pelvic floor muscle endurance, decreased pelvic floor muscle coordination, mild anterior vaginal wall laxity, vulvar atrophy/fusion/dryness, vaginal stricture, and vulvar/vaginal tenderness. Signs and symptoms are most consistent with poor core pressure management, vaginal atrophy, and pelvic floor muscle weakness. Initial treatment consisted of education on squatty potty, inverted lying, vulvovaginal massage, double voiding, and urge drill; pelvic floor muscle contraction training also performed with internal vaginal feedback for improved coordination and strength. She will continue to benefit from skilled PT intervention in order to decrease vaginal/pelvic pain and pressure, improve bladder function, increase appropriate core pressure management, and begin/progress functional strengthening program.   OBJECTIVE IMPAIRMENTS: decreased activity tolerance, decreased coordination, decreased endurance, decreased strength, increased fascial restrictions, increased muscle spasms, impaired tone, postural dysfunction, and pain.   ACTIVITY LIMITATIONS: continence  PARTICIPATION LIMITATIONS: community  activity  PERSONAL FACTORS: 1 comorbidity: medical history  are also affecting patient's functional outcome.   REHAB POTENTIAL: Good  CLINICAL DECISION MAKING: Stable/uncomplicated  EVALUATION COMPLEXITY: Low   GOALS: Goals reviewed with patient? Yes  SHORT TERM GOALS: Target date: 09/07/23  Pt will be independent with HEP.   Baseline: Goal status: INITIAL  2.  Pt will be independent with the knack, urge suppression technique, and double voiding in order to improve bladder habits and decrease urinary incontinence.   Baseline:  Goal status: INITIAL  3.  Pt will be independent with use of squatty potty, relaxed toileting mechanics, and improved bowel movement techniques in order to increase ease of bowel movements and complete evacuation.   Baseline:  Goal status: INITIAL  4.  Pt will report improvement in pelvic/vaginal pain to no greater than 2/10.  Baseline:  Goal status: INITIAL  5.  Pt will be independent with vulvovaginal massage and perform on daily basis to help improve pain Baseline:  Goal status: INITIAL  6.  Pt will increase pelvic floor muscle strength to 3/5.  Baseline:  Goal status: INITIAL  LONG TERM GOALS: Target date: 10/05/23  Pt will be independent with advanced HEP.   Baseline:  Goal status: INITIAL  2.  Pt will increase pelvic floor muscle strength to 4/5.  Baseline:  Goal status: INITIAL  3.  Pt will increase pelvic floor muscle endurance to more than 10 seconds.  Baseline:  Goal status: INITIAL  4.  Pt will report 0/10 vaginal/pelvic pain.  Baseline:  Goal status: INITIAL  5.  Pt will be able to go 2-3 hours in between voids without urgency or incontinence in order to improve QOL and perform all functional activities with less difficulty.   Baseline:  Goal status: INITIAL  6.  Pt will be able to perform curl-up test without any abdominal distortion.  Baseline:  Goal status: INITIAL  PLAN:  PT FREQUENCY: 1-2x/week  PT  DURATION: 8  weeks  PLANNED INTERVENTIONS: 97110-Therapeutic exercises, 97530- Therapeutic activity, 97112- Neuromuscular re-education, 97535- Self Care, [id number]- Manual therapy, Dry Needling, and Biofeedback  PLAN FOR NEXT SESSION: teach bowel mobilization, the knack; core training   Josette Mares, PT, DPT01/06/251:49 PM

## 2023-08-10 NOTE — Patient Instructions (Addendum)
 Squatty potty: When your knees are level or below the level of your hips, pelvic floor muscles are pressed against rectum, preventing ease of bowel movement. By getting knees above the level of the hips, these pelvic floor muscles relax, allowing easier passage of bowel movement.  Ways to get knees above hips: o Squatty Potty (7inch and 9inch versions) o Small stool o Roll of toilet paper under each foot o Hardback book or stack of magazines under each foot  Relaxed Toileting mechanics: Once in this position, make sure to lean forward with forearms on thighs, wide knees, relaxed stomach, and breathe.    Double-voiding:  This technique is to help with post-void dribbling, or leaking a little bit when you stand up right after urinating. Use relaxed toileting mechanics to urinate as much as you feel like you have to without straining. Sit back upright from leaning forward and relax this way for 10-20 seconds. Lean forward again to finish voiding any amount more.    Vulvar/vaginal Massage: This is a technique to help decrease painful sensitivity in the vaginal area. It can also help to restore normal moisture levels in the vaginal tissues. With coconut oil, aloe, jojoba oil, or a specific vaginal moisturizer, gently massage into vaginal tissues. Think of this as part of your post-shower routine and moisturizing just like you would the rest of the body with lotion. This helps to increase good blood flow to the vaginal tissues. In addition, it also teaches the body that touch to the vagina does not have to be painful or threatening, but moisturizing and gentle.      The first picture shows that there is no effect on the pelvic floor with gravity eliminated. The next three show that with a wedge pillow or a few pillows from home under your pelvis the pelvic floor is inverted and may relax and allows gravity to help return prolapsed areas more inward to help relieve symptoms. Do  this 15-20 mins every evening when symptoms tend to be worse. Stop if you have pain or negative symptoms.    Urge Incontinence  Ideal urination frequency is every 2-4 wakeful hours, which equates to 5-8 times within a 24-hour period.   Urge incontinence is leakage that occurs when the bladder muscle contracts, creating a sudden need to go before getting to the bathroom.   Going too often when your bladder isn't actually full can disrupt the body's automatic signals to store and hold urine longer, which will increase urgency/frequency.  In this case, the bladder "is running the show" and strategies can be learned to retrain this pattern.   One should be able to control the first urge to urinate, at around .  The bladder can hold up to a "grande latte," or . To help you gain control, practice the Urge Drill below when urgency strikes.  This drill will help retrain your bladder signals and allow you to store and hold urine longer.  The overall goal is to stretch out your time between voids to reach a more manageable voiding schedule.    Practice your quick flicks often throughout the day (each waking hour) even when you don't need feel the urge to go.  This will help strengthen your pelvic floor muscles, making them more effective in controlling leakage.  Urge Drill  When you feel an urge to go, follow these steps to regain control: Stop what you are doing and be still Take one deep breath, directing your air into your abdomen  Think an affirming thought, such as "I've got this." Do 5 quick flicks of your pelvic floor Walk with control to the bathroom to void, or delay voiding    [facility] [address] [place], [place] [address] Phone # [phone] Fax [phone]

## 2023-08-17 ENCOUNTER — Ambulatory Visit: Payer: PPO

## 2023-08-17 DIAGNOSIS — M6281 Muscle weakness (generalized): Secondary | ICD-10-CM

## 2023-08-17 DIAGNOSIS — R102 Pelvic and perineal pain: Secondary | ICD-10-CM

## 2023-08-17 DIAGNOSIS — M62838 Other muscle spasm: Secondary | ICD-10-CM

## 2023-08-17 DIAGNOSIS — R293 Abnormal posture: Secondary | ICD-10-CM

## 2023-08-17 DIAGNOSIS — N8110 Cystocele, unspecified: Secondary | ICD-10-CM | POA: Diagnosis not present

## 2023-08-17 NOTE — Therapy (Signed)
 OUTPATIENT PHYSICAL THERAPY FEMALE PELVIC TREATMENT   Patient Name: Catherine [name] MRN: [id number] DOB:03-06-1946, 78 y.o., female Today's Date: 08/17/2023  END OF SESSION:  PT End of Session - 08/17/23 1228     Visit Number 2    Date for PT Re-Evaluation 10/05/23    Authorization Type HTA    PT Start Time 1230    PT Stop Time 1310    PT Time Calculation (min) 40 min    Activity Tolerance Patient tolerated treatment well    Behavior During Therapy [facility] for tasks assessed/performed              Past Medical History:  Diagnosis Date   Abdominal pain 06/06/2019   Anxiety    Aortic aneurysm (HCC) 12/08/2018   Arthritis    HANDS   BCC (basal cell carcinoma) 04/07/1995   right jawline (CX3, 5FU)   BCC (basal cell carcinoma) 05/19/2013   right scalp/post (treatment biopsy)   Chronic constipation    Complication of anesthesia    upon waking up from anesthesia patient experienced chest pain - EKG done was negative, patient states it may have been indigestion   COPD (chronic obstructive pulmonary disease) (HCC)    DDD (degenerative disc disease), lumbar    L2-3/3-4/4-5   Dizziness 01/17/2019   DOE (dyspnea on exertion) 01/17/2019   Dysplastic nevus 12/04/2009   mod-severe left forearm, exc   Dysuria    Frequency of urination    Ganglion cyst of right groin 11/23/2018   GERD (gastroesophageal reflux disease)    Hearing aid worn    Infected hernioplasty mesh (HCC) 01/26/2019   Keratosis, actinic 02/18/1991   left abdomen   Migraine    Mild emphysema (HCC)    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 HUMPHRIES/GRAPEY   Wears glasses    Past Surgical History:  Procedure Laterality Date    ABDOMINAL HYSTERECTOMY  1982   W/  UNILATERAL SALPINGOOPHORECTOMY   bilateral hernia surgery      removed ganglion cyst and swollen lymph node    BREAST BIOPSY Right    Years ago- benign   CHOLECYSTECTOMY OPEN  1987   W/  APPENDECTOMY   COLONOSCOPY  2002  &  12-2012 & 03/19/15   CYSTO WITH HYDRODISTENSION N/A 02/20/2014   Procedure: CYSTOSCOPY/HYDRODISTENSION WITH CHLOROPACTIN AND MARCAINE  INSTILLATION;  Surgeon: Alm GORMAN Fragmin, MD;  Location: [facility];  Service: Urology;  Laterality: N/A;   EXCISION MASS LOWER EXTREMETIES Right 11/02/2018   Procedure: REMOVAL OF RIGHT GROIN AND THIGH SUBCUTANEOUS MASSES.;  Surgeon: Sheldon Standing, MD;  Location: [facility];  Service: General;  Laterality: Right;   EYE SURGERY     bilateral cataract surgery    GROIN DISSECTION N/A 02/28/2019   Procedure: GROIN EXPLORATION AND DEBRIDEMENT;  Surgeon: Sheldon Standing, MD;  Location: [facility];  Service: General;  Laterality: N/A;   HERNIA REPAIR     INCISION AND DRAINAGE ABSCESS N/A 12/09/2018   Procedure: INCISION AND DRAINAGE RIGHT GROIN and thigh  WITH DEBRIDEMENT;  Surgeon: Sheldon Standing, MD;  Location: [facility];  Service: General;  Laterality: N/A;   IR CATHETER TUBE CHANGE  05/05/2019   IR RADIOLOGIST EVAL & MGMT  05/03/2019   IR RADIOLOGIST EVAL & MGMT  05/10/2019   KNEE ARTHROSCOPY W/ MENISCAL REPAIR Right 2021   LAPAROSCOPIC ABDOMINAL EXPLORATION Right 02/28/2019   Procedure: LAPAROSCOPIC EXPLORATION WITH REMOVAL OF RIGHT PREPERITONEAL MESH;  Surgeon: Sheldon Standing, MD;  Location: [facility];  Service: General;  Laterality: Right;   LAPAROSCOPIC LYSIS OF ADHESIONS Bilateral 02/28/2019   Procedure: LAPAROSCOPIC LYSIS OF ADHESIONS;  Surgeon: Sheldon Standing, MD;  Location: [facility];  Service: General;  Laterality: Bilateral;   LAPAROSCOPIC UNILATERAL SALPINGOOPHORECTOMY  1984   LAPAROSCOPY N/A 01/24/2021   Procedure: LAPAROSCOPY DIAGNOSTIC;  Surgeon: Sheldon Standing, MD;  Location: [facility];  Service:  General;  Laterality: N/A;   LESION REMOVAL Right 02/28/2019   Procedure: EXCISION VAGINAL ABSCESS  AND DISSECTION;  Surgeon: Sheldon Standing, MD;  Location: [facility];  Service: General;  Laterality: Right;   MASS EXCISION Right 06/16/2018   Procedure: REMOVAL OF RIGHT GROIN SUBCUTANEOUS MASS ERAS PATHWAY;  Surgeon: Sheldon Standing, MD;  Location: [facility];  Service: General;  Laterality: Right;   removal of lymph node cyst in right groin      ROTATOR CUFF REPAIR Right 2003   and BONE SPUR   VENTRAL HERNIA REPAIR N/A 01/24/2021   Procedure: LYSIS OF ADHESIONS, RIGHT INGUINAL AND INCISIONAL HERNIA REPAIR WITH MESH.  TAP BLOCK;  Surgeon: Sheldon Standing, MD;  Location: [facility];  Service: General;  Laterality: N/A;   Patient Active Problem List   Diagnosis Date Noted   Ear pain, right 03/19/2023   Chronic rhinitis 04/14/2022   COVID-19 long hauler 01/16/2022   COVID-19 virus infection 11/20/2021   CAP (community acquired pneumonia) 11/20/2021   Chronic obstructive pulmonary disease (HCC) 10/16/2021   Abdominal pain 06/06/2019   Seroma, post-traumatic (HCC) 04/27/2019   Medication monitoring encounter 03/31/2019   Abscess of groin, right 02/28/2019   Infected hernioplasty mesh (HCC) 01/26/2019   Dizziness 01/17/2019   DOE (dyspnea on exertion) 01/17/2019   Mycobacterium fortuitum infection    Thigh abscess    Aortic aneurysm (HCC) 12/08/2018   Soft tissue infection - Mycobaterium fortuitum   12/08/2018   Cellulitis of right groin 12/08/2018   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   Groin abscess 06/16/2018   Anxiety    Urgency of urination    DDD (degenerative disc disease), lumbar    Osteopenia 08/04/2017   Right sided sciatica 02/18/2017   Piriformis muscle pain 02/18/2017   Right groin pain 11/04/2016   Neuropathy 11/04/2016   Numbness 11/04/2016   Depression with anxiety 11/04/2016   Dysfunction of right eustachian tube 10/30/2015   Presbycusis of both ears 10/30/2015   Sensory  hearing loss, bilateral 10/30/2015   Subjective tinnitus of both ears 10/30/2015   Atypical chest pain 05/08/2014    PCP: Verena Mems, MD  REFERRING PROVIDER: Johnnye Ade, MD   REFERRING DIAG: N81.10 (ICD-10-CM) - Cystocele, unspecified  THERAPY DIAG:  Muscle weakness (generalized)  Other muscle spasm  Abnormal posture  Pelvic pain  Rationale for Evaluation and Treatment: Rehabilitation  ONSET DATE: 2 years ago  SUBJECTIVE:  SUBJECTIVE STATEMENT: Pt states that she feels like she is making some progress with bladder control. She states that there is no change with the burning sensation. She did do mona lisa last year and reports that there were not any lasting changes. She feels like the estrogen cream was helpful at first, but now just increases her burning.   PAIN:  Are you having pain? Yes NPRS scale: 3-4/10 Pain location: Vaginal and abdomen pain  Pain type: burning Pain description: constant   Aggravating factors: after urination Relieving factors: nothing helps   PRECAUTIONS: Other: fall risk, osteopenia   RED FLAGS: None   WEIGHT BEARING RESTRICTIONS: No  FALLS:  Has patient fallen in last 6 months? No  LIVING ENVIRONMENT: Lives with: lives with their family Lives in: House/apartment  OCCUPATION: retired Chiropractor  PLOF: Independent  PATIENT GOALS: decrease pain, strengthen core   PERTINENT HISTORY:  Osteopenia, G1P1, hysterectomy on her 30s, slow transit constipation, hemorrhoids, 7 surgeries in Rt groin (inguinal and femoral hernia repairs, various cysts, infection), ventral hernia  BOWEL MOVEMENT: Pain with bowel movement: No Type of bowel movement:Frequency daily and Strain Yes Fully empty rectum: No Leakage: No Pads: No Fiber  supplement: No  URINATION: Pain with urination: Yes Fully empty bladder: No Stream:  start and stop Urgency: Yes: doesn't leak Frequency: at least every hour; 1x at 5 nocturia  Leakage: Coughing, Sneezing, and Laughing Pads: No  INTERCOURSE: Not sexually active   PREGNANCY: Vaginal deliveries 1  Tearing No C-section deliveries 0  Currently pregnant No  PROLAPSE: Vaginal pressure    OBJECTIVE:  Note: Objective measures were completed at Evaluation unless otherwise noted. 08/10/23:  COGNITION: Overall cognitive status: Within functional limits for tasks assessed     SENSATION: Light touch: Appears intact Proprioception: Appears intact  FUNCTIONAL TESTS:  Curl-up test: abdominal distortion, worse in Rt lower quadrant  GAIT: Comments: WNL  POSTURE: rounded shoulders, forward head, and increased thoracic kyphosis   PALPATION:   General  Tenderness over bladder with urgency, tenderness in Rt lower quadrant                External Perineal Exam dryness, phimosis, labial fusion                             Internal Pelvic Floor adhesions and stricture, low tone, discomfort with palpation  Patient confirms identification and approves PT to assess internal pelvic floor and treatment Yes  PELVIC MMT:   MMT eval  Vaginal 2/5, 2 second endurance, 7 repeat contractions, decreased coordination and co-contraction of glutes   Diastasis Recti Notable abdominal bulge at midline and in Rt lower quadrant  (Blank rows = not tested)        TONE: low  PROLAPSE: Grade 1 anterior vaginal wall laxity in supine mid-day  TODAY'S TREATMENT:  DATE:  08/17/23 Manual: Supine bowel mobilization Neuromuscular re-education: Transversus abdominus training with multimodal cues for improved motor control and breath coordination Transversus abdominus isometrics 2 x  10 with pelvic floor muscle contraction Supine bil UE ball press with transversus abdominus and pelvic floor muscle 2 x 10 Supine hip adduction with transversus abdominus and pelvic floor muscle 2 x 10 Bridge with hip adduction, transversus abdominus, and pelvic floor muscle 2 x 10 Seated hip adduction ball press with transversus abdominus and pelvic floor muscle 2 x 10 Seated hip abduction red band with transversus abdominus and pelvic floor muscle 2 x 10 Seated resisted march red band with transversus abdominus and pelvic floor muscle 2 x 10 Therapeutic activities: Self-bowel massage   08/10/23  EVAL  Neuromuscular re-education: Pt provides verbal consent for internal vaginal/rectal pelvic floor exam. Internal vaginal pelvic floor muscle contraction training: Quick flicks Long holds Therapeutic activities: Squatty potty Inverted lying Vulvovaginal massage Double voiding  Urge drill    PATIENT EDUCATION:  Education details: See above Person educated: Patient Education method: Programmer, Multimedia, Demonstration, Tactile cues, Verbal cues, and Handouts Education comprehension: verbalized understanding  HOME EXERCISE PROGRAM: [id number]  ASSESSMENT:  CLINICAL IMPRESSION: Pt tolerated bowel mobilization very well and we discussed how to perform self-bowel massage at home to assist with ease of bowel movements. This will also hopefully help with discomfort that she feels in Rt lower quadrant. She started core training today and was able to achieve a contraction in Rt lower side, but much weaker compared to Lt. She had trouble with proprioception of this, but was able to improve. It was challenging for her to perform deep core facilitation without breath holding, but she improved to being able to perform correctly about 50% of the time. She did well incorporating deep core contraction in to other exercises and Hep updated. She will continue to benefit from skilled PT intervention in order to  decrease vaginal/pelvic pain and pressure, improve bladder function, increase appropriate core pressure management, and begin/progress functional strengthening program.   OBJECTIVE IMPAIRMENTS: decreased activity tolerance, decreased coordination, decreased endurance, decreased strength, increased fascial restrictions, increased muscle spasms, impaired tone, postural dysfunction, and pain.   ACTIVITY LIMITATIONS: continence  PARTICIPATION LIMITATIONS: community activity  PERSONAL FACTORS: 1 comorbidity: medical history  are also affecting patient's functional outcome.   REHAB POTENTIAL: Good  CLINICAL DECISION MAKING: Stable/uncomplicated  EVALUATION COMPLEXITY: Low   GOALS: Goals reviewed with patient? Yes  SHORT TERM GOALS: Target date: 09/07/23  Pt will be independent with HEP.   Baseline: Goal status: INITIAL  2.  Pt will be independent with the knack, urge suppression technique, and double voiding in order to improve bladder habits and decrease urinary incontinence.   Baseline:  Goal status: INITIAL  3.  Pt will be independent with use of squatty potty, relaxed toileting mechanics, and improved bowel movement techniques in order to increase ease of bowel movements and complete evacuation.   Baseline:  Goal status: INITIAL  4.  Pt will report improvement in pelvic/vaginal pain to no greater than 2/10.  Baseline:  Goal status: INITIAL  5.  Pt will be independent with vulvovaginal massage and perform on daily basis to help improve pain Baseline:  Goal status: INITIAL  6.  Pt will increase pelvic floor muscle strength to 3/5.  Baseline:  Goal status: INITIAL  LONG TERM GOALS: Target date: 10/05/23  Pt will be independent with advanced HEP.   Baseline:  Goal status: INITIAL  2.  Pt will increase pelvic  floor muscle strength to 4/5.  Baseline:  Goal status: INITIAL  3.  Pt will increase pelvic floor muscle endurance to more than 10 seconds.  Baseline:  Goal  status: INITIAL  4.  Pt will report 0/10 vaginal/pelvic pain.  Baseline:  Goal status: INITIAL  5.  Pt will be able to go 2-3 hours in between voids without urgency or incontinence in order to improve QOL and perform all functional activities with less difficulty.   Baseline:  Goal status: INITIAL  6.  Pt will be able to perform curl-up test without any abdominal distortion.  Baseline:  Goal status: INITIAL  PLAN:  PT FREQUENCY: 1-2x/week  PT DURATION: 8 weeks  PLANNED INTERVENTIONS: 97110-Therapeutic exercises, 97530- Therapeutic activity, 97112- Neuromuscular re-education, 97535- Self Care, [id number]- Manual therapy, Dry Needling, and Biofeedback  PLAN FOR NEXT SESSION: teach bowel mobilization, the knack; core training   Josette Mares, PT, DPT01/13/251:58 PM

## 2023-08-24 ENCOUNTER — Ambulatory Visit: Payer: PPO

## 2023-08-24 DIAGNOSIS — M62838 Other muscle spasm: Secondary | ICD-10-CM

## 2023-08-24 DIAGNOSIS — R293 Abnormal posture: Secondary | ICD-10-CM

## 2023-08-24 DIAGNOSIS — N8110 Cystocele, unspecified: Secondary | ICD-10-CM | POA: Diagnosis not present

## 2023-08-24 DIAGNOSIS — R102 Pelvic and perineal pain: Secondary | ICD-10-CM

## 2023-08-24 DIAGNOSIS — M6281 Muscle weakness (generalized): Secondary | ICD-10-CM

## 2023-08-24 NOTE — Therapy (Signed)
OUTPATIENT PHYSICAL THERAPY FEMALE PELVIC TREATMENT   Patient Name: Catherine [name] MRN: [id number] DOB:03-09-1946, 78 y.o., female Today's Date: 08/24/2023  END OF SESSION:  PT End of Session - 08/24/23 1237     Visit Number 3    Date for PT Re-Evaluation 10/05/23    Authorization Type HTA    PT Start Time 1230    PT Stop Time 1310    PT Time Calculation (min) 40 min    Activity Tolerance Patient tolerated treatment well    Behavior During Therapy [facility] for tasks assessed/performed              Past Medical History:  Diagnosis Date   Abdominal pain 06/06/2019   Anxiety    Aortic aneurysm (HCC) 12/08/2018   Arthritis    HANDS   BCC (basal cell carcinoma) 04/07/1995   right jawline (CX3, 5FU)   BCC (basal cell carcinoma) 05/19/2013   right scalp/post (treatment biopsy)   Chronic constipation    Complication of anesthesia    upon waking up from anesthesia patient experienced chest pain - EKG done was negative, patient states "it may have been indigestion"   COPD (chronic obstructive pulmonary disease) (HCC)    DDD (degenerative disc disease), lumbar    L2-3/3-4/4-5   Dizziness 01/17/2019   DOE (dyspnea on exertion) 01/17/2019   Dysplastic nevus 12/04/2009   mod-severe left forearm, exc   Dysuria    Frequency of urination    Ganglion cyst of right groin 11/23/2018   GERD (gastroesophageal reflux disease)    Hearing aid worn    Infected hernioplasty mesh (HCC) 01/26/2019   Keratosis, actinic 02/18/1991   left abdomen   Migraine    Mild emphysema (HCC)    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 HUMPHRIES/GRAPEY   Wears glasses    Past Surgical History:  Procedure Laterality Date    ABDOMINAL HYSTERECTOMY  1982   W/  UNILATERAL SALPINGOOPHORECTOMY   bilateral hernia surgery      removed ganglion cyst and swollen lymph node    BREAST BIOPSY Right    Years ago- benign   CHOLECYSTECTOMY OPEN  1987   W/  APPENDECTOMY   COLONOSCOPY  2002  &  12-2012 & 03/19/15   CYSTO WITH HYDRODISTENSION N/A 02/20/2014   Procedure: CYSTOSCOPY/HYDRODISTENSION WITH CHLOROPACTIN AND MARCAINE INSTILLATION;  Surgeon: Valetta Fuller, MD;  Location: [facility];  Service: Urology;  Laterality: N/A;   EXCISION MASS LOWER EXTREMETIES Right 11/02/2018   Procedure: REMOVAL OF RIGHT GROIN AND THIGH SUBCUTANEOUS MASSES.;  Surgeon: Karie Soda, MD;  Location: [facility];  Service: General;  Laterality: Right;   EYE SURGERY     bilateral cataract surgery    GROIN DISSECTION N/A 02/28/2019   Procedure: GROIN EXPLORATION AND DEBRIDEMENT;  Surgeon: Karie Soda, MD;  Location: [facility];  Service: General;  Laterality: N/A;   HERNIA REPAIR     INCISION AND DRAINAGE ABSCESS N/A 12/09/2018   Procedure: INCISION AND DRAINAGE RIGHT GROIN and thigh  WITH DEBRIDEMENT;  Surgeon: Karie Soda, MD;  Location: [facility];  Service: General;  Laterality: N/A;   IR CATHETER TUBE CHANGE  05/05/2019   IR RADIOLOGIST EVAL & MGMT  05/03/2019   IR RADIOLOGIST EVAL & MGMT  05/10/2019   KNEE ARTHROSCOPY W/ MENISCAL REPAIR Right 2021   LAPAROSCOPIC ABDOMINAL EXPLORATION Right 02/28/2019   Procedure: LAPAROSCOPIC EXPLORATION WITH REMOVAL OF RIGHT PREPERITONEAL MESH;  Surgeon: Karie Soda, MD;  Location: [facility];  Service: General;  Laterality: Right;   LAPAROSCOPIC LYSIS OF ADHESIONS Bilateral 02/28/2019   Procedure: LAPAROSCOPIC LYSIS OF ADHESIONS;  Surgeon: Karie Soda, MD;  Location: [facility];  Service: General;  Laterality: Bilateral;   LAPAROSCOPIC UNILATERAL SALPINGOOPHORECTOMY  1984   LAPAROSCOPY N/A 01/24/2021   Procedure: LAPAROSCOPY DIAGNOSTIC;  Surgeon: Karie Soda, MD;  Location: [facility];  Service:  General;  Laterality: N/A;   LESION REMOVAL Right 02/28/2019   Procedure: EXCISION VAGINAL ABSCESS  AND DISSECTION;  Surgeon: Karie Soda, MD;  Location: [facility];  Service: General;  Laterality: Right;   MASS EXCISION Right 06/16/2018   Procedure: REMOVAL OF RIGHT GROIN SUBCUTANEOUS MASS ERAS PATHWAY;  Surgeon: Karie Soda, MD;  Location: [facility];  Service: General;  Laterality: Right;   removal of lymph node cyst in right groin      ROTATOR CUFF REPAIR Right 2003   and BONE SPUR   VENTRAL HERNIA REPAIR N/A 01/24/2021   Procedure: LYSIS OF ADHESIONS, RIGHT INGUINAL AND INCISIONAL HERNIA REPAIR WITH MESH.  TAP BLOCK;  Surgeon: Karie Soda, MD;  Location: [facility];  Service: General;  Laterality: N/A;   Patient Active Problem List   Diagnosis Date Noted   Ear pain, right 03/19/2023   Chronic rhinitis 04/14/2022   COVID-19 long hauler 01/16/2022   COVID-19 virus infection 11/20/2021   CAP (community acquired pneumonia) 11/20/2021   Chronic obstructive pulmonary disease (HCC) 10/16/2021   Abdominal pain 06/06/2019   Seroma, post-traumatic (HCC) 04/27/2019   Medication monitoring encounter 03/31/2019   Abscess of groin, right 02/28/2019   Infected hernioplasty mesh (HCC) 01/26/2019   Dizziness 01/17/2019   DOE (dyspnea on exertion) 01/17/2019   Mycobacterium fortuitum infection    Thigh abscess    Aortic aneurysm (HCC) 12/08/2018   Soft tissue infection - Mycobaterium fortuitum   12/08/2018   Cellulitis of right groin 12/08/2018   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   Groin abscess 06/16/2018   Anxiety    Urgency of urination    DDD (degenerative disc disease), lumbar    Osteopenia 08/04/2017   Right sided sciatica 02/18/2017   Piriformis muscle pain 02/18/2017   Right groin pain 11/04/2016   Neuropathy 11/04/2016   Numbness 11/04/2016   Depression with anxiety 11/04/2016   Dysfunction of right eustachian tube 10/30/2015   Presbycusis of both ears 10/30/2015   Sensory  hearing loss, bilateral 10/30/2015   Subjective tinnitus of both ears 10/30/2015   Atypical chest pain 05/08/2014    PCP: Mila Palmer, MD  REFERRING PROVIDER: Richarda Overlie, MD   REFERRING DIAG: N81.10 (ICD-10-CM) - Cystocele, unspecified  THERAPY DIAG:  Abnormal posture  Muscle weakness (generalized)  Other muscle spasm  Pelvic pain  Rationale for Evaluation and Treatment: Rehabilitation  ONSET DATE: 2 years ago  SUBJECTIVE:  SUBJECTIVE STATEMENT: Pt states that she feels like coconut oil is helping. She feels like up until today, she felt like urgency was getting better. However, today she feels like she has to urinate every 2 minutes. She had several trips out when she did not have to urinate which is good progress. Pt states that she is having more Rt lower quadrant soreness when she is performing exercises. She feels like she is having to work harder to have a bowel movement and strain more.   PAIN:  Are you having pain? Yes NPRS scale: 5/10 Pain location: Vaginal and abdomen pain  Pain type: burning Pain description: constant   Aggravating factors: after urination Relieving factors: nothing helps   PRECAUTIONS: Other: fall risk, osteopenia   RED FLAGS: None   WEIGHT BEARING RESTRICTIONS: No  FALLS:  Has patient fallen in last 6 months? No  LIVING ENVIRONMENT: Lives with: lives with their family Lives in: House/apartment  OCCUPATION: retired Chiropractor  PLOF: Independent  PATIENT GOALS: decrease pain, strengthen core   PERTINENT HISTORY:  Osteopenia, G1P1, hysterectomy on her 30s, slow transit constipation, hemorrhoids, 7 surgeries in Rt groin (inguinal and femoral hernia repairs, various cysts, infection), ventral hernia  BOWEL MOVEMENT: Pain with  bowel movement: No Type of bowel movement:Frequency daily and Strain Yes Fully empty rectum: No Leakage: No Pads: No Fiber supplement: No  URINATION: Pain with urination: Yes Fully empty bladder: No Stream:  start and stop Urgency: Yes: doesn't leak Frequency: at least every hour; 1x at 5 nocturia  Leakage: Coughing, Sneezing, and Laughing Pads: No  INTERCOURSE: Not sexually active   PREGNANCY: Vaginal deliveries 1  Tearing No C-section deliveries 0  Currently pregnant No  PROLAPSE: Vaginal pressure    OBJECTIVE:  Note: Objective measures were completed at Evaluation unless otherwise noted. 08/10/23:  COGNITION: Overall cognitive status: Within functional limits for tasks assessed     SENSATION: Light touch: Appears intact Proprioception: Appears intact  FUNCTIONAL TESTS:  Curl-up test: abdominal distortion, worse in Rt lower quadrant  GAIT: Comments: WNL  POSTURE: rounded shoulders, forward head, and increased thoracic kyphosis   PALPATION:   General  Tenderness over bladder with urgency, tenderness in Rt lower quadrant                External Perineal Exam dryness, phimosis, labial fusion                             Internal Pelvic Floor adhesions and stricture, low tone, discomfort with palpation  Patient confirms identification and approves PT to assess internal pelvic floor and treatment Yes  PELVIC MMT:   MMT eval  Vaginal 2/5, 2 second endurance, 7 repeat contractions, decreased coordination and co-contraction of glutes   Diastasis Recti Notable abdominal bulge at midline and in Rt lower quadrant  (Blank rows = not tested)        TONE: low  PROLAPSE: Grade 1 anterior vaginal wall laxity in supine mid-day  TODAY'S TREATMENT:  DATE:  08/24/23 Manual: Abdominal scar tissue mobilization  Soft tissue mobilization to Rt  lower quadrant Exercises: Modified thomas stretch 2 min bil Lower trunk rotation 2 x10 Therapeutic activities: Balloon pushing to help improve ease of bowel movements and make them more effective   08/17/23 Manual: Supine bowel mobilization Neuromuscular re-education: Transversus abdominus training with multimodal cues for improved motor control and breath coordination Transversus abdominus isometrics 2 x 10 with pelvic floor muscle contraction Supine bil UE ball press with transversus abdominus and pelvic floor muscle 2 x 10 Supine hip adduction with transversus abdominus and pelvic floor muscle 2 x 10 Bridge with hip adduction, transversus abdominus, and pelvic floor muscle 2 x 10 Seated hip adduction ball press with transversus abdominus and pelvic floor muscle 2 x 10 Seated hip abduction red band with transversus abdominus and pelvic floor muscle 2 x 10 Seated resisted march red band with transversus abdominus and pelvic floor muscle 2 x 10 Therapeutic activities: Self-bowel massage   08/10/23  EVAL  Neuromuscular re-education: Pt provides verbal consent for internal vaginal/rectal pelvic floor exam. Internal vaginal pelvic floor muscle contraction training: Quick flicks Long holds Therapeutic activities: Squatty potty Inverted lying Vulvovaginal massage Double voiding  Urge drill    PATIENT EDUCATION:  Education details: See above Person educated: Patient Education method: Explanation, Demonstration, Tactile cues, Verbal cues, and Handouts Education comprehension: verbalized understanding  HOME EXERCISE PROGRAM: [id number]  ASSESSMENT:  CLINICAL IMPRESSION: Pt having much more pain this week and today more urgency. Believe this may be due to abdominal strengthening being too intense at this time; modified HEP to take away resistance to marching. Manual techniques performed to abdominal scar tissue to pt tolerance; she had very many tender points that she reported  sharp new pain that she normally does not feel. She did report at end of session having improved discomfort. Good tolerance to new stretches and HEP updated. We discussed improved push mechanics for better bowel movement evacuation. She will continue to benefit from skilled PT intervention in order to decrease vaginal/pelvic pain and pressure, improve bladder function, increase appropriate core pressure management, and begin/progress functional strengthening program.   OBJECTIVE IMPAIRMENTS: decreased activity tolerance, decreased coordination, decreased endurance, decreased strength, increased fascial restrictions, increased muscle spasms, impaired tone, postural dysfunction, and pain.   ACTIVITY LIMITATIONS: continence  PARTICIPATION LIMITATIONS: community activity  PERSONAL FACTORS: 1 comorbidity: medical history  are also affecting patient's functional outcome.   REHAB POTENTIAL: Good  CLINICAL DECISION MAKING: Stable/uncomplicated  EVALUATION COMPLEXITY: Low   GOALS: Goals reviewed with patient? Yes  SHORT TERM GOALS: Target date: 09/07/23  Pt will be independent with HEP.   Baseline: Goal status: INITIAL  2.  Pt will be independent with the knack, urge suppression technique, and double voiding in order to improve bladder habits and decrease urinary incontinence.   Baseline:  Goal status: INITIAL  3.  Pt will be independent with use of squatty potty, relaxed toileting mechanics, and improved bowel movement techniques in order to increase ease of bowel movements and complete evacuation.   Baseline:  Goal status: INITIAL  4.  Pt will report improvement in pelvic/vaginal pain to no greater than 2/10.  Baseline:  Goal status: INITIAL  5.  Pt will be independent with vulvovaginal massage and perform on daily basis to help improve pain Baseline:  Goal status: INITIAL  6.  Pt will increase pelvic floor muscle strength to 3/5.  Baseline:  Goal status: INITIAL  LONG TERM  GOALS: Target date: 10/05/23  Pt will be independent with advanced HEP.   Baseline:  Goal status: INITIAL  2.  Pt will increase pelvic floor muscle strength to 4/5.  Baseline:  Goal status: INITIAL  3.  Pt will increase pelvic floor muscle endurance to more than 10 seconds.  Baseline:  Goal status: INITIAL  4.  Pt will report 0/10 vaginal/pelvic pain.  Baseline:  Goal status: INITIAL  5.  Pt will be able to go 2-3 hours in between voids without urgency or incontinence in order to improve QOL and perform all functional activities with less difficulty.   Baseline:  Goal status: INITIAL  6.  Pt will be able to perform curl-up test without any abdominal distortion.  Baseline:  Goal status: INITIAL  PLAN:  PT FREQUENCY: 1-2x/week  PT DURATION: 8 weeks  PLANNED INTERVENTIONS: 97110-Therapeutic exercises, 97530- Therapeutic activity, 97112- Neuromuscular re-education, 97535- Self Care, [address]- Manual therapy, Dry Needling, and Biofeedback  PLAN FOR NEXT SESSION: teach bowel mobilization, the knack; core training   Julio Alm, PT, DPT01/20/2512:38 PM

## 2023-08-31 ENCOUNTER — Ambulatory Visit: Payer: PPO

## 2023-08-31 DIAGNOSIS — N8110 Cystocele, unspecified: Secondary | ICD-10-CM | POA: Diagnosis not present

## 2023-08-31 DIAGNOSIS — M62838 Other muscle spasm: Secondary | ICD-10-CM

## 2023-08-31 DIAGNOSIS — M6281 Muscle weakness (generalized): Secondary | ICD-10-CM

## 2023-08-31 DIAGNOSIS — R102 Pelvic and perineal pain: Secondary | ICD-10-CM

## 2023-08-31 DIAGNOSIS — R293 Abnormal posture: Secondary | ICD-10-CM

## 2023-08-31 NOTE — Patient Instructions (Signed)
Trigger Point Dry Needling  What is Trigger Point Dry Needling (DN)? DN is a physical therapy technique used to treat muscle pain and dysfunction. Specifically, DN helps deactivate muscle trigger points (muscle knots).  A thin filiform needle is used to penetrate the skin and stimulate the underlying trigger point. The goal is for a local twitch response (LTR) to occur and for the trigger point to relax. No medication of any kind is injected during the procedure.   What Does Trigger Point Dry Needling Feel Like?  The procedure feels different for each individual patient. Some patients report that they do not actually feel the needle enter the skin and overall the process is not painful. Very mild bleeding may occur. However, many patients feel a deep cramping in the muscle in which the needle was inserted. This is the local twitch response.   How Will I feel after the treatment? Soreness is normal, and the onset of soreness may not occur for a few hours. Typically this soreness does not last longer than two days.  Bruising is uncommon, however; ice can be used to decrease any possible bruising.  In rare cases feeling tired or nauseous after the treatment is normal. In addition, your symptoms may get worse before they get better, this period will typically not last longer than 24 hours.   What Can I do After My Treatment? Increase your hydration by drinking more water for the next 24 hours.  You may place ice or heat on the areas treated that have become sore, however, do not use heat on inflamed or bruised areas. Heat often brings more relief post needling. You can continue your regular activities, but vigorous activity is not recommended initially after the treatment for 24 hours. DN is best combined with other physical therapy such as strengthening, stretching, and other therapies.   What are the complications? While your therapist has had extensive training in minimizing the risks of trigger  point dry needling, it is important to understand the risks of any procedure.  Risks include bleeding, pain, fatigue, hematoma, infection, vertigo, nausea or nerve involvement. Monitor for any changes to your skin or sensation. Contact your therapist or MD with concerns.  A rare but serious complication is a pneumothorax over or near your middle and upper chest and back If you have dry needling in this area, monitor for the following symptoms: Shortness of breath on exertion and/or Difficulty taking a deep breath and/or Chest Pain and/or A dry cough If any of the above symptoms develop, please go to the nearest emergency room or call 911. Tell them you had dry needling over your thorax and report any symptoms you are having. Please follow-up with your treating therapist after you complete the medical evaluation.   [facility]  [address] [place] [address].  [phone]

## 2023-08-31 NOTE — Therapy (Signed)
OUTPATIENT PHYSICAL THERAPY FEMALE PELVIC TREATMENT   Patient Name: Catherine [name] MRN: [id number] DOB:08-Jul-1946, 78 y.o., female Today's Date: 08/31/2023  END OF SESSION:  PT End of Session - 08/31/23 1228     Visit Number 4    Date for PT Re-Evaluation 10/05/23    Authorization Type HTA    PT Start Time 1230    PT Stop Time 1310    PT Time Calculation (min) 40 min    Activity Tolerance Patient tolerated treatment well    Behavior During Therapy [facility] for tasks assessed/performed              Past Medical History:  Diagnosis Date   Abdominal pain 06/06/2019   Anxiety    Aortic aneurysm (HCC) 12/08/2018   Arthritis    HANDS   BCC (basal cell carcinoma) 04/07/1995   right jawline (CX3, 5FU)   BCC (basal cell carcinoma) 05/19/2013   right scalp/post (treatment biopsy)   Chronic constipation    Complication of anesthesia    upon waking up from anesthesia patient experienced chest pain - EKG done was negative, patient states "it may have been indigestion"   COPD (chronic obstructive pulmonary disease) (HCC)    DDD (degenerative disc disease), lumbar    L2-3/3-4/4-5   Dizziness 01/17/2019   DOE (dyspnea on exertion) 01/17/2019   Dysplastic nevus 12/04/2009   mod-severe left forearm, exc   Dysuria    Frequency of urination    Ganglion cyst of right groin 11/23/2018   GERD (gastroesophageal reflux disease)    Hearing aid worn    Infected hernioplasty mesh (HCC) 01/26/2019   Keratosis, actinic 02/18/1991   left abdomen   Migraine    Mild emphysema (HCC)    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 HUMPHRIES/GRAPEY   Wears glasses    Past Surgical History:  Procedure Laterality Date    ABDOMINAL HYSTERECTOMY  1982   W/  UNILATERAL SALPINGOOPHORECTOMY   bilateral hernia surgery      removed ganglion cyst and swollen lymph node    BREAST BIOPSY Right    Years ago- benign   CHOLECYSTECTOMY OPEN  1987   W/  APPENDECTOMY   COLONOSCOPY  2002  &  12-2012 & 03/19/15   CYSTO WITH HYDRODISTENSION N/A 02/20/2014   Procedure: CYSTOSCOPY/HYDRODISTENSION WITH CHLOROPACTIN AND MARCAINE INSTILLATION;  Surgeon: Valetta Fuller, MD;  Location: [facility];  Service: Urology;  Laterality: N/A;   EXCISION MASS LOWER EXTREMETIES Right 11/02/2018   Procedure: REMOVAL OF RIGHT GROIN AND THIGH SUBCUTANEOUS MASSES.;  Surgeon: Karie Soda, MD;  Location: [facility];  Service: General;  Laterality: Right;   EYE SURGERY     bilateral cataract surgery    GROIN DISSECTION N/A 02/28/2019   Procedure: GROIN EXPLORATION AND DEBRIDEMENT;  Surgeon: Karie Soda, MD;  Location: [facility];  Service: General;  Laterality: N/A;   HERNIA REPAIR     INCISION AND DRAINAGE ABSCESS N/A 12/09/2018   Procedure: INCISION AND DRAINAGE RIGHT GROIN and thigh  WITH DEBRIDEMENT;  Surgeon: Karie Soda, MD;  Location: [facility];  Service: General;  Laterality: N/A;   IR CATHETER TUBE CHANGE  05/05/2019   IR RADIOLOGIST EVAL & MGMT  05/03/2019   IR RADIOLOGIST EVAL & MGMT  05/10/2019   KNEE ARTHROSCOPY W/ MENISCAL REPAIR Right 2021   LAPAROSCOPIC ABDOMINAL EXPLORATION Right 02/28/2019   Procedure: LAPAROSCOPIC EXPLORATION WITH REMOVAL OF RIGHT PREPERITONEAL MESH;  Surgeon: Karie Soda, MD;  Location: [facility];  Service: General;  Laterality: Right;   LAPAROSCOPIC LYSIS OF ADHESIONS Bilateral 02/28/2019   Procedure: LAPAROSCOPIC LYSIS OF ADHESIONS;  Surgeon: Karie Soda, MD;  Location: [facility];  Service: General;  Laterality: Bilateral;   LAPAROSCOPIC UNILATERAL SALPINGOOPHORECTOMY  1984   LAPAROSCOPY N/A 01/24/2021   Procedure: LAPAROSCOPY DIAGNOSTIC;  Surgeon: Karie Soda, MD;  Location: [facility];  Service:  General;  Laterality: N/A;   LESION REMOVAL Right 02/28/2019   Procedure: EXCISION VAGINAL ABSCESS  AND DISSECTION;  Surgeon: Karie Soda, MD;  Location: [facility];  Service: General;  Laterality: Right;   MASS EXCISION Right 06/16/2018   Procedure: REMOVAL OF RIGHT GROIN SUBCUTANEOUS MASS ERAS PATHWAY;  Surgeon: Karie Soda, MD;  Location: [facility];  Service: General;  Laterality: Right;   removal of lymph node cyst in right groin      ROTATOR CUFF REPAIR Right 2003   and BONE SPUR   VENTRAL HERNIA REPAIR N/A 01/24/2021   Procedure: LYSIS OF ADHESIONS, RIGHT INGUINAL AND INCISIONAL HERNIA REPAIR WITH MESH.  TAP BLOCK;  Surgeon: Karie Soda, MD;  Location: [facility];  Service: General;  Laterality: N/A;   Patient Active Problem List   Diagnosis Date Noted   Ear pain, right 03/19/2023   Chronic rhinitis 04/14/2022   COVID-19 long hauler 01/16/2022   COVID-19 virus infection 11/20/2021   CAP (community acquired pneumonia) 11/20/2021   Chronic obstructive pulmonary disease (HCC) 10/16/2021   Abdominal pain 06/06/2019   Seroma, post-traumatic (HCC) 04/27/2019   Medication monitoring encounter 03/31/2019   Abscess of groin, right 02/28/2019   Infected hernioplasty mesh (HCC) 01/26/2019   Dizziness 01/17/2019   DOE (dyspnea on exertion) 01/17/2019   Mycobacterium fortuitum infection    Thigh abscess    Aortic aneurysm (HCC) 12/08/2018   Soft tissue infection - Mycobaterium fortuitum   12/08/2018   Cellulitis of right groin 12/08/2018   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   Groin abscess 06/16/2018   Anxiety    Urgency of urination    DDD (degenerative disc disease), lumbar    Osteopenia 08/04/2017   Right sided sciatica 02/18/2017   Piriformis muscle pain 02/18/2017   Right groin pain 11/04/2016   Neuropathy 11/04/2016   Numbness 11/04/2016   Depression with anxiety 11/04/2016   Dysfunction of right eustachian tube 10/30/2015   Presbycusis of both ears 10/30/2015   Sensory  hearing loss, bilateral 10/30/2015   Subjective tinnitus of both ears 10/30/2015   Atypical chest pain 05/08/2014    PCP: Mila Palmer, MD  REFERRING PROVIDER: Richarda Overlie, MD   REFERRING DIAG: N81.10 (ICD-10-CM) - Cystocele, unspecified  THERAPY DIAG:  Abnormal posture  Muscle weakness (generalized)  Other muscle spasm  Pelvic pain  Rationale for Evaluation and Treatment: Rehabilitation  ONSET DATE: 2 years ago  SUBJECTIVE:  SUBJECTIVE STATEMENT: Pt states that she is feeling much better overall. She thinks coconut oil is very helpful and she is using regularly. She feels like she is still urinating more than she thinks she should, but is noticing improvements. She is having to take it easy on her exercises because her Rt lower abdomen tends to bother her. She would like to avoid exercises that include her knee due to aggravation of Rt knee pain.   PAIN:  Are you having pain? Yes NPRS scale: 0/10 Pain location: Vaginal and abdomen pain  Pain type: burning Pain description: constant   Aggravating factors: after urination Relieving factors: nothing helps   PRECAUTIONS: Other: fall risk, osteopenia   RED FLAGS: None   WEIGHT BEARING RESTRICTIONS: No  FALLS:  Has patient fallen in last 6 months? No  LIVING ENVIRONMENT: Lives with: lives with their family Lives in: House/apartment  OCCUPATION: retired Chiropractor  PLOF: Independent  PATIENT GOALS: decrease pain, strengthen core   PERTINENT HISTORY:  Osteopenia, G1P1, hysterectomy on her 30s, slow transit constipation, hemorrhoids, 7 surgeries in Rt groin (inguinal and femoral hernia repairs, various cysts, infection), ventral hernia  BOWEL MOVEMENT: Pain with bowel movement: No Type of bowel  movement:Frequency daily and Strain Yes Fully empty rectum: No Leakage: No Pads: No Fiber supplement: No  URINATION: Pain with urination: Yes Fully empty bladder: No Stream:  start and stop Urgency: Yes: doesn't leak Frequency: at least every hour; 1x at 5 nocturia  Leakage: Coughing, Sneezing, and Laughing Pads: No  INTERCOURSE: Not sexually active   PREGNANCY: Vaginal deliveries 1  Tearing No C-section deliveries 0  Currently pregnant No  PROLAPSE: Vaginal pressure    OBJECTIVE:  Note: Objective measures were completed at Evaluation unless otherwise noted. 08/10/23:  COGNITION: Overall cognitive status: Within functional limits for tasks assessed     SENSATION: Light touch: Appears intact Proprioception: Appears intact  FUNCTIONAL TESTS:  Curl-up test: abdominal distortion, worse in Rt lower quadrant  GAIT: Comments: WNL  POSTURE: rounded shoulders, forward head, and increased thoracic kyphosis   PALPATION:   General  Tenderness over bladder with urgency, tenderness in Rt lower quadrant                External Perineal Exam dryness, phimosis, labial fusion                             Internal Pelvic Floor adhesions and stricture, low tone, discomfort with palpation  Patient confirms identification and approves PT to assess internal pelvic floor and treatment Yes  PELVIC MMT:   MMT eval  Vaginal 2/5, 2 second endurance, 7 repeat contractions, decreased coordination and co-contraction of glutes   Diastasis Recti Notable abdominal bulge at midline and in Rt lower quadrant  (Blank rows = not tested)        TONE: low  PROLAPSE: Grade 1 anterior vaginal wall laxity in supine mid-day  TODAY'S TREATMENT:  DATE:  08/31/23 Manual: Trigger Point Dry Needling  Initial Treatment: Pt instructed on Dry Needling rational, procedures,  and possible side effects. Pt instructed to expect mild to moderate muscle soreness later in the day and/or into the next day.  Pt instructed in methods to reduce muscle soreness. Pt instructed to continue prescribed HEP. Patient was educated on signs and symptoms of infection and other risk factors and advised to seek medical attention should they occur.  Patient verbalized understanding of these instructions and education.   Patient Verbal Consent Given: Yes Education Handout Provided: Yes Muscles Treated: Rt glutes and Rt QL/lumbar paraspinals Electrical Stimulation Performed: No Treatment Response/Outcome: twitch response/release Abdominal scar tissue mobilization  Soft tissue mobilization to Rt lower quadrant Soft tissue mobilization to lumbar paraspinals and Rt glutes Exercises: Single knee to chest 10x bil Lower trunk rotation 2 x 10 Seated clam without resistance 2 x 10 Unilateral supine butterfly 60 seconds bil with support under Rt knee   08/24/23 Manual: Abdominal scar tissue mobilization  Soft tissue mobilization to Rt lower quadrant Exercises: Modified thomas stretch 2 min bil Lower trunk rotation 2 x10 Therapeutic activities: Balloon pushing to help improve ease of bowel movements and make them more effective   08/17/23 Manual: Supine bowel mobilization Neuromuscular re-education: Transversus abdominus training with multimodal cues for improved motor control and breath coordination Transversus abdominus isometrics 2 x 10 with pelvic floor muscle contraction Supine bil UE ball press with transversus abdominus and pelvic floor muscle 2 x 10 Supine hip adduction with transversus abdominus and pelvic floor muscle 2 x 10 Bridge with hip adduction, transversus abdominus, and pelvic floor muscle 2 x 10 Seated hip adduction ball press with transversus abdominus and pelvic floor muscle 2 x 10 Seated hip abduction red band with transversus abdominus and pelvic floor  muscle 2 x 10 Seated resisted march red band with transversus abdominus and pelvic floor muscle 2 x 10 Therapeutic activities: Self-bowel massage   PATIENT EDUCATION:  Education details: See above Person educated: Patient Education method: Explanation, Demonstration, Actor cues, Verbal cues, and Handouts Education comprehension: verbalized understanding  HOME EXERCISE PROGRAM: [id number]  ASSESSMENT:  CLINICAL IMPRESSION: Pt doing better this week. However, believe that Rt lower back pain and Rt hip pain is all related to the abdominal pain and pelvic floor muscle dysfunction that she is having. Due to this, dry needling performed to Rt low back and hip trigger points with notable twitch response and relief. Pt reported feeling much better afterwards. She does feel like her abdominal discomfort is improving. Coconut oil has been very helpful with vaginal discomfort. We discussed the importance of not performed exercises that are aggravating to pain and finding modifications for things, like pillows under knee in butterfly stretch. She will continue to benefit from skilled PT intervention in order to decrease vaginal/pelvic pain and pressure, improve bladder function, increase appropriate core pressure management, and begin/progress functional strengthening program.   OBJECTIVE IMPAIRMENTS: decreased activity tolerance, decreased coordination, decreased endurance, decreased strength, increased fascial restrictions, increased muscle spasms, impaired tone, postural dysfunction, and pain.   ACTIVITY LIMITATIONS: continence  PARTICIPATION LIMITATIONS: community activity  PERSONAL FACTORS: 1 comorbidity: medical history  are also affecting patient's functional outcome.   REHAB POTENTIAL: Good  CLINICAL DECISION MAKING: Stable/uncomplicated  EVALUATION COMPLEXITY: Low   GOALS: Goals reviewed with patient? Yes  SHORT TERM GOALS: Target date: 09/07/23  Pt will be independent with HEP.    Baseline: Goal status: INITIAL  2.  Pt will be independent  with the knack, urge suppression technique, and double voiding in order to improve bladder habits and decrease urinary incontinence.   Baseline:  Goal status: INITIAL  3.  Pt will be independent with use of squatty potty, relaxed toileting mechanics, and improved bowel movement techniques in order to increase ease of bowel movements and complete evacuation.   Baseline:  Goal status: INITIAL  4.  Pt will report improvement in pelvic/vaginal pain to no greater than 2/10.  Baseline:  Goal status: INITIAL  5.  Pt will be independent with vulvovaginal massage and perform on daily basis to help improve pain Baseline:  Goal status: INITIAL  6.  Pt will increase pelvic floor muscle strength to 3/5.  Baseline:  Goal status: INITIAL  LONG TERM GOALS: Target date: 10/05/23  Pt will be independent with advanced HEP.   Baseline:  Goal status: INITIAL  2.  Pt will increase pelvic floor muscle strength to 4/5.  Baseline:  Goal status: INITIAL  3.  Pt will increase pelvic floor muscle endurance to more than 10 seconds.  Baseline:  Goal status: INITIAL  4.  Pt will report 0/10 vaginal/pelvic pain.  Baseline:  Goal status: INITIAL  5.  Pt will be able to go 2-3 hours in between voids without urgency or incontinence in order to improve QOL and perform all functional activities with less difficulty.   Baseline:  Goal status: INITIAL  6.  Pt will be able to perform curl-up test without any abdominal distortion.  Baseline:  Goal status: INITIAL  PLAN:  PT FREQUENCY: 1-2x/week  PT DURATION: 8 weeks  PLANNED INTERVENTIONS: 97110-Therapeutic exercises, 97530- Therapeutic activity, 97112- Neuromuscular re-education, 97535- Self Care, [id number]- Manual therapy, Dry Needling, and Biofeedback  PLAN FOR NEXT SESSION: teach bowel mobilization, the knack; core training   Julio Alm, PT, DPT01/27/251:51 PM

## 2023-09-22 ENCOUNTER — Ambulatory Visit: Payer: PPO | Admitting: Obstetrics

## 2023-09-22 ENCOUNTER — Ambulatory Visit: Payer: PPO | Admitting: Obstetrics and Gynecology

## 2023-09-22 ENCOUNTER — Encounter: Payer: Self-pay | Admitting: Obstetrics and Gynecology

## 2023-09-22 VITALS — BP 133/79 | HR 75 | Ht 69.0 in | Wt 159.0 lb

## 2023-09-22 DIAGNOSIS — N393 Stress incontinence (female) (male): Secondary | ICD-10-CM | POA: Insufficient documentation

## 2023-09-22 DIAGNOSIS — N3281 Overactive bladder: Secondary | ICD-10-CM

## 2023-09-22 DIAGNOSIS — N952 Postmenopausal atrophic vaginitis: Secondary | ICD-10-CM | POA: Diagnosis not present

## 2023-09-22 DIAGNOSIS — R350 Frequency of micturition: Secondary | ICD-10-CM | POA: Diagnosis not present

## 2023-09-22 LAB — POCT URINALYSIS DIPSTICK
Bilirubin, UA: NEGATIVE
Blood, UA: NEGATIVE
Glucose, UA: NEGATIVE
Ketones, UA: NEGATIVE
Leukocytes, UA: NEGATIVE
Nitrite, UA: NEGATIVE
Protein, UA: NEGATIVE
Spec Grav, UA: <=1.005 — AB (ref 1.010–1.025)
Urobilinogen, UA: 0.2 U/dL
pH, UA: 5.5 (ref 5.0–8.0)

## 2023-09-22 MED ORDER — TROSPIUM CHLORIDE 20 MG PO TABS
20.0000 mg | ORAL_TABLET | Freq: Every day | ORAL | 5 refills | Status: DC
Start: 2023-09-22 — End: 2023-12-23

## 2023-09-22 NOTE — Assessment & Plan Note (Signed)
-   Not as bothersome as the urinary urgency.  - Will start with pelvic PT and readdress if not improved

## 2023-09-22 NOTE — Assessment & Plan Note (Signed)
-   Likely the cause of vaginal burning since symptoms have improved with estrace and coconut oil. Continue with current regimen.

## 2023-09-22 NOTE — Assessment & Plan Note (Signed)
-   Minimal prolapse noted on exam today. We discussed her symptoms are consistent with overactive bladder.  - We discussed the symptoms of overactive bladder (OAB), which include urinary urgency, urinary frequency, nocturia, with or without urge incontinence.  While we do not know the exact etiology of OAB, several treatment options exist. We discussed management including behavioral therapy (decreasing bladder irritants, urge suppression strategies, timed voids, bladder retraining), physical therapy, medication; for refractory cases posterior tibial nerve stimulation, sacral neuromodulation, and intravesical botulinum toxin injection.  - Will treat with medication. Trospium is covered by insurance. For anticholinergic medications, we discussed the potential side effects of anticholinergics including dry eyes, dry mouth, constipation, cognitive impairment and urinary retention. She is concerned about the possible side effect of constipation so will start with a lower dose of Trospium 20mg  daily.

## 2023-09-22 NOTE — Progress Notes (Signed)
New Patient Evaluation and Consultation  Referring Provider: Richarda Overlie, MD PCP: Mila Palmer, MD Date of Service: 09/22/2023  SUBJECTIVE Chief Complaint: New Patient (Initial Visit) Catherine [name] is a 78 y.o. female here for a cystocele.)  History of Present Illness: Catherine [name] is a 78 y.o. White or Caucasian female seen in consultation at the request of Dr Marcelle Overlie for evaluation of prolapse.     Urinary Symptoms: Leaks urine with cough/ sneeze Leaks a few times a week.  Has strong urgency but does not leak with urgency.  Does not use pads The leakage is bothersome.  Had symptoms of IC/ bladder pain many years ago but has resolved.   Day time voids- every hour.  Nocturia: 1 times per night to void. Voiding dysfunction:  does not empty bladder well.  Patient does not use a catheter to empty bladder.  When urinating, patient feels a weak stream and dribbling after finishing Drinks: 1 cup decaf coffee in AM, 3-4 12oz glasses water per day  UTIs:  0  UTI's in the last year.   Denies history of blood in urine and kidney or bladder stones   Pelvic Organ Prolapse Symptoms:                  Patient Denies a feeling of a bulge the vaginal area.  Feels a deep aching pain inside She feels that pelvic PT has been helping   Bowel Symptom: Bowel movements: 2 time(s) per day Stool consistency: hard Straining: yes.  Splinting: no.  Incomplete evacuation: yes.  Patient Denies accidental bowel leakage / fecal incontinence Bowel regimen: diet, fiber, stool softener, miralax, and linzess, magnesium citrate and colace in AM, senokot at night Last colonoscopy: Date 2024, Results- 17 tubular adenoma polyps   Sexual Function Sexually active: no.    Pelvic Pain Admits to pelvic pain Location: burning deep inside Pain occurs: in the evenings Prior pain treatment: estradiol twice a week, coconut oil Improved by: above treatment Worsened by: not using creams Tried Catherine [name] touch about a year ago and the dryness came back.   Had several surgeries for hernia repair and infected mesh. Has had abdominal pain since then. She takes gabapentin.    Past Medical History:  Past Medical History:  Diagnosis Date   Abdominal pain 06/06/2019   Anxiety    Aortic aneurysm (HCC) 12/08/2018   Arthritis    HANDS   BCC (basal cell carcinoma) 04/07/1995   right jawline (CX3, 5FU)   BCC (basal cell carcinoma) 05/19/2013   right scalp/post (treatment biopsy)   Chronic constipation    Complication of anesthesia    upon waking up from anesthesia patient experienced chest pain - EKG done was negative, patient states "it may have been indigestion"   COPD (chronic obstructive pulmonary disease) (HCC)    DDD (degenerative disc disease), lumbar    L2-3/3-4/4-5   Dizziness 01/17/2019   DOE (dyspnea on exertion) 01/17/2019   Dysplastic nevus 12/04/2009   mod-severe left forearm, exc   Dysuria    Frequency of urination    Ganglion cyst of right groin 11/23/2018   GERD (gastroesophageal reflux disease)    Hearing aid worn    Infected hernioplasty mesh (HCC) 01/26/2019   Keratosis, actinic 02/18/1991   left abdomen   Migraine    Mild emphysema (HCC)    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   Osteopenia 2019   TO OSTEOPOROSIS    Pelvic pain in female    Seroma, post-traumatic (  HCC) 04/27/2019   Smokers' cough (HCC)    Thoracic aortic aneurysm (TAA) (HCC)    FOLLOWED BY DR Laneta Simmers  LAST CT CHEST  WAS 6-3 SEE EPIC    Urgency of urination    Wears glasses      Past Surgical History:   Past Surgical History:  Procedure Laterality Date   ABDOMINAL HYSTERECTOMY  1982   W/  UNILATERAL SALPINGOOPHORECTOMY   bilateral hernia surgery      removed ganglion cyst and swollen lymph node    BREAST BIOPSY Right    Years ago- benign   CHOLECYSTECTOMY OPEN  1987   W/  APPENDECTOMY   COLONOSCOPY  2002  &  12-2012 & 03/19/15   CYSTO WITH HYDRODISTENSION N/A 02/20/2014    Procedure: CYSTOSCOPY/HYDRODISTENSION WITH CHLOROPACTIN AND MARCAINE INSTILLATION;  Surgeon: Valetta Fuller, MD;  Location: [facility];  Service: Urology;  Laterality: N/A;   EXCISION MASS LOWER EXTREMETIES Right 11/02/2018   Procedure: REMOVAL OF RIGHT GROIN AND THIGH SUBCUTANEOUS MASSES.;  Surgeon: Karie Soda, MD;  Location: [facility];  Service: General;  Laterality: Right;   EYE SURGERY     bilateral cataract surgery    GROIN DISSECTION N/A 02/28/2019   Procedure: GROIN EXPLORATION AND DEBRIDEMENT;  Surgeon: Karie Soda, MD;  Location: [facility];  Service: General;  Laterality: N/A;   HERNIA REPAIR     INCISION AND DRAINAGE ABSCESS N/A 12/09/2018   Procedure: INCISION AND DRAINAGE RIGHT GROIN and thigh  WITH DEBRIDEMENT;  Surgeon: Karie Soda, MD;  Location: [facility];  Service: General;  Laterality: N/A;   IR CATHETER TUBE CHANGE  05/05/2019   IR RADIOLOGIST EVAL & MGMT  05/03/2019   IR RADIOLOGIST EVAL & MGMT  05/10/2019   KNEE ARTHROSCOPY W/ MENISCAL REPAIR Right 2021   LAPAROSCOPIC ABDOMINAL EXPLORATION Right 02/28/2019   Procedure: LAPAROSCOPIC EXPLORATION WITH REMOVAL OF RIGHT PREPERITONEAL MESH;  Surgeon: Karie Soda, MD;  Location: [facility];  Service: General;  Laterality: Right;   LAPAROSCOPIC LYSIS OF ADHESIONS Bilateral 02/28/2019   Procedure: LAPAROSCOPIC LYSIS OF ADHESIONS;  Surgeon: Karie Soda, MD;  Location: [facility];  Service: General;  Laterality: Bilateral;   LAPAROSCOPIC UNILATERAL SALPINGOOPHORECTOMY  1984   LAPAROSCOPY N/A 01/24/2021   Procedure: LAPAROSCOPY DIAGNOSTIC;  Surgeon: Karie Soda, MD;  Location: [facility];  Service: General;  Laterality: N/A;   LESION REMOVAL Right 02/28/2019   Procedure: EXCISION VAGINAL ABSCESS  AND DISSECTION;  Surgeon: Karie Soda, MD;  Location: [facility];  Service: General;  Laterality: Right;   MASS EXCISION Right 06/16/2018   Procedure: REMOVAL OF RIGHT GROIN SUBCUTANEOUS MASS ERAS PATHWAY;  Surgeon: Karie Soda, MD;   Location: [facility];  Service: General;  Laterality: Right;   removal of lymph node cyst in right groin      ROTATOR CUFF REPAIR Right 2003   and BONE SPUR   VENTRAL HERNIA REPAIR N/A 01/24/2021   Procedure: LYSIS OF ADHESIONS, RIGHT INGUINAL AND INCISIONAL HERNIA REPAIR WITH MESH.  TAP BLOCK;  Surgeon: Karie Soda, MD;  Location: [facility];  Service: General;  Laterality: N/A;     Past OB/GYN History: OB History  Gravida Para Term Preterm AB Living  1 1 1   1   SAB IAB Ectopic Multiple Live Births      1    # Outcome Date GA Lbr Len/2nd Weight Sex Type Anes PTL Lv  1 Term      Vag-Spont      S/p hysterectomy  Medications: Patient  has a current medication list which includes the following prescription(s): acetaminophen, azelastine hcl, calcium carbonate antacid, carboxymethylcellulose, cholecalciferol, cyanocobalamin, docusate sodium, esomeprazole, fexofenadine, trelegy ellipta, gabapentin, levalbuterol, linzess, lorazepam, polyethylene glycol, senna, sodium chloride-sodium bicarb, sumatriptan, tramadol, trospium, zoledronic acid, and albuterol.   Allergies: Patient is allergic to ciprofloxacin hcl, metoprolol, penicillins, oxycodone, advair diskus [fluticasone-salmeterol], and fosamax [alendronate].   Social History:  Social History   Tobacco Use   Smoking status: Former    Current packs/day: 0.00    Average packs/day: 0.8 packs/day for 48.0 years (36.0 ttl pk-yrs)    Types: Cigarettes    Start date: 09/04/1969    Quit date: 09/04/2017    Years since quitting: 6.0   Smokeless tobacco: Never  Vaping Use   Vaping status: Never Used  Substance Use Topics   Alcohol use: No   Drug use: No    Relationship status: married Patient lives with her spouse.   Patient is not employed. Regular exercise: Yes: stretching History of abuse: No  Family History:   Family History  Problem Relation Age of Onset   Esophageal cancer Mother 20   Lung cancer Father 79   Breast cancer Maternal  Aunt      Review of Systems: Review of Systems  Constitutional:  Negative for fever, malaise/fatigue and weight loss.  Respiratory:  Negative for cough, shortness of breath and wheezing.   Cardiovascular:  Negative for chest pain, palpitations and leg swelling.  Gastrointestinal:  Positive for abdominal pain. Negative for blood in stool.  Genitourinary:  Negative for dysuria.  Musculoskeletal:  Negative for myalgias.  Skin:  Negative for rash.  Neurological:  Positive for headaches. Negative for dizziness.  Endo/Heme/Allergies:  Does not bruise/bleed easily.  Psychiatric/Behavioral:  Negative for depression. The patient is not nervous/anxious.      OBJECTIVE Physical Exam: Vitals:   09/22/23 1253  BP: 133/79  Pulse: 75  Weight: 159 lb (72.1 kg)  Height: 5\' 9"  (1.753 m)    Physical Exam Vitals reviewed. Exam conducted with a chaperone present.  Constitutional:      General: She is not in acute distress. Pulmonary:     Effort: Pulmonary effort is normal.  Abdominal:     General: There is no distension.     Palpations: Abdomen is soft.     Tenderness: There is no abdominal tenderness. There is no rebound.  Musculoskeletal:        General: No swelling. Normal range of motion.  Skin:    General: Skin is warm and dry.     Findings: No rash.  Neurological:     Mental Status: She is alert and oriented to person, place, and time.  Psychiatric:        Mood and Affect: Mood normal.        Behavior: Behavior normal.      GU / Detailed Urogynecologic Evaluation:  Pelvic Exam: Normal external female genitalia; Bartholin's and Skene's glands normal in appearance; urethral meatus normal in appearance, no urethral masses or discharge.   CST: negative  s/p hysterectomy: Speculum exam reveals normal vaginal mucosa with  atrophy and normal vaginal cuff.  Adnexa no mass, fullness, tenderness.     Pelvic floor strength I/V  Pelvic floor musculature: Right levator tender, Right  obturator tender, Left levator tender, Left obturator tender  POP-Q:   POP-Q  -2.5  Aa   -2.5                                           Ba  -6.5                                              C   2                                            Gh  4.5                                            Pb  6.5                                            tvl   -2                                            Ap  -2                                            Bp                                                 D      Rectal Exam:  Normal external rectum  Post-Void Residual (PVR) by Bladder Scan: In order to evaluate bladder emptying, we discussed obtaining a postvoid residual and patient agreed to this procedure.  Procedure: The ultrasound unit was placed on the patient's abdomen in the suprapubic region after the patient had voided.    Post Void Residual - 09/22/23 1304       Post Void Residual   Post Void Residual 54 mL              Laboratory Results: Lab Results  Component Value Date   COLORU Yellow 09/22/2023   CLARITYU Clear 09/22/2023   GLUCOSEUR Negative 09/22/2023   BILIRUBINUR Negative 09/22/2023   KETONESU Negative 09/22/2023   SPECGRAV <=1.005 (A) 09/22/2023   RBCUR Negative 09/22/2023   PHUR 5.5 09/22/2023   PROTEINUR Negative 09/22/2023   UROBILINOGEN 0.2 09/22/2023   LEUKOCYTESUR Negative 09/22/2023    Lab Results  Component Value Date   CREATININE 0.85 01/13/2023   CREATININE 0.90 12/25/2022   CREATININE 0.87 12/24/2021    No results found for: "HGBA1C"  Lab Results  Component Value Date   HGB 13.6 01/13/2023     ASSESSMENT AND PLAN Ms. Meneely is a 78 y.o. with:  1. Overactive bladder   2. Urinary  frequency   3. SUI (stress urinary incontinence, female)   4. Vaginal atrophy     Overactive bladder Assessment & Plan: - Minimal prolapse noted on exam today. We discussed her symptoms are  consistent with overactive bladder.  - We discussed the symptoms of overactive bladder (OAB), which include urinary urgency, urinary frequency, nocturia, with or without urge incontinence.  While we do not know the exact etiology of OAB, several treatment options exist. We discussed management including behavioral therapy (decreasing bladder irritants, urge suppression strategies, timed voids, bladder retraining), physical therapy, medication; for refractory cases posterior tibial nerve stimulation, sacral neuromodulation, and intravesical botulinum toxin injection.  - Will treat with medication. Trospium is covered by insurance. For anticholinergic medications, we discussed the potential side effects of anticholinergics including dry eyes, dry mouth, constipation, cognitive impairment and urinary retention. She is concerned about the possible side effect of constipation so will start with a lower dose of Trospium 20mg  daily.     Orders: -     Trospium Chloride; Take 1 tablet (20 mg total) by mouth daily.  Dispense: 30 tablet; Refill: 5  Urinary frequency -     POCT urinalysis dipstick  SUI (stress urinary incontinence, female) Assessment & Plan: - Not as bothersome as the urinary urgency.  - Will start with pelvic PT and readdress if not improved   Vaginal atrophy Assessment & Plan: - Likely the cause of vaginal burning since symptoms have improved with estrace and coconut oil. Continue with current regimen.    Return 6 weeks   Marguerita Beards, MD

## 2023-09-22 NOTE — Patient Instructions (Signed)
We discussed the symptoms of overactive bladder (OAB), which include urinary urgency, urinary frequency, night-time urination, with or without urge incontinence.  We discussed management including behavioral therapy (decreasing bladder irritants by following a bladder diet, urge suppression strategies, timed voids, bladder retraining), physical therapy, medication; and for refractory cases posterior tibial nerve stimulation, sacral neuromodulation, and intravesical botulinum toxin injection.   Prescribed Trospium 20mg  a day.

## 2023-10-15 ENCOUNTER — Ambulatory Visit: Payer: PPO | Attending: Obstetrics and Gynecology

## 2023-10-15 DIAGNOSIS — R102 Pelvic and perineal pain: Secondary | ICD-10-CM | POA: Insufficient documentation

## 2023-10-15 DIAGNOSIS — R293 Abnormal posture: Secondary | ICD-10-CM | POA: Insufficient documentation

## 2023-10-15 DIAGNOSIS — M6281 Muscle weakness (generalized): Secondary | ICD-10-CM | POA: Insufficient documentation

## 2023-10-15 DIAGNOSIS — M62838 Other muscle spasm: Secondary | ICD-10-CM | POA: Insufficient documentation

## 2023-10-15 NOTE — Therapy (Signed)
 OUTPATIENT PHYSICAL THERAPY FEMALE PELVIC TREATMENT   Patient Name: Catherine [name] MRN: [id number] DOB:[birth year], 78 y.o., female Today's Date: 10/15/2023  END OF SESSION:  PT End of Session - 10/15/23 1144     Visit Number 5    Date for PT Re-Evaluation 01/07/24    Authorization Type HTA    PT Start Time 1146    PT Stop Time 1226    PT Time Calculation (min) 40 min    Activity Tolerance Patient tolerated treatment well    Behavior During Therapy [facility] for tasks assessed/performed              Past Medical History:  Diagnosis Date   Abdominal pain 06/06/2019   Anxiety    Aortic aneurysm (HCC) 12/08/2018   Arthritis    HANDS   BCC (basal cell carcinoma) 04/07/1995   right jawline (CX3, 5FU)   BCC (basal cell carcinoma) 05/19/2013   right scalp/post (treatment biopsy)   Chronic constipation    Complication of anesthesia    upon waking up from anesthesia patient experienced chest pain - EKG done was negative, patient states "it may have been indigestion"   COPD (chronic obstructive pulmonary disease) (HCC)    DDD (degenerative disc disease), lumbar    L2-3/3-4/4-5   Dizziness 01/17/2019   DOE (dyspnea on exertion) 01/17/2019   Dysplastic nevus 12/04/2009   mod-severe left forearm, exc   Dysuria    Frequency of urination    Ganglion cyst of right groin 11/23/2018   GERD (gastroesophageal reflux disease)    Hearing aid worn    Infected hernioplasty mesh (HCC) 01/26/2019   Keratosis, actinic 02/18/1991   left abdomen   Migraine    Mild emphysema (HCC)    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   Osteopenia 2019   TO OSTEOPOROSIS    Pelvic pain in female    Seroma, post-traumatic (HCC) 04/27/2019   Smokers' cough (HCC)    Thoracic aortic aneurysm (TAA) (HCC)    FOLLOWED BY DR Laneta Simmers  LAST CT CHEST  WAS 6-3 SEE EPIC    Urgency of urination    Wears glasses    Past Surgical History:  Procedure Laterality Date   ABDOMINAL HYSTERECTOMY  1982   W/  UNILATERAL  SALPINGOOPHORECTOMY   bilateral hernia surgery      removed ganglion cyst and swollen lymph node    BREAST BIOPSY Right    Years ago- benign   CHOLECYSTECTOMY OPEN  1987   W/  APPENDECTOMY   COLONOSCOPY  2002  &  12-2012 & 03/19/15   CYSTO WITH HYDRODISTENSION N/A 02/20/2014   Procedure: CYSTOSCOPY/HYDRODISTENSION WITH CHLOROPACTIN AND MARCAINE INSTILLATION;  Surgeon: Valetta Fuller, MD;  Location: [facility];  Service: Urology;  Laterality: N/A;   EXCISION MASS LOWER EXTREMETIES Right 11/02/2018   Procedure: REMOVAL OF RIGHT GROIN AND THIGH SUBCUTANEOUS MASSES.;  Surgeon: Karie Soda, MD;  Location: [facility];  Service: General;  Laterality: Right;   EYE SURGERY     bilateral cataract surgery    GROIN DISSECTION N/A 02/28/2019   Procedure: GROIN EXPLORATION AND DEBRIDEMENT;  Surgeon: Karie Soda, MD;  Location: [facility];  Service: General;  Laterality: N/A;   HERNIA REPAIR     INCISION AND DRAINAGE ABSCESS N/A 12/09/2018   Procedure: INCISION AND DRAINAGE RIGHT GROIN and thigh  WITH DEBRIDEMENT;  Surgeon: Karie Soda, MD;  Location: [facility];  Service: General;  Laterality: N/A;   IR CATHETER TUBE CHANGE  05/05/2019   IR RADIOLOGIST  EVAL & MGMT  05/03/2019   IR RADIOLOGIST EVAL & MGMT  05/10/2019   KNEE ARTHROSCOPY W/ MENISCAL REPAIR Right 2021   LAPAROSCOPIC ABDOMINAL EXPLORATION Right 02/28/2019   Procedure: LAPAROSCOPIC EXPLORATION WITH REMOVAL OF RIGHT PREPERITONEAL MESH;  Surgeon: Karie Soda, MD;  Location: [facility];  Service: General;  Laterality: Right;   LAPAROSCOPIC LYSIS OF ADHESIONS Bilateral 02/28/2019   Procedure: LAPAROSCOPIC LYSIS OF ADHESIONS;  Surgeon: Karie Soda, MD;  Location: [facility];  Service: General;  Laterality: Bilateral;   LAPAROSCOPIC UNILATERAL SALPINGOOPHORECTOMY  1984   LAPAROSCOPY N/A 01/24/2021   Procedure: LAPAROSCOPY DIAGNOSTIC;  Surgeon: Karie Soda, MD;  Location: [facility];  Service: General;  Laterality: N/A;   LESION REMOVAL Right  02/28/2019   Procedure: EXCISION VAGINAL ABSCESS  AND DISSECTION;  Surgeon: Karie Soda, MD;  Location: [facility];  Service: General;  Laterality: Right;   MASS EXCISION Right 06/16/2018   Procedure: REMOVAL OF RIGHT GROIN SUBCUTANEOUS MASS ERAS PATHWAY;  Surgeon: Karie Soda, MD;  Location: [facility];  Service: General;  Laterality: Right;   removal of lymph node cyst in right groin      ROTATOR CUFF REPAIR Right 2003   and BONE SPUR   VENTRAL HERNIA REPAIR N/A 01/24/2021   Procedure: LYSIS OF ADHESIONS, RIGHT INGUINAL AND INCISIONAL HERNIA REPAIR WITH MESH.  TAP BLOCK;  Surgeon: Karie Soda, MD;  Location: [facility];  Service: General;  Laterality: N/A;   Patient Active Problem List   Diagnosis Date Noted   Overactive bladder 09/22/2023   SUI (stress urinary incontinence, female) 09/22/2023   Vaginal atrophy 09/22/2023   Ear pain, right 03/19/2023   Chronic rhinitis 04/14/2022   COVID-19 long hauler 01/16/2022   COVID-19 virus infection 11/20/2021   CAP (community acquired pneumonia) 11/20/2021   Chronic obstructive pulmonary disease (HCC) 10/16/2021   Abdominal pain 06/06/2019   Seroma, post-traumatic (HCC) 04/27/2019   Medication monitoring encounter 03/31/2019   Abscess of groin, right 02/28/2019   Infected hernioplasty mesh (HCC) 01/26/2019   Dizziness 01/17/2019   DOE (dyspnea on exertion) 01/17/2019   Mycobacterium fortuitum infection    Thigh abscess    Aortic aneurysm (HCC) 12/08/2018   Soft tissue infection - Mycobaterium fortuitum   12/08/2018   Cellulitis of right groin 12/08/2018   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   Groin abscess 06/16/2018   Anxiety    Urgency of urination    DDD (degenerative disc disease), lumbar    Osteopenia 08/04/2017   Right sided sciatica 02/18/2017   Piriformis muscle pain 02/18/2017   Right groin pain 11/04/2016   Neuropathy 11/04/2016   Numbness 11/04/2016   Depression with anxiety 11/04/2016   Dysfunction of right eustachian  tube 10/30/2015   Presbycusis of both ears 10/30/2015   Sensory hearing loss, bilateral 10/30/2015   Subjective tinnitus of both ears 10/30/2015   Atypical chest pain 05/08/2014    PCP: Mila Palmer, MD  REFERRING PROVIDER: Richarda Overlie, MD   REFERRING DIAG: N81.10 (ICD-10-CM) - Cystocele, unspecified  THERAPY DIAG:  Abnormal posture - Plan: PT plan of care cert/re-cert  Muscle weakness (generalized) - Plan: PT plan of care cert/re-cert  Other muscle spasm - Plan: PT plan of care cert/re-cert  Pelvic pain - Plan: PT plan of care cert/re-cert  Rationale for Evaluation and Treatment: Rehabilitation  ONSET DATE: 2 years ago  SUBJECTIVE:  SUBJECTIVE STATEMENT: Pt states that she is doing very well overall. She is able to frequently make is 2 hours between trips to the bathroom. After 5 though, she starts going every hour. She met with urogynecologist and was a little disappointed that she was just diagnosed with overactive bladder and was unconcerned about the bladder prolapse. She is not waking up at night until 5 am. She was given medication to help with the urgency and frequency, but she is not wanting to take right now due to side effect of constipation. She reports excellent improvement with dry needling.   PAIN: 10/15/23 Are you having pain? Yes NPRS scale: 3/10 Pain location: Vaginal and abdomen pain  Pain type: burning Pain description: constant   Aggravating factors: after urination Relieving factors: nothing helps   PRECAUTIONS: Other: fall risk, osteopenia   RED FLAGS: None   WEIGHT BEARING RESTRICTIONS: No  FALLS:  Has patient fallen in last 6 months? No  LIVING ENVIRONMENT: Lives with: lives with their family Lives in: House/apartment  OCCUPATION: retired Public house manager  PLOF: Independent  PATIENT GOALS: decrease pain, strengthen core   PERTINENT HISTORY:  Osteopenia, G1P1, hysterectomy on her 30s, slow transit constipation, hemorrhoids, 7 surgeries in Rt groin (inguinal and femoral hernia repairs, various cysts, infection), ventral hernia  BOWEL MOVEMENT: Pain with bowel movement: No Type of bowel movement:Frequency daily and Strain Yes Fully empty rectum: No Leakage: No Pads: No Fiber supplement: No  URINATION: Pain with urination: Yes Fully empty bladder: No Stream:  start and stop Urgency: Yes: doesn't leak Frequency: at least every hour; 1x at 5 nocturia  Leakage: Coughing, Sneezing, and Laughing Pads: No  INTERCOURSE: Not sexually active   PREGNANCY: Vaginal deliveries 1  Tearing No C-section deliveries 0  Currently pregnant No  PROLAPSE: Vaginal pressure    OBJECTIVE:  Note: Objective measures were completed at Evaluation unless otherwise noted. 10/15/23: PFIQ-7: 33 Trigger points present in Rt glutes with tenderness  Improving deep core coordination with breath with better pressure management  08/10/23:  COGNITION: Overall cognitive status: Within functional limits for tasks assessed     SENSATION: Light touch: Appears intact Proprioception: Appears intact  FUNCTIONAL TESTS:  Curl-up test: abdominal distortion, worse in Rt lower quadrant  GAIT: Comments: WNL  POSTURE: rounded shoulders, forward head, and increased thoracic kyphosis   PALPATION:   General  Tenderness over bladder with urgency, tenderness in Rt lower quadrant                External Perineal Exam dryness, phimosis, labial fusion                             Internal Pelvic Floor adhesions and stricture, low tone, discomfort with palpation  Patient confirms identification and approves PT to assess internal pelvic floor and treatment Yes  PELVIC MMT:   MMT eval  Vaginal 2/5, 2 second endurance, 7 repeat contractions,  decreased coordination and co-contraction of glutes   Diastasis Recti Notable abdominal bulge at midline and in Rt lower quadrant  (Blank rows = not tested)        TONE: low  PROLAPSE: Grade 1 anterior vaginal wall laxity in supine mid-day  TODAY'S TREATMENT:  DATE:  10/15/23 Manual: Trigger Point Dry Needling  Initial Treatment: Pt instructed on Dry Needling rational, procedures, and possible side effects. Pt instructed to expect mild to moderate muscle soreness later in the day and/or into the next day.  Pt instructed in methods to reduce muscle soreness. Pt instructed to continue prescribed HEP. Patient was educated on signs and symptoms of infection and other risk factors and advised to seek medical attention should they occur.  Patient verbalized understanding of these instructions and education.   Patient Verbal Consent Given: Yes Education Handout Provided: Yes Muscles Treated: Rt glutes and Rt QL/lumbar paraspinals Electrical Stimulation Performed: No Treatment Response/Outcome: twitch response/release Abdominal scar tissue mobilization  Soft tissue mobilization to Rt lower quadrant Soft tissue mobilization to lumbar paraspinals and Rt glutes Neuromuscular re-education: Standing shoulder extensions red band 2 x 10 Standing shoulder extensions with march 2 x 10 red band  Pallof press 2 x 10 bil red band  Side lying clam shell 2 x 10  08/31/23 Manual: Trigger Point Dry Needling  Initial Treatment: Pt instructed on Dry Needling rational, procedures, and possible side effects. Pt instructed to expect mild to moderate muscle soreness later in the day and/or into the next day.  Pt instructed in methods to reduce muscle soreness. Pt instructed to continue prescribed HEP. Patient was educated on signs and symptoms of infection and other risk factors  and advised to seek medical attention should they occur.  Patient verbalized understanding of these instructions and education.   Patient Verbal Consent Given: Yes Education Handout Provided: Yes Muscles Treated: Rt glutes and Rt QL/lumbar paraspinals Electrical Stimulation Performed: No Treatment Response/Outcome: twitch response/release Abdominal scar tissue mobilization  Soft tissue mobilization to Rt lower quadrant Soft tissue mobilization to lumbar paraspinals and Rt glutes Exercises: Single knee to chest 10x bil Lower trunk rotation 2 x 10 Seated clam without resistance 2 x 10 Unilateral supine butterfly 60 seconds bil with support under Rt knee   08/24/23 Manual: Abdominal scar tissue mobilization  Soft tissue mobilization to Rt lower quadrant Exercises: Modified thomas stretch 2 min bil Lower trunk rotation 2 x10 Therapeutic activities: Balloon pushing to help improve ease of bowel movements and make them more effective   PATIENT EDUCATION:  Education details: See above Person educated: Patient Education method: Programmer, multimedia, Demonstration, Tactile cues, Verbal cues, and Handouts Education comprehension: verbalized understanding  HOME EXERCISE PROGRAM: [id number]  ASSESSMENT:  CLINICAL IMPRESSION: Pt doing very well overall with decreased nocturia to no more than 2x/night, voiding window consistently stretching towards 2 hours, and improving pressure management. She has become consistent with HEP and reports that it is helpful. We have been working on reducing Rt gluteal trigger points as this will help support pelvic floor muscles better and improve pelvic floor muscle contraction. She tolerated all exercise progressions this session very well. HEP not updated at this point in time. She will continue to benefit from skilled PT intervention in order to decrease vaginal/pelvic pain and pressure, improve bladder function, increase appropriate core pressure management, and  begin/progress functional strengthening program.   OBJECTIVE IMPAIRMENTS: decreased activity tolerance, decreased coordination, decreased endurance, decreased strength, increased fascial restrictions, increased muscle spasms, impaired tone, postural dysfunction, and pain.   ACTIVITY LIMITATIONS: continence  PARTICIPATION LIMITATIONS: community activity  PERSONAL FACTORS: 1 comorbidity: medical history  are also affecting patient's functional outcome.   REHAB POTENTIAL: Good  CLINICAL DECISION MAKING: Stable/uncomplicated  EVALUATION COMPLEXITY: Low   GOALS: Goals reviewed with patient? Yes  SHORT TERM GOALS:  Target date: 09/07/23 - updated 10/15/23  Pt will be independent with HEP.   Baseline: Goal status: MET 10/15/23  2.  Pt will be independent with the knack, urge suppression technique, and double voiding in order to improve bladder habits and decrease urinary incontinence.   Baseline:  Goal status: MET 10/15/23  3.  Pt will be independent with use of squatty potty, relaxed toileting mechanics, and improved bowel movement techniques in order to increase ease of bowel movements and complete evacuation.   Baseline:  Goal status: MET 10/15/23  4.  Pt will report improvement in pelvic/vaginal pain to no greater than 2/10.  Baseline:  Goal status: IN PROGRESS 10/15/23  5.  Pt will be independent with vulvovaginal massage and perform on daily basis to help improve pain Baseline:  Goal status: MET 10/15/23  6.  Pt will increase pelvic floor muscle strength to 3/5.  Baseline:  Goal status: IN PROGRESS  LONG TERM GOALS: Target date: 10/05/23 - updated 10/15/23  Pt will be independent with advanced HEP.   Baseline:  Goal status: IN PROGRESS 10/15/23  2.  Pt will increase pelvic floor muscle strength to 4/5.  Baseline:  Goal status: IN PROGRESS 10/15/23  3.  Pt will increase pelvic floor muscle endurance to more than 10 seconds.  Baseline:  Goal status: IN PROGRESS  10/15/23  4.  Pt will report 0/10 vaginal/pelvic pain.  Baseline:  Goal status: IN PROGRESS 10/15/23  5.  Pt will be able to go 2-3 hours in between voids without urgency or incontinence in order to improve QOL and perform all functional activities with less difficulty.   Baseline:  Goal status: IN PROGRESS 10/15/23  6.  Pt will be able to perform curl-up test without any abdominal distortion.  Baseline:  Goal status: IN PROGRESS 10/15/23  PLAN:  PT FREQUENCY: 1-2x/week  PT DURATION: 12 weeks  PLANNED INTERVENTIONS: 97110-Therapeutic exercises, 97530- Therapeutic activity, 97112- Neuromuscular re-education, 97535- Self Care, 65784- Manual therapy, Dry Needling, and Biofeedback  PLAN FOR NEXT SESSION: teach bowel mobilization, the knack; core training   Julio Alm, PT, DPT03/13/2512:24 PM

## 2023-10-16 DIAGNOSIS — R059 Cough, unspecified: Secondary | ICD-10-CM | POA: Diagnosis not present

## 2023-10-20 ENCOUNTER — Ambulatory Visit

## 2023-10-20 ENCOUNTER — Encounter: Payer: Self-pay | Admitting: Internal Medicine

## 2023-10-20 ENCOUNTER — Ambulatory Visit: Admitting: Internal Medicine

## 2023-10-20 VITALS — BP 120/76 | HR 107 | Temp 98.6°F | Ht 69.0 in | Wt 158.6 lb

## 2023-10-20 DIAGNOSIS — Z87891 Personal history of nicotine dependence: Secondary | ICD-10-CM

## 2023-10-20 DIAGNOSIS — J449 Chronic obstructive pulmonary disease, unspecified: Secondary | ICD-10-CM

## 2023-10-20 DIAGNOSIS — R918 Other nonspecific abnormal finding of lung field: Secondary | ICD-10-CM | POA: Diagnosis not present

## 2023-10-20 DIAGNOSIS — R059 Cough, unspecified: Secondary | ICD-10-CM | POA: Diagnosis not present

## 2023-10-20 MED ORDER — METHYLPREDNISOLONE ACETATE 80 MG/ML IJ SUSP
120.0000 mg | Freq: Once | INTRAMUSCULAR | Status: AC
Start: 2023-10-20 — End: 2023-10-20
  Administered 2023-10-20: 120 mg via INTRAMUSCULAR

## 2023-10-20 NOTE — Progress Notes (Unsigned)
 Catherine [name], female    DOB: January 24, 1946   MRN: [id number]   Brief patient profile:  48 yowf   quit smoking 2019  self referred to pulmonary clinic 10/20/2023 for uri/ cough       12/2021 spirometry that showed mild airflow obstruction with an FEV1 84%, ratio 67 and FVC 95%.  Exhaled nitric oxide testing was 7 ppb.     History of Present Illness  maint on trelegy 10/20/2023  Pulmonary/  ACUTE  office eval/Myna Freimark re 10/08/23 uri  then 10/16/23 UC rx levaquin/ prednisone  last day 10/20/2023  Chief Complaint  Patient presents with   Acute Visit  Dyspnea:  mostly housebound due to fatigue Cough: congested cough/ wheeze / allegra   > mucus is clear / neb helps the chest tightness   Sleep: flat bed / one pillow / bad cough in am  SABA use: every 4 hours either levo or alb 02 QIO:NGEX     No obvious day to day or daytime pattern/variability or assoc   mucus plugs or hemoptysis or cp or   or overt sinus or hb symptoms.    Also denies any obvious fluctuation of symptoms with weather or environmental changes or other aggravating or alleviating factors except as outlined above   No unusual exposure hx or h/o childhood pna/ asthma or knowledge of premature birth.  Current Allergies, Complete Past Medical History, Past Surgical History, Family History, and Social History were reviewed in [employer] record.  ROS  The following are not active complaints unless bolded Hoarseness, sore throat, dysphagia, dental problems, itching, sneezing,  nasal congestion or discharge of excess mucus or purulent secretions, ear ache,   fever, chills, sweats, unintended wt loss or wt gain, classically pleuritic or exertional cp,  orthopnea pnd or arm/hand swelling  or leg swelling, presyncope, palpitations, abdominal pain, anorexia, nausea, vomiting, diarrhea  or change in bowel habits or change in bladder habits, change in stools or change in urine, dysuria, hematuria,  rash, arthralgias, visual  complaints, headache, numbness, weakness or ataxia or problems with walking or coordination,  change in mood or  memory.             Outpatient Medications Prior to Visit  Medication Sig Dispense Refill   acetaminophen (TYLENOL) 500 MG tablet Take 1,000 mg by mouth at bedtime.     Azelastine HCl (ASTEPRO NA) Place 2 puffs into the nose daily.     CALCIUM CARBONATE ANTACID PO Take 1,000 mg by mouth daily.      carboxymethylcellulose (REFRESH PLUS) 0.5 % SOLN Place 2 drops into both eyes 3 (three) times daily.      Cholecalciferol (VITAMIN D3 PO) Take 6,000 Units by mouth daily.     Cyanocobalamin (VITAMIN B-12 PO) Take 1,200 mcg by mouth daily.     docusate sodium (COLACE) 100 MG capsule Take 200 mg by mouth daily.      esomeprazole (NEXIUM) 20 MG capsule Take 20 mg by mouth daily.     fexofenadine (ALLEGRA ODT) 30 MG disintegrating tablet Take 60 mg by mouth daily.     Fluticasone-Umeclidin-Vilant (TRELEGY ELLIPTA) 200-62.5-25 MCG/ACT AEPB Inhale 1 puff into the lungs daily. 1 each 11   gabapentin (NEURONTIN) 100 MG capsule Take 100 mg by mouth daily.     levalbuterol (XOPENEX HFA) 45 MCG/ACT inhaler Inhale 2 puffs into the lungs every 4 (four) hours as needed for wheezing. 1 each 2   LINZESS 72 MCG capsule  LORazepam (ATIVAN) 1 MG tablet Take 0.5-1 mg by mouth See admin instructions. Take 0.5 mg in the morning and 1.0 mg at bedtime     polyethylene glycol (MIRALAX / GLYCOLAX) packet Take 17 g by mouth daily.      senna (SENOKOT) 8.6 MG TABS tablet Take 2 tablets by mouth at bedtime.     Sodium Chloride-Sodium Bicarb (AYR SALINE NASAL RINSE NA) Place into the nose.     SUMAtriptan (IMITREX) 50 MG tablet Take 50 mg by mouth every 2 (two) hours as needed for migraine. May repeat in 2 hours if headache persists or recurs.     traMADol (ULTRAM) 50 MG tablet Take 50 mg by mouth every 6 (six) hours as needed for moderate pain.     trospium (SANCTURA) 20 MG tablet Take 1 tablet (20 mg total)  by mouth daily. 30 tablet 5   zoledronic acid (RECLAST) 5 MG/100ML SOLN injection Inject 5 mg into the vein.     albuterol (PROVENTIL) (2.5 MG/3ML) 0.083% nebulizer solution Take 3 mLs (2.5 mg total) by nebulization every 6 (six) hours as needed for wheezing or shortness of breath. 75 mL 5   No facility-administered medications prior to visit.    Past Medical History:  Diagnosis Date   Abdominal pain 06/06/2019   Anxiety    Aortic aneurysm (HCC) 12/08/2018   Arthritis    HANDS   BCC (basal cell carcinoma) 04/07/1995   right jawline (CX3, 5FU)   BCC (basal cell carcinoma) 05/19/2013   right scalp/post (treatment biopsy)   Chronic constipation    Complication of anesthesia    upon waking up from anesthesia patient experienced chest pain - EKG done was negative, patient states "it may have been indigestion"   COPD (chronic obstructive pulmonary disease) (HCC)    DDD (degenerative disc disease), lumbar    L2-3/3-4/4-5   Dizziness 01/17/2019   DOE (dyspnea on exertion) 01/17/2019   Dysplastic nevus 12/04/2009   mod-severe left forearm, exc   Dysuria    Frequency of urination    Ganglion cyst of right groin 11/23/2018   GERD (gastroesophageal reflux disease)    Hearing aid worn    Infected hernioplasty mesh (HCC) 01/26/2019   Keratosis, actinic 02/18/1991   left abdomen   Migraine    Mild emphysema (HCC)    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   Osteopenia 2019   TO OSTEOPOROSIS    Pelvic pain in female    Seroma, post-traumatic (HCC) 04/27/2019   Smokers' cough (HCC)    Thoracic aortic aneurysm (TAA) (HCC)    FOLLOWED BY DR Laneta Simmers  LAST CT CHEST  WAS 6-3 SEE EPIC    Urgency of urination    Wears glasses       Objective:     BP 120/76 (BP Location: Left Arm, Patient Position: Sitting, Cuff Size: Normal)   Pulse (!) 107   Temp 98.6 F (37 C) (Oral)   Ht 5\' 9"  (1.753 m)   Wt 158 lb 9.6 oz (71.9 kg)   SpO2 97%   BMI 23.42 kg/m   SpO2: 97 %amb pleasant wf  nad     HEENT : Oropharynx  clear nl  Nasal turbinates nl    NECK :  without  apparent JVD/ palpable Nodes/TM    LUNGS: no acc muscle use,  Min barrel / mod kyphotic contour chest wall with bilateral  slightly decreased bs and mostly upper airway wheezing  and  without cough on insp or exp maneuvers and  min  Hyperresonant  to  percussion bilaterally    CV:  RRR  no s3 or murmur or increase in P2, and no edema   ABD:  soft and nontender with pos end  insp Hoover's  in the supine position.  No bruits or organomegaly appreciated   MS:  Nl gait/ ext warm without deformities Or obvious joint restrictions  calf tenderness, cyanosis or clubbing     SKIN: warm and dry without lesions    NEURO:  alert, approp, nl sensorium with  no motor or cerebellar deficits apparent.        CXR PA and Lateral:   10/20/2023 :    I personally reviewed images and impression is as follows:     Mod copd/ no acute change    Assessment   No problem-specific Assessment & Plan notes found for this encounter.     Sandrea Hughs, MD 10/20/2023

## 2023-10-20 NOTE — Patient Instructions (Addendum)
 Depomedrol 120 mg IM   Try off trelegy   Plan A = Automatic = Always=    Breztri Take 2 puffs first thing in am and then another 2 puffs about 12 hours later.    Work on inhaler technique:  relax and gently blow all the way out then take a nice smooth full deep breath back in, triggering the inhaler at same time you start breathing in.  Hold breath in for at least  5 seconds if you can. Blow out breztri  thru nose. Rinse and gargle with water when done.  If mouth or throat bother you at all,  try brushing teeth/gums/tongue with arm and hammer toothpaste/ make a slurry and gargle and spit out.      Plan B = Backup (to supplement plan A, not to replace it) Only use your levoalbuteral inhaler as a rescue medication to be used if you can't catch your breath by resting or doing a relaxed purse lip breathing pattern.  - The less you use it, the better it will work when you need it. - Ok to use the inhaler up to 2 puffs  every 4 hours if you must but call for appointment if use goes up over your usual need - Don't leave home without it !!  (think of it like the spare tire for your car)   Plan C = Crisis (instead of Plan B but only if Plan B stops working) - only use your albuterol nebulizer if you first try Plan B and it fails to help > ok to use the nebulizer up to every 4 hours but if start needing it regularly call for immediate appointment  Depomedrol 120 mg IM  Maximum dose of mucinex dm = 1200 mg twice daily   While coughing > nexium should be Take 30- 60 min before your first and last meals of the day  Please remember to go to the  x-ray department  for your tests - we will call you with the results when they are available     Please schedule a follow up office visit in 4 weeks, sooner if needed to See [facility] NP or Dr Everardo All

## 2023-10-21 NOTE — Assessment & Plan Note (Signed)
 Quit smoking 2019  - 12/2021 spirometry that showed mild airflow obstruction with an FEV1 84%, ratio 67 and FVC 95%.  Exhaled nitric oxide testing was 7 ppb.   - 10/20/2023  After extensive coaching inhaler device,  effectiveness =    75% with hfa > try  breztri sample instead of trelegy in setting of "aecopd"  with mostly cough not sob   Aslo rx Depomedrol 120 mg IM  Max gerd rx while coughing plus diet Mucinex dm 1200 mg bid prn   F/u as previously planned, sooner if needed         Each maintenance medication was reviewed in detail including emphasizing most importantly the difference between maintenance and prns and under what circumstances the prns are to be triggered using an action plan format where appropriate.  Total time for H and P, chart review, counseling, reviewing hfa/ dpi/neb  device(s) and generating customized AVS unique to this office visit / same day charting = 31 min with pts new to me

## 2023-10-22 ENCOUNTER — Ambulatory Visit: Payer: PPO

## 2023-10-28 DIAGNOSIS — M1711 Unilateral primary osteoarthritis, right knee: Secondary | ICD-10-CM | POA: Diagnosis not present

## 2023-10-29 ENCOUNTER — Ambulatory Visit: Payer: PPO

## 2023-10-29 DIAGNOSIS — M6281 Muscle weakness (generalized): Secondary | ICD-10-CM

## 2023-10-29 DIAGNOSIS — R293 Abnormal posture: Secondary | ICD-10-CM

## 2023-10-29 DIAGNOSIS — M62838 Other muscle spasm: Secondary | ICD-10-CM

## 2023-10-29 DIAGNOSIS — R102 Pelvic and perineal pain: Secondary | ICD-10-CM

## 2023-10-29 NOTE — Therapy (Signed)
 OUTPATIENT PHYSICAL THERAPY FEMALE PELVIC TREATMENT   Patient Name: Catherine [name] MRN: [id number] DOB:08-Dec-1945, 78 y.o., female Today's Date: 10/29/2023  END OF SESSION:  PT End of Session - 10/29/23 1147     Visit Number 6    Date for PT Re-Evaluation 01/07/24    Authorization Type HTA    PT Start Time 1145    PT Stop Time 1225    PT Time Calculation (min) 40 min    Activity Tolerance Patient tolerated treatment well    Behavior During Therapy [facility] for tasks assessed/performed               Past Medical History:  Diagnosis Date   Abdominal pain 06/06/2019   Anxiety    Aortic aneurysm (HCC) 12/08/2018   Arthritis    HANDS   BCC (basal cell carcinoma) 04/07/1995   right jawline (CX3, 5FU)   BCC (basal cell carcinoma) 05/19/2013   right scalp/post (treatment biopsy)   Chronic constipation    Complication of anesthesia    upon waking up from anesthesia patient experienced chest pain - EKG done was negative, patient states "it may have been indigestion"   COPD (chronic obstructive pulmonary disease) (HCC)    DDD (degenerative disc disease), lumbar    L2-3/3-4/4-5   Dizziness 01/17/2019   DOE (dyspnea on exertion) 01/17/2019   Dysplastic nevus 12/04/2009   mod-severe left forearm, exc   Dysuria    Frequency of urination    Ganglion cyst of right groin 11/23/2018   GERD (gastroesophageal reflux disease)    Hearing aid worn    Infected hernioplasty mesh (HCC) 01/26/2019   Keratosis, actinic 02/18/1991   left abdomen   Migraine    Mild emphysema (HCC)    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   Osteopenia 2019   TO OSTEOPOROSIS    Pelvic pain in female    Seroma, post-traumatic (HCC) 04/27/2019   Smokers' cough (HCC)    Thoracic aortic aneurysm (TAA) (HCC)    FOLLOWED BY DR Laneta Simmers  LAST CT CHEST  WAS 6-3 SEE EPIC    Urgency of urination    Wears glasses    Past Surgical History:  Procedure Laterality Date   ABDOMINAL HYSTERECTOMY  1982   W/   UNILATERAL SALPINGOOPHORECTOMY   bilateral hernia surgery      removed ganglion cyst and swollen lymph node    BREAST BIOPSY Right    Years ago- benign   CHOLECYSTECTOMY OPEN  1987   W/  APPENDECTOMY   COLONOSCOPY  2002  &  12-2012 & 03/19/15   CYSTO WITH HYDRODISTENSION N/A 02/20/2014   Procedure: CYSTOSCOPY/HYDRODISTENSION WITH CHLOROPACTIN AND MARCAINE INSTILLATION;  Surgeon: Valetta Fuller, MD;  Location: [facility];  Service: Urology;  Laterality: N/A;   EXCISION MASS LOWER EXTREMETIES Right 11/02/2018   Procedure: REMOVAL OF RIGHT GROIN AND THIGH SUBCUTANEOUS MASSES.;  Surgeon: Karie Soda, MD;  Location: [facility];  Service: General;  Laterality: Right;   EYE SURGERY     bilateral cataract surgery    GROIN DISSECTION N/A 02/28/2019   Procedure: GROIN EXPLORATION AND DEBRIDEMENT;  Surgeon: Karie Soda, MD;  Location: [facility];  Service: General;  Laterality: N/A;   HERNIA REPAIR     INCISION AND DRAINAGE ABSCESS N/A 12/09/2018   Procedure: INCISION AND DRAINAGE RIGHT GROIN and thigh  WITH DEBRIDEMENT;  Surgeon: Karie Soda, MD;  Location: [facility];  Service: General;  Laterality: N/A;   IR CATHETER TUBE CHANGE  05/05/2019   IR  RADIOLOGIST EVAL & MGMT  05/03/2019   IR RADIOLOGIST EVAL & MGMT  05/10/2019   KNEE ARTHROSCOPY W/ MENISCAL REPAIR Right 2021   LAPAROSCOPIC ABDOMINAL EXPLORATION Right 02/28/2019   Procedure: LAPAROSCOPIC EXPLORATION WITH REMOVAL OF RIGHT PREPERITONEAL MESH;  Surgeon: Karie Soda, MD;  Location: [facility];  Service: General;  Laterality: Right;   LAPAROSCOPIC LYSIS OF ADHESIONS Bilateral 02/28/2019   Procedure: LAPAROSCOPIC LYSIS OF ADHESIONS;  Surgeon: Karie Soda, MD;  Location: [facility];  Service: General;  Laterality: Bilateral;   LAPAROSCOPIC UNILATERAL SALPINGOOPHORECTOMY  1984   LAPAROSCOPY N/A 01/24/2021   Procedure: LAPAROSCOPY DIAGNOSTIC;  Surgeon: Karie Soda, MD;  Location: [facility];  Service: General;  Laterality: N/A;   LESION  REMOVAL Right 02/28/2019   Procedure: EXCISION VAGINAL ABSCESS  AND DISSECTION;  Surgeon: Karie Soda, MD;  Location: [facility];  Service: General;  Laterality: Right;   MASS EXCISION Right 06/16/2018   Procedure: REMOVAL OF RIGHT GROIN SUBCUTANEOUS MASS ERAS PATHWAY;  Surgeon: Karie Soda, MD;  Location: [facility];  Service: General;  Laterality: Right;   removal of lymph node cyst in right groin      ROTATOR CUFF REPAIR Right 2003   and BONE SPUR   VENTRAL HERNIA REPAIR N/A 01/24/2021   Procedure: LYSIS OF ADHESIONS, RIGHT INGUINAL AND INCISIONAL HERNIA REPAIR WITH MESH.  TAP BLOCK;  Surgeon: Karie Soda, MD;  Location: [facility];  Service: General;  Laterality: N/A;   Patient Active Problem List   Diagnosis Date Noted   Overactive bladder 09/22/2023   SUI (stress urinary incontinence, female) 09/22/2023   Vaginal atrophy 09/22/2023   Ear pain, right 03/19/2023   Chronic rhinitis 04/14/2022   COVID-19 long hauler 01/16/2022   COVID-19 virus infection 11/20/2021   CAP (community acquired pneumonia) 11/20/2021   COPD GOLD 1 10/16/2021   Abdominal pain 06/06/2019   Seroma, post-traumatic (HCC) 04/27/2019   Medication monitoring encounter 03/31/2019   Abscess of groin, right 02/28/2019   Infected hernioplasty mesh (HCC) 01/26/2019   Dizziness 01/17/2019   DOE (dyspnea on exertion) 01/17/2019   Mycobacterium fortuitum infection    Thigh abscess    Aortic aneurysm (HCC) 12/08/2018   Soft tissue infection - Mycobaterium fortuitum   12/08/2018   Cellulitis of right groin 12/08/2018   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   Groin abscess 06/16/2018   Anxiety    Urgency of urination    DDD (degenerative disc disease), lumbar    Osteopenia 08/04/2017   Right sided sciatica 02/18/2017   Piriformis muscle pain 02/18/2017   Right groin pain 11/04/2016   Neuropathy 11/04/2016   Numbness 11/04/2016   Depression with anxiety 11/04/2016   Dysfunction of right eustachian tube 10/30/2015    Presbycusis of both ears 10/30/2015   Sensory hearing loss, bilateral 10/30/2015   Subjective tinnitus of both ears 10/30/2015   Atypical chest pain 05/08/2014    PCP: Mila Palmer, MD  REFERRING PROVIDER: Richarda Overlie, MD   REFERRING DIAG: N81.10 (ICD-10-CM) - Cystocele, unspecified  THERAPY DIAG:  Abnormal posture  Muscle weakness (generalized)  Other muscle spasm  Pelvic pain  Rationale for Evaluation and Treatment: Rehabilitation  ONSET DATE: 2 years ago  SUBJECTIVE:  SUBJECTIVE STATEMENT: Pt states that she has not had much vaginal burning  PAIN: 10/29/23 Are you having pain? Yes NPRS scale: 3/10 Pain location: Vaginal and abdomen pain  Pain type: burning Pain description: constant   Aggravating factors: after urination Relieving factors: nothing helps   PRECAUTIONS: Other: fall risk, osteopenia   RED FLAGS: None   WEIGHT BEARING RESTRICTIONS: No  FALLS:  Has patient fallen in last 6 months? No  LIVING ENVIRONMENT: Lives with: lives with their family Lives in: House/apartment  OCCUPATION: retired Chiropractor  PLOF: Independent  PATIENT GOALS: decrease pain, strengthen core   PERTINENT HISTORY:  Osteopenia, G1P1, hysterectomy on her 30s, slow transit constipation, hemorrhoids, 7 surgeries in Rt groin (inguinal and femoral hernia repairs, various cysts, infection), ventral hernia  BOWEL MOVEMENT: Pain with bowel movement: No Type of bowel movement:Frequency daily and Strain Yes Fully empty rectum: No Leakage: No Pads: No Fiber supplement: No  URINATION: Pain with urination: Yes Fully empty bladder: No Stream:  start and stop Urgency: Yes: doesn't leak Frequency: at least every hour; 1x at 5 nocturia  Leakage: Coughing, Sneezing, and  Laughing Pads: No  INTERCOURSE: Not sexually active   PREGNANCY: Vaginal deliveries 1  Tearing No C-section deliveries 0  Currently pregnant No  PROLAPSE: Vaginal pressure    OBJECTIVE:  Note: Objective measures were completed at Evaluation unless otherwise noted. 10/15/23: PFIQ-7: 33 Trigger points present in Rt glutes with tenderness  Improving deep core coordination with breath with better pressure management  08/10/23:  COGNITION: Overall cognitive status: Within functional limits for tasks assessed     SENSATION: Light touch: Appears intact Proprioception: Appears intact  FUNCTIONAL TESTS:  Curl-up test: abdominal distortion, worse in Rt lower quadrant  GAIT: Comments: WNL  POSTURE: rounded shoulders, forward head, and increased thoracic kyphosis   PALPATION:   General  Tenderness over bladder with urgency, tenderness in Rt lower quadrant                External Perineal Exam dryness, phimosis, labial fusion                             Internal Pelvic Floor adhesions and stricture, low tone, discomfort with palpation  Patient confirms identification and approves PT to assess internal pelvic floor and treatment Yes  PELVIC MMT:   MMT eval  Vaginal 2/5, 2 second endurance, 7 repeat contractions, decreased coordination and co-contraction of glutes   Diastasis Recti Notable abdominal bulge at midline and in Rt lower quadrant  (Blank rows = not tested)        TONE: low  PROLAPSE: Grade 1 anterior vaginal wall laxity in supine mid-day  TODAY'S TREATMENT:                                                                                                                              DATE:  10/29/23 Manual: Trigger  Point Dry Needling  Initial Treatment: Pt instructed on Dry Needling rational, procedures, and possible side effects. Pt instructed to expect mild to moderate muscle soreness later in the day and/or into the next day.  Pt instructed in methods to  reduce muscle soreness. Pt instructed to continue prescribed HEP. Patient was educated on signs and symptoms of infection and other risk factors and advised to seek medical attention should they occur.  Patient verbalized understanding of these instructions and education.   Patient Verbal Consent Given: Yes Education Handout Provided: Yes Muscles Treated: Rt glutes and Rt QL/lumbar paraspinals Electrical Stimulation Performed: No Treatment Response/Outcome: twitch response/release Abdominal scar tissue mobilization  Soft tissue mobilization to Rt lower quadrant Soft tissue mobilization to lumbar paraspinals and Rt glutes Exercises: Lower trunk rotation 2 x 10 Single knee to chest 5x bil Seated piriformis stretch 2 x 60 seconds bil Therapeutic activities: Sidestepping red band at table for support 5 laps 3 way kick 10x each, bil Squats to table 2 x 10    10/15/23 Manual: Trigger Point Dry Needling  Initial Treatment: Pt instructed on Dry Needling rational, procedures, and possible side effects. Pt instructed to expect mild to moderate muscle soreness later in the day and/or into the next day.  Pt instructed in methods to reduce muscle soreness. Pt instructed to continue prescribed HEP. Patient was educated on signs and symptoms of infection and other risk factors and advised to seek medical attention should they occur.  Patient verbalized understanding of these instructions and education.   Patient Verbal Consent Given: Yes Education Handout Provided: Yes Muscles Treated: Rt glutes and Rt QL/lumbar paraspinals Electrical Stimulation Performed: No Treatment Response/Outcome: twitch response/release Abdominal scar tissue mobilization  Soft tissue mobilization to Rt lower quadrant Soft tissue mobilization to lumbar paraspinals and Rt glutes Neuromuscular re-education: Standing shoulder extensions red band 2 x 10 Standing shoulder extensions with march 2 x 10 red band  Pallof  press 2 x 10 bil red band  Side lying clam shell 2 x 10  08/31/23 Manual: Trigger Point Dry Needling  Initial Treatment: Pt instructed on Dry Needling rational, procedures, and possible side effects. Pt instructed to expect mild to moderate muscle soreness later in the day and/or into the next day.  Pt instructed in methods to reduce muscle soreness. Pt instructed to continue prescribed HEP. Patient was educated on signs and symptoms of infection and other risk factors and advised to seek medical attention should they occur.  Patient verbalized understanding of these instructions and education.   Patient Verbal Consent Given: Yes Education Handout Provided: Yes Muscles Treated: Rt glutes and Rt QL/lumbar paraspinals Electrical Stimulation Performed: No Treatment Response/Outcome: twitch response/release Abdominal scar tissue mobilization  Soft tissue mobilization to Rt lower quadrant Soft tissue mobilization to lumbar paraspinals and Rt glutes Exercises: Single knee to chest 10x bil Lower trunk rotation 2 x 10 Seated clam without resistance 2 x 10 Unilateral supine butterfly 60 seconds bil with support under Rt knee    PATIENT EDUCATION:  Education details: See above Person educated: Patient Education method: Programmer, multimedia, Demonstration, Tactile cues, Verbal cues, and Handouts Education comprehension: verbalized understanding  HOME EXERCISE PROGRAM: [id number]  ASSESSMENT:  CLINICAL IMPRESSION: Pt doing very well overall with good improvements in vaginal burning in addition to urinary urgency. Believe that the burning she is having from use of estrogen cream may be due to use of the applicator; we discussed trying to put cream internally with use of just finger to see if this  is more comfortable. We continued dry needling since it has been very helpful in reducing pain; believe that she will continue to benefit from dry needling due to paralysis of Rt sided abdominal muscles.  She did very well with exercises and we discussed why it is important to do stretches and exercises after dry needling compared to before. She will continue to benefit from skilled PT intervention in order to decrease vaginal/pelvic pain and pressure, improve bladder function, increase appropriate core pressure management, and begin/progress functional strengthening program.   OBJECTIVE IMPAIRMENTS: decreased activity tolerance, decreased coordination, decreased endurance, decreased strength, increased fascial restrictions, increased muscle spasms, impaired tone, postural dysfunction, and pain.   ACTIVITY LIMITATIONS: continence  PARTICIPATION LIMITATIONS: community activity  PERSONAL FACTORS: 1 comorbidity: medical history  are also affecting patient's functional outcome.   REHAB POTENTIAL: Good  CLINICAL DECISION MAKING: Stable/uncomplicated  EVALUATION COMPLEXITY: Low   GOALS: Goals reviewed with patient? Yes  SHORT TERM GOALS: Target date: 09/07/23 - updated 10/15/23  Pt will be independent with HEP.   Baseline: Goal status: MET 10/15/23  2.  Pt will be independent with the knack, urge suppression technique, and double voiding in order to improve bladder habits and decrease urinary incontinence.   Baseline:  Goal status: MET 10/15/23  3.  Pt will be independent with use of squatty potty, relaxed toileting mechanics, and improved bowel movement techniques in order to increase ease of bowel movements and complete evacuation.   Baseline:  Goal status: MET 10/15/23  4.  Pt will report improvement in pelvic/vaginal pain to no greater than 2/10.  Baseline:  Goal status: IN PROGRESS 10/15/23  5.  Pt will be independent with vulvovaginal massage and perform on daily basis to help improve pain Baseline:  Goal status: MET 10/15/23  6.  Pt will increase pelvic floor muscle strength to 3/5.  Baseline:  Goal status: IN PROGRESS  LONG TERM GOALS: Target date: 10/05/23 - updated  10/15/23  Pt will be independent with advanced HEP.   Baseline:  Goal status: IN PROGRESS 10/15/23  2.  Pt will increase pelvic floor muscle strength to 4/5.  Baseline:  Goal status: IN PROGRESS 10/15/23  3.  Pt will increase pelvic floor muscle endurance to more than 10 seconds.  Baseline:  Goal status: IN PROGRESS 10/15/23  4.  Pt will report 0/10 vaginal/pelvic pain.  Baseline:  Goal status: IN PROGRESS 10/15/23  5.  Pt will be able to go 2-3 hours in between voids without urgency or incontinence in order to improve QOL and perform all functional activities with less difficulty.   Baseline:  Goal status: IN PROGRESS 10/15/23  6.  Pt will be able to perform curl-up test without any abdominal distortion.  Baseline:  Goal status: IN PROGRESS 10/15/23  PLAN:  PT FREQUENCY: 1-2x/week  PT DURATION: 12 weeks  PLANNED INTERVENTIONS: 97110-Therapeutic exercises, 97530- Therapeutic activity, 97112- Neuromuscular re-education, 97535- Self Care, 29562- Manual therapy, Dry Needling, and Biofeedback  PLAN FOR NEXT SESSION: teach bowel mobilization, the knack; core training   Julio Alm, PT, DPT03/27/2512:24 PM

## 2023-11-03 ENCOUNTER — Ambulatory Visit: Payer: PPO | Admitting: Obstetrics and Gynecology

## 2023-11-06 DIAGNOSIS — M1711 Unilateral primary osteoarthritis, right knee: Secondary | ICD-10-CM | POA: Diagnosis not present

## 2023-11-10 DIAGNOSIS — Z6822 Body mass index (BMI) 22.0-22.9, adult: Secondary | ICD-10-CM | POA: Diagnosis not present

## 2023-11-10 DIAGNOSIS — E7849 Other hyperlipidemia: Secondary | ICD-10-CM | POA: Diagnosis not present

## 2023-11-10 DIAGNOSIS — M810 Age-related osteoporosis without current pathological fracture: Secondary | ICD-10-CM | POA: Diagnosis not present

## 2023-11-27 ENCOUNTER — Encounter (HOSPITAL_BASED_OUTPATIENT_CLINIC_OR_DEPARTMENT_OTHER): Payer: Self-pay | Admitting: Pulmonary Disease

## 2023-11-27 ENCOUNTER — Ambulatory Visit (HOSPITAL_BASED_OUTPATIENT_CLINIC_OR_DEPARTMENT_OTHER): Admitting: Pulmonary Disease

## 2023-11-27 VITALS — BP 128/69 | HR 82 | Ht 69.0 in | Wt 155.6 lb

## 2023-11-27 DIAGNOSIS — Z8616 Personal history of COVID-19: Secondary | ICD-10-CM | POA: Diagnosis not present

## 2023-11-27 DIAGNOSIS — R9389 Abnormal findings on diagnostic imaging of other specified body structures: Secondary | ICD-10-CM | POA: Diagnosis not present

## 2023-11-27 DIAGNOSIS — J449 Chronic obstructive pulmonary disease, unspecified: Secondary | ICD-10-CM

## 2023-11-27 DIAGNOSIS — R0981 Nasal congestion: Secondary | ICD-10-CM | POA: Diagnosis not present

## 2023-11-27 MED ORDER — PREDNISONE 10 MG PO TABS: ORAL_TABLET | ORAL | 0 refills | Status: AC

## 2023-11-27 MED ORDER — BREZTRI AEROSPHERE 160-9-4.8 MCG/ACT IN AERO: 2.0000 | INHALATION_SPRAY | Freq: Two times a day (BID) | RESPIRATORY_TRACT | Status: DC

## 2023-11-27 NOTE — Patient Instructions (Signed)
--  Sample of Breztri provided. Resume Trelegy after completing sample --START prednisone  taper --ORDER CT Chest without contrast for persistent symptoms --Schedule pulmonary function tests with NP follow-up at [facility] or [facility] (prefers [facility] if possible)

## 2023-11-27 NOTE — Progress Notes (Signed)
 Subjective:   PATIENT ID: Catherine [name] GENDER: female DOB: [birth year], MRN: [id number]   HPI  Chief Complaint  Patient presents with   Follow-up    COPD    Reason for Visit: Follow-up  Catherine [name] is a 78 year old female with COPD, sensory hearing loss and osteopenia who presents for follow-up  Initial consult Her main symptom is shortness of breath that occurs constantly with rest and activity. Worsens with changes in hot or cold weather. Does improve with rescue inhaler. She is wanting assistance in deciding the appropriate inhaler for her symptoms. Previously on Advair and switched to Symbicort. Tried Breztri  for one year however had oral thrush/mouth sores. Treated in December 2022. Currently on Anoro and requires Albuterol  3-4 times a week for shortness of breath and chest tightness. No wheezing or cough.  She was seen by her PCP on 10/02/21 and reported that Anoro was not as effective as she hoped.  01/16/22 Since our last visit she [name] had multiple COPD/sinus exacerbations since her COVID-19 infection March 2023. Total of four antibiotics and four prednisone  courses.She was transitioned from Symbicort to Trelegy. She currently reports with nasal congestions. Scheduled with ENT wit PA Abbott Abbot. Cough is triggered by post-nasal drainage. Compliant with Trelegy. Breathing issues can be triggered in the heat. Still using albuterol  once a day  03/19/23 Since our last visit she reports that [name] been stable on Trelegy. Denies shortness of breath, cough or wheezing. Prefers xopenx which she rarely uses. Last exacerbation in September She does report some chronic right ear congestion previously requiring tube in Feb s/p removal. [name] distorted her hearing. [name] been following ENT, Dr. Tellis Feathers.  11/27/23 Since our last visit she was seen by Dr. Waymond Hailey for an acute visit for COPD exacerbation after being seen at urgent care for prednisone  and Levaquin. She felt like it was flu-like  symptoms. Treated with Breztri  sample, depo shot, mucinex  and PPI. Compared to her Trelegy, Breztri  helps better but [name] caused thrush in the past. Feeling very fatigued. Less active recently. Having more mucous production and needing to use levabuterol twice a day.  Prior inhalers: Advair Symbicort Breztri  - thrush/mouth sores Anoro - ineffective Currently on Trelegy - stable symptoms   Social History: Quit smoking 4 years ago 36 pack years.   Past Medical History:  Diagnosis Date   Abdominal pain 06/06/2019   Anxiety    Aortic aneurysm (HCC) 12/08/2018   Arthritis    HANDS   BCC (basal cell carcinoma) 04/07/1995   right jawline (CX3, 5FU)   BCC (basal cell carcinoma) 05/19/2013   right scalp/post (treatment biopsy)   Chronic constipation    Complication of anesthesia    upon waking up from anesthesia patient experienced chest pain - EKG done was negative, patient states "it may have been indigestion"   COPD (chronic obstructive pulmonary disease) (HCC)    DDD (degenerative disc disease), lumbar    L2-3/3-4/4-5   Dizziness 01/17/2019   DOE (dyspnea on exertion) 01/17/2019   Dysplastic nevus 12/04/2009   mod-severe left forearm, exc   Dysuria    Frequency of urination    Ganglion cyst of right groin 11/23/2018   GERD (gastroesophageal reflux disease)    Hearing aid worn    Infected hernioplasty mesh (HCC) 01/26/2019   Keratosis, actinic 02/18/1991   left abdomen   Migraine    Mild emphysema (HCC)    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   Osteopenia 2019   TO OSTEOPOROSIS  Pelvic pain in female    Seroma, post-traumatic (HCC) 04/27/2019   Smokers' cough (HCC)    Thoracic aortic aneurysm (TAA) (HCC)    FOLLOWED BY DR Sherene Dilling  LAST CT CHEST  WAS 6-3 SEE EPIC    Urgency of urination    Wears glasses      Family History  Problem Relation Age of Onset   Esophageal cancer Mother 40   Lung cancer Father 72   Breast cancer Maternal Aunt      Social History    Occupational History   Not on file  Tobacco Use   Smoking status: Former    Current packs/day: 0.00    Average packs/day: 0.8 packs/day for 48.0 years (36.0 ttl pk-yrs)    Types: Cigarettes    Start date: 09/04/1969    Quit date: 09/04/2017    Years since quitting: 6.2   Smokeless tobacco: Never  Vaping Use   Vaping status: Never Used  Substance and Sexual Activity   Alcohol  use: No   Drug use: No   Sexual activity: Not Currently    Allergies  Allergen Reactions   Ciprofloxacin  Hcl Other (See Comments)    Aortic aneurysm per MD   Metoprolol Other (See Comments) and Cough    and wheezing   Penicillins Shortness Of Breath, Rash and Other (See Comments)    [name] patient had a PCN reaction causing immediate rash, facial/tongue/throat swelling, SOB or lightheadedness with hypotension: Yes [name] patient had a PCN reaction causing severe rash involving mucus membranes or skin necrosis: No [name] patient had a PCN reaction that required hospitalization: Yes - MD office [name] patient had a PCN reaction occurring within the last 10 years: No If all of the above answers are "NO", then may proceed with Cephalosporin use.    Oxycodone  Other (See Comments)    Nightmares.  Tolerates tramadol    Advair Diskus [Fluticasone -Salmeterol] Anxiety   Fosamax [Alendronate] Other (See Comments)    Other reaction(s): GERD     Outpatient Medications Prior to Visit  Medication Sig Dispense Refill   acetaminophen  (TYLENOL ) 500 MG tablet Take 1,000 mg by mouth at bedtime.     Azelastine HCl (ASTEPRO NA) Place 2 puffs into the nose daily.     CALCIUM  CARBONATE ANTACID PO Take 1,000 mg by mouth daily.      carboxymethylcellulose (REFRESH PLUS) 0.5 % SOLN Place 2 drops into both eyes 3 (three) times daily.      Cholecalciferol  (VITAMIN D3 PO) Take 6,000 Units by mouth daily.     Cyanocobalamin  (VITAMIN B-12 PO) Take 1,200 mcg by mouth daily.     docusate sodium  (COLACE) 100 MG capsule Take 200 mg by mouth daily.       esomeprazole (NEXIUM) 20 MG capsule Take 20 mg by mouth daily.     fexofenadine (ALLEGRA ODT) 30 MG disintegrating tablet Take 60 mg by mouth daily.     Fluticasone -Umeclidin-Vilant (TRELEGY ELLIPTA ) 200-62.5-25 MCG/ACT AEPB Inhale 1 puff into the lungs daily. 1 each 11   gabapentin  (NEURONTIN ) 100 MG capsule Take 100 mg by mouth daily.     levalbuterol  (XOPENEX  HFA) 45 MCG/ACT inhaler Inhale 2 puffs into the lungs every 4 (four) hours as needed for wheezing. 1 each 2   LINZESS 72 MCG capsule      LORazepam  (ATIVAN ) 1 MG tablet Take 0.5-1 mg by mouth See admin instructions. Take 0.5 mg in the morning and 1.0 mg at bedtime     polyethylene glycol (MIRALAX  / GLYCOLAX )  packet Take 17 g by mouth daily.      senna (SENOKOT) 8.6 MG TABS tablet Take 2 tablets by mouth at bedtime.     Sodium Chloride -Sodium Bicarb (AYR SALINE NASAL RINSE NA) Place into the nose.     SUMAtriptan  (IMITREX ) 50 MG tablet Take 50 mg by mouth every 2 (two) hours as needed for migraine. May repeat in 2 hours if headache persists or recurs.     traMADol  (ULTRAM ) 50 MG tablet Take 50 mg by mouth every 6 (six) hours as needed for moderate pain.     zoledronic  acid (RECLAST ) 5 MG/100ML SOLN injection Inject 5 mg into the vein.     albuterol  (PROVENTIL ) (2.5 MG/3ML) 0.083% nebulizer solution Take 3 mLs (2.5 mg total) by nebulization every 6 (six) hours as needed for wheezing or shortness of breath. 75 mL 5   trospium  (SANCTURA ) 20 MG tablet Take 1 tablet (20 mg total) by mouth daily. 30 tablet 5   No facility-administered medications prior to visit.    Review of Systems  Constitutional:  Positive for malaise/fatigue. Negative for chills, diaphoresis, fever and weight loss.  HENT:  Negative for congestion.   Respiratory:  Negative for cough, hemoptysis, sputum production, shortness of breath and wheezing.   Cardiovascular:  Negative for chest pain, palpitations and leg swelling.     Objective:   Vitals:   11/27/23  1028  BP: 128/69  Pulse: 82  SpO2: 99%  Weight: 155 lb 9.6 oz (70.6 kg)  Height: 5\' 9"  (1.753 m)    SpO2: 99 %  Physical Exam: General: Well-appearing, no acute distress HENT: [place], AT Eyes: EOMI, no scleral icterus Respiratory: Clear to auscultation bilaterally.  No crackles, wheezing or rales Cardiovascular: RRR, -M/R/G, no JVD Extremities:-Edema,-tenderness Neuro: AAO x4, CNII-XII grossly intact Psych: Normal mood, normal affect  Data Reviewed:  Imaging: CT Chest 09/05/20 - Mild centrilobular emphysema. LLL solid 3 mm nodule along left major fissure, stable compared to 01/18/19 CTA CT Chest 01/16/22 - Mild centrilobular emphysema. Unchanged mild linear scarring in RML. Stable LLL 3 mm solid nodule. Unchanged thoracic lesions  CT Chest 09/11/22 - Unchanged CXR 10/20/23 - No infiltrate or effusion. Bronchial wall thickening and scarring/atelectasis in lingula that is new.  PFT: 12/24/22 PRE 2 Efforts FVC FEV1 FEV1/FVC FEF25% FEF50%  Note   Predicted 3.50 2.64 74% 5.59 2.52    LLN 2.68 1.94 65% 3.07 1.27    1EFFORT 3.17  90% 2.12  80% 67%  90% 3.10  55% 2.06  82%     2BEST 3.36  96% 2.25  85% 67%  90% 4.09  73% 2.36  93%     Interpretation: Normal spirometry  Labs: CBC    Component Value Date/Time   WBC 7.2 01/13/2023 1217   RBC 4.56 01/13/2023 1217   HGB 13.6 01/13/2023 1217   HCT 40.5 01/13/2023 1217   PLT 230 01/13/2023 1217   MCV 88.8 01/13/2023 1217   MCH 29.8 01/13/2023 1217   MCHC 33.6 01/13/2023 1217   RDW 13.1 01/13/2023 1217   LYMPHSABS 1.4 12/24/2021 1309   MONOABS 0.4 12/24/2021 1309   EOSABS 0.1 12/24/2021 1309   BASOSABS 0.0 12/24/2021 1309   Absolute eos 12/24/21 -100    Assessment & Plan:   Discussion: 78 year old female with COPD, sensory hearing loss and osteopenia who presents for follow-up. Previously diagnosed with covid in 10/2021 with persistent symptoms despite multiple courses of antibiotics and steroids. [name] trialed multiple inhalers  and currently  well-controlled on Trelegy.  Today with worsening fatigue and shortness of breath after recent respiratory infection, presumed viral >6 weeks ago. Concerned about her symptoms leading to chronic/worsening lung disease.   COPD - worsening Hx COVID long hauler --Sample of Breztri provided. Resume Trelegy 200 after completing sample --START prednisone  taper --ORDER CT Chest without contrast for persistent symptoms --Schedule pulmonary function tests with NP follow-up at [facility] or [facility] (prefers [facility] if possible) --CONTINUE Xopenex  for AS NEEDED  Nasal/ear congestion - worsened since COVID --CONTINUE Zyrtec 10 mg daily --Keep ENT follow-up  Health Maintenance Immunization History  Administered Date(s) Administered   DTaP 05/09/2006   Fluad Quad(high Dose 65+) 05/05/2019   H1N1 05/26/2008   Influenza Split 05/20/2008, 05/29/2009, 04/24/2010, 05/13/2011, 04/20/2012, 05/13/2013   Influenza, High Dose Seasonal PF 05/14/2021   Influenza,inj,Quad PF,6+ Mos 04/15/2016, 03/27/2017, 05/06/2018, 05/10/2019, 05/08/2020   Influenza,inj,quad, With Preservative 04/27/2014, 04/27/2015   Influenza-Unspecified 04/04/2014, 05/06/2018   Moderna Covid-19 Vaccine Bivalent Booster 93yrs & up 05/20/2021   Moderna SARS-COV2 Booster Vaccination 06/06/2020, 12/04/2020   Moderna Sars-Covid-2 Vaccination 09/02/2019, 09/30/2019, 06/05/2020, 12/03/2020   PFIZER Comirnaty(Gray Top)Covid-19 Tri-Sucrose Vaccine 05/22/2023   PNEUMOCOCCAL CONJUGATE-20 09/19/2022   Pneumococcal Conjugate-13 04/04/2014   Pneumococcal Polysaccharide-23 06/06/2009   Pneumococcal-Unspecified 06/06/2009, 04/04/2014   Td 09/10/2018   Tdap 09/10/2018   Zoster, Live 07/11/2009, 03/12/2021, 08/13/2021   CT Lung Screen- discuss at next visit  Orders Placed This Encounter  Procedures   CT Chest Wo Contrast    Standing Status:   Future    Expiration Date:   11/26/2024    Preferred imaging location?:   [facility]   Pulmonary function test    Standing Status:   Future    Expiration Date:   11/26/2024    Where should this test be performed?:   Outpatient Pulmonary    What type of PFT is being ordered?:   Full PFT   Meds ordered this encounter  Medications   predniSONE  (DELTASONE ) 10 MG tablet    Sig: Take 4 tablets (40 mg total) by mouth daily with breakfast for 2 days, THEN 3 tablets (30 mg total) daily with breakfast for 2 days, THEN 2 tablets (20 mg total) daily with breakfast for 2 days, THEN 1 tablet (10 mg total) daily with breakfast for 2 days.    Dispense:  20 tablet    Refill:  0   budeson-glycopyrrolate-formoterol  (BREZTRI AEROSPHERE) 160-9-4.8 MCG/ACT AERO inhaler    Sig: Inhale 2 puffs into the lungs in the morning and at bedtime.    Lot Number?:   [id number]    Expiration Date?:   05/03/2026    NDC:   4782-9562-13 [086578]    Quantity:   1    Return in about 6 months (around 05/28/2024).  I have spent a total time of 32-minutes on the day of the appointment including chart review, data review, collecting history, coordinating care and discussing medical diagnosis and plan with the patient/family. Past medical history, allergies, medications were reviewed. Pertinent imaging, labs and tests included in this note have been reviewed and interpreted independently by me.  Christipher Rieger Genetta Kenning, MD [facility] 11/27/2023 10:40 AM

## 2023-11-30 ENCOUNTER — Ambulatory Visit (HOSPITAL_BASED_OUTPATIENT_CLINIC_OR_DEPARTMENT_OTHER)
Admission: RE | Admit: 2023-11-30 | Discharge: 2023-11-30 | Disposition: A | Discharge status: Home / Self Care | Source: Ambulatory Visit | Attending: Pulmonary Disease | Admitting: Pulmonary Disease

## 2023-11-30 DIAGNOSIS — R911 Solitary pulmonary nodule: Secondary | ICD-10-CM | POA: Diagnosis not present

## 2023-11-30 DIAGNOSIS — J439 Emphysema, unspecified: Secondary | ICD-10-CM | POA: Diagnosis not present

## 2023-11-30 DIAGNOSIS — R053 Chronic cough: Secondary | ICD-10-CM | POA: Diagnosis not present

## 2023-11-30 DIAGNOSIS — R9389 Abnormal findings on diagnostic imaging of other specified body structures: Secondary | ICD-10-CM | POA: Insufficient documentation

## 2023-11-30 DIAGNOSIS — R918 Other nonspecific abnormal finding of lung field: Secondary | ICD-10-CM | POA: Diagnosis not present

## 2023-12-08 ENCOUNTER — Ambulatory Visit: Attending: Obstetrics and Gynecology

## 2023-12-08 ENCOUNTER — Telehealth (HOSPITAL_BASED_OUTPATIENT_CLINIC_OR_DEPARTMENT_OTHER): Payer: Self-pay

## 2023-12-08 DIAGNOSIS — R102 Pelvic and perineal pain: Secondary | ICD-10-CM | POA: Diagnosis not present

## 2023-12-08 DIAGNOSIS — M6281 Muscle weakness (generalized): Secondary | ICD-10-CM | POA: Diagnosis not present

## 2023-12-08 DIAGNOSIS — R293 Abnormal posture: Secondary | ICD-10-CM | POA: Diagnosis not present

## 2023-12-08 DIAGNOSIS — M62838 Other muscle spasm: Secondary | ICD-10-CM | POA: Diagnosis not present

## 2023-12-08 NOTE — Telephone Encounter (Signed)
 Would you please look at this for Dr Washington Hacker and advise

## 2023-12-08 NOTE — Therapy (Signed)
 OUTPATIENT PHYSICAL THERAPY FEMALE PELVIC TREATMENT   Patient Name: Catherine [name] MRN: [id number] DOB:1946-04-05, 78 y.o., female Today's Date: 12/08/2023  END OF SESSION:  PT End of Session - 12/08/23 1444     Visit Number 7    Authorization Type HTA    PT Start Time 1445    PT Stop Time 1525    PT Time Calculation (min) 40 min    Activity Tolerance Patient tolerated treatment well    Behavior During Therapy [facility] for tasks assessed/performed                Past Medical History:  Diagnosis Date   Abdominal pain 06/06/2019   Anxiety    Aortic aneurysm (HCC) 12/08/2018   Arthritis    HANDS   BCC (basal cell carcinoma) 04/07/1995   right jawline (CX3, 5FU)   BCC (basal cell carcinoma) 05/19/2013   right scalp/post (treatment biopsy)   Chronic constipation    Complication of anesthesia    upon waking up from anesthesia patient experienced chest pain - EKG done was negative, patient states "it may have been indigestion"   COPD (chronic obstructive pulmonary disease) (HCC)    DDD (degenerative disc disease), lumbar    L2-3/3-4/4-5   Dizziness 01/17/2019   DOE (dyspnea on exertion) 01/17/2019   Dysplastic nevus 12/04/2009   mod-severe left forearm, exc   Dysuria    Frequency of urination    Ganglion cyst of right groin 11/23/2018   GERD (gastroesophageal reflux disease)    Hearing aid worn    Infected hernioplasty mesh (HCC) 01/26/2019   Keratosis, actinic 02/18/1991   left abdomen   Migraine    Mild emphysema (HCC)    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   Osteopenia 2019   TO OSTEOPOROSIS    Pelvic pain in female    Seroma, post-traumatic (HCC) 04/27/2019   Smokers' cough (HCC)    Thoracic aortic aneurysm (TAA) (HCC)    FOLLOWED BY DR Sherene Dilling  LAST CT CHEST  WAS 6-3 SEE EPIC    Urgency of urination    Wears glasses    Past Surgical History:  Procedure Laterality Date   ABDOMINAL HYSTERECTOMY  1982   W/  UNILATERAL SALPINGOOPHORECTOMY   bilateral  hernia surgery      removed ganglion cyst and swollen lymph node    BREAST BIOPSY Right    Years ago- benign   CHOLECYSTECTOMY OPEN  1987   W/  APPENDECTOMY   COLONOSCOPY  2002  &  12-2012 & 03/19/15   CYSTO WITH HYDRODISTENSION N/A 02/20/2014   Procedure: CYSTOSCOPY/HYDRODISTENSION WITH CHLOROPACTIN AND MARCAINE  INSTILLATION;  Surgeon: Livingston Rigg, MD;  Location: [facility];  Service: Urology;  Laterality: N/A;   EXCISION MASS LOWER EXTREMETIES Right 11/02/2018   Procedure: REMOVAL OF RIGHT GROIN AND THIGH SUBCUTANEOUS MASSES.;  Surgeon: Candyce Champagne, MD;  Location: [facility];  Service: General;  Laterality: Right;   EYE SURGERY     bilateral cataract surgery    GROIN DISSECTION N/A 02/28/2019   Procedure: GROIN EXPLORATION AND DEBRIDEMENT;  Surgeon: Candyce Champagne, MD;  Location: [facility];  Service: General;  Laterality: N/A;   HERNIA REPAIR     INCISION AND DRAINAGE ABSCESS N/A 12/09/2018   Procedure: INCISION AND DRAINAGE RIGHT GROIN and thigh  WITH DEBRIDEMENT;  Surgeon: Candyce Champagne, MD;  Location: [facility];  Service: General;  Laterality: N/A;   IR CATHETER TUBE CHANGE  05/05/2019   IR RADIOLOGIST EVAL & MGMT  05/03/2019  IR RADIOLOGIST EVAL & MGMT  05/10/2019   KNEE ARTHROSCOPY W/ MENISCAL REPAIR Right 2021   LAPAROSCOPIC ABDOMINAL EXPLORATION Right 02/28/2019   Procedure: LAPAROSCOPIC EXPLORATION WITH REMOVAL OF RIGHT PREPERITONEAL MESH;  Surgeon: Candyce Champagne, MD;  Location: [facility];  Service: General;  Laterality: Right;   LAPAROSCOPIC LYSIS OF ADHESIONS Bilateral 02/28/2019   Procedure: LAPAROSCOPIC LYSIS OF ADHESIONS;  Surgeon: Candyce Champagne, MD;  Location: [facility];  Service: General;  Laterality: Bilateral;   LAPAROSCOPIC UNILATERAL SALPINGOOPHORECTOMY  1984   LAPAROSCOPY N/A 01/24/2021   Procedure: LAPAROSCOPY DIAGNOSTIC;  Surgeon: Candyce Champagne, MD;  Location: [facility];  Service: General;  Laterality: N/A;   LESION REMOVAL Right 02/28/2019   Procedure: EXCISION  VAGINAL ABSCESS  AND DISSECTION;  Surgeon: Candyce Champagne, MD;  Location: [facility];  Service: General;  Laterality: Right;   MASS EXCISION Right 06/16/2018   Procedure: REMOVAL OF RIGHT GROIN SUBCUTANEOUS MASS ERAS PATHWAY;  Surgeon: Candyce Champagne, MD;  Location: [facility];  Service: General;  Laterality: Right;   removal of lymph node cyst in right groin      ROTATOR CUFF REPAIR Right 2003   and BONE SPUR   VENTRAL HERNIA REPAIR N/A 01/24/2021   Procedure: LYSIS OF ADHESIONS, RIGHT INGUINAL AND INCISIONAL HERNIA REPAIR WITH MESH.  TAP BLOCK;  Surgeon: Candyce Champagne, MD;  Location: [facility];  Service: General;  Laterality: N/A;   Patient Active Problem List   Diagnosis Date Noted   Overactive bladder 09/22/2023   SUI (stress urinary incontinence, female) 09/22/2023   Vaginal atrophy 09/22/2023   Ear pain, right 03/19/2023   Chronic rhinitis 04/14/2022   COVID-19 long hauler 01/16/2022   COVID-19 virus infection 11/20/2021   CAP (community acquired pneumonia) 11/20/2021   COPD GOLD 1 10/16/2021   Abdominal pain 06/06/2019   Seroma, post-traumatic (HCC) 04/27/2019   Medication monitoring encounter 03/31/2019   Abscess of groin, right 02/28/2019   Infected hernioplasty mesh (HCC) 01/26/2019   Dizziness 01/17/2019   DOE (dyspnea on exertion) 01/17/2019   Mycobacterium fortuitum infection    Thigh abscess    Aortic aneurysm (HCC) 12/08/2018   Soft tissue infection - Mycobaterium fortuitum   12/08/2018   Cellulitis of right groin 12/08/2018   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   Groin abscess 06/16/2018   Anxiety    Urgency of urination    DDD (degenerative disc disease), lumbar    Osteopenia 08/04/2017   Right sided sciatica 02/18/2017   Piriformis muscle pain 02/18/2017   Right groin pain 11/04/2016   Neuropathy 11/04/2016   Numbness 11/04/2016   Depression with anxiety 11/04/2016   Dysfunction of right eustachian tube 10/30/2015   Presbycusis of both ears 10/30/2015   Sensory  hearing loss, bilateral 10/30/2015   Subjective tinnitus of both ears 10/30/2015   Atypical chest pain 05/08/2014    PCP: Olin Bertin, MD  REFERRING PROVIDER: Woodrow Hazy, MD   REFERRING DIAG: N81.10 (ICD-10-CM) - Cystocele, unspecified  THERAPY DIAG:  Abnormal posture  Muscle weakness (generalized)  Other muscle spasm  Pelvic pain  Rationale for Evaluation and Treatment: Rehabilitation  ONSET DATE: 2 years ago  SUBJECTIVE:  SUBJECTIVE STATEMENT: Pt states that she feels like she has her exercises down. She is feeling prepared to discharge today. She would like any more exercises that might feel beneficial. She still has some burning, but the coconut oil continues to be helpful.   PAIN: 12/08/23 Are you having pain? Yes NPRS scale: 0/10 Pain location: Vaginal and abdomen pain  Pain type: burning Pain description: constant   Aggravating factors: after urination Relieving factors: nothing helps   PRECAUTIONS: Other: fall risk, osteopenia   RED FLAGS: None   WEIGHT BEARING RESTRICTIONS: No  FALLS:  Has patient fallen in last 6 months? No  LIVING ENVIRONMENT: Lives with: lives with their family Lives in: House/apartment  OCCUPATION: retired Chiropractor  PLOF: Independent  PATIENT GOALS: decrease pain, strengthen core   PERTINENT HISTORY:  Osteopenia, G1P1, hysterectomy on her 30s, slow transit constipation, hemorrhoids, 7 surgeries in Rt groin (inguinal and femoral hernia repairs, various cysts, infection), ventral hernia  BOWEL MOVEMENT: Pain with bowel movement: No Type of bowel movement:Frequency daily and Strain Yes Fully empty rectum: No Leakage: No Pads: No Fiber supplement: No  URINATION: Pain with urination: Yes Fully empty bladder:  No Stream:  start and stop Urgency: Yes: doesn't leak Frequency: at least every hour; 1x at 5 nocturia  Leakage: Coughing, Sneezing, and Laughing Pads: No  INTERCOURSE: Not sexually active   PREGNANCY: Vaginal deliveries 1  Tearing No C-section deliveries 0  Currently pregnant No  PROLAPSE: Vaginal pressure    OBJECTIVE:  Note: Objective measures were completed at Evaluation unless otherwise noted. 10/15/23: PFIQ-7: 33 Trigger points present in Rt glutes with tenderness  Improving deep core coordination with breath with better pressure management  08/10/23:  COGNITION: Overall cognitive status: Within functional limits for tasks assessed     SENSATION: Light touch: Appears intact Proprioception: Appears intact  FUNCTIONAL TESTS:  Curl-up test: abdominal distortion, worse in Rt lower quadrant  GAIT: Comments: WNL  POSTURE: rounded shoulders, forward head, and increased thoracic kyphosis   PALPATION:   General  Tenderness over bladder with urgency, tenderness in Rt lower quadrant                External Perineal Exam dryness, phimosis, labial fusion                             Internal Pelvic Floor adhesions and stricture, low tone, discomfort with palpation  Patient confirms identification and approves PT to assess internal pelvic floor and treatment Yes  PELVIC MMT:   MMT eval  Vaginal 2/5, 2 second endurance, 7 repeat contractions, decreased coordination and co-contraction of glutes   Diastasis Recti Notable abdominal bulge at midline and in Rt lower quadrant  (Blank rows = not tested)        TONE: low  PROLAPSE: Grade 1 anterior vaginal wall laxity in supine mid-day  TODAY'S TREATMENT:  DATE:  12/08/23 Manual: Trigger Point Dry Needling  Initial Treatment: Pt instructed on Dry Needling rational, procedures, and possible side  effects. Pt instructed to expect mild to moderate muscle soreness later in the day and/or into the next day.  Pt instructed in methods to reduce muscle soreness. Pt instructed to continue prescribed HEP. Patient was educated on signs and symptoms of infection and other risk factors and advised to seek medical attention should they occur.  Patient verbalized understanding of these instructions and education.   Patient Verbal Consent Given: Yes Education Handout Provided: Yes Muscles Treated: Rt glutes and Rt QL/lumbar paraspinals Electrical Stimulation Performed: No Treatment Response/Outcome: twitch response/release  Soft tissue mobilization to lumbar paraspinals and Rt glutes Therapeutic activities: Other vaginal moisturizers to try - list given Standing shoulder extensions red band 2 x 10 Sidestepping red band at table for support 5 laps 3 way kick 10x each, bil Squats to table 2 x 10  Standing march 2 x 10   10/29/23 Manual: Trigger Point Dry Needling  Initial Treatment: Pt instructed on Dry Needling rational, procedures, and possible side effects. Pt instructed to expect mild to moderate muscle soreness later in the day and/or into the next day.  Pt instructed in methods to reduce muscle soreness. Pt instructed to continue prescribed HEP. Patient was educated on signs and symptoms of infection and other risk factors and advised to seek medical attention should they occur.  Patient verbalized understanding of these instructions and education.   Patient Verbal Consent Given: Yes Education Handout Provided: Yes Muscles Treated: Rt glutes and Rt QL/lumbar paraspinals Electrical Stimulation Performed: No Treatment Response/Outcome: twitch response/release Abdominal scar tissue mobilization  Soft tissue mobilization to Rt lower quadrant Soft tissue mobilization to lumbar paraspinals and Rt glutes Exercises: Lower trunk rotation 2 x 10 Single knee to chest 5x bil Seated  piriformis stretch 2 x 60 seconds bil Therapeutic activities: Sidestepping red band at table for support 5 laps 3 way kick 10x each, bil Squats to table 2 x 10    10/15/23 Manual: Trigger Point Dry Needling  Initial Treatment: Pt instructed on Dry Needling rational, procedures, and possible side effects. Pt instructed to expect mild to moderate muscle soreness later in the day and/or into the next day.  Pt instructed in methods to reduce muscle soreness. Pt instructed to continue prescribed HEP. Patient was educated on signs and symptoms of infection and other risk factors and advised to seek medical attention should they occur.  Patient verbalized understanding of these instructions and education.   Patient Verbal Consent Given: Yes Education Handout Provided: Yes Muscles Treated: Rt glutes and Rt QL/lumbar paraspinals Electrical Stimulation Performed: No Treatment Response/Outcome: twitch response/release Abdominal scar tissue mobilization  Soft tissue mobilization to Rt lower quadrant Soft tissue mobilization to lumbar paraspinals and Rt glutes Neuromuscular re-education: Standing shoulder extensions red band 2 x 10 Standing shoulder extensions with march 2 x 10 red band  Pallof press 2 x 10 bil red band  Side lying clam shell 2 x 10    PATIENT EDUCATION:  Education details: See above Person educated: Patient Education method: Explanation, Demonstration, Tactile cues, Verbal cues, and Handouts Education comprehension: verbalized understanding  HOME EXERCISE PROGRAM: [id number]  ASSESSMENT:  CLINICAL IMPRESSION: Pt is doing very well. She states that she is feeling much better than when first coming to PT. She feels like she is not constantly thinking about her bladder when she leaves the house. Due to progress, she does feel prepared to  discharge therapy today. We discussed trying other vaginal moisturizers to see if she can help to resolve residual vaginal burning -  list was given of recommended products. We performed dry needling to Rt hip one more time today to reduce trigger points and reviewed HEP and made sure it was updated with standing activities. Due to having met most rehab goals and all personal goals for coming to therapy, she is prepared to D/C at this time. She was encouraged to call with any questions or concerns.   OBJECTIVE IMPAIRMENTS: decreased activity tolerance, decreased coordination, decreased endurance, decreased strength, increased fascial restrictions, increased muscle spasms, impaired tone, postural dysfunction, and pain.   ACTIVITY LIMITATIONS: continence  PARTICIPATION LIMITATIONS: community activity  PERSONAL FACTORS: 1 comorbidity: medical history  are also affecting patient's functional outcome.   REHAB POTENTIAL: Good  CLINICAL DECISION MAKING: Stable/uncomplicated  EVALUATION COMPLEXITY: Low   GOALS: Goals reviewed with patient? Yes  SHORT TERM GOALS: Updated 12/08/23  Pt will be independent with HEP.   Baseline: Goal status: MET 10/15/23  2.  Pt will be independent with the knack, urge suppression technique, and double voiding in order to improve bladder habits and decrease urinary incontinence.   Baseline:  Goal status: MET 10/15/23  3.  Pt will be independent with use of squatty potty, relaxed toileting mechanics, and improved bowel movement techniques in order to increase ease of bowel movements and complete evacuation.   Baseline:  Goal status: MET 10/15/23  4.  Pt will report improvement in pelvic/vaginal pain to no greater than 2/10.  Baseline:  Goal status: IN PROGRESS 12/08/23  5.  Pt will be independent with vulvovaginal massage and perform on daily basis to help improve pain Baseline:  Goal status: MET 10/15/23  6.  Pt will increase pelvic floor muscle strength to 3/5.  Baseline:  Goal status: IN PROGRESS 12/08/23  LONG TERM GOALS: Updated 12/08/23  Pt will be independent with advanced HEP.    Baseline:  Goal status: MET 12/08/23  2.  Pt will increase pelvic floor muscle strength to 4/5.  Baseline:  Goal status: DISCHARGED 12/08/23  3.  Pt will increase pelvic floor muscle endurance to more than 10 seconds.  Baseline:  Goal status: DISCHARGED 12/08/23  4.  Pt will report 0/10 vaginal/pelvic pain.  Baseline:  Goal status: DISCHARGED 12/08/23  5.  Pt will be able to go 2-3 hours in between voids without urgency or incontinence in order to improve QOL and perform all functional activities with less difficulty.   Baseline:  Goal status:MET 12/08/23  6.  Pt will be able to perform curl-up test without any abdominal distortion.  Baseline:  Goal status: DISCHARGED 12/08/23  PLAN:  PT FREQUENCY:-  PT DURATION: -  PLANNED INTERVENTIONS: -  PLAN FOR NEXT SESSION: -  PHYSICAL THERAPY DISCHARGE SUMMARY  Visits from Start of Care: 7  Current functional level related to goals / functional outcomes: Independent   Remaining deficits: See above   Education / Equipment: HEP   Patient agrees to discharge. Patient goals were partially met. Patient is being discharged due to being pleased with the current functional level.  Verlena Glenn, PT, DPT05/06/253:31 PM

## 2023-12-08 NOTE — Patient Instructions (Signed)

## 2023-12-08 NOTE — Telephone Encounter (Signed)
 Dr Washington Hacker addressed this for me you can disregard thanks

## 2023-12-08 NOTE — Progress Notes (Signed)
 Reviewed Chest CT with radiology comment regarding complete collapse of RML airway. Videocalled patient and reviewed results. Her current symptoms are mild and somewhat improved after her prednisone  taper. We discussed need for antibiotics but I do not suspect significant respiratory infection at this time in absence of B symptoms. We discussed utility of bronchoscopy for this area which could be a combination of mucous plugging and scarring. Low suspicion for endobronchial lesion but reasonable to obtain flexible bronchoscopy with BAL in the future if she is still having persistent symptoms at her next visit on 01/01/24 with NP Cobb. Will forward note to NP Cobb for FYI.

## 2023-12-08 NOTE — Telephone Encounter (Signed)
 Copied from CRM [id number]. Topic: Clinical - Lab/Test Results >> Dec 07, 2023  4:04 PM Catherine [name] wrote: Reason for CRM: patient would like someone to call her to provide further instructions, she see her ct scan results.

## 2023-12-11 DIAGNOSIS — E785 Hyperlipidemia, unspecified: Secondary | ICD-10-CM | POA: Diagnosis not present

## 2023-12-14 DIAGNOSIS — M810 Age-related osteoporosis without current pathological fracture: Secondary | ICD-10-CM | POA: Diagnosis not present

## 2023-12-15 ENCOUNTER — Encounter (HOSPITAL_BASED_OUTPATIENT_CLINIC_OR_DEPARTMENT_OTHER)

## 2023-12-17 DIAGNOSIS — J449 Chronic obstructive pulmonary disease, unspecified: Secondary | ICD-10-CM | POA: Diagnosis not present

## 2023-12-17 DIAGNOSIS — F331 Major depressive disorder, recurrent, moderate: Secondary | ICD-10-CM | POA: Diagnosis not present

## 2023-12-17 DIAGNOSIS — G894 Chronic pain syndrome: Secondary | ICD-10-CM | POA: Diagnosis not present

## 2023-12-22 ENCOUNTER — Encounter

## 2023-12-23 ENCOUNTER — Encounter: Payer: Self-pay | Admitting: Obstetrics and Gynecology

## 2023-12-23 ENCOUNTER — Ambulatory Visit: Admitting: Obstetrics and Gynecology

## 2023-12-23 VITALS — BP 107/73 | HR 87

## 2023-12-23 DIAGNOSIS — N3281 Overactive bladder: Secondary | ICD-10-CM | POA: Diagnosis not present

## 2023-12-23 DIAGNOSIS — N393 Stress incontinence (female) (male): Secondary | ICD-10-CM | POA: Diagnosis not present

## 2023-12-23 DIAGNOSIS — R102 Pelvic and perineal pain: Secondary | ICD-10-CM

## 2023-12-23 MED ORDER — NONFORMULARY OR COMPOUNDED ITEM: 5 refills | Status: AC

## 2023-12-23 NOTE — Progress Notes (Signed)
 [facility] Return Visit  SUBJECTIVE  History of Present Illness: Catherine [name] is a 78 y.o. female seen in follow-up for vaginal burning and OAB. Plan at last visit was start Trospium  20mg  daily. Patient reports she did not start  the trospium  as she was scared of associated constipation as she is doing well on Linzess.   Patient reports that despite daily coconut oil and twice weekly estrogen cream use, she is still suffering from significant vaginal burning. She has done Tawni Fat therapy in the past and reports nothing has helped.     Past Medical History: Patient  has a past medical history of Abdominal pain (06/06/2019), Anxiety, Aortic aneurysm (HCC) (12/08/2018), Arthritis, BCC (basal cell carcinoma) (04/07/1995), BCC (basal cell carcinoma) (05/19/2013), Chronic constipation, Complication of anesthesia, COPD (chronic obstructive pulmonary disease) (HCC), DDD (degenerative disc disease), lumbar, Dizziness (01/17/2019), DOE (dyspnea on exertion) (01/17/2019), Dysplastic nevus (12/04/2009), Dysuria, Frequency of urination, Ganglion cyst of right groin (11/23/2018), GERD (gastroesophageal reflux disease), Hearing aid worn, Infected hernioplasty mesh (HCC) (01/26/2019), Keratosis, actinic (02/18/1991), Migraine, Mild emphysema (HCC), Mycobacterial lymphadenitis (11/23/2018), Osteopenia (2019), Pelvic pain in female, Seroma, post-traumatic (HCC) (04/27/2019), Smokers' cough (HCC), Thoracic aortic aneurysm (TAA) (HCC), Urgency of urination, and Wears glasses.   Past Surgical History: She  has a past surgical history that includes Cholecystectomy open (1987); LAPAROSCOPIC UNILATERAL SALPINGOOPHORECTOMY (1984); Colonoscopy (2002  &  12-2012 & 03/19/15); Abdominal hysterectomy (1982); cysto with hydrodistension (N/A, 02/20/2014); Rotator cuff repair (Right, 2003); Eye surgery; removal of lymph node cyst in right groin ; bilateral hernia surgery ; Mass excision (Right, 06/16/2018); Excision  mass lower extremeties (Right, 11/02/2018); Incision and drainage abscess (N/A, 12/09/2018); Laparoscopic abdominal exploration (Right, 02/28/2019); Groin dissection (N/A, 02/28/2019); Laparoscopic lysis of adhesions (Bilateral, 02/28/2019); Lesion removal (Right, 02/28/2019); IR Radiologist Eval & Mgmt (05/03/2019); IR Catheter Tube Change (05/05/2019); IR Radiologist Eval & Mgmt (05/10/2019); Hernia repair; Knee arthroscopy w/ meniscal repair (Right, 2021); laparoscopy (N/A, 01/24/2021); Ventral hernia repair (N/A, 01/24/2021); and Breast biopsy (Right).   Medications: She has a current medication list which includes the following prescription(s): acetaminophen , azelastine hcl, breztri  aerosphere, calcium  carbonate antacid, carboxymethylcellulose, cholecalciferol , cyanocobalamin , docusate sodium , esomeprazole, fexofenadine, trelegy ellipta , gabapentin , levalbuterol , linzess, lorazepam , NONFORMULARY OR COMPOUNDED ITEM, polyethylene glycol, senna, sodium chloride -sodium bicarb, sumatriptan , tramadol , zoledronic  acid, albuterol , and trospium .   Allergies: Patient is allergic to ciprofloxacin  hcl, metoprolol, penicillins, oxycodone , advair diskus [fluticasone -salmeterol], and fosamax [alendronate].   Social History: Patient  reports that she quit smoking about 6 years ago. Her smoking use included cigarettes. She started smoking about 54 years ago. She has a 36 pack-year smoking history. She has never used smokeless tobacco. She reports that she does not drink alcohol  and does not use drugs.     OBJECTIVE     Physical Exam: Vitals:   12/23/23 1319  BP: 107/73  Pulse: 87   Gen: No apparent distress, A&O x 3.  Detailed Urogynecologic Evaluation:  Deferred.    ASSESSMENT AND PLAN    Catherine [name] is a 78 y.o. with:  1. Overactive bladder   2. SUI (stress urinary incontinence, female)   3. Vaginal pain    Patient reports that overall her OAB has improved with pelvic floor PT. She did 8  sessions and has seen improvement. We discussed if she wanted a more natural option she could also consider adding in pumpkin seed extract up to 5gm daily.  Patient's SUI is not as bothersome at this time.  Patient has significant vaginal pain and  burning. She reports the pain is worse after urination but it feels deeper in the vagina. Question if the pain is vaginal vs. Bladder. Will try compounded cream Amitriptyline 2.5%/ gabapentin  2.5%/ baclofen 2.5% in vaginal cream to see if this is helpful for patient. If this is not helpful will consider trying bladder installation to see if it more in the bladder area that this pain is coming from.   Patient to let me know in one week how her pain is with the compounded cream and we can consider whether or not to try a bladder installation from there or follow up in a few months.    Mallie Giambra G Aine Strycharz, NP

## 2023-12-23 NOTE — Patient Instructions (Signed)
 Pumpkin seed extract up to 5gm (5000mg ) daily can be really helpful to assist in overactive bladder

## 2023-12-24 ENCOUNTER — Ambulatory Visit
Admission: RE | Admit: 2023-12-24 | Discharge: 2023-12-24 | Disposition: A | Discharge status: Home / Self Care | Payer: PPO | Source: Ambulatory Visit | Attending: Family Medicine | Admitting: Family Medicine

## 2023-12-24 ENCOUNTER — Encounter: Payer: Self-pay | Admitting: Obstetrics and Gynecology

## 2023-12-24 DIAGNOSIS — M810 Age-related osteoporosis without current pathological fracture: Secondary | ICD-10-CM

## 2023-12-24 DIAGNOSIS — M8588 Other specified disorders of bone density and structure, other site: Secondary | ICD-10-CM | POA: Diagnosis not present

## 2023-12-24 DIAGNOSIS — N958 Other specified menopausal and perimenopausal disorders: Secondary | ICD-10-CM | POA: Diagnosis not present

## 2024-01-01 ENCOUNTER — Ambulatory Visit (HOSPITAL_BASED_OUTPATIENT_CLINIC_OR_DEPARTMENT_OTHER): Admitting: Nurse Practitioner

## 2024-01-01 ENCOUNTER — Ambulatory Visit (HOSPITAL_BASED_OUTPATIENT_CLINIC_OR_DEPARTMENT_OTHER): Admitting: Pulmonary Disease

## 2024-01-01 ENCOUNTER — Encounter (HOSPITAL_BASED_OUTPATIENT_CLINIC_OR_DEPARTMENT_OTHER): Payer: Self-pay | Admitting: Nurse Practitioner

## 2024-01-01 VITALS — BP 122/84 | HR 82 | Ht 69.0 in | Wt 156.2 lb

## 2024-01-01 DIAGNOSIS — J449 Chronic obstructive pulmonary disease, unspecified: Secondary | ICD-10-CM | POA: Diagnosis not present

## 2024-01-01 DIAGNOSIS — R0609 Other forms of dyspnea: Secondary | ICD-10-CM | POA: Diagnosis not present

## 2024-01-01 DIAGNOSIS — R9389 Abnormal findings on diagnostic imaging of other specified body structures: Secondary | ICD-10-CM

## 2024-01-01 DIAGNOSIS — J9819 Other pulmonary collapse: Secondary | ICD-10-CM

## 2024-01-01 NOTE — Progress Notes (Signed)
 Full pft performed today.

## 2024-01-01 NOTE — Patient Instructions (Signed)
 Continue Trelegy 1 puff daily. Brush tongue and rinse mouth afterwards Continue levalbuterol  inhaler 2 puffs or 3 mL neb every 6 hours as needed for shortness of breath or wheezing. Notify if symptoms persist despite rescue inhaler/neb use.   Use guaifenesin  600 mg Twice daily for cough/congestion  Repeat CT chest in 2 months since you are better now. I think this is likely just mucus that was stuck in your airways and some scarring. We did discuss the possibility of a hidden lung cancer as well and discussed option of a bronchoscopy; however, since you are better, plan to repeat in 2 months and then decide from there  Follow up beginning of August with Dr. Villa Greaser (30 min slot; Washington Hacker pt) or Katie Destyni Hoppel,NP to review CT. If symptoms do not improve or worsen, please contact office for sooner follow up or seek emergency care.

## 2024-01-01 NOTE — Patient Instructions (Signed)
 Full pft performed today.

## 2024-01-01 NOTE — Assessment & Plan Note (Signed)
 Progressive collapse in RML. At the time, she had infectious symptoms that were slow to resolve. Favors more of an infectious/inflammatory process with mucus plugging and possible scarring. Low suspicion for neoplasm; however, we did discuss that this was not entirely ruled out. Reviewed potential options for workup including bronchoscopy or short term follow up CT. Given she is clinically improved, shared decision to repeat CT chest at 3 month mark. If there has been further progression or failure to improve, could consider BAL at that time. Encouraged her to work on mucociliary clearance therapies in interim and continue bronchodilator regimen.  Patient Instructions  Continue Trelegy 1 puff daily. Brush tongue and rinse mouth afterwards Continue levalbuterol  inhaler 2 puffs or 3 mL neb every 6 hours as needed for shortness of breath or wheezing. Notify if symptoms persist despite rescue inhaler/neb use.   Use guaifenesin  600 mg Twice daily for cough/congestion  Repeat CT chest in 2 months since you are better now. I think this is likely just mucus that was stuck in your airways and some scarring. We did discuss the possibility of a hidden lung cancer as well and discussed option of a bronchoscopy; however, since you are better, plan to repeat in 2 months and then decide from there  Follow up beginning of August with Catherine [name] (30 min slot; Washington Hacker pt) or Catherine Samaiyah Howes,NP to review CT. If symptoms do not improve or worsen, please contact office for sooner follow up or seek emergency care.

## 2024-01-01 NOTE — Progress Notes (Signed)
 @Patient  ID: Catherine Catherine [name], female    DOB: 07-19-1946, 78 y.o.   MRN: [id number]  Chief Complaint  Patient presents with   Follow-up    COPD    Referring provider: Olin Bertin, MD  HPI: 78 year old female, former smoker followed for COPD.  She is a patient Dr. Jacqualyn [name] and last seen in office on 11/27/2023 by Dr. Washington [name].  Past medical history significant for aortic aneurysm without rupture, neuropathy, DDD, osteopenia, depression with anxiety, migraine headaches.  TEST/EVENTS:  09/05/2020 CT chest without contrast: There is atherosclerosis.  There is no LAD present.  Mild centrilobular emphysema.  There is a solid 3 mm left lower lobe pulmonary nodule, considered benign and stable.  There is no acute pulmonary process noted.  Nonaneurysmal thoracic aorta.  Maximal ascending thoracic aorta measuring 3.9 cm. 11/15/2021 CXR: Unable to view image.  Reviewed read which showed stigmata of emphysema, with increased retrosternal airspace, flattened hemidiaphragms and increased AP diameter.  Lungs are both hyperinflated.  There were coarsened interstitial markings, with no confluent airspace disease noted.  There is no evidence of acute cardiopulmonary disease. 11/30/2023 CT chest without contrast: Atherosclerosis.  No LAD.  Complete collapse of the right middle lobe which Catherine [name] progressed compared to prior exam.  Complete opacification of the right middle lobe bronchus.  Mild emphysema.  2 mm left lower lobe nodule stable 01/01/2024 PFT: FVC 101, FEV1 85, ratio 67, TLC 124, DLCO 100. No BD. Mild obstruction without reversibility and normal diffusion capacity   11/27/2023: OV with Dr. Washington [name].  Seen by Dr. Waymond [name] for an acute visit for COPD exacerbation after being seen in urgent care and treated with prednisone  and Levaquin.  Treated with Breztri  sample, Depo shot, Mucinex  and PPI.  Compared to her Trelegy, Breztri  does seem to help but Catherine [name] caused thrush in the past.  Feeling very fatigued.  Less active.   Having more production.  Having to use lev albuterol  twice a day.  Advised to resume Trelegy after completing Breztri .  Prednisone  taper.  CT chest ordered.  Schedule pulmonary function testing.  01/01/2024: Today-follow-up Patient presents today for follow-up.  After her last visit, she had a CT scan that revealed progressive right middle lobe collapse.  She reviewed this with Dr. Washington [name] who felt it was likely more related to mucous plugging and scarring with low suspicion for neoplasm.  Suggested that his symptoms did not improve, bronchoscopy with BAL may be indicated.  She had PFT today, which were stable and showed very mild obstruction in her airflow.  No diffusion defect. She tells me she is feeling better.  Seems to be back to her normal.  Not really having any issues with cough.  Will occasionally get a little bit of chest congestion and mucus production but is much improved compared to before.  Able to go back to her normal activities.  Not really having to use her rescue inhaler.  Back on her Trelegy.  No hemoptysis, weight loss, anorexia, fevers, chills, night sweats.  Allergies  Allergen Reactions   Ciprofloxacin  Hcl Other (See Comments)    Aortic aneurysm per MD   Metoprolol Other (See Comments) and Cough    and wheezing   Penicillins Shortness Of Breath, Rash and Other (See Comments)    Catherine [name] patient had a PCN reaction causing immediate rash, facial/tongue/throat swelling, SOB or lightheadedness with hypotension: Yes Catherine [name] patient had a PCN reaction causing severe rash involving mucus membranes or skin necrosis: No Catherine [name] patient had a PCN reaction  that required hospitalization: Yes - MD office Catherine [name] patient had a PCN reaction occurring within the last 10 years: No If all of the above answers are "NO", then may proceed with Cephalosporin use.    Oxycodone  Other (See Comments)    Nightmares.  Tolerates tramadol    Advair Diskus [Fluticasone -Salmeterol] Anxiety   Fosamax [Alendronate] Other  (See Comments)    Other reaction(s): GERD    Immunization History  Administered Date(s) Administered   DTaP 05/09/2006   Fluad Quad(high Dose 65+) 05/05/2019   H1N1 05/26/2008   Influenza Split 05/20/2008, 05/29/2009, 04/24/2010, 05/13/2011, 04/20/2012, 05/13/2013   Influenza, High Dose Seasonal PF 05/14/2021   Influenza,inj,Quad PF,6+ Mos 04/15/2016, 03/27/2017, 05/06/2018, 05/10/2019, 05/08/2020   Influenza,inj,quad, With Preservative 04/27/2014, 04/27/2015   Influenza-Unspecified 04/04/2014, 05/06/2018   Moderna Covid-19 Vaccine Bivalent Booster 45yrs & up 05/20/2021   Moderna SARS-COV2 Booster Vaccination 06/06/2020, 12/04/2020   Moderna Sars-Covid-2 Vaccination 09/02/2019, 09/30/2019, 06/05/2020, 12/03/2020   PFIZER Comirnaty(Gray Top)Covid-19 Tri-Sucrose Vaccine 05/22/2023   PNEUMOCOCCAL CONJUGATE-20 09/19/2022   Pneumococcal Conjugate-13 04/04/2014   Pneumococcal Polysaccharide-23 06/06/2009   Pneumococcal-Unspecified 06/06/2009, 04/04/2014   Td 09/10/2018   Tdap 09/10/2018   Zoster, Live 07/11/2009, 03/12/2021, 08/13/2021    Past Medical History:  Diagnosis Date   Abdominal pain 06/06/2019   Anxiety    Aortic aneurysm (HCC) 12/08/2018   Arthritis    HANDS   BCC (basal cell carcinoma) 04/07/1995   right jawline (CX3, 5FU)   BCC (basal cell carcinoma) 05/19/2013   right scalp/post (treatment biopsy)   Chronic constipation    Complication of anesthesia    upon waking up from anesthesia patient experienced chest pain - EKG done was negative, patient states "it may have been indigestion"   COPD (chronic obstructive pulmonary disease) (HCC)    DDD (degenerative disc disease), lumbar    L2-3/3-4/4-5   Dizziness 01/17/2019   DOE (dyspnea on exertion) 01/17/2019   Dysplastic nevus 12/04/2009   mod-severe left forearm, exc   Dysuria    Frequency of urination    Ganglion cyst of right groin 11/23/2018   GERD (gastroesophageal reflux disease)    Hearing aid worn     Infected hernioplasty mesh (HCC) 01/26/2019   Keratosis, actinic 02/18/1991   left abdomen   Migraine    Mild emphysema (HCC)    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   Osteopenia 2019   TO OSTEOPOROSIS    Pelvic pain in female    Seroma, post-traumatic (HCC) 04/27/2019   Smokers' cough (HCC)    Thoracic aortic aneurysm (TAA) (HCC)    FOLLOWED BY DR Sherene Dilling  LAST CT CHEST  WAS 6-3 SEE EPIC    Urgency of urination    Wears glasses     Tobacco History: Social History   Tobacco Use  Smoking Status Former   Current packs/day: 0.00   Average packs/day: 0.8 packs/day for 48.0 years (36.0 ttl pk-yrs)   Types: Cigarettes   Start date: 09/04/1969   Quit date: 09/04/2017   Years since quitting: 6.3  Smokeless Tobacco Never   Counseling given: Not Answered   Outpatient Medications Prior to Visit  Medication Sig Dispense Refill   acetaminophen  (TYLENOL ) 500 MG tablet Take 1,000 mg by mouth at bedtime.     Azelastine HCl (ASTEPRO NA) Place 2 puffs into the nose daily.     CALCIUM  CARBONATE ANTACID PO Take 1,000 mg by mouth daily.      carboxymethylcellulose (REFRESH PLUS) 0.5 % SOLN Place 2 drops into both eyes 3 (three) times daily.  Cholecalciferol  (VITAMIN D3 PO) Take 6,000 Units by mouth daily.     Cyanocobalamin  (VITAMIN B-12 PO) Take 1,200 mcg by mouth daily.     docusate sodium  (COLACE) 100 MG capsule Take 200 mg by mouth daily.      esomeprazole (NEXIUM) 20 MG capsule Take 20 mg by mouth daily.     fexofenadine (ALLEGRA ODT) 30 MG disintegrating tablet Take 60 mg by mouth daily.     Fluticasone -Umeclidin-Vilant (TRELEGY ELLIPTA ) 200-62.5-25 MCG/ACT AEPB Inhale 1 puff into the lungs daily. 1 each 11   gabapentin  (NEURONTIN ) 100 MG capsule Take 100 mg by mouth daily.     levalbuterol  (XOPENEX  HFA) 45 MCG/ACT inhaler Inhale 2 puffs into the lungs every 4 (four) hours as needed for wheezing. 1 each 2   LINZESS 72 MCG capsule      LORazepam  (ATIVAN ) 1 MG tablet Take 0.5-1 mg by  mouth See admin instructions. Take 0.5 mg in the morning and 1.0 mg at bedtime     NONFORMULARY OR COMPOUNDED ITEM Amitriptyline 2.5%/ gabapentin  2.5%/ baclofen 2.5% in vaginal cream.  Place 1g daily in vaginal/ vulvar area.  Dispense 60g with 5 refills. 60 each 5   polyethylene glycol (MIRALAX  / GLYCOLAX ) packet Take 17 g by mouth daily.      senna (SENOKOT) 8.6 MG TABS tablet Take 2 tablets by mouth at bedtime.     Sodium Chloride -Sodium Bicarb (AYR SALINE NASAL RINSE NA) Place into the nose.     SUMAtriptan  (IMITREX ) 50 MG tablet Take 50 mg by mouth every 2 (two) hours as needed for migraine. May repeat in 2 hours if headache persists or recurs.     traMADol  (ULTRAM ) 50 MG tablet Take 50 mg by mouth every 6 (six) hours as needed for moderate pain.     zoledronic  acid (RECLAST ) 5 MG/100ML SOLN injection Inject 5 mg into the vein.     budeson-glycopyrrolate-formoterol  (BREZTRI  AEROSPHERE) 160-9-4.8 MCG/ACT AERO inhaler Inhale 2 puffs into the lungs in the morning and at bedtime.     albuterol  (PROVENTIL ) (2.5 MG/3ML) 0.083% nebulizer solution Take 3 mLs (2.5 mg total) by nebulization every 6 (six) hours as needed for wheezing or shortness of breath. 75 mL 5   No facility-administered medications prior to visit.     Review of Systems:   Constitutional: No weight loss or gain, night sweats, fevers, chills. +fatigue (improved) HEENT: No headaches, difficulty swallowing, tooth/dental problems, or sore throat. No sneezing, itching, ear ache, nasal congestion, or post nasal drip CV:  No chest pain, orthopnea, PND, swelling in lower extremities, anasarca, dizziness, palpitations, syncope Resp: +shortness of breath with exertion (baseline; improved); rare cough (clear). No wheezing.  No hemoptysis. No chest wall deformity GI:  No heartburn, indigestion, abdominal pain, nausea, vomiting, diarrhea, change in bowel habits, loss of appetite, bloody stools.  GU: No dysuria, change in color of urine,  urgency or frequency.  No flank pain, no hematuria  Skin: No rash, lesions, ulcerations MSK:  No joint pain or swelling.   Neuro: No dizziness or lightheadedness.  Psych: No depression or anxiety. Mood stable.     Physical Exam:  BP 122/84   Pulse 82   Ht 5\' 9"  (1.753 m)   Wt 156 lb 3.2 oz (70.9 kg)   SpO2 97%   BMI 23.07 kg/m   GEN: Pleasant, interactive, well-appearing; in no acute distress. HEENT:  Normocephalic and atraumatic. PERRLA. Sclera white. Nasal turbinates pink, moist and patent bilaterally. No rhinorrhea present. Oropharynx pink and  moist, without exudate or edema. No lesions, ulcerations, or postnasal drip.  NECK:  Supple w/ fair ROM. No JVD present. Normal carotid impulses w/o bruits. Thyroid  symmetrical with no goiter or nodules palpated. No lymphadenopathy.   CV: RRR, no m/r/g, no peripheral edema. Pulses intact, +2 bilaterally. No cyanosis, pallor or clubbing. PULMONARY:  Unlabored, regular breathing. Diminished bases bilaterally A&P w/o wheezes/rales/rhonchi. No accessory muscle use. No dullness to percussion. GI: BS present and normoactive. Soft, non-tender to palpation. No organomegaly or masses detected.  MSK: No erythema, warmth or tenderness. Cap refil <2 sec all extrem. No deformities or joint swelling noted.  Neuro: A/Ox3. No focal deficits noted.   Skin: Warm, no lesions or rashe Psych: Normal affect and behavior. Judgement and thought content appropriate.     Lab Results:  CBC    Component Value Date/Time   WBC 7.2 01/13/2023 1217   RBC 4.56 01/13/2023 1217   HGB 13.6 01/13/2023 1217   HCT 40.5 01/13/2023 1217   PLT 230 01/13/2023 1217   MCV 88.8 01/13/2023 1217   MCH 29.8 01/13/2023 1217   MCHC 33.6 01/13/2023 1217   RDW 13.1 01/13/2023 1217   LYMPHSABS 1.4 12/24/2021 1309   MONOABS 0.4 12/24/2021 1309   EOSABS 0.1 12/24/2021 1309   BASOSABS 0.0 12/24/2021 1309    BMET    Component Value Date/Time   NA 136 01/13/2023 1217   K 3.8  01/13/2023 1217   CL 104 01/13/2023 1217   CO2 24 01/13/2023 1217   GLUCOSE 105 (H) 01/13/2023 1217   BUN 12 01/13/2023 1217   CREATININE 0.85 01/13/2023 1217   CREATININE 0.93 10/17/2019 1054   CALCIUM  9.3 01/13/2023 1217   GFRNONAA >60 01/13/2023 1217   GFRNONAA 61 10/17/2019 1054   GFRAA 71 10/17/2019 1054    BNP    Component Value Date/Time   BNP 16.9 01/18/2019 1124     Imaging:  DG BONE DENSITY (DXA) Result Date: 12/25/2023 EXAM: DUAL X-RAY ABSORPTIOMETRY (DXA) FOR BONE MINERAL DENSITY 12/24/2023 1:35 pm CLINICAL DATA:  78 year old Female Postmenopausal. Screening for osteoporosis Patient is or Catherine [name] been on glucocorticoid therapy. Patient is or Catherine [name] been on bone building therapies. TECHNIQUE: An axial (e.g., hips, spine) and/or appendicular (e.g., radius) exam was performed, as appropriate, using GE Secretary/administrator at [employer] [facility]. Images are obtained for bone mineral density measurement and are not obtained for diagnostic purposes. [id number] Exclusions: None. COMPARISON:  03/05/2021 FINDINGS: Scan quality: Good. LUMBAR SPINE (L1-L4): BMD (in g/cm2): 0.988 T-score: -1.6 Z-score: 0.2 Rate of change from previous exam: -3.2% LEFT FEMORAL NECK: BMD (in g/cm2): 0.850 T-score: -1.4 Z-score: 0.7 LEFT TOTAL HIP: BMD (in g/cm2): 0.776 T-score: -1.8 Z-score: 0.1 RIGHT FEMORAL NECK: BMD (in g/cm2): 0.807 T-score: -1.7 Z-score: 0.4 RIGHT TOTAL HIP: BMD (in g/cm2): 0.748 T-score: -2.1 Z-score: -0.2 DUAL-FEMUR TOTAL MEAN: Rate of change from previous exam: No significant rate of change from previous exam. FRAX 10-YEAR PROBABILITY OF FRACTURE: FRAX not reported as the patient is receiving bone building therapy. IMPRESSION: Osteopenia based on BMD. Fracture risk is unknown due to history of bone building therapy. RECOMMENDATIONS: 1. All patients should optimize calcium  and vitamin D  intake. 2. Consider FDA-approved medical therapies in postmenopausal women and  men aged 72 years and older, based on the following: - A hip or vertebral (clinical or morphometric) fracture - T-score less than or equal to -2.5 and secondary causes have been excluded. - Low bone mass (T-score between -  1.0 and -2.5) and a 10-year probability of a hip fracture greater than or equal to 3% or a 10-year probability of a major osteoporosis-related fracture greater than or equal to 20% based on the [place] -adapted WHO algorithm. - Clinician judgment and/or patient preferences may indicate treatment for people with 10-year fracture probabilities above or below these levels 3. Patients with diagnosis of osteoporosis or at high risk for fracture should have regular bone mineral density tests. For patients eligible for Medicare, routine testing is allowed once every 2 years. The testing frequency can be increased to one year for patients who have rapidly progressing disease, those who are receiving or discontinuing medical therapy to restore bone mass, or have additional risk factors. Electronically Signed   By: Dina  Arceo M.D.   On: 12/25/2023 08:19    Administration History     None           No data to display          Lab Results  Component Value Date   NITRICOXIDE 9 11/20/2021        Assessment & Plan:   Right middle lobe syndrome Progressive collapse in RML. At the time, she had infectious symptoms that were slow to resolve. Favors more of an infectious/inflammatory process with mucus plugging and possible scarring. Low suspicion for neoplasm; however, we did discuss that this was not entirely ruled out. Reviewed potential options for workup including bronchoscopy or short term follow up CT. Given she is clinically improved, shared decision to repeat CT chest at 3 month mark. If there Catherine [name] been further progression or failure to improve, could consider BAL at that time. Encouraged her to work on mucociliary clearance therapies in interim and continue bronchodilator  regimen.  Patient Instructions  Continue Trelegy 1 puff daily. Brush tongue and rinse mouth afterwards Continue levalbuterol  inhaler 2 puffs or 3 mL neb every 6 hours as needed for shortness of breath or wheezing. Notify if symptoms persist despite rescue inhaler/neb use.   Use guaifenesin  600 mg Twice daily for cough/congestion  Repeat CT chest in 2 months since you are better now. I think this is likely just mucus that was stuck in your airways and some scarring. We did discuss the possibility of a hidden lung cancer as well and discussed option of a bronchoscopy; however, since you are better, plan to repeat in 2 months and then decide from there  Follow up beginning of August with Dr. Villa Greaser (30 min slot; Catherine Catherine [name] pt) or Katie Mali Eppard,NP to review CT. If symptoms do not improve or worsen, please contact office for sooner follow up or seek emergency care.    COPD GOLD 1 Mild COPD, stable on PFT today. Normal gas exchange. Continue triple therapy regimen with Trelegy. Action plan in place. See above plan.    I spent 35 minutes of dedicated to the care of this patient on the date of this encounter to include pre-visit review of records, face-to-face time with the patient discussing conditions above, post visit ordering of testing, clinical documentation with the electronic health record, making appropriate referrals as documented, and communicating necessary findings to members of the patients care team.  Roetta Clarke, NP 01/01/2024  Pt aware and understands NP's role.

## 2024-01-01 NOTE — Assessment & Plan Note (Signed)
 Mild COPD, stable on PFT today. Normal gas exchange. Continue triple therapy regimen with Trelegy. Action plan in place. See above plan.

## 2024-01-04 LAB — PULMONARY FUNCTION TEST
DL/VA % pred: 100 %
DL/VA: 3.96 ml/min/mmHg/L
DLCO unc % pred: 100 %
DLCO unc: 22.42 ml/min/mmHg
FEF 25-75 Post: 1.27 L/s
FEF 25-75 Pre: 1.1 L/s
FEF2575-%Change-Post: 15 %
FEF2575-%Pred-Post: 70 %
FEF2575-%Pred-Pre: 60 %
FEV1-%Change-Post: 5 %
FEV1-%Pred-Post: 89 %
FEV1-%Pred-Pre: 85 %
FEV1-Post: 2.25 L
FEV1-Pre: 2.14 L
FEV1FVC-%Change-Post: 5 %
FEV1FVC-%Pred-Pre: 85 %
FEV6-%Change-Post: 0 %
FEV6-%Pred-Post: 105 %
FEV6-%Pred-Pre: 105 %
FEV6-Post: 3.35 L
FEV6-Pre: 3.35 L
FEV6FVC-%Change-Post: 1 %
FEV6FVC-%Pred-Post: 105 %
FEV6FVC-%Pred-Pre: 103 %
FVC-%Change-Post: 0 %
FVC-%Pred-Post: 100 %
FVC-%Pred-Pre: 101 %
FVC-Post: 3.35 L
FVC-Pre: 3.38 L
Post FEV1/FVC ratio: 67 %
Post FEV6/FVC ratio: 100 %
Pre FEV1/FVC ratio: 63 %
Pre FEV6/FVC Ratio: 99 %
RV % pred: 152 %
RV: 3.96 L
TLC % pred: 124 %
TLC: 7.24 L

## 2024-01-06 ENCOUNTER — Encounter: Payer: Self-pay | Admitting: Obstetrics and Gynecology

## 2024-01-11 ENCOUNTER — Ambulatory Visit (INDEPENDENT_AMBULATORY_CARE_PROVIDER_SITE_OTHER)

## 2024-01-11 ENCOUNTER — Ambulatory Visit: Admitting: Podiatry

## 2024-01-11 ENCOUNTER — Encounter: Payer: Self-pay | Admitting: Podiatry

## 2024-01-11 DIAGNOSIS — M2041 Other hammer toe(s) (acquired), right foot: Secondary | ICD-10-CM | POA: Diagnosis not present

## 2024-01-11 DIAGNOSIS — R3989 Other symptoms and signs involving the genitourinary system: Secondary | ICD-10-CM

## 2024-01-11 DIAGNOSIS — R350 Frequency of micturition: Secondary | ICD-10-CM | POA: Diagnosis not present

## 2024-01-11 DIAGNOSIS — L84 Corns and callosities: Secondary | ICD-10-CM | POA: Diagnosis not present

## 2024-01-11 NOTE — Progress Notes (Signed)
  Subjective:  Patient ID: Catherine [name], female    DOB: 1945/12/13,   MRN: [id number]  Chief Complaint  Patient presents with   Callouses    Rm23 / painful callous right 5th medial side/2 weeks/aching and sore with pressure    78 y.o. female presents for concern of lesion on right foot that has been present for about two weeks. Denies any current treatments.   . Denies any other pedal complaints. Denies n/v/f/c.   Past Medical History:  Diagnosis Date   Abdominal pain 06/06/2019   Anxiety    Aortic aneurysm (HCC) 12/08/2018   Arthritis    HANDS   BCC (basal cell carcinoma) 04/07/1995   right jawline (CX3, 5FU)   BCC (basal cell carcinoma) 05/19/2013   right scalp/post (treatment biopsy)   Chronic constipation    Complication of anesthesia    upon waking up from anesthesia patient experienced chest pain - EKG done was negative, patient states "it may have been indigestion"   COPD (chronic obstructive pulmonary disease) (HCC)    DDD (degenerative disc disease), lumbar    L2-3/3-4/4-5   Dizziness 01/17/2019   DOE (dyspnea on exertion) 01/17/2019   Dysplastic nevus 12/04/2009   mod-severe left forearm, exc   Dysuria    Frequency of urination    Ganglion cyst of right groin 11/23/2018   GERD (gastroesophageal reflux disease)    Hearing aid worn    Infected hernioplasty mesh (HCC) 01/26/2019   Keratosis, actinic 02/18/1991   left abdomen   Migraine    Mild emphysema (HCC)    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   Osteopenia 2019   TO OSTEOPOROSIS    Pelvic pain in female    Seroma, post-traumatic (HCC) 04/27/2019   Smokers' cough (HCC)    Thoracic aortic aneurysm (TAA) (HCC)    FOLLOWED BY DR Sherene Dilling  LAST CT CHEST  WAS 6-3 SEE EPIC    Urgency of urination    Wears glasses     Objective:  Physical Exam: Vascular: DP/PT pulses 2/4 bilateral. CFT <3 seconds. Normal hair growth on digits. No edema.  Skin. No lacerations or abrasions bilateral feet. Hyperkeratotic  cored lesion noted in fourth interspace on right foot. Medial fifth digit hypkeratosis and tender to palpation  Musculoskeletal: MMT 5/5 bilateral lower extremities in DF, PF, Inversion and Eversion. Deceased ROM in DF of ankle joint. Hammered and adducted fifth digit on right  Neurological: Sensation intact to light touch.   Assessment:   1. Heloma molle   2. Hammertoe of right foot      Plan:  Patient was evaluated and treated and all questions answered. -Educated on hammertoes and heloma molle and treatment options  -Discussed padding including toe caps and crest pads.  -Hyperkeratotic tissue debrided as courtesy today.  -Discussed need for potential surgery if pain does not improved.  -Patient to follow-up as needed. Discussed calling if any changes or increased pain.    Jennefer Moats, DPM

## 2024-01-11 NOTE — Progress Notes (Unsigned)
 Bladder Instillation: The patient was identified and verbally consented for the procedure.  The urethra was prepped with Betadine x 3. A 14 Fr foley catheter was inserted the bladder and drained for  200 cc. The foley was then attached to a 60 mL syringe with the plunger removed.  The medication was slowly poured into the bladder via the syringe and foley.  The medication consisted of: 20ml of Lidocaine  2%, 20mL of Bupivicaine 0.25%, 10,000 units/mL Heparin, 5mL Sodium Bicarbonate 8.4%.  The foley was removed and the patient was asked to hold the liquids in her bladder for 30-60 minutes if possible.   Precautions were given and patient was instructed to call the office or on-call number for any concerns.  Marcelline Sermons, CMA

## 2024-01-12 DIAGNOSIS — R3989 Other symptoms and signs involving the genitourinary system: Secondary | ICD-10-CM | POA: Diagnosis not present

## 2024-01-12 LAB — POCT URINALYSIS DIP (CLINITEK)
Bilirubin, UA: NEGATIVE
Blood, UA: NEGATIVE
Glucose, UA: NEGATIVE mg/dL
Ketones, POC UA: NEGATIVE mg/dL
Leukocytes, UA: NEGATIVE
Nitrite, UA: NEGATIVE
POC PROTEIN,UA: NEGATIVE
Spec Grav, UA: <=1.005 — AB (ref 1.010–1.025)
Urobilinogen, UA: 0.2 U/dL
pH, UA: 6 (ref 5.0–8.0)

## 2024-01-12 MED ORDER — BUPIVACAINE HCL 0.25 % IJ SOLN
20.0000 mL | Freq: Once | INTRAMUSCULAR | Status: AC
Start: 2024-01-12 — End: 2024-01-12
  Administered 2024-01-12: 20 mL

## 2024-01-12 MED ORDER — HEPARIN SODIUM (PORCINE) 10000 UNIT/ML IJ SOLN
10000.0000 [IU] | Freq: Once | INTRAMUSCULAR | Status: AC
  Administered 2024-01-12: 10000 [IU] via INTRAVESICAL

## 2024-01-12 MED ORDER — LIDOCAINE HCL 2 % IJ SOLN
20.0000 mL | Freq: Once | INTRAMUSCULAR | Status: AC
Start: 2024-01-12 — End: 2024-01-12
  Administered 2024-01-12: 400 mg

## 2024-01-12 MED ORDER — SODIUM BICARBONATE 8.4 % IV SOLN
5.0000 mL | Freq: Once | INTRAVENOUS | Status: AC
  Administered 2024-01-12: 5 mL

## 2024-01-18 ENCOUNTER — Ambulatory Visit

## 2024-01-19 MED ORDER — URIBEL 118 MG PO CAPS: 1.0000 | ORAL_CAPSULE | Freq: Four times a day (QID) | ORAL | 2 refills | Status: AC | PRN

## 2024-01-26 ENCOUNTER — Ambulatory Visit

## 2024-02-12 DIAGNOSIS — K219 Gastro-esophageal reflux disease without esophagitis: Secondary | ICD-10-CM | POA: Diagnosis not present

## 2024-02-12 DIAGNOSIS — K5904 Chronic idiopathic constipation: Secondary | ICD-10-CM | POA: Diagnosis not present

## 2024-02-12 DIAGNOSIS — Z860101 Personal history of adenomatous and serrated colon polyps: Secondary | ICD-10-CM | POA: Diagnosis not present

## 2024-02-23 ENCOUNTER — Other Ambulatory Visit: Payer: Self-pay | Admitting: Family Medicine

## 2024-02-23 DIAGNOSIS — Z1231 Encounter for screening mammogram for malignant neoplasm of breast: Secondary | ICD-10-CM

## 2024-02-25 DIAGNOSIS — Z860100 Personal history of colon polyps, unspecified: Secondary | ICD-10-CM | POA: Diagnosis not present

## 2024-02-25 DIAGNOSIS — K5730 Diverticulosis of large intestine without perforation or abscess without bleeding: Secondary | ICD-10-CM | POA: Diagnosis not present

## 2024-02-25 DIAGNOSIS — D125 Benign neoplasm of sigmoid colon: Secondary | ICD-10-CM | POA: Diagnosis not present

## 2024-02-25 DIAGNOSIS — Z09 Encounter for follow-up examination after completed treatment for conditions other than malignant neoplasm: Secondary | ICD-10-CM | POA: Diagnosis not present

## 2024-02-25 DIAGNOSIS — K6389 Other specified diseases of intestine: Secondary | ICD-10-CM | POA: Diagnosis not present

## 2024-02-29 ENCOUNTER — Ambulatory Visit (HOSPITAL_BASED_OUTPATIENT_CLINIC_OR_DEPARTMENT_OTHER)
Admission: RE | Admit: 2024-02-29 | Discharge: 2024-02-29 | Disposition: A | Discharge status: Home / Self Care | Source: Ambulatory Visit | Attending: Nurse Practitioner | Admitting: Nurse Practitioner

## 2024-02-29 DIAGNOSIS — J439 Emphysema, unspecified: Secondary | ICD-10-CM | POA: Diagnosis not present

## 2024-02-29 DIAGNOSIS — R0609 Other forms of dyspnea: Secondary | ICD-10-CM | POA: Diagnosis not present

## 2024-02-29 DIAGNOSIS — R0600 Dyspnea, unspecified: Secondary | ICD-10-CM | POA: Diagnosis not present

## 2024-02-29 DIAGNOSIS — I700 Atherosclerosis of aorta: Secondary | ICD-10-CM | POA: Diagnosis not present

## 2024-02-29 DIAGNOSIS — J9819 Other pulmonary collapse: Secondary | ICD-10-CM | POA: Diagnosis not present

## 2024-02-29 DIAGNOSIS — R918 Other nonspecific abnormal finding of lung field: Secondary | ICD-10-CM | POA: Diagnosis not present

## 2024-03-02 ENCOUNTER — Ambulatory Visit (HOSPITAL_BASED_OUTPATIENT_CLINIC_OR_DEPARTMENT_OTHER)

## 2024-03-02 DIAGNOSIS — D125 Benign neoplasm of sigmoid colon: Secondary | ICD-10-CM | POA: Diagnosis not present

## 2024-03-03 ENCOUNTER — Other Ambulatory Visit: Admitting: Obstetrics and Gynecology

## 2024-03-07 ENCOUNTER — Ambulatory Visit: Payer: Self-pay | Admitting: Nurse Practitioner

## 2024-03-07 NOTE — Progress Notes (Signed)
 CT improved from April and things are stable in her lungs from 2023. She has a stable thoracic aortic aneurysm and emphysema. Can discuss in more detail at follow up. Thanks.

## 2024-03-17 DIAGNOSIS — M9902 Segmental and somatic dysfunction of thoracic region: Secondary | ICD-10-CM | POA: Diagnosis not present

## 2024-03-17 DIAGNOSIS — S29012A Strain of muscle and tendon of back wall of thorax, initial encounter: Secondary | ICD-10-CM | POA: Diagnosis not present

## 2024-03-17 DIAGNOSIS — M9901 Segmental and somatic dysfunction of cervical region: Secondary | ICD-10-CM | POA: Diagnosis not present

## 2024-03-17 DIAGNOSIS — R519 Headache, unspecified: Secondary | ICD-10-CM | POA: Diagnosis not present

## 2024-03-21 DIAGNOSIS — S29012A Strain of muscle and tendon of back wall of thorax, initial encounter: Secondary | ICD-10-CM | POA: Diagnosis not present

## 2024-03-21 DIAGNOSIS — M9901 Segmental and somatic dysfunction of cervical region: Secondary | ICD-10-CM | POA: Diagnosis not present

## 2024-03-21 DIAGNOSIS — R519 Headache, unspecified: Secondary | ICD-10-CM | POA: Diagnosis not present

## 2024-03-21 DIAGNOSIS — M9902 Segmental and somatic dysfunction of thoracic region: Secondary | ICD-10-CM | POA: Diagnosis not present

## 2024-03-23 DIAGNOSIS — J988 Other specified respiratory disorders: Secondary | ICD-10-CM | POA: Diagnosis not present

## 2024-03-23 DIAGNOSIS — H60501 Unspecified acute noninfective otitis externa, right ear: Secondary | ICD-10-CM | POA: Diagnosis not present

## 2024-03-24 DIAGNOSIS — S29012A Strain of muscle and tendon of back wall of thorax, initial encounter: Secondary | ICD-10-CM | POA: Diagnosis not present

## 2024-03-24 DIAGNOSIS — M9901 Segmental and somatic dysfunction of cervical region: Secondary | ICD-10-CM | POA: Diagnosis not present

## 2024-03-24 DIAGNOSIS — M9902 Segmental and somatic dysfunction of thoracic region: Secondary | ICD-10-CM | POA: Diagnosis not present

## 2024-03-24 DIAGNOSIS — R519 Headache, unspecified: Secondary | ICD-10-CM | POA: Diagnosis not present

## 2024-03-28 ENCOUNTER — Ambulatory Visit (HOSPITAL_BASED_OUTPATIENT_CLINIC_OR_DEPARTMENT_OTHER): Admitting: Adult Health

## 2024-03-28 ENCOUNTER — Encounter (HOSPITAL_BASED_OUTPATIENT_CLINIC_OR_DEPARTMENT_OTHER): Payer: Self-pay | Admitting: Adult Health

## 2024-03-28 VITALS — BP 119/65 | HR 79 | Ht 69.0 in | Wt 153.0 lb

## 2024-03-28 DIAGNOSIS — J310 Chronic rhinitis: Secondary | ICD-10-CM | POA: Diagnosis not present

## 2024-03-28 DIAGNOSIS — J449 Chronic obstructive pulmonary disease, unspecified: Secondary | ICD-10-CM | POA: Diagnosis not present

## 2024-03-28 DIAGNOSIS — R918 Other nonspecific abnormal finding of lung field: Secondary | ICD-10-CM

## 2024-03-28 DIAGNOSIS — H6991 Unspecified Eustachian tube disorder, right ear: Secondary | ICD-10-CM | POA: Diagnosis not present

## 2024-03-28 DIAGNOSIS — Z87891 Personal history of nicotine dependence: Secondary | ICD-10-CM

## 2024-03-28 DIAGNOSIS — J9819 Other pulmonary collapse: Secondary | ICD-10-CM

## 2024-03-28 DIAGNOSIS — H9201 Otalgia, right ear: Secondary | ICD-10-CM

## 2024-03-28 MED ORDER — PREDNISONE 20 MG PO TABS: 20.0000 mg | ORAL_TABLET | Freq: Every day | ORAL | 0 refills | Status: AC

## 2024-03-28 MED ORDER — ALBUTEROL SULFATE (2.5 MG/3ML) 0.083% IN NEBU: 2.5000 mg | INHALATION_SOLUTION | Freq: Four times a day (QID) | RESPIRATORY_TRACT | 5 refills | Status: AC | PRN

## 2024-03-28 NOTE — Progress Notes (Signed)
 @Patient  ID: Catherine [name], female    DOB: 09-Nov-1945, 78 y.o.   MRN: [id number]  Chief Complaint  Patient presents with   Follow-up    Referring provider: Kip Righter, MD  HPI: 78 year old female former smoker followed for COPD Medical history significant for aortic aneurysm, neuropathy, osteoarthritis, depression with anxiety COVID-19 infection March 2023 complicated by left lower lobe pneumonia and slow to resolve COPD exacerbation  TEST/EVENTS :  09/05/2020 CT chest without contrast: There is atherosclerosis.  There is no LAD present.  Mild centrilobular emphysema.  There is a solid 3 mm left lower lobe pulmonary nodule, considered benign and stable.  There is no acute pulmonary process noted.  Nonaneurysmal thoracic aorta.  Maximal ascending thoracic aorta measuring 3.9 cm.   12/2021 spirometry that showed mild airflow obstruction with an FEV1 84%, ratio 67 and FVC 95%.  Exhaled nitric oxide  testing was 7 ppb.    Dec 24, 2021 absolute eosinophil count 100   CT chest January 06, 2022 showed resolution of pneumonia, mild linear scarring in the right middle lobe, no suspicious nodules.  Negative for consolidation.  And mild emphysema.  PFTs Jan 01, 2024 mild airflow obstruction (FEV1 89%, ratio 67, FVC 100%, no significant bronchodilator response, DLCO 100%.  CT chest February 29, 2024-no evidence of adenopathy, stable emphysema right middle lobe linear density compatible with atelectasis improved from November 30, 2023 and unchanged from January 06, 2022.  03/28/2024 Follow up: COPD, RML Syndrome  Discussed the use of AI scribe software for clinical note transcription with the patient, who gave verbal consent to proceed.  History of Present Illness Catherine [name] is a 78 year old female with COPD who presents for a 3 month follow-up.  Patient with a slow to resolve COPD exacerbation last visit.  Patient slowly improved with antibiotics and steroids along with increased mucociliary  clearance.  CT chest November 30, 2023 showed complete collapse of the right middle lobe which had progressed from previous imaging.  And a stable 2 mm left lower lobe nodule.  Patient was set up for a serial CT chest completed on February 29, 2024 that showed stable emphysema.  Improved right middle lobe linear density from previous CT scan in April.  Unchanged from June 2023.  Stable 2 mm nodule likely fissural lymph node.  Stable thoracic aortic aneurysm.  We discussed her CT results in detail.  She does complain of some right ear issues over the last couple weeks.  She experienced a recent illness lasting a couple of weeks, with symptoms including a sore throat, swollen glands, and a persistent cough. She is currently halfway through a 10-day course of doxycycline  for an external ear infection, which has not improved. She also has ear pain and muffled hearing, particularly in the right ear, which has persisted since a COVID infection two and a half years ago. She has not been tested for COVID during this illness.  She has a history of muffled hearing in the right ear since a COVID infection two and a half years ago, which was previously evaluated by an ENT without resolution. She is seeking a new referral for ENT care.  She is currently taking doxycycline , ear drops, and has been using albuterol  as needed. She has a prescription for Xopenex  inhaler. She has not been using Claritin or Allegra recently as she did not feel her symptoms were head-related.        Allergies  Allergen Reactions   Ciprofloxacin  Hcl Other (  See Comments)    Aortic aneurysm per MD   Metoprolol Other (See Comments) and Cough    and wheezing   Penicillins Shortness Of Breath, Rash and Other (See Comments)    Has patient had a PCN reaction causing immediate rash, facial/tongue/throat swelling, SOB or lightheadedness with hypotension: Yes Has patient had a PCN reaction causing severe rash involving mucus membranes or skin  necrosis: No Has patient had a PCN reaction that required hospitalization: Yes - MD office Has patient had a PCN reaction occurring within the last 10 years: No If all of the above answers are NO, then may proceed with Cephalosporin use.    Oxycodone  Other (See Comments)    Nightmares.  Tolerates tramadol    Advair Diskus [Fluticasone -Salmeterol] Anxiety   Fosamax [Alendronate] Other (See Comments)    Other reaction(s): GERD    Immunization History  Administered Date(s) Administered   DTaP 05/09/2006   Fluad Quad(high Dose 65+) 05/05/2019   H1N1 05/26/2008   INFLUENZA, HIGH DOSE SEASONAL PF 05/14/2021   Influenza Split 05/20/2008, 05/29/2009, 04/24/2010, 05/13/2011, 04/20/2012, 05/13/2013   Influenza,inj,Quad PF,6+ Mos 04/15/2016, 03/27/2017, 05/06/2018, 05/10/2019, 05/08/2020   Influenza,inj,quad, With Preservative 04/27/2014, 04/27/2015   Influenza-Unspecified 04/04/2014, 05/06/2018   Moderna Covid-19 Vaccine Bivalent Booster 41yrs & up 05/20/2021   Moderna SARS-COV2 Booster Vaccination 06/06/2020, 12/04/2020   Moderna Sars-Covid-2 Vaccination 09/02/2019, 09/30/2019, 06/05/2020, 12/03/2020   PFIZER Comirnaty(Gray Top)Covid-19 Tri-Sucrose Vaccine 05/22/2023   PNEUMOCOCCAL CONJUGATE-20 09/19/2022   Pneumococcal Conjugate-13 04/04/2014   Pneumococcal Polysaccharide-23 06/06/2009   Pneumococcal-Unspecified 06/06/2009, 04/04/2014   Td 09/10/2018   Tdap 09/10/2018   Zoster, Live 07/11/2009, 03/12/2021, 08/13/2021    Past Medical History:  Diagnosis Date   Abdominal pain 06/06/2019   Anxiety    Aortic aneurysm (HCC) 12/08/2018   Arthritis    HANDS   BCC (basal cell carcinoma) 04/07/1995   right jawline (CX3, 5FU)   BCC (basal cell carcinoma) 05/19/2013   right scalp/post (treatment biopsy)   Chronic constipation    Complication of anesthesia    upon waking up from anesthesia patient experienced chest pain - EKG done was negative, patient states it may have been  indigestion   COPD (chronic obstructive pulmonary disease) (HCC)    DDD (degenerative disc disease), lumbar    L2-3/3-4/4-5   Dizziness 01/17/2019   DOE (dyspnea on exertion) 01/17/2019   Dysplastic nevus 12/04/2009   mod-severe left forearm, exc   Dysuria    Frequency of urination    Ganglion cyst of right groin 11/23/2018   GERD (gastroesophageal reflux disease)    Hearing aid worn    Infected hernioplasty mesh (HCC) 01/26/2019   Keratosis, actinic 02/18/1991   left abdomen   Migraine    Mild emphysema (HCC)    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   Osteopenia 2019   TO OSTEOPOROSIS    Pelvic pain in female    Seroma, post-traumatic (HCC) 04/27/2019   Smokers' cough (HCC)    Thoracic aortic aneurysm (TAA) (HCC)    FOLLOWED BY DR LUCAS  LAST CT CHEST  WAS 6-3 SEE EPIC    Urgency of urination    Wears glasses     Tobacco History: Social History   Tobacco Use  Smoking Status Former   Current packs/day: 0.00   Average packs/day: 0.8 packs/day for 48.0 years (36.0 ttl pk-yrs)   Types: Cigarettes   Start date: 09/04/1969   Quit date: 09/04/2017   Years since quitting: 6.5  Smokeless Tobacco Never   Counseling given: Not Answered  Outpatient Medications Prior to Visit  Medication Sig Dispense Refill   acetaminophen  (TYLENOL ) 500 MG tablet Take 1,000 mg by mouth at bedtime.     Azelastine HCl (ASTEPRO NA) Place 2 puffs into the nose daily.     CALCIUM  CARBONATE ANTACID PO Take 1,000 mg by mouth daily.      carboxymethylcellulose (REFRESH PLUS) 0.5 % SOLN Place 2 drops into both eyes 3 (three) times daily.      Cholecalciferol  (VITAMIN D3 PO) Take 6,000 Units by mouth daily.     Cyanocobalamin  (VITAMIN B-12 PO) Take 1,200 mcg by mouth daily.     docusate sodium  (COLACE) 100 MG capsule Take 200 mg by mouth daily.      esomeprazole (NEXIUM) 20 MG capsule Take 20 mg by mouth daily.     fexofenadine (ALLEGRA ODT) 30 MG disintegrating tablet Take 60 mg by mouth daily.      Fluticasone -Umeclidin-Vilant (TRELEGY ELLIPTA ) 200-62.5-25 MCG/ACT AEPB Inhale 1 puff into the lungs daily. 1 each 11   gabapentin  (NEURONTIN ) 100 MG capsule Take 100 mg by mouth daily.     levalbuterol  (XOPENEX  HFA) 45 MCG/ACT inhaler Inhale 2 puffs into the lungs every 4 (four) hours as needed for wheezing. 1 each 2   LINZESS 72 MCG capsule      LORazepam  (ATIVAN ) 1 MG tablet Take 0.5-1 mg by mouth See admin instructions. Take 0.5 mg in the morning and 1.0 mg at bedtime     Meth-Hyo-M Bl-Na Phos-Ph Sal (URIBEL ) 118 MG CAPS Take 1 capsule (118 mg total) by mouth 4 (four) times daily as needed (bladder pain). 90 capsule 2   NONFORMULARY OR COMPOUNDED ITEM Amitriptyline 2.5%/ gabapentin  2.5%/ baclofen 2.5% in vaginal cream.  Place 1g daily in vaginal/ vulvar area.  Dispense 60g with 5 refills. 60 each 5   polyethylene glycol (MIRALAX  / GLYCOLAX ) packet Take 17 g by mouth daily.      senna (SENOKOT) 8.6 MG TABS tablet Take 2 tablets by mouth at bedtime.     Sodium Chloride -Sodium Bicarb (AYR SALINE NASAL RINSE NA) Place into the nose.     SUMAtriptan  (IMITREX ) 50 MG tablet Take 50 mg by mouth every 2 (two) hours as needed for migraine. May repeat in 2 hours if headache persists or recurs.     traMADol  (ULTRAM ) 50 MG tablet Take 50 mg by mouth every 6 (six) hours as needed for moderate pain.     zoledronic  acid (RECLAST ) 5 MG/100ML SOLN injection Inject 5 mg into the vein.     albuterol  (PROVENTIL ) (2.5 MG/3ML) 0.083% nebulizer solution Take 3 mLs (2.5 mg total) by nebulization every 6 (six) hours as needed for wheezing or shortness of breath. 75 mL 5   No facility-administered medications prior to visit.     Review of Systems:   Constitutional:   No  weight loss, night sweats,  Fevers, chills, +fatigue, or  lassitude.  HEENT:   No headaches,  Difficulty swallowing,  Tooth/dental problems, or  Sore throat,                No sneezing, itching, ear ache,+ nasal congestion, post nasal drip,    CV:  No chest pain,  Orthopnea, PND, swelling in lower extremities, anasarca, dizziness, palpitations, syncope.   GI  No heartburn, indigestion, abdominal pain, nausea, vomiting, diarrhea, change in bowel habits, loss of appetite, bloody stools.   Resp:   No wheezing.  No chest wall deformity  Skin: no rash or lesions.  GU: no  dysuria, change in color of urine, no urgency or frequency.  No flank pain, no hematuria   MS:  No joint pain or swelling.  No decreased range of motion.  No back pain.    Physical Exam  BP 119/65 (BP Location: Left Arm, Patient Position: Sitting, Cuff Size: Normal)   Pulse 79   Ht 5' 9 (1.753 m)   Wt 153 lb (69.4 kg)   SpO2 96%   BMI 22.59 kg/m   GEN: A/Ox3; pleasant , NAD, well nourished    HEENT:  [place]/AT,  EAC clear bilaterally , R TM appear full/mild bulging,  NOSE-clear, THROAT-clear, no lesions, no postnasal drip or exudate noted.   NECK:  Supple w/ fair ROM; no JVD; normal carotid impulses w/o bruits; no thyromegaly or nodules palpated; no lymphadenopathy.    RESP  Clear  P & A; w/o, wheezes/ rales/ or rhonchi. no accessory muscle use, no dullness to percussion  CARD:  RRR, no m/r/g, no peripheral edema, pulses intact, no cyanosis or clubbing.  GI:   Soft & nt; nml bowel sounds; no organomegaly or masses detected.   Musco: Warm bil, no deformities or joint swelling noted.   Neuro: alert, no focal deficits noted.    Skin: Warm, no lesions or rashes    Lab Results:  CBC    Component Value Date/Time   WBC 7.2 01/13/2023 1217   RBC 4.56 01/13/2023 1217   HGB 13.6 01/13/2023 1217   HCT 40.5 01/13/2023 1217   PLT 230 01/13/2023 1217   MCV 88.8 01/13/2023 1217   MCH 29.8 01/13/2023 1217   MCHC 33.6 01/13/2023 1217   RDW 13.1 01/13/2023 1217   LYMPHSABS 1.4 12/24/2021 1309   MONOABS 0.4 12/24/2021 1309   EOSABS 0.1 12/24/2021 1309   BASOSABS 0.0 12/24/2021 1309    BMET    Component Value Date/Time   NA 136 01/13/2023 1217    K 3.8 01/13/2023 1217   CL 104 01/13/2023 1217   CO2 24 01/13/2023 1217   GLUCOSE 105 (H) 01/13/2023 1217   BUN 12 01/13/2023 1217   CREATININE 0.85 01/13/2023 1217   CREATININE 0.93 10/17/2019 1054   CALCIUM  9.3 01/13/2023 1217   GFRNONAA >60 01/13/2023 1217   GFRNONAA 61 10/17/2019 1054   GFRAA 71 10/17/2019 1054    BNP    Component Value Date/Time   BNP 16.9 01/18/2019 1124    ProBNP    Component Value Date/Time   PROBNP 12.0 12/24/2021 1309    Imaging: CT Chest Wo Contrast Result Date: 03/05/2024 CLINICAL DATA:  Chronic dyspnea. Unclear etiology of right middle lobe collapse. EXAM: CT CHEST WITHOUT CONTRAST TECHNIQUE: Multidetector CT imaging of the chest was performed following the standard protocol without IV contrast. RADIATION DOSE REDUCTION: This exam was performed according to the departmental dose-optimization program which includes automated exposure control, adjustment of the mA and/or kV according to patient size and/or use of iterative reconstruction technique. COMPARISON:  01/06/2022 FINDINGS: Cardiovascular: Heart is normal size. Ascending thoracic aorta measures 3.8 cm in AP diameter. There is mild calcified plaque over the descending thoracic aorta which is normal in caliber. Remaining vascular structures are unremarkable. Mediastinum/Nodes: No evidence of mediastinal or hilar adenopathy. Multiple low-density lesions adjacent the neural foramina bilaterally throughout the spine over the posterior mediastinum unchanged and previously evaluated by MRI 2019 compatible with cystic nerve root diverticula as these are unchanged. Remaining mediastinal structures are normal. Lungs/Pleura: Lungs are adequately inflated with stable centrilobular emphysematous disease. Linear density over  the right middle lobe compatible with atelectasis which is improved compared to 11/30/2023 and unchanged from 01/06/2022. No acute airspace process or effusion. 2-3 mm density over the left major  fissure unchanged likely fissural lymph node. Airways are within normal. Upper Abdomen: Calcified plaque over the abdominal aorta. No acute findings. Musculoskeletal: No acute findings. IMPRESSION: 1. No acute cardiopulmonary disease. 2. Stable centrilobular emphysematous disease. 3. Stable linear density over the right middle lobe compatible with atelectasis which is improved compared to 11/30/2023 and unchanged from 01/06/2022. 4. Stable low-density lesions adjacent the neural foramina bilaterally throughout the spine over the posterior mediastinum unchanged and previously evaluated by MRI 2019 compatible with cystic nerve root diverticula . 5. Aortic atherosclerosis. Aortic Atherosclerosis (ICD10-I70.0) and Emphysema (ICD10-J43.9). Electronically Signed   By: Toribio Agreste M.D.   On: 03/05/2024 17:24    Administration History     None          Latest Ref Rng & Units 01/01/2024   10:24 AM  PFT Results  FVC-Pre L 3.38   FVC-Predicted Pre % 101   FVC-Post L 3.35   FVC-Predicted Post % 100   Pre FEV1/FVC % % 63   Post FEV1/FCV % % 67   FEV1-Pre L 2.14   FEV1-Predicted Pre % 85   FEV1-Post L 2.25   DLCO uncorrected ml/min/mmHg 22.42   DLCO UNC% % 100   DLVA Predicted % 100   TLC L 7.24   TLC % Predicted % 124   RV % Predicted % 152     Lab Results  Component Value Date   NITRICOXIDE 9 11/20/2021        Assessment & Plan:   No problem-specific Assessment & Plan notes found for this encounter.  Assessment and Plan Assessment & Plan Right middle lobe scarring and compression (post-mucus plugging) in the setting of COPD exacerbation.  CT scan shows improvement in the right middle lobe, previously collapsed due to possible mucus plugging. Current CT indicates stable atelectasis with probable scarring, similar to 2023. Improvement suggests effective management of mucus clearance. Order CT chest in one year to monitor the condition. Encourage daily use of the flutter valve to aid  mucus clearance and use the nebulizer as needed, especially during periods of increased congestion.  Chronic obstructive pulmonary disease (COPD)   COPD is well-managed with current treatment. Trelegy is used daily, and levalbuterol  is available for exacerbations. Daily use of the flutter valve is recommended to prevent mucus accumulation, which can exacerbate symptoms. Continue Trelegy once daily and use levalbuterol  as needed for exacerbations. Encourage daily use of the flutter valve and refill albuterol  for nebulizer use as needed.  Acute right otitis media and chronic right ear pain with muffled hearing (possible eustachian tube dysfunction)   Presents with acute right otitis media with effusion, chronic ear pain, and muffled hearing, possibly due to eustachian tube dysfunction. Ear infection is treated with doxycycline , but symptoms persist.  Referral to ENT is necessary for further evaluation for ongoing evaluation /second opinion. Finish the current course of doxycycline . Prescribe prednisone  20 mg for 5 days to reduce inflammation and begin Allegra for one week to aid in drainage. Refer to ENT for chronic ear pain and eustachian tube dysfunction.  Acute upper respiratory infection with sinus drainage and sore throat   Acute upper respiratory infection with sinus drainage and sore throat, likely viral. Symptoms include ear pain and sore throat, with drainage contributing to throat irritation. Use nebulizer once daily to prevent mucus accumulation  in the lungs. Monitor symptoms and if not improved by the end of doxycycline  course, follow up with primary care. Supportive care As needed    Plan  Patient Instructions  Finish Doxycycline .  Prednisone  20mg  daily for 5 days.  Begin Allegra 180mg  daily for 1 week and then as needed  Refer to [facility]   Continue on Trelegy 1 puff daily  Xopenex  inhaler or Albuterol  neb As needed    CT chest in  1 year   Follow up with Dr. Kassie in 4 months  and As needed           Madelin Stank, NP 03/28/2024

## 2024-03-28 NOTE — Patient Instructions (Addendum)
 Finish Doxycycline .  Prednisone  20mg  daily for 5 days.  Begin Allegra 180mg  daily for 1 week and then as needed  Refer to [facility]   Continue on Trelegy 1 puff daily  Xopenex  inhaler or Albuterol  neb As needed    CT chest in  1 year   Follow up with Dr. Kassie in 4 months and As needed

## 2024-03-29 ENCOUNTER — Encounter (INDEPENDENT_AMBULATORY_CARE_PROVIDER_SITE_OTHER): Payer: Self-pay

## 2024-03-31 DIAGNOSIS — R519 Headache, unspecified: Secondary | ICD-10-CM | POA: Diagnosis not present

## 2024-03-31 DIAGNOSIS — M9902 Segmental and somatic dysfunction of thoracic region: Secondary | ICD-10-CM | POA: Diagnosis not present

## 2024-03-31 DIAGNOSIS — S29012A Strain of muscle and tendon of back wall of thorax, initial encounter: Secondary | ICD-10-CM | POA: Diagnosis not present

## 2024-03-31 DIAGNOSIS — M9901 Segmental and somatic dysfunction of cervical region: Secondary | ICD-10-CM | POA: Diagnosis not present

## 2024-04-05 ENCOUNTER — Ambulatory Visit
Admission: RE | Admit: 2024-04-05 | Discharge: 2024-04-05 | Disposition: A | Discharge status: Home / Self Care | Source: Ambulatory Visit

## 2024-04-05 DIAGNOSIS — Z1231 Encounter for screening mammogram for malignant neoplasm of breast: Secondary | ICD-10-CM | POA: Diagnosis not present

## 2024-04-07 ENCOUNTER — Other Ambulatory Visit: Admitting: Obstetrics and Gynecology

## 2024-04-07 DIAGNOSIS — N760 Acute vaginitis: Secondary | ICD-10-CM | POA: Diagnosis not present

## 2024-04-07 DIAGNOSIS — R300 Dysuria: Secondary | ICD-10-CM | POA: Diagnosis not present

## 2024-04-07 DIAGNOSIS — N952 Postmenopausal atrophic vaginitis: Secondary | ICD-10-CM | POA: Diagnosis not present

## 2024-04-14 ENCOUNTER — Ambulatory Visit (INDEPENDENT_AMBULATORY_CARE_PROVIDER_SITE_OTHER): Admitting: Physician Assistant

## 2024-04-14 ENCOUNTER — Encounter (INDEPENDENT_AMBULATORY_CARE_PROVIDER_SITE_OTHER): Payer: Self-pay | Admitting: Physician Assistant

## 2024-04-14 VITALS — BP 120/72 | HR 147 | Ht 69.0 in | Wt 153.0 lb

## 2024-04-14 DIAGNOSIS — J0100 Acute maxillary sinusitis, unspecified: Secondary | ICD-10-CM

## 2024-04-14 DIAGNOSIS — H9391 Unspecified disorder of right ear: Secondary | ICD-10-CM

## 2024-04-14 DIAGNOSIS — Z880 Allergy status to penicillin: Secondary | ICD-10-CM | POA: Diagnosis not present

## 2024-04-14 DIAGNOSIS — J329 Chronic sinusitis, unspecified: Secondary | ICD-10-CM

## 2024-04-14 DIAGNOSIS — H938X1 Other specified disorders of right ear: Secondary | ICD-10-CM

## 2024-04-14 MED ORDER — LEVOFLOXACIN 500 MG PO TABS: 500.0000 mg | ORAL_TABLET | Freq: Every day | ORAL | 0 refills | Status: AC

## 2024-04-14 NOTE — Progress Notes (Signed)
 Dear Dr. Orlie, Here is my assessment for our mutual patient, Catherine [name]. Thank you for allowing me the opportunity to care for your patient. Please do not hesitate to contact me should you have any other questions. Sincerely, Chyrl Cohen PA-C  Otolaryngology Clinic Note Referring provider: Dr. Orlie HPI:  Catherine [name] is a 78 y.o. female kindly referred by Dr. Orlie   The patient is a 78 year old female seen in our office for evaluation of ear and sinus related issues.  The patient notes a complicated extensive ear related history.  The patient notes that in June 2023, she saw Dr. Carlie.  She notes prior to that visit she was diagnosed with COVID several months before and had right sided sinus infection.  She was treated with doxycycline , Levaquin  then a azithromycin with 2-3 courses of prednisone .  She had recurrence of her symptoms.  She had several more treatments including Levaquin  and prednisone .  She had persistent right sided ear discomfort.  She had fiberoptic laryngoscopy with no significant abnormalities.  She described muffled hearing on the right side.  It was thought that her symptoms were originating from the right TMJ.  She had persistent symptoms and returned again in October, they proceeded with a CT to evaluate for chronic sinusitis.  They discussed myringotomy at that visit, Dr. Carlie emphasized that tube placement may not relieve her symptoms given her normal tympanograms in July.  Her CT returned showing mild mucosal thickening in various sinuses.  They continue the plan of budesonide irrigations.  With ongoing symptoms the patient wanted to proceed with right myringotomy.  This was performed on 08/21/2022.  She denied any significant changes to the right sided symptoms, continued right sinus sinus symptoms as well.  She had another repeat CT in May 2024 which demonstrated relatively normal sinuses.  She notes some decreased hearing out of the right ear, she had audiological  evaluation which showed right mixed hearing loss with left sensorineural hearing loss, concerned that the tube may be affecting her hearing.  The patient elected to have the tube removed it was removed in May 2024.  Since that time she notes persistent fullness in the right ear and decreased hearing.  She had again repeat audiological evaluation in June 2024 which showed mostly resolution of the right-sided conductive hearing loss and fairly comparable to results in 2023.  Most recently she notes that she had otitis externa approximately 4 weeks ago, she was placed on doxycycline  and Cipro  drops,, she saw her pulmonologist around that time was told she had fluid behind her middle ear, she was given prednisone .  She notes ongoing maxillary sinus problems and postnasal drainage.  She notes minimal seasonal allergies but does have occasional flares.  She has not taking any medications in the last 2 weeks.  Today she notes pain in the right ear, she notes some persistent sinus drainage.  She is currently using Astepro and Nasacort , Allegra at night.  She notes she gets approximately 2 episodes of acute sinusitis a year.  She is allergic to prednisone .     Independent Review of Additional Tests or Records:  ENT office visit note on 01/28/2023, 01/21/2023, 12/17/2022, 12/11/2022, 10/02/2022, 08/21/2022, 06/05/2022, 06/03/2022, 02/19/2022, 01/24/2022   PMH/Meds/All/SocHx/FamHx/ROS:   Past Medical History:  Diagnosis Date   Abdominal pain 06/06/2019   Anxiety    Aortic aneurysm (HCC) 12/08/2018   Arthritis    HANDS   BCC (basal cell carcinoma) 04/07/1995   right jawline (CX3, 5FU)   BCC (  basal cell carcinoma) 05/19/2013   right scalp/post (treatment biopsy)   Chronic constipation    Complication of anesthesia    upon waking up from anesthesia patient experienced chest pain - EKG done was negative, patient states it may have been indigestion   COPD (chronic obstructive pulmonary disease) (HCC)    DDD  (degenerative disc disease), lumbar    L2-3/3-4/4-5   Dizziness 01/17/2019   DOE (dyspnea on exertion) 01/17/2019   Dysplastic nevus 12/04/2009   mod-severe left forearm, exc   Dysuria    Frequency of urination    Ganglion cyst of right groin 11/23/2018   GERD (gastroesophageal reflux disease)    Hearing aid worn    Infected hernioplasty mesh (HCC) 01/26/2019   Keratosis, actinic 02/18/1991   left abdomen   Migraine    Mild emphysema (HCC)    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   Osteopenia 2019   TO OSTEOPOROSIS    Pelvic pain in female    Seroma, post-traumatic (HCC) 04/27/2019   Smokers' cough (HCC)    Thoracic aortic aneurysm (TAA) (HCC)    FOLLOWED BY DR LUCAS  LAST CT CHEST  WAS 6-3 SEE EPIC    Urgency of urination    Wears glasses      Past Surgical History:  Procedure Laterality Date   ABDOMINAL HYSTERECTOMY  1982   W/  UNILATERAL SALPINGOOPHORECTOMY   bilateral hernia surgery      removed ganglion cyst and swollen lymph node    BREAST BIOPSY Right    Years ago- benign   CHOLECYSTECTOMY OPEN  1987   W/  APPENDECTOMY   COLONOSCOPY  2002  &  12-2012 & 03/19/15   CYSTO WITH HYDRODISTENSION N/A 02/20/2014   Procedure: CYSTOSCOPY/HYDRODISTENSION WITH CHLOROPACTIN AND MARCAINE  INSTILLATION;  Surgeon: Alm GORMAN Fragmin, MD;  Location: [facility];  Service: Urology;  Laterality: N/A;   EXCISION MASS LOWER EXTREMETIES Right 11/02/2018   Procedure: REMOVAL OF RIGHT GROIN AND THIGH SUBCUTANEOUS MASSES.;  Surgeon: Sheldon Standing, MD;  Location: [facility];  Service: General;  Laterality: Right;   EYE SURGERY     bilateral cataract surgery    GROIN DISSECTION N/A 02/28/2019   Procedure: QUILLIAN EXPLORATION AND DEBRIDEMENT;  Surgeon: Sheldon Standing, MD;  Location: [facility];  Service: General;  Laterality: N/A;   HERNIA REPAIR     INCISION AND DRAINAGE ABSCESS N/A 12/09/2018   Procedure: INCISION AND DRAINAGE RIGHT GROIN and thigh  WITH DEBRIDEMENT;  Surgeon: Sheldon Standing, MD;  Location: [facility];  Service: General;  Laterality: N/A;   IR CATHETER TUBE CHANGE  05/05/2019   IR RADIOLOGIST EVAL & MGMT  05/03/2019   IR RADIOLOGIST EVAL & MGMT  05/10/2019   KNEE ARTHROSCOPY W/ MENISCAL REPAIR Right 2021   LAPAROSCOPIC ABDOMINAL EXPLORATION Right 02/28/2019   Procedure: LAPAROSCOPIC EXPLORATION WITH REMOVAL OF RIGHT PREPERITONEAL MESH;  Surgeon: Sheldon Standing, MD;  Location: [facility];  Service: General;  Laterality: Right;   LAPAROSCOPIC LYSIS OF ADHESIONS Bilateral 02/28/2019   Procedure: LAPAROSCOPIC LYSIS OF ADHESIONS;  Surgeon: Sheldon Standing, MD;  Location: [facility];  Service: General;  Laterality: Bilateral;   LAPAROSCOPIC UNILATERAL SALPINGOOPHORECTOMY  1984   LAPAROSCOPY N/A 01/24/2021   Procedure: LAPAROSCOPY DIAGNOSTIC;  Surgeon: Sheldon Standing, MD;  Location: [facility];  Service: General;  Laterality: N/A;   LESION REMOVAL Right 02/28/2019   Procedure: EXCISION VAGINAL ABSCESS  AND DISSECTION;  Surgeon: Sheldon Standing, MD;  Location: [facility];  Service: General;  Laterality: Right;   MASS  EXCISION Right 06/16/2018   Procedure: REMOVAL OF RIGHT GROIN SUBCUTANEOUS MASS ERAS PATHWAY;  Surgeon: Sheldon Standing, MD;  Location: [facility];  Service: General;  Laterality: Right;   removal of lymph node cyst in right groin      ROTATOR CUFF REPAIR Right 2003   and BONE SPUR   VENTRAL HERNIA REPAIR N/A 01/24/2021   Procedure: LYSIS OF ADHESIONS, RIGHT INGUINAL AND INCISIONAL HERNIA REPAIR WITH MESH.  TAP BLOCK;  Surgeon: Sheldon Standing, MD;  Location: [facility];  Service: General;  Laterality: N/A;    Family History  Problem Relation Age of Onset   Esophageal cancer Mother 69   Lung cancer Father 29   Breast cancer Maternal Aunt    Bladder Cancer Neg Hx    Uterine cancer Neg Hx    Renal cancer Neg Hx      Social Connections: Not on file      Current Outpatient Medications:    triamcinolone  (NASACORT  ALLERGY 24HR) 55 MCG/ACT AERO nasal inhaler, Place 2 sprays into  the nose daily., Disp: , Rfl:    acetaminophen  (TYLENOL ) 500 MG tablet, Take 1,000 mg by mouth at bedtime., Disp: , Rfl:    albuterol  (PROVENTIL ) (2.5 MG/3ML) 0.083% nebulizer solution, Take 3 mLs (2.5 mg total) by nebulization every 6 (six) hours as needed for wheezing or shortness of breath., Disp: 75 mL, Rfl: 5   Azelastine HCl (ASTEPRO NA), Place 2 puffs into the nose daily., Disp: , Rfl:    CALCIUM  CARBONATE ANTACID PO, Take 1,000 mg by mouth daily. , Disp: , Rfl:    carboxymethylcellulose (REFRESH PLUS) 0.5 % SOLN, Place 2 drops into both eyes 3 (three) times daily. , Disp: , Rfl:    Cholecalciferol  (VITAMIN D3 PO), Take 6,000 Units by mouth daily., Disp: , Rfl:    Cyanocobalamin  (VITAMIN B-12 PO), Take 1,200 mcg by mouth daily., Disp: , Rfl:    docusate sodium  (COLACE) 100 MG capsule, Take 200 mg by mouth daily. , Disp: , Rfl:    esomeprazole (NEXIUM) 20 MG capsule, Take 20 mg by mouth daily., Disp: , Rfl:    fexofenadine (ALLEGRA ODT) 30 MG disintegrating tablet, Take 60 mg by mouth daily., Disp: , Rfl:    Fluticasone -Umeclidin-Vilant (TRELEGY ELLIPTA ) 200-62.5-25 MCG/ACT AEPB, Inhale 1 puff into the lungs daily., Disp: 1 each, Rfl: 11   gabapentin  (NEURONTIN ) 100 MG capsule, Take 100 mg by mouth daily., Disp: , Rfl:    levalbuterol  (XOPENEX  HFA) 45 MCG/ACT inhaler, Inhale 2 puffs into the lungs every 4 (four) hours as needed for wheezing., Disp: 1 each, Rfl: 2   LINZESS 72 MCG capsule, , Disp: , Rfl:    LORazepam  (ATIVAN ) 1 MG tablet, Take 0.5-1 mg by mouth See admin instructions. Take 0.5 mg in the morning and 1.0 mg at bedtime, Disp: , Rfl:    Meth-Hyo-M Bl-Na Phos-Ph Sal (URIBEL ) 118 MG CAPS, Take 1 capsule (118 mg total) by mouth 4 (four) times daily as needed (bladder pain)., Disp: 90 capsule, Rfl: 2   NONFORMULARY OR COMPOUNDED ITEM, Amitriptyline 2.5%/ gabapentin  2.5%/ baclofen 2.5% in vaginal cream.  Place 1g daily in vaginal/ vulvar area.  Dispense 60g with 5 refills., Disp: 60  each, Rfl: 5   polyethylene glycol (MIRALAX  / GLYCOLAX ) packet, Take 17 g by mouth daily. , Disp: , Rfl:    predniSONE  (DELTASONE ) 20 MG tablet, Take 1 tablet (20 mg total) by mouth daily with breakfast., Disp: 5 tablet, Rfl: 0   senna (SENOKOT) 8.6 MG  TABS tablet, Take 2 tablets by mouth at bedtime., Disp: , Rfl:    Sodium Chloride -Sodium Bicarb (AYR SALINE NASAL RINSE NA), Place into the nose., Disp: , Rfl:    SUMAtriptan  (IMITREX ) 50 MG tablet, Take 50 mg by mouth every 2 (two) hours as needed for migraine. May repeat in 2 hours if headache persists or recurs., Disp: , Rfl:    traMADol  (ULTRAM ) 50 MG tablet, Take 50 mg by mouth every 6 (six) hours as needed for moderate pain., Disp: , Rfl:    zoledronic  acid (RECLAST ) 5 MG/100ML SOLN injection, Inject 5 mg into the vein., Disp: , Rfl:    Physical Exam:   BP 120/72   Pulse (!) 147   Ht 5' 9 (1.753 m)   Wt 153 lb (69.4 kg)   SpO2 90%   BMI 22.59 kg/m   Pertinent Findings  CN II-XII intact Bilateral EAC clear and TM intact with well pneumatized middle ear spaces Weber 512: equal Rinne 512: AC > BC b/l  Anterior rhinoscopy: Septum midline; bilateral inferior turbinates with minimal hypertrophy No lesions of oral cavity/oropharynx No obviously palpable neck masses/lymphadenopathy/thyromegaly No respiratory distress or stridor  Seprately Identifiable Procedures:  None  Impression & Plans:  Catherine [name] is a 78 y.o. female with the following   Right ear fullness-  Patient has a longstanding history of right ear fullness, this is unchanged.  She has had numerous evaluations, tympanostomy tubes.  Today was more of a discussion, patient understands that there is limited intervention warranted at this time, she does not want to pursue anything at this time.  She also has some TMJ related issues, she is followed by dental resources and they have recommended some dental interventions, she may pursue this as this would be likely more  fruitful than more ENT interventions.  Sinusitis-  Right sided sinus pressure and pain.  She does have a history of sinus related issues.  She has had several CTs, I am unable to view these but based on Dr. Ilona report she had some mild disease on 1 CT and no significant disease on the other.  Given her reported symptoms today I do not think it is unreasonable to treat with Levaquin  today as she is allergic to amoxicillin.  I discussed the risks and benefits as a relates to antibiotics and in particular Levaquin , she understands this.  Amoxicillin allergy-  Given her reported allergy to amoxicillin when she was young I would recommend following up with allergy testing to verify if she is actually allergic to amoxicillin.    - f/u PRN   Thank you for allowing me the opportunity to care for your patient. Please do not hesitate to contact me should you have any other questions.  Sincerely, Chyrl Cohen PA-C [facility] Phone: [phone] Fax: [phone]  04/14/2024, 3:34 PM

## 2024-04-14 NOTE — Patient Instructions (Signed)
 Allergy testing [phone]

## 2024-04-15 DIAGNOSIS — R519 Headache, unspecified: Secondary | ICD-10-CM | POA: Diagnosis not present

## 2024-04-15 DIAGNOSIS — S29012A Strain of muscle and tendon of back wall of thorax, initial encounter: Secondary | ICD-10-CM | POA: Diagnosis not present

## 2024-04-15 DIAGNOSIS — M9901 Segmental and somatic dysfunction of cervical region: Secondary | ICD-10-CM | POA: Diagnosis not present

## 2024-04-15 DIAGNOSIS — M9902 Segmental and somatic dysfunction of thoracic region: Secondary | ICD-10-CM | POA: Diagnosis not present

## 2024-04-20 ENCOUNTER — Encounter (INDEPENDENT_AMBULATORY_CARE_PROVIDER_SITE_OTHER): Payer: Self-pay

## 2024-04-20 NOTE — Telephone Encounter (Signed)
 Hello,   I am happy to hear that you are not as tired but sorry that you are not back to normal.  I would finish the antibiotics and I look forward to seeing you on Monday.  I do not think prednisone  is a good idea in this situation given the recent use of steroids.

## 2024-04-25 ENCOUNTER — Ambulatory Visit (INDEPENDENT_AMBULATORY_CARE_PROVIDER_SITE_OTHER): Admitting: Physician Assistant

## 2024-04-25 ENCOUNTER — Encounter (INDEPENDENT_AMBULATORY_CARE_PROVIDER_SITE_OTHER): Payer: Self-pay | Admitting: Physician Assistant

## 2024-04-25 VITALS — BP 120/61 | HR 73 | Temp 97.8°F

## 2024-04-25 DIAGNOSIS — H9390 Unspecified disorder of ear, unspecified ear: Secondary | ICD-10-CM | POA: Diagnosis not present

## 2024-04-25 DIAGNOSIS — J0101 Acute recurrent maxillary sinusitis: Secondary | ICD-10-CM

## 2024-04-25 NOTE — Patient Instructions (Signed)
 I have ordered an imaging study for you to complete prior to your next visit. Please call [facility] Scheduling at [phone] to schedule your imaging if you have not received a call within 24 hours. If you are unable to complete your imaging study prior to your next scheduled visit please call our office to let us  know.

## 2024-04-25 NOTE — Progress Notes (Unsigned)
 Dear Dr. Kip, Here is my assessment for our mutual patient, Catherine [name]. Thank you for allowing me the opportunity to care for your patient. Please do not hesitate to contact me should you have any other questions. Sincerely, Chyrl Cohen PA-C  Otolaryngology Clinic Note Referring provider: Dr. Kip HPI:  Catherine [name] is a 78 y.o. female kindly referred by Dr. Kip   The patient is a 78 year old female seen in our office for follow-up evaluation of sinus pressure pain.  The patient was last seen in the office on on 04/14/2024.  Below is a recap of that encounter.  The patient is a 78 year old female seen in our office for evaluation of ear and sinus related issues.  The patient notes a complicated extensive ear related history.  The patient notes that in June 2023, she saw Dr. Carlie.  She notes prior to that visit she was diagnosed with COVID several months before and had right sided sinus infection.  She was treated with doxycycline , Levaquin  then a azithromycin with 2-3 courses of prednisone .  She had recurrence of her symptoms.  She had several more treatments including Levaquin  and prednisone .  She had persistent right sided ear discomfort.  She had fiberoptic laryngoscopy with no significant abnormalities.  She described muffled hearing on the right side.  It was thought that her symptoms were originating from the right TMJ.  She had persistent symptoms and returned again in October, they proceeded with a CT to evaluate for chronic sinusitis.  They discussed myringotomy at that visit, Dr. Carlie emphasized that tube placement may not relieve her symptoms given her normal tympanograms in July.  Her CT returned showing mild mucosal thickening in various sinuses.  They continue the plan of budesonide irrigations.  With ongoing symptoms the patient wanted to proceed with right myringotomy.  This was performed on 08/21/2022.  She denied any significant changes to the right sided symptoms, continued  right sinus sinus symptoms as well.  She had another repeat CT in May 2024 which demonstrated relatively normal sinuses.  She notes some decreased hearing out of the right ear, she had audiological evaluation which showed right mixed hearing loss with left sensorineural hearing loss, concerned that the tube may be affecting her hearing.  The patient elected to have the tube removed it was removed in May 2024.  Since that time she notes persistent fullness in the right ear and decreased hearing.  She had again repeat audiological evaluation in June 2024 which showed mostly resolution of the right-sided conductive hearing loss and fairly comparable to results in 2023.   Most recently she notes that she had otitis externa approximately 4 weeks ago, she was placed on doxycycline  and Cipro  drops,, she saw her pulmonologist around that time was told she had fluid behind her middle ear, she was given prednisone .  She notes ongoing maxillary sinus problems and postnasal drainage.  She notes minimal seasonal allergies but does have occasional flares.  She has not taking any medications in the last 2 weeks.  Today she notes pain in the right ear, she notes some persistent sinus drainage.  She is currently using Astepro and Nasacort , Allegra at night.  She notes she gets approximately 2 episodes of acute sinusitis a year.  She is allergic to prednisone .    Update 04/25/2024  Since her last office visit she notes ongoing issues with right sided jaw ear and sinus related complaints.  She notes she completed the Levaquin  and felt like the infectious symptoms  have resolved but continues to have some pressure and pain in the right maxillary region.  She notes this is worse with eating, it is better when she stops eating on the right side.  She denies any dental related issues.  She notes some ongoing postnasal drainage.   Independent Review of Additional Tests or Records:  None   PMH/Meds/All/SocHx/FamHx/ROS:   Past  Medical History:  Diagnosis Date   Abdominal pain 06/06/2019   Anxiety    Aortic aneurysm (HCC) 12/08/2018   Arthritis    HANDS   BCC (basal cell carcinoma) 04/07/1995   right jawline (CX3, 5FU)   BCC (basal cell carcinoma) 05/19/2013   right scalp/post (treatment biopsy)   Chronic constipation    Complication of anesthesia    upon waking up from anesthesia patient experienced chest pain - EKG done was negative, patient states it may have been indigestion   COPD (chronic obstructive pulmonary disease) (HCC)    DDD (degenerative disc disease), lumbar    L2-3/3-4/4-5   Dizziness 01/17/2019   DOE (dyspnea on exertion) 01/17/2019   Dysplastic nevus 12/04/2009   mod-severe left forearm, exc   Dysuria    Frequency of urination    Ganglion cyst of right groin 11/23/2018   GERD (gastroesophageal reflux disease)    Hearing aid worn    Infected hernioplasty mesh (HCC) 01/26/2019   Keratosis, actinic 02/18/1991   left abdomen   Migraine    Mild emphysema (HCC)    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   Osteopenia 2019   TO OSTEOPOROSIS    Pelvic pain in female    Seroma, post-traumatic (HCC) 04/27/2019   Smokers' cough (HCC)    Thoracic aortic aneurysm (TAA) (HCC)    FOLLOWED BY DR LUCAS  LAST CT CHEST  WAS 6-3 SEE EPIC    Urgency of urination    Wears glasses      Past Surgical History:  Procedure Laterality Date   ABDOMINAL HYSTERECTOMY  1982   W/  UNILATERAL SALPINGOOPHORECTOMY   bilateral hernia surgery      removed ganglion cyst and swollen lymph node    BREAST BIOPSY Right    Years ago- benign   CHOLECYSTECTOMY OPEN  1987   W/  APPENDECTOMY   COLONOSCOPY  2002  &  12-2012 & 03/19/15   CYSTO WITH HYDRODISTENSION N/A 02/20/2014   Procedure: CYSTOSCOPY/HYDRODISTENSION WITH CHLOROPACTIN AND MARCAINE  INSTILLATION;  Surgeon: Alm GORMAN Fragmin, MD;  Location: [facility];  Service: Urology;  Laterality: N/A;   EXCISION MASS LOWER EXTREMETIES Right 11/02/2018    Procedure: REMOVAL OF RIGHT GROIN AND THIGH SUBCUTANEOUS MASSES.;  Surgeon: Sheldon Standing, MD;  Location: [facility];  Service: General;  Laterality: Right;   EYE SURGERY     bilateral cataract surgery    GROIN DISSECTION N/A 02/28/2019   Procedure: QUILLIAN EXPLORATION AND DEBRIDEMENT;  Surgeon: Sheldon Standing, MD;  Location: [facility];  Service: General;  Laterality: N/A;   HERNIA REPAIR     INCISION AND DRAINAGE ABSCESS N/A 12/09/2018   Procedure: INCISION AND DRAINAGE RIGHT GROIN and thigh  WITH DEBRIDEMENT;  Surgeon: Sheldon Standing, MD;  Location: [facility];  Service: General;  Laterality: N/A;   IR CATHETER TUBE CHANGE  05/05/2019   IR RADIOLOGIST EVAL & MGMT  05/03/2019   IR RADIOLOGIST EVAL & MGMT  05/10/2019   KNEE ARTHROSCOPY W/ MENISCAL REPAIR Right 2021   LAPAROSCOPIC ABDOMINAL EXPLORATION Right 02/28/2019   Procedure: LAPAROSCOPIC EXPLORATION WITH REMOVAL OF RIGHT PREPERITONEAL MESH;  Surgeon:  Sheldon Standing, MD;  Location: [facility];  Service: General;  Laterality: Right;   LAPAROSCOPIC LYSIS OF ADHESIONS Bilateral 02/28/2019   Procedure: LAPAROSCOPIC LYSIS OF ADHESIONS;  Surgeon: Sheldon Standing, MD;  Location: [facility];  Service: General;  Laterality: Bilateral;   LAPAROSCOPIC UNILATERAL SALPINGOOPHORECTOMY  1984   LAPAROSCOPY N/A 01/24/2021   Procedure: LAPAROSCOPY DIAGNOSTIC;  Surgeon: Sheldon Standing, MD;  Location: [facility];  Service: General;  Laterality: N/A;   LESION REMOVAL Right 02/28/2019   Procedure: EXCISION VAGINAL ABSCESS  AND DISSECTION;  Surgeon: Sheldon Standing, MD;  Location: [facility];  Service: General;  Laterality: Right;   MASS EXCISION Right 06/16/2018   Procedure: REMOVAL OF RIGHT GROIN SUBCUTANEOUS MASS ERAS PATHWAY;  Surgeon: Sheldon Standing, MD;  Location: [facility];  Service: General;  Laterality: Right;   removal of lymph node cyst in right groin      ROTATOR CUFF REPAIR Right 2003   and BONE SPUR   VENTRAL HERNIA REPAIR N/A 01/24/2021   Procedure: LYSIS OF ADHESIONS, RIGHT  INGUINAL AND INCISIONAL HERNIA REPAIR WITH MESH.  TAP BLOCK;  Surgeon: Sheldon Standing, MD;  Location: [facility];  Service: General;  Laterality: N/A;    Family History  Problem Relation Age of Onset   Esophageal cancer Mother 68   Lung cancer Father 14   Breast cancer Maternal Aunt    Bladder Cancer Neg Hx    Uterine cancer Neg Hx    Renal cancer Neg Hx      Social Connections: Not on file      Current Outpatient Medications:    acetaminophen  (TYLENOL ) 500 MG tablet, Take 1,000 mg by mouth at bedtime., Disp: , Rfl:    albuterol  (PROVENTIL ) (2.5 MG/3ML) 0.083% nebulizer solution, Take 3 mLs (2.5 mg total) by nebulization every 6 (six) hours as needed for wheezing or shortness of breath., Disp: 75 mL, Rfl: 5   Azelastine HCl (ASTEPRO NA), Place 2 puffs into the nose daily., Disp: , Rfl:    CALCIUM  CARBONATE ANTACID PO, Take 1,000 mg by mouth daily. , Disp: , Rfl:    carboxymethylcellulose (REFRESH PLUS) 0.5 % SOLN, Place 2 drops into both eyes 3 (three) times daily. , Disp: , Rfl:    Cholecalciferol  (VITAMIN D3 PO), Take 6,000 Units by mouth daily., Disp: , Rfl:    Cyanocobalamin  (VITAMIN B-12 PO), Take 1,200 mcg by mouth daily., Disp: , Rfl:    docusate sodium  (COLACE) 100 MG capsule, Take 200 mg by mouth daily. , Disp: , Rfl:    esomeprazole (NEXIUM) 20 MG capsule, Take 20 mg by mouth daily., Disp: , Rfl:    fexofenadine (ALLEGRA ODT) 30 MG disintegrating tablet, Take 60 mg by mouth daily., Disp: , Rfl:    Fluticasone -Umeclidin-Vilant (TRELEGY ELLIPTA ) 200-62.5-25 MCG/ACT AEPB, Inhale 1 puff into the lungs daily., Disp: 1 each, Rfl: 11   gabapentin  (NEURONTIN ) 100 MG capsule, Take 100 mg by mouth daily., Disp: , Rfl:    levalbuterol  (XOPENEX  HFA) 45 MCG/ACT inhaler, Inhale 2 puffs into the lungs every 4 (four) hours as needed for wheezing., Disp: 1 each, Rfl: 2   LINZESS 72 MCG capsule, , Disp: , Rfl:    LORazepam  (ATIVAN ) 1 MG tablet, Take 0.5-1 mg by mouth See admin instructions. Take  0.5 mg in the morning and 1.0 mg at bedtime, Disp: , Rfl:    Meth-Hyo-M Bl-Na Phos-Ph Sal (URIBEL ) 118 MG CAPS, Take 1 capsule (118 mg total) by mouth 4 (four) times daily as needed (bladder pain)., Disp:  90 capsule, Rfl: 2   NONFORMULARY OR COMPOUNDED ITEM, Amitriptyline 2.5%/ gabapentin  2.5%/ baclofen 2.5% in vaginal cream.  Place 1g daily in vaginal/ vulvar area.  Dispense 60g with 5 refills., Disp: 60 each, Rfl: 5   polyethylene glycol (MIRALAX  / GLYCOLAX ) packet, Take 17 g by mouth daily. , Disp: , Rfl:    predniSONE  (DELTASONE ) 20 MG tablet, Take 1 tablet (20 mg total) by mouth daily with breakfast., Disp: 5 tablet, Rfl: 0   senna (SENOKOT) 8.6 MG TABS tablet, Take 2 tablets by mouth at bedtime., Disp: , Rfl:    Sodium Chloride -Sodium Bicarb (AYR SALINE NASAL RINSE NA), Place into the nose., Disp: , Rfl:    SUMAtriptan  (IMITREX ) 50 MG tablet, Take 50 mg by mouth every 2 (two) hours as needed for migraine. May repeat in 2 hours if headache persists or recurs., Disp: , Rfl:    traMADol  (ULTRAM ) 50 MG tablet, Take 50 mg by mouth every 6 (six) hours as needed for moderate pain., Disp: , Rfl:    triamcinolone  (NASACORT  ALLERGY 24HR) 55 MCG/ACT AERO nasal inhaler, Place 2 sprays into the nose daily., Disp: , Rfl:    zoledronic  acid (RECLAST ) 5 MG/100ML SOLN injection, Inject 5 mg into the vein., Disp: , Rfl:    Physical Exam:   BP 120/61   Pulse 73   Temp 97.8 F (36.6 C)   SpO2 95%   Pertinent Findings  CN II-XII intact Bilateral EAC clear and TM intact with well pneumatized middle ear spaces Anterior rhinoscopy: Septum midline; bilateral inferior turbinates with minimal hypertrophy No lesions of oral cavity/oropharynx; dentition within normal limits, bridge along the right upper dentition No obvious neck masses/lymphadenopathy/thyromegaly No respiratory distress or stridor  Seprately Identifiable Procedures:  None  Impression & Plans:  Catherine [name] is a 78 y.o. female with the  following   Right ear pain/fullness-  Persistent right sided symptoms, this has been unchanged.  She has a very significant past medical history of right sided ear related issues and has had thorough evaluation and management by Dr. Carlie.  The patient noted today that her original intention when reaching out to our office was to see Dr. Karis, she notes she would like to follow-up with that original plan and have a consultation with him.  Right sided sinus pressure-  Ongoing issues of right sided pressure and pain, she notes it is improved after Levaquin  but has some persistent symptoms.  She notes she has had previous CT scans, these are not available for review.  I do think given the persistent right sided symptoms CT sinus would be appropriate.  She notes that the pain is worse with chewing, I also have suspicion this may be dental related.  I would recommend she follow-up with her dentist as well.  Once the CT is available for review I will call her with the results.   - f/u phone call discussion after CT sinuses complete   Thank you for allowing me the opportunity to care for your patient. Please do not hesitate to contact me should you have any other questions.  Sincerely, Chyrl Cohen PA-C [facility] Phone: [phone] Fax: [phone]  04/25/2024, 9:49 AM

## 2024-05-02 DIAGNOSIS — R519 Headache, unspecified: Secondary | ICD-10-CM | POA: Diagnosis not present

## 2024-05-02 DIAGNOSIS — M9901 Segmental and somatic dysfunction of cervical region: Secondary | ICD-10-CM | POA: Diagnosis not present

## 2024-05-02 DIAGNOSIS — S29012A Strain of muscle and tendon of back wall of thorax, initial encounter: Secondary | ICD-10-CM | POA: Diagnosis not present

## 2024-05-02 DIAGNOSIS — M9902 Segmental and somatic dysfunction of thoracic region: Secondary | ICD-10-CM | POA: Diagnosis not present

## 2024-05-03 ENCOUNTER — Ambulatory Visit (HOSPITAL_BASED_OUTPATIENT_CLINIC_OR_DEPARTMENT_OTHER)
Admission: RE | Admit: 2024-05-03 | Discharge: 2024-05-03 | Disposition: A | Discharge status: Home / Self Care | Source: Ambulatory Visit | Attending: Physician Assistant | Admitting: Physician Assistant

## 2024-05-03 DIAGNOSIS — J0101 Acute recurrent maxillary sinusitis: Secondary | ICD-10-CM | POA: Diagnosis not present

## 2024-05-03 DIAGNOSIS — J342 Deviated nasal septum: Secondary | ICD-10-CM | POA: Diagnosis not present

## 2024-05-03 DIAGNOSIS — J3489 Other specified disorders of nose and nasal sinuses: Secondary | ICD-10-CM | POA: Diagnosis not present

## 2024-05-05 ENCOUNTER — Telehealth (INDEPENDENT_AMBULATORY_CARE_PROVIDER_SITE_OTHER): Payer: Self-pay | Admitting: Physician Assistant

## 2024-05-05 NOTE — Telephone Encounter (Signed)
 I spoke to Catherine [name] about her CT sinuses, very reassuring with no signs of acute or chronic sinus disease.  She notes that she has had more localization to 1 tooth causing pain which is likely the source of her symptoms.  She is planning to reach out to her dentist.  She had no further questions or concerns.

## 2024-05-08 NOTE — Progress Notes (Unsigned)
 New Patient Note  RE: Catherine [name] MRN: [id number] DOB: [birth year] Date of Office Visit: 05/09/2024  Consult requested by: Palmer Purchase, PA-C Primary care provider: Kip Righter, MD  Chief Complaint: No chief complaint on file.  History of Present Illness: I had the pleasure of seeing Catherine [name] for initial evaluation at the [facility] on 05/09/2024. She is a 78 y.o. female, who is referred here by Palmer Purchase, PA-C for the evaluation of penicillin allergy.  Discussed the use of AI scribe software for clinical note transcription with the patient, who gave verbal consent to proceed.  History of Present Illness             Drug reaction: Reaction to *** which occurred *** ago.  Patient was being treated for ***. Symptoms started after taking ***. Symptoms include: *** Symptoms persisted for ***.  Treatment included: ***. Mucosal involvement: {Blank single:19197::yes ***,no}.  Denies any *** fevers, chills, changes in medications, foods, personal care products or recent infections. she has tried the following therapies: *** with *** benefit. Systemic steroids ***.  Previous work up includes: ***. Previous history of rash/hives: ***. Previous history of drug reactions: ***.  Referral note: Penicillin allergy as a child, would like testing to see if still allergic  Assessment and Plan: Catherine [name] is a 78 y.o. female with: ***  Assessment and Plan               No follow-ups on file.  No orders of the defined types were placed in this encounter.  Lab Orders  No laboratory test(s) ordered today    Other allergy screening: Asthma: {Blank single:19197::yes,no} Rhino conjunctivitis: {Blank single:19197::yes,no} Food allergy: {Blank single:19197::yes,no} Medication allergy: {Blank single:19197::yes,no} Hymenoptera allergy: {Blank single:19197::yes,no} Urticaria: {Blank  single:19197::yes,no} Eczema:{Blank single:19197::yes,no} History of recurrent infections suggestive of immunodeficency: {Blank single:19197::yes,no}  Diagnostics: Spirometry:  Tracings reviewed. Her effort: {Blank single:19197::Good reproducible efforts.,It was hard to get consistent efforts and there is a question as to whether this reflects a maximal maneuver.,Poor effort, data can not be interpreted.} FVC: ***L FEV1: ***L, ***% predicted FEV1/FVC ratio: ***% Interpretation: {Blank single:19197::Spirometry consistent with mild obstructive disease,Spirometry consistent with moderate obstructive disease,Spirometry consistent with severe obstructive disease,Spirometry consistent with possible restrictive disease,Spirometry consistent with mixed obstructive and restrictive disease,Spirometry uninterpretable due to technique,Spirometry consistent with normal pattern,No overt abnormalities noted given today's efforts}.  Please see scanned spirometry results for details.  Skin Testing: {Blank single:19197::Select foods,Environmental allergy panel,Environmental allergy panel and select foods,Food allergy panel,None,Deferred due to recent antihistamines use}. *** Results discussed with patient/family.   Past Medical History: Patient Active Problem List   Diagnosis Date Noted  . 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 Overactive bladder 09/22/2023  . SUI (stress urinary incontinence, female) 09/22/2023  . Vaginal atrophy 09/22/2023  . Ear pain, right 03/19/2023  . Chronic rhinitis 04/14/2022  . COVID-19 long hauler 01/16/2022  . COVID-19 virus infection 11/20/2021  . CAP (community acquired pneumonia) 11/20/2021  . COPD GOLD 1 10/16/2021  . Abdominal pain 06/06/2019  . Seroma, post-traumatic 04/27/2019  . Medication monitoring encounter 03/31/2019  . Abscess of groin, right 02/28/2019  . Infected hernioplasty mesh 01/26/2019  . Dizziness  01/17/2019  . DOE (dyspnea on exertion) 01/17/2019  . Mycobacterium fortuitum infection   . Thigh abscess   . Aortic aneurysm 12/08/2018  . Soft tissue infection - Mycobaterium fortuitum   12/08/2018  . Cellulitis of right groin 12/08/2018  . 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 Groin abscess 06/16/2018  .  Anxiety   . Urgency of urination   . DDD (degenerative disc disease), lumbar   . Osteopenia 08/04/2017  . Right sided sciatica 02/18/2017  . Piriformis muscle pain 02/18/2017  . Right groin pain 11/04/2016  . Neuropathy 11/04/2016  . Numbness 11/04/2016  . Depression with anxiety 11/04/2016  . Dysfunction of right eustachian tube 10/30/2015  . Presbycusis of both ears 10/30/2015  . Sensory hearing loss, bilateral 10/30/2015  . Subjective tinnitus of both ears 10/30/2015  . Atypical chest pain 05/08/2014   Past Medical History:  Diagnosis Date  . Abdominal pain 06/06/2019  . Anxiety   . Aortic aneurysm 12/08/2018  . Arthritis    HANDS  . BCC (basal cell carcinoma) 04/07/1995   right jawline (CX3, 5FU)  . BCC (basal cell carcinoma) 05/19/2013   right scalp/post (treatment biopsy)  . Chronic constipation   . Complication of anesthesia    upon waking up from anesthesia patient experienced chest pain - EKG done was negative, patient states it may have been indigestion  . COPD (chronic obstructive pulmonary disease) (HCC)   . DDD (degenerative disc disease), lumbar    L2-3/3-4/4-5  . Dizziness 01/17/2019  . DOE (dyspnea on exertion) 01/17/2019  . Dysplastic nevus 12/04/2009   mod-severe left forearm, exc  . Dysuria   . Frequency of urination   . Ganglion cyst of right groin 11/23/2018  . GERD (gastroesophageal reflux disease)   . Hearing aid worn   . Infected hernioplasty mesh 01/26/2019  . Keratosis, actinic 02/18/1991   left abdomen  . Migraine   . Mild emphysema (HCC)   . 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 Osteopenia 2019   TO OSTEOPOROSIS   .  Pelvic pain in female   . Seroma, post-traumatic 04/27/2019  . Smokers' cough (HCC)   . Thoracic aortic aneurysm (TAA)    FOLLOWED BY DR BARTLE  LAST CT CHEST  WAS 6-3 SEE EPIC   . Urgency of urination   . Wears glasses    Past Surgical History: Past Surgical History:  Procedure Laterality Date  . ABDOMINAL HYSTERECTOMY  1982   W/  UNILATERAL SALPINGOOPHORECTOMY  . bilateral hernia surgery      removed ganglion cyst and swollen lymph node   . BREAST BIOPSY Right    Years ago- benign  . CHOLECYSTECTOMY OPEN  1987   W/  APPENDECTOMY  . COLONOSCOPY  2002  &  12-2012 & 03/19/15  . CYSTO WITH HYDRODISTENSION N/A 02/20/2014   Procedure: CYSTOSCOPY/HYDRODISTENSION WITH CHLOROPACTIN AND MARCAINE  INSTILLATION;  Surgeon: Alm GORMAN Fragmin, MD;  Location: [facility];  Service: Urology;  Laterality: N/A;  . EXCISION MASS LOWER EXTREMETIES Right 11/02/2018   Procedure: REMOVAL OF RIGHT GROIN AND THIGH SUBCUTANEOUS MASSES.;  Surgeon: Sheldon Standing, MD;  Location: [facility];  Service: General;  Laterality: Right;  . EYE SURGERY     bilateral cataract surgery   . GROIN DISSECTION N/A 02/28/2019   Procedure: GROIN EXPLORATION AND DEBRIDEMENT;  Surgeon: Sheldon Standing, MD;  Location: [facility];  Service: General;  Laterality: N/A;  . HERNIA REPAIR    . INCISION AND DRAINAGE ABSCESS N/A 12/09/2018   Procedure: INCISION AND DRAINAGE RIGHT GROIN and thigh  WITH DEBRIDEMENT;  Surgeon: Sheldon Standing, MD;  Location: [facility];  Service: General;  Laterality: N/A;  . IR CATHETER TUBE CHANGE  05/05/2019  . IR RADIOLOGIST EVAL & MGMT  05/03/2019  . IR RADIOLOGIST EVAL & MGMT  05/10/2019  . KNEE ARTHROSCOPY W/ MENISCAL REPAIR  Right 2021  . LAPAROSCOPIC ABDOMINAL EXPLORATION Right 02/28/2019   Procedure: LAPAROSCOPIC EXPLORATION WITH REMOVAL OF RIGHT PREPERITONEAL MESH;  Surgeon: Sheldon Standing, MD;  Location: [facility];  Service: General;  Laterality: Right;  . LAPAROSCOPIC LYSIS OF ADHESIONS Bilateral  02/28/2019   Procedure: LAPAROSCOPIC LYSIS OF ADHESIONS;  Surgeon: Sheldon Standing, MD;  Location: [facility];  Service: General;  Laterality: Bilateral;  . LAPAROSCOPIC UNILATERAL SALPINGOOPHORECTOMY  1984  . LAPAROSCOPY N/A 01/24/2021   Procedure: LAPAROSCOPY DIAGNOSTIC;  Surgeon: Sheldon Standing, MD;  Location: [facility];  Service: General;  Laterality: N/A;  . LESION REMOVAL Right 02/28/2019   Procedure: EXCISION VAGINAL ABSCESS  AND DISSECTION;  Surgeon: Sheldon Standing, MD;  Location: [facility];  Service: General;  Laterality: Right;  . MASS EXCISION Right 06/16/2018   Procedure: REMOVAL OF RIGHT GROIN SUBCUTANEOUS MASS ERAS PATHWAY;  Surgeon: Sheldon Standing, MD;  Location: [facility];  Service: General;  Laterality: Right;  . removal of lymph node cyst in right groin     . ROTATOR CUFF REPAIR Right 2003   and BONE SPUR  . VENTRAL HERNIA REPAIR N/A 01/24/2021   Procedure: LYSIS OF ADHESIONS, RIGHT INGUINAL AND INCISIONAL HERNIA REPAIR WITH MESH.  TAP BLOCK;  Surgeon: Sheldon Standing, MD;  Location: [facility];  Service: General;  Laterality: N/A;   Medication List:  Current Outpatient Medications  Medication Sig Dispense Refill  . acetaminophen  (TYLENOL ) 500 MG tablet Take 1,000 mg by mouth at bedtime.    . albuterol  (PROVENTIL ) (2.5 MG/3ML) 0.083% nebulizer solution Take 3 mLs (2.5 mg total) by nebulization every 6 (six) hours as needed for wheezing or shortness of breath. 75 mL 5  . Azelastine HCl (ASTEPRO NA) Place 2 puffs into the nose daily.    . CALCIUM  CARBONATE ANTACID PO Take 1,000 mg by mouth daily.     . carboxymethylcellulose (REFRESH PLUS) 0.5 % SOLN Place 2 drops into both eyes 3 (three) times daily.     . Cholecalciferol  (VITAMIN D3 PO) Take 6,000 Units by mouth daily.    . Cyanocobalamin  (VITAMIN B-12 PO) Take 1,200 mcg by mouth daily.    . docusate sodium  (COLACE) 100 MG capsule Take 200 mg by mouth daily.     SABRA esomeprazole (NEXIUM) 20 MG capsule Take 20 mg by mouth daily.    . fexofenadine  (ALLEGRA ODT) 30 MG disintegrating tablet Take 60 mg by mouth daily.    . Fluticasone -Umeclidin-Vilant (TRELEGY ELLIPTA ) 200-62.5-25 MCG/ACT AEPB Inhale 1 puff into the lungs daily. 1 each 11  . gabapentin  (NEURONTIN ) 100 MG capsule Take 100 mg by mouth daily.    . levalbuterol  (XOPENEX  HFA) 45 MCG/ACT inhaler Inhale 2 puffs into the lungs every 4 (four) hours as needed for wheezing. 1 each 2  . LINZESS 72 MCG capsule     . LORazepam  (ATIVAN ) 1 MG tablet Take 0.5-1 mg by mouth See admin instructions. Take 0.5 mg in the morning and 1.0 mg at bedtime    . Meth-Hyo-M Bl-Na Phos-Ph Sal (URIBEL ) 118 MG CAPS Take 1 capsule (118 mg total) by mouth 4 (four) times daily as needed (bladder pain). 90 capsule 2  . NONFORMULARY OR COMPOUNDED ITEM Amitriptyline 2.5%/ gabapentin  2.5%/ baclofen 2.5% in vaginal cream.  Place 1g daily in vaginal/ vulvar area.  Dispense 60g with 5 refills. 60 each 5  . polyethylene glycol (MIRALAX  / GLYCOLAX ) packet Take 17 g by mouth daily.     . predniSONE  (DELTASONE ) 20 MG tablet Take 1 tablet (20 mg total)  by mouth daily with breakfast. 5 tablet 0  . senna (SENOKOT) 8.6 MG TABS tablet Take 2 tablets by mouth at bedtime.    . Sodium Chloride -Sodium Bicarb (AYR SALINE NASAL RINSE NA) Place into the nose.    . SUMAtriptan  (IMITREX ) 50 MG tablet Take 50 mg by mouth every 2 (two) hours as needed for migraine. May repeat in 2 hours if headache persists or recurs.    . traMADol  (ULTRAM ) 50 MG tablet Take 50 mg by mouth every 6 (six) hours as needed for moderate pain.    . triamcinolone  (NASACORT  ALLERGY 24HR) 55 MCG/ACT AERO nasal inhaler Place 2 sprays into the nose daily.    . zoledronic  acid (RECLAST ) 5 MG/100ML SOLN injection Inject 5 mg into the vein.     No current facility-administered medications for this visit.   Allergies: Allergies  Allergen Reactions  . Ciprofloxacin  Hcl Other (See Comments)    Aortic aneurysm per MD  . Metoprolol Other (See Comments) and Cough     and wheezing  . Penicillins Shortness Of Breath, Rash and Other (See Comments)    Has patient had a PCN reaction causing immediate rash, facial/tongue/throat swelling, SOB or lightheadedness with hypotension: Yes Has patient had a PCN reaction causing severe rash involving mucus membranes or skin necrosis: No Has patient had a PCN reaction that required hospitalization: Yes - MD office Has patient had a PCN reaction occurring within the last 10 years: No If all of the above answers are NO, then may proceed with Cephalosporin use.   . Oxycodone  Other (See Comments)    Nightmares.  Tolerates tramadol   . Advair Diskus [Fluticasone -Salmeterol] Anxiety  . Fosamax [Alendronate] Other (See Comments)    Other reaction(s): GERD   Social History: Social History   Socioeconomic History  . Marital status: Married    Spouse name: Not on file  . Number of children: Not on file  . Years of education: Not on file  . Highest education level: Not on file  Occupational History  . Not on file  Tobacco Use  . Smoking status: Former    Current packs/day: 0.00    Average packs/day: 0.8 packs/day for 48.0 years (36.0 ttl pk-yrs)    Types: Cigarettes    Start date: 09/04/1969    Quit date: 09/04/2017    Years since quitting: 6.6  . Smokeless tobacco: Never  Vaping Use  . Vaping status: Never Used  Substance and Sexual Activity  . Alcohol  use: No  . Drug use: No  . Sexual activity: Not Currently  Other Topics Concern  . Not on file  Social History Narrative  . Not on file   Social Drivers of Health   Financial Resource Strain: Not on file  Food Insecurity: Low Risk  (04/14/2023)   Received from [facility]   Hunger Vital Sign   . Within the past 12 months, you worried that your food would run out before you got money to buy more: Never true   . Within the past 12 months, the food you bought just didn't last and you didn't have money to get more. : Never true  Transportation Needs: No  Transportation Needs (04/14/2023)   Received from [facility]   . In the past 12 months, has lack of reliable transportation kept you from medical appointments, meetings, work or from getting things needed for daily living? : No  Physical Activity: Not on file  Stress: Not on file  Social Connections:  Not on file   Lives in a ***. Smoking: *** Occupation: ***  Environmental History: Water  Damage/mildew in the house: {Blank single:19197::yes,no} Carpet in the family room: {Blank single:19197::yes,no} Carpet in the bedroom: {Blank single:19197::yes,no} Heating: {Blank single:19197::electric,gas,heat pump} Cooling: {Blank single:19197::central,window,heat pump} Pet: {Blank single:19197::yes ***,no}  Family History: Family History  Problem Relation Age of Onset  . Esophageal cancer Mother 19  . Lung cancer Father 51  . Breast cancer Maternal Aunt   . Bladder Cancer Neg Hx   . Uterine cancer Neg Hx   . Renal cancer Neg Hx    Problem                               Relation Asthma                                   *** Eczema                                *** Food allergy                          *** Allergic rhino conjunctivitis     ***  Review of Systems  Constitutional:  Negative for appetite change, chills, fever and unexpected weight change.  HENT:  Negative for congestion and rhinorrhea.   Eyes:  Negative for itching.  Respiratory:  Negative for cough, chest tightness, shortness of breath and wheezing.   Cardiovascular:  Negative for chest pain.  Gastrointestinal:  Negative for abdominal pain.  Genitourinary:  Negative for difficulty urinating.  Skin:  Negative for rash.  Neurological:  Negative for headaches.    Objective: There were no vitals taken for this visit. There is no height or weight on file to calculate BMI. Physical Exam Vitals and nursing note reviewed.  Constitutional:      Appearance: Normal appearance.  She is well-developed.  HENT:     Head: Normocephalic and atraumatic.     Right Ear: Tympanic membrane and external ear normal.     Left Ear: Tympanic membrane and external ear normal.     Nose: Nose normal.     Mouth/Throat:     Mouth: Mucous membranes are moist.     Pharynx: Oropharynx is clear.  Eyes:     Conjunctiva/sclera: Conjunctivae normal.  Cardiovascular:     Rate and Rhythm: Normal rate and regular rhythm.     Heart sounds: Normal heart sounds. No murmur heard.    No friction rub. No gallop.  Pulmonary:     Effort: Pulmonary effort is normal.     Breath sounds: Normal breath sounds. No wheezing, rhonchi or rales.  Musculoskeletal:     Cervical back: Neck supple.  Skin:    General: Skin is warm.     Findings: No rash.  Neurological:     Mental Status: She is alert and oriented to person, place, and time.  Psychiatric:        Behavior: Behavior normal.   The plan was reviewed with the patient/family, and all questions/concerned were addressed.  It was my pleasure to see Catherine [name] today and participate in her care. Please feel free to contact me with any questions or concerns.  Sincerely,  Orlan Cramp, DO Allergy & Immunology  [facility]  [place] office: [phone] [facility] [place] office: [phone]

## 2024-05-09 ENCOUNTER — Other Ambulatory Visit: Payer: Self-pay

## 2024-05-09 ENCOUNTER — Ambulatory Visit: Admitting: Allergy

## 2024-05-09 ENCOUNTER — Encounter: Payer: Self-pay | Admitting: Allergy

## 2024-05-09 VITALS — BP 116/82 | HR 81 | Temp 98.1°F | Resp 14 | Ht 69.5 in | Wt 151.0 lb

## 2024-05-09 DIAGNOSIS — J449 Chronic obstructive pulmonary disease, unspecified: Secondary | ICD-10-CM

## 2024-05-09 DIAGNOSIS — Z888 Allergy status to other drugs, medicaments and biological substances status: Secondary | ICD-10-CM | POA: Diagnosis not present

## 2024-05-09 DIAGNOSIS — Z880 Allergy status to penicillin: Secondary | ICD-10-CM | POA: Diagnosis not present

## 2024-05-09 DIAGNOSIS — B999 Unspecified infectious disease: Secondary | ICD-10-CM

## 2024-05-09 DIAGNOSIS — Z889 Allergy status to unspecified drugs, medicaments and biological substances status: Secondary | ICD-10-CM

## 2024-05-09 NOTE — Patient Instructions (Addendum)
 Penicillin allergy: Consider penicillin allergy skin testing and in office drug challenge in the future.  Over 90% of people with history of penicillin allergy which occurred over 10 years ago are found to be non-allergic.  You must be off antihistamines for 3-5 days before. Must be in good health and not ill. No vaccines/injections/antibiotics within the past 7 days. Plan on being in the office for 2-3 hours and must bring in the drug you want to do the oral challenge for - will send in prescription to pick up a few days before. You must call to schedule an appointment and specify it's for a drug challenge.   Codes for drug testing: CPT 95018 x 8  skin testing CPT 95076 x 1 - ingestion  Infections Keep track of infections and antibiotics use. Get bloodwork to look at immune system.   Follow up for penicillin testing/challenge.

## 2024-05-11 DIAGNOSIS — E785 Hyperlipidemia, unspecified: Secondary | ICD-10-CM | POA: Diagnosis not present

## 2024-05-11 DIAGNOSIS — E538 Deficiency of other specified B group vitamins: Secondary | ICD-10-CM | POA: Diagnosis not present

## 2024-05-11 DIAGNOSIS — E559 Vitamin D deficiency, unspecified: Secondary | ICD-10-CM | POA: Diagnosis not present

## 2024-05-11 DIAGNOSIS — Z0000 Encounter for general adult medical examination without abnormal findings: Secondary | ICD-10-CM | POA: Diagnosis not present

## 2024-05-12 DIAGNOSIS — Z0000 Encounter for general adult medical examination without abnormal findings: Secondary | ICD-10-CM | POA: Diagnosis not present

## 2024-05-12 DIAGNOSIS — Z23 Encounter for immunization: Secondary | ICD-10-CM | POA: Diagnosis not present

## 2024-05-12 DIAGNOSIS — I10 Essential (primary) hypertension: Secondary | ICD-10-CM | POA: Diagnosis not present

## 2024-05-12 DIAGNOSIS — G43909 Migraine, unspecified, not intractable, without status migrainosus: Secondary | ICD-10-CM | POA: Diagnosis not present

## 2024-05-12 DIAGNOSIS — F331 Major depressive disorder, recurrent, moderate: Secondary | ICD-10-CM | POA: Diagnosis not present

## 2024-05-12 DIAGNOSIS — G479 Sleep disorder, unspecified: Secondary | ICD-10-CM | POA: Diagnosis not present

## 2024-05-12 DIAGNOSIS — E538 Deficiency of other specified B group vitamins: Secondary | ICD-10-CM | POA: Diagnosis not present

## 2024-05-12 DIAGNOSIS — J449 Chronic obstructive pulmonary disease, unspecified: Secondary | ICD-10-CM | POA: Diagnosis not present

## 2024-05-12 DIAGNOSIS — E559 Vitamin D deficiency, unspecified: Secondary | ICD-10-CM | POA: Diagnosis not present

## 2024-05-12 DIAGNOSIS — E785 Hyperlipidemia, unspecified: Secondary | ICD-10-CM | POA: Diagnosis not present

## 2024-05-12 DIAGNOSIS — M810 Age-related osteoporosis without current pathological fracture: Secondary | ICD-10-CM | POA: Diagnosis not present

## 2024-05-12 DIAGNOSIS — F419 Anxiety disorder, unspecified: Secondary | ICD-10-CM | POA: Diagnosis not present

## 2024-05-13 LAB — CBC WITH DIFFERENTIAL/PLATELET
Basophils Absolute: 0 x10E3/uL (ref 0.0–0.2)
Basos: 0 %
EOS (ABSOLUTE): 0.1 x10E3/uL (ref 0.0–0.4)
Eos: 1 %
Hematocrit: 44.2 % (ref 34.0–46.6)
Hemoglobin: 14.3 g/dL (ref 11.1–15.9)
Immature Grans (Abs): 0 x10E3/uL (ref 0.0–0.1)
Immature Granulocytes: 0 %
Lymphocytes Absolute: 1.4 x10E3/uL (ref 0.7–3.1)
Lymphs: 22 %
MCH: 29.9 pg (ref 26.6–33.0)
MCHC: 32.4 g/dL (ref 31.5–35.7)
MCV: 93 fL (ref 79–97)
Monocytes Absolute: 0.5 x10E3/uL (ref 0.1–0.9)
Monocytes: 7 %
Neutrophils Absolute: 4.4 x10E3/uL (ref 1.4–7.0)
Neutrophils: 69 %
Platelets: 236 x10E3/uL (ref 150–450)
RBC: 4.78 x10E6/uL (ref 3.77–5.28)
RDW: 13.2 % (ref 11.7–15.4)
WBC: 6.3 x10E3/uL (ref 3.4–10.8)

## 2024-05-13 LAB — STREP PNEUMONIAE 23 SEROTYPES IGG
Pneumo Ab Type 1*: 3.4 ug/mL (ref >1.3)
Pneumo Ab Type 12 (12F)*: 1.1 ug/mL — AB (ref >1.3)
Pneumo Ab Type 14*: 3 ug/mL (ref >1.3)
Pneumo Ab Type 17 (17F)*: 0.7 ug/mL — AB (ref >1.3)
Pneumo Ab Type 19 (19F)*: 4 ug/mL (ref >1.3)
Pneumo Ab Type 2*: 0.9 ug/mL — AB (ref >1.3)
Pneumo Ab Type 20*: 5.8 ug/mL (ref >1.3)
Pneumo Ab Type 22 (22F)*: 0.7 ug/mL — AB (ref >1.3)
Pneumo Ab Type 23 (23F)*: 4.1 ug/mL (ref >1.3)
Pneumo Ab Type 26 (6B)*: >34 ug/mL (ref >1.3)
Pneumo Ab Type 3*: 0.3 ug/mL — AB (ref >1.3)
Pneumo Ab Type 34 (10A)*: 3.1 ug/mL (ref >1.3)
Pneumo Ab Type 4*: 8.2 ug/mL (ref >1.3)
Pneumo Ab Type 43 (11A)*: 0.4 ug/mL — AB (ref >1.3)
Pneumo Ab Type 5*: 0.1 ug/mL — AB (ref >1.3)
Pneumo Ab Type 51 (7F)*: 2.5 ug/mL (ref >1.3)
Pneumo Ab Type 54 (15B)*: 11.7 ug/mL (ref >1.3)
Pneumo Ab Type 56 (18C)*: 2.6 ug/mL (ref >1.3)
Pneumo Ab Type 57 (19A)*: 0.9 ug/mL — AB (ref >1.3)
Pneumo Ab Type 68 (9V)*: 2.1 ug/mL (ref >1.3)
Pneumo Ab Type 70 (33F)*: 2.7 ug/mL (ref >1.3)
Pneumo Ab Type 8*: 4.4 ug/mL (ref >1.3)
Pneumo Ab Type 9 (9N)*: 1.6 ug/mL (ref >1.3)

## 2024-05-13 LAB — IGG, IGA, IGM
IgA/Immunoglobulin A, Serum: 253 mg/dL (ref 64–422)
IgG (Immunoglobin G), Serum: 790 mg/dL (ref 586–1602)
IgM (Immunoglobulin M), Srm: 53 mg/dL (ref 26–217)

## 2024-05-13 LAB — DIPHTHERIA / TETANUS ANTIBODY PANEL
Diphtheria Ab: 1.35 [IU]/mL (ref <0.10)
Tetanus Ab, IgG: 3.46 [IU]/mL (ref <0.10)

## 2024-05-15 ENCOUNTER — Ambulatory Visit: Payer: Self-pay | Admitting: Allergy

## 2024-05-17 DIAGNOSIS — R519 Headache, unspecified: Secondary | ICD-10-CM | POA: Diagnosis not present

## 2024-05-17 DIAGNOSIS — M9901 Segmental and somatic dysfunction of cervical region: Secondary | ICD-10-CM | POA: Diagnosis not present

## 2024-05-17 DIAGNOSIS — S29012A Strain of muscle and tendon of back wall of thorax, initial encounter: Secondary | ICD-10-CM | POA: Diagnosis not present

## 2024-05-17 DIAGNOSIS — M9902 Segmental and somatic dysfunction of thoracic region: Secondary | ICD-10-CM | POA: Diagnosis not present

## 2024-05-24 ENCOUNTER — Encounter: Payer: Self-pay | Admitting: Allergy

## 2024-05-30 ENCOUNTER — Encounter: Admitting: Allergy

## 2024-06-01 DIAGNOSIS — M9902 Segmental and somatic dysfunction of thoracic region: Secondary | ICD-10-CM | POA: Diagnosis not present

## 2024-06-01 DIAGNOSIS — M9903 Segmental and somatic dysfunction of lumbar region: Secondary | ICD-10-CM | POA: Diagnosis not present

## 2024-06-01 DIAGNOSIS — R519 Headache, unspecified: Secondary | ICD-10-CM | POA: Diagnosis not present

## 2024-06-01 DIAGNOSIS — M9901 Segmental and somatic dysfunction of cervical region: Secondary | ICD-10-CM | POA: Diagnosis not present

## 2024-06-01 DIAGNOSIS — S29012A Strain of muscle and tendon of back wall of thorax, initial encounter: Secondary | ICD-10-CM | POA: Diagnosis not present

## 2024-06-01 DIAGNOSIS — M5417 Radiculopathy, lumbosacral region: Secondary | ICD-10-CM | POA: Diagnosis not present

## 2024-06-08 ENCOUNTER — Encounter (INDEPENDENT_AMBULATORY_CARE_PROVIDER_SITE_OTHER): Payer: Self-pay | Admitting: Otolaryngology

## 2024-06-08 ENCOUNTER — Ambulatory Visit (INDEPENDENT_AMBULATORY_CARE_PROVIDER_SITE_OTHER): Admitting: Otolaryngology

## 2024-06-08 VITALS — BP 124/70 | HR 86 | Temp 97.9°F | Ht 69.0 in | Wt 153.0 lb

## 2024-06-08 DIAGNOSIS — J342 Deviated nasal septum: Secondary | ICD-10-CM

## 2024-06-08 DIAGNOSIS — H6991 Unspecified Eustachian tube disorder, right ear: Secondary | ICD-10-CM

## 2024-06-08 DIAGNOSIS — J343 Hypertrophy of nasal turbinates: Secondary | ICD-10-CM

## 2024-06-08 DIAGNOSIS — H903 Sensorineural hearing loss, bilateral: Secondary | ICD-10-CM

## 2024-06-08 DIAGNOSIS — H9201 Otalgia, right ear: Secondary | ICD-10-CM | POA: Diagnosis not present

## 2024-06-08 DIAGNOSIS — J310 Chronic rhinitis: Secondary | ICD-10-CM

## 2024-06-08 NOTE — Progress Notes (Unsigned)
 Patient ID: Catherine [name], female   DOB: 1946-01-06, 78 y.o.   MRN: [id number]  Follow up:   Discussed the use of AI scribe software for clinical note transcription with the patient, who gave verbal consent to proceed.  History of Present Illness Catherine [name] is a 78 year old female with eustachian tube dysfunction who presents with persistent right ear issues and sinus congestion.  She has a history of eustachian tube dysfunction in her right ear. A tube was previously placed but was removed after a month or two due to distortion of her hearing. Her right ear constantly feels 'stopped up' and she experiences muffled and distorted hearing despite using a hearing aid. She is unable to 'pop' her ear to relieve the sensation. She reports intermittent right ear pain, which may be related to her sinus congestion.  She has frequent sinus infections, primarily affecting the right side. She experiences nasal congestion and drainage, which she associates with her sinus issues. She uses Astepro daily but reports it causes migraines. Steroid nasal sprays like Flonase  and Nasocort also result in migraines.  She experiences frequent migraines and is currently using Trelegy daily, which requires her to rinse her mouth to prevent irritation. She has a history of being allergic to penicillin and is scheduled for allergy testing next week to reassess this allergy.  She has been experiencing a sore throat for a few days and was concerned about oral thrush, but she has only had thrush once before. She uses ibuprofen or Tylenol  for throat discomfort.  She has been evaluated for TMJ issues and was diagnosed with a dislocated disc. She uses a mouthpiece made by her dentist to manage this. She does not experience jaw pain.     Exam: General: Communicates without difficulty, well nourished, no acute distress. Head: Normocephalic, no evidence injury, no tenderness, facial buttresses intact without stepoff.  Face/sinus: No tenderness to palpation and percussion. Facial movement is normal and symmetric. Eyes: PERRL, EOMI. No scleral icterus, conjunctivae clear. Neuro: CN II exam reveals vision grossly intact.  No nystagmus at any point of gaze. Ears: Auricles well formed without lesions.  Ear canals are intact without mass or lesion.  No erythema or edema is appreciated.  The TMs are intact without fluid. Nose: External evaluation reveals normal support and skin without lesions.  Dorsum is intact.  Anterior rhinoscopy reveals congested mucosa over anterior aspect of inferior turbinates and intact septum.  No purulence noted. Oral:  Oral cavity and oropharynx are intact, symmetric, without erythema or edema.  Mucosa is moist without lesions. Neck: Full range of motion without pain.  There is no significant lymphadenopathy.  No masses palpable.  Thyroid  bed within normal limits to palpation.  Parotid glands and submandibular glands equal bilaterally without mass.  Trachea is midline. Neuro:  CN 2-12 grossly intact.    Assessment and Plan Assessment & Plan Right eustachian tube dysfunction with sensorineural hearing loss Chronic right eustachian tube dysfunction with sensorineural hearing loss. Previous tympanostomy tube placement was unsuccessful due to anatomical challenges and hearing distortion. Current symptoms include muffled and distorted hearing in the right ear, with inability to clear the ear despite Valsalva maneuvers. CT scan shows clear middle ear and mastoid cavity, indicating no anatomical obstruction. Steroid nasal sprays are contraindicated due to migraines. - Continue Valsalva maneuvers regularly to attempt to clear the ear. - Avoid further tympanostomy tube placement due to anatomical challenges and lack of expected benefit. - Avoid steroid nasal sprays due  to migraines.  Right-sided nasal congestion due to deviated septum and turbinate hypertrophy Chronic right-sided nasal congestion  attributed to deviated septum and turbinate hypertrophy. CT scan confirms deviated septum and enlarged turbinates. No evidence of sinus infection or polyps. Steroid nasal sprays are contraindicated due to migraines. Ipratropium bromide  nasal spray is considered as an alternative for symptom management. - Prescribed ipratropium bromide  nasal spray for use as needed for nasal drainage. - Advised against the use of antibiotics for sinus infections unless there is clear evidence of bacterial infection, such as pus or high fever. - Scheduled follow-up in six months to reassess symptoms and sinus condition.

## 2024-06-09 DIAGNOSIS — J342 Deviated nasal septum: Secondary | ICD-10-CM | POA: Insufficient documentation

## 2024-06-09 DIAGNOSIS — J343 Hypertrophy of nasal turbinates: Secondary | ICD-10-CM | POA: Insufficient documentation

## 2024-06-09 MED ORDER — IPRATROPIUM BROMIDE 0.06 % NA SOLN: 2.0000 | Freq: Two times a day (BID) | NASAL | 12 refills | Status: AC | PRN

## 2024-06-14 ENCOUNTER — Encounter: Payer: Self-pay | Admitting: Family Medicine

## 2024-06-14 ENCOUNTER — Telehealth: Payer: Self-pay | Admitting: Family Medicine

## 2024-06-14 MED ORDER — AMOXICILLIN 250 MG/5ML PO SUSR: ORAL | 0 refills | Status: AC

## 2024-06-14 NOTE — Telephone Encounter (Signed)
 Pt called and stated she has not received her Amoxicillin and her appt is on date 11/13 which is this Thursday for an  Drug Food Challenge.

## 2024-06-14 NOTE — Telephone Encounter (Signed)
 Handled in pt advise request.

## 2024-06-14 NOTE — Telephone Encounter (Signed)
 Rx for drug challenge sent in.

## 2024-06-15 NOTE — Patient Instructions (Addendum)
 In office amoxicillin oral challenge Catherine [name] was able to tolerate the amoxicillin drug challenge today at the office without adverse signs or symptoms of an allergic reaction. Therefore, she has the same risk of systemic reaction associated with the use of amoxicillin  as the general population.  - Do not give any amoxicillin or penicillin products for the next 24 hours. - Monitor for allergic symptoms such as rash, wheezing, diarrhea, swelling, and vomiting for the next 24 hours. If severe symptoms occur, call 911. For less severe symptoms treat with cetirizine 10 mg once every 12-24 hours and call the clinic.  - Penicillin allergy removed from your allergy list   Call the clinic if this treatment plan is not working well for you  Follow up in 3 months or sooner if needed.

## 2024-06-15 NOTE — Progress Notes (Signed)
 [address] [place] [address] Dept: [phone]  FOLLOW UP NOTE  Patient ID: Catherine [name], female    DOB: 02-Jul-1946  Age: 78 y.o. MRN: [id number] Date of Office Visit: 06/16/2024  Assessment  Chief Complaint: Food/Drug Challenge (Penicillin / Amoxicillin )  HPI Catherine [name] is a 78 year old female who presents to the clinic for penicillin skin testing and possible oral penicillin challenge.  She was initially seen in this clinic on 05/11/2024 by Dr. Luke as a new patient for evaluation of COPD, drug allergy, and recurrent infection.  At today's visit, she reports that she is feeling well overall with no cardiopulmonary, gastrointestinal, or integumentary symptoms.  She has not had any antihistamines over the last 3 days.  Her current medications are listed in the chart.  Drug Allergies:  Allergies  Allergen Reactions   Ciprofloxacin  Hcl Other (See Comments)    Aortic aneurysm per MD   Metoprolol Other (See Comments) and Cough    and wheezing   Oxycodone  Other (See Comments)    Nightmares.  Tolerates tramadol    Advair Diskus [Fluticasone -Salmeterol] Anxiety   Fosamax [Alendronate] Other (See Comments)    Other reaction(s): GERD    Physical Exam: There were no vitals taken for this visit.   Physical Exam Vitals reviewed.  Constitutional:      Appearance: Normal appearance.  HENT:     Head: Normocephalic and atraumatic.     Right Ear: Tympanic membrane normal.     Left Ear: Tympanic membrane normal.     Nose:     Comments: Bilateral nares normal.  Pharynx normal.  Ears normal.  Eyes normal.    Mouth/Throat:     Pharynx: Oropharynx is clear.  Eyes:     Conjunctiva/sclera: Conjunctivae normal.  Cardiovascular:     Rate and Rhythm: Normal rate and regular rhythm.     Heart sounds: Normal heart sounds. No murmur heard. Pulmonary:     Effort: Pulmonary effort is normal.     Breath sounds: Normal breath sounds.     Comments: Lungs clear to  auscultation Musculoskeletal:        General: Normal range of motion.     Cervical back: Normal range of motion and neck supple.  Skin:    General: Skin is warm and dry.  Neurological:     Mental Status: She is alert and oriented to person, place, and time.  Psychiatric:        Mood and Affect: Mood normal.        Behavior: Behavior normal.        Thought Content: Thought content normal.        Judgment: Judgment normal.     Diagnostics:   Percutaneous Penicillin Testing Control SPT: negative Histamine SPT: 3+ Pre-Pen SPT: negative Pen-G SPT: negative  Intradermal Penicillin Testing Control ID: negative  Pre-Pen ID: negative Pen-G (50 units/mL) ID: negative Pen-G (5000 units/mL) ID: negative  Testing was negative, therefore the patient will proceed with a penicillin challenge.  Procedure note:  Written consent obtained  Open graded amoxicillin oral challenge: The patient was able to tolerate the challenge today without adverse signs or symptoms. Vital signs were stable throughout the challenge and observation period. She received 2 doses separated by 30 minutes, each of which was separated by a brief physical exam. She received the following doses: 10% dose and 90% dose for a total of 875 mg of amoxicillin. She was monitored for 60 minutes following the last dose.  Total testing time:96 minutes  The patient had negative sIgE tests to penicillin and Pre-Pen and was able to tolerate the open graded oral challenge today without adverse signs or symptoms. Therefore, she has the same risk of systemic reaction associated with the use of amoxicillin as the general population.   Assessment and Plan: 1. Penicillin allergy     Patient Instructions  In office amoxicillin oral challenge Catherine [name] was able to tolerate the amoxicillin drug challenge today at the office without adverse signs or symptoms of an allergic reaction. Therefore, she has the same risk of systemic reaction  associated with the use of amoxicillin  as the general population.  - Do not give any amoxicillin or penicillin products for the next 24 hours. - Monitor for allergic symptoms such as rash, wheezing, diarrhea, swelling, and vomiting for the next 24 hours. If severe symptoms occur, call 911. For less severe symptoms treat with cetirizine 10 mg once every 12-24 hours and call the clinic.  - Penicillin allergy removed from your allergy list   Call the clinic if this treatment plan is not working well for you  Follow up in 3 months or sooner if needed.   Return in about 3 months (around 09/16/2024), or if symptoms worsen or fail to improve.    Thank you for the opportunity to care for this patient.  Please do not hesitate to contact me with questions.  Arlean Mutter, FNP [facility] 

## 2024-06-16 ENCOUNTER — Encounter: Payer: Self-pay | Admitting: Family Medicine

## 2024-06-16 ENCOUNTER — Other Ambulatory Visit: Payer: Self-pay

## 2024-06-16 ENCOUNTER — Ambulatory Visit (INDEPENDENT_AMBULATORY_CARE_PROVIDER_SITE_OTHER): Admitting: Family Medicine

## 2024-06-16 VITALS — BP 102/82 | HR 82 | Temp 98.7°F | Resp 20

## 2024-06-16 DIAGNOSIS — Z880 Allergy status to penicillin: Secondary | ICD-10-CM | POA: Insufficient documentation

## 2024-06-21 DIAGNOSIS — H43813 Vitreous degeneration, bilateral: Secondary | ICD-10-CM | POA: Diagnosis not present

## 2024-06-21 DIAGNOSIS — Z961 Presence of intraocular lens: Secondary | ICD-10-CM | POA: Diagnosis not present

## 2024-06-21 DIAGNOSIS — H524 Presbyopia: Secondary | ICD-10-CM | POA: Diagnosis not present

## 2024-06-24 NOTE — Addendum Note (Signed)
 Addended by: CELESTIA BIBBER D on: 06/24/2024 02:52 PM   Modules accepted: Orders

## 2024-08-15 ENCOUNTER — Encounter: Payer: Self-pay | Admitting: *Deleted

## 2024-08-20 DIAGNOSIS — R1030 Lower abdominal pain, unspecified: Secondary | ICD-10-CM

## 2024-09-06 ENCOUNTER — Ambulatory Visit (HOSPITAL_BASED_OUTPATIENT_CLINIC_OR_DEPARTMENT_OTHER)

## 2024-09-14 ENCOUNTER — Ambulatory Visit (HOSPITAL_BASED_OUTPATIENT_CLINIC_OR_DEPARTMENT_OTHER)
